# Patient Record
Sex: Female | Born: 1980
Health system: Southern US, Community
[De-identification: ages and names within clinical notes are randomized; demographics above are authoritative.]

## PROBLEM LIST (undated history)

## (undated) ENCOUNTER — Inpatient Hospital Stay (HOSPITAL_COMMUNITY): Payer: Self-pay

## (undated) DIAGNOSIS — T8859XA Other complications of anesthesia, initial encounter: Secondary | ICD-10-CM

## (undated) DIAGNOSIS — R7303 Prediabetes: Secondary | ICD-10-CM

## (undated) DIAGNOSIS — B009 Herpesviral infection, unspecified: Secondary | ICD-10-CM

## (undated) DIAGNOSIS — E559 Vitamin D deficiency, unspecified: Secondary | ICD-10-CM

## (undated) DIAGNOSIS — M1990 Unspecified osteoarthritis, unspecified site: Secondary | ICD-10-CM

## (undated) DIAGNOSIS — F329 Major depressive disorder, single episode, unspecified: Secondary | ICD-10-CM

## (undated) DIAGNOSIS — R059 Cough, unspecified: Secondary | ICD-10-CM

## (undated) DIAGNOSIS — I8390 Asymptomatic varicose veins of unspecified lower extremity: Secondary | ICD-10-CM

## (undated) DIAGNOSIS — F32A Depression, unspecified: Secondary | ICD-10-CM

## (undated) DIAGNOSIS — E739 Lactose intolerance, unspecified: Secondary | ICD-10-CM

## (undated) DIAGNOSIS — G4730 Sleep apnea, unspecified: Secondary | ICD-10-CM

## (undated) DIAGNOSIS — J45909 Unspecified asthma, uncomplicated: Secondary | ICD-10-CM

## (undated) DIAGNOSIS — E282 Polycystic ovarian syndrome: Secondary | ICD-10-CM

## (undated) DIAGNOSIS — IMO0002 Reserved for concepts with insufficient information to code with codable children: Secondary | ICD-10-CM

## (undated) DIAGNOSIS — R5383 Other fatigue: Secondary | ICD-10-CM

## (undated) DIAGNOSIS — F419 Anxiety disorder, unspecified: Secondary | ICD-10-CM

## (undated) DIAGNOSIS — D181 Lymphangioma, any site: Secondary | ICD-10-CM

## (undated) DIAGNOSIS — T4145XA Adverse effect of unspecified anesthetic, initial encounter: Secondary | ICD-10-CM

## (undated) DIAGNOSIS — K219 Gastro-esophageal reflux disease without esophagitis: Secondary | ICD-10-CM

## (undated) HISTORY — DX: Lactose intolerance, unspecified: E73.9

## (undated) HISTORY — DX: Sleep apnea, unspecified: G47.30

## (undated) HISTORY — DX: Prediabetes: R73.03

## (undated) HISTORY — DX: Cough, unspecified: R05.9

## (undated) HISTORY — DX: Major depressive disorder, single episode, unspecified: F32.9

## (undated) HISTORY — DX: Depression, unspecified: F32.A

## (undated) HISTORY — PX: OTHER SURGICAL HISTORY: SHX169

## (undated) HISTORY — DX: Other fatigue: R53.83

## (undated) HISTORY — DX: Vitamin D deficiency, unspecified: E55.9

## (undated) HISTORY — DX: Unspecified asthma, uncomplicated: J45.909

## (undated) HISTORY — DX: Lymphangioma, any site: D18.1

---
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
## 2004-07-14 DIAGNOSIS — IMO0002 Reserved for concepts with insufficient information to code with codable children: Secondary | ICD-10-CM

## 2004-07-14 DIAGNOSIS — R87619 Unspecified abnormal cytological findings in specimens from cervix uteri: Secondary | ICD-10-CM

## 2004-07-14 HISTORY — DX: Reserved for concepts with insufficient information to code with codable children: IMO0002

## 2004-07-14 HISTORY — DX: Unspecified abnormal cytological findings in specimens from cervix uteri: R87.619

## 2004-07-14 HISTORY — PX: LEEP: SHX91

## 2006-12-28 ENCOUNTER — Emergency Department (HOSPITAL_COMMUNITY): Admission: EM | Admit: 2006-12-28 | Discharge: 2006-12-28 | Payer: Self-pay | Admitting: Family Medicine

## 2007-05-04 ENCOUNTER — Ambulatory Visit (HOSPITAL_COMMUNITY): Admission: RE | Admit: 2007-05-04 | Discharge: 2007-05-04 | Payer: Self-pay | Admitting: Obstetrics and Gynecology

## 2007-07-15 HISTORY — PX: HYSTEROSCOPY: SHX211

## 2008-03-03 ENCOUNTER — Ambulatory Visit (HOSPITAL_COMMUNITY): Admission: RE | Admit: 2008-03-03 | Discharge: 2008-03-03 | Payer: Self-pay | Admitting: Obstetrics and Gynecology

## 2008-03-03 ENCOUNTER — Encounter (INDEPENDENT_AMBULATORY_CARE_PROVIDER_SITE_OTHER): Payer: Self-pay | Admitting: Obstetrics and Gynecology

## 2008-07-14 HISTORY — PX: SPHINCTEROTOMY: SHX5279

## 2010-01-30 ENCOUNTER — Inpatient Hospital Stay (HOSPITAL_COMMUNITY): Admission: AD | Admit: 2010-01-30 | Discharge: 2010-01-30 | Payer: Self-pay | Admitting: Obstetrics and Gynecology

## 2010-02-02 ENCOUNTER — Ambulatory Visit (HOSPITAL_COMMUNITY): Admission: AD | Admit: 2010-02-02 | Discharge: 2010-02-02 | Payer: Self-pay | Admitting: Obstetrics and Gynecology

## 2010-02-05 ENCOUNTER — Ambulatory Visit: Payer: Self-pay | Admitting: Obstetrics and Gynecology

## 2010-02-05 ENCOUNTER — Ambulatory Visit (HOSPITAL_COMMUNITY): Admission: RE | Admit: 2010-02-05 | Discharge: 2010-02-05 | Payer: Self-pay | Admitting: Obstetrics and Gynecology

## 2010-02-08 ENCOUNTER — Ambulatory Visit (HOSPITAL_COMMUNITY): Admission: RE | Admit: 2010-02-08 | Discharge: 2010-02-08 | Payer: Self-pay | Admitting: Obstetrics and Gynecology

## 2010-02-11 ENCOUNTER — Ambulatory Visit: Payer: Self-pay | Admitting: Nurse Practitioner

## 2010-02-11 ENCOUNTER — Ambulatory Visit (HOSPITAL_COMMUNITY): Admission: RE | Admit: 2010-02-11 | Discharge: 2010-02-11 | Payer: Self-pay | Admitting: Obstetrics and Gynecology

## 2010-02-18 ENCOUNTER — Ambulatory Visit (HOSPITAL_COMMUNITY): Admission: RE | Admit: 2010-02-18 | Discharge: 2010-02-18 | Payer: Self-pay | Admitting: Obstetrics and Gynecology

## 2010-02-25 ENCOUNTER — Ambulatory Visit: Payer: Self-pay | Admitting: Nurse Practitioner

## 2010-02-25 ENCOUNTER — Inpatient Hospital Stay (HOSPITAL_COMMUNITY): Admission: AD | Admit: 2010-02-25 | Discharge: 2010-02-25 | Payer: Self-pay | Admitting: Obstetrics and Gynecology

## 2010-03-04 ENCOUNTER — Inpatient Hospital Stay (HOSPITAL_COMMUNITY): Admission: AD | Admit: 2010-03-04 | Discharge: 2010-03-04 | Payer: Self-pay | Admitting: Obstetrics and Gynecology

## 2010-03-04 ENCOUNTER — Ambulatory Visit: Payer: Self-pay | Admitting: Nurse Practitioner

## 2010-09-27 LAB — HCG, QUANTITATIVE, PREGNANCY
hCG, Beta Chain, Quant, S: 135 m[IU]/mL — ABNORMAL HIGH (ref <5)
hCG, Beta Chain, Quant, S: 15 m[IU]/mL — ABNORMAL HIGH (ref <5)
hCG, Beta Chain, Quant, S: 2243 m[IU]/mL — ABNORMAL HIGH (ref <5)
hCG, Beta Chain, Quant, S: 455 m[IU]/mL — ABNORMAL HIGH (ref <5)

## 2010-09-28 ENCOUNTER — Emergency Department (HOSPITAL_COMMUNITY)
Admission: EM | Admit: 2010-09-28 | Discharge: 2010-09-28 | Disposition: A | Discharge status: Home / Self Care | Payer: 59 | Attending: Emergency Medicine | Admitting: Emergency Medicine

## 2010-09-28 DIAGNOSIS — M79609 Pain in unspecified limb: Secondary | ICD-10-CM | POA: Insufficient documentation

## 2010-09-28 DIAGNOSIS — L539 Erythematous condition, unspecified: Secondary | ICD-10-CM | POA: Insufficient documentation

## 2010-09-28 LAB — DIFFERENTIAL
Basophils Absolute: 0 10*3/uL (ref 0.0–0.1)
Basophils Absolute: 0 10*3/uL (ref 0.0–0.1)
Eosinophils Absolute: 0.1 10*3/uL (ref 0.0–0.7)
Eosinophils Relative: 1 % (ref 0–5)
Eosinophils Relative: 2 % (ref 0–5)
Lymphocytes Relative: 26 % (ref 12–46)
Lymphs Abs: 2.1 10*3/uL (ref 0.7–4.0)
Monocytes Absolute: 0.3 10*3/uL (ref 0.1–1.0)
Monocytes Absolute: 0.3 10*3/uL (ref 0.1–1.0)
Monocytes Relative: 3 % (ref 3–12)
Monocytes Relative: 5 % (ref 3–12)
Myelocytes: 0 %
Neutro Abs: 3.3 10*3/uL (ref 1.7–7.7)
Neutrophils Relative %: 69 % (ref 43–77)

## 2010-09-28 LAB — CBC
MCH: 30.2 pg (ref 26.0–34.0)
MCH: 31.2 pg (ref 26.0–34.0)
MCV: 92.4 fL (ref 78.0–100.0)
MCV: 93.3 fL (ref 78.0–100.0)
Platelets: 307 10*3/uL (ref 150–400)
Platelets: 313 10*3/uL (ref 150–400)
RBC: 3.81 MIL/uL — ABNORMAL LOW (ref 3.87–5.11)
RBC: 3.96 MIL/uL (ref 3.87–5.11)
WBC: 6.3 10*3/uL (ref 4.0–10.5)
WBC: 8 10*3/uL (ref 4.0–10.5)

## 2010-09-28 LAB — HCG, QUANTITATIVE, PREGNANCY
hCG, Beta Chain, Quant, S: 3422 m[IU]/mL — ABNORMAL HIGH (ref <5)
hCG, Beta Chain, Quant, S: 3506 m[IU]/mL — ABNORMAL HIGH (ref <5)
hCG, Beta Chain, Quant, S: 4339 m[IU]/mL — ABNORMAL HIGH (ref <5)

## 2010-09-28 LAB — CREATININE, SERUM
Creatinine, Ser: 0.62 mg/dL (ref 0.4–1.2)
GFR calc Af Amer: >60 mL/min (ref >60)
GFR calc non Af Amer: >60 mL/min (ref >60)
GFR calc non Af Amer: >60 mL/min (ref >60)

## 2010-09-28 LAB — ABO/RH: ABO/RH(D): A POS

## 2010-09-28 LAB — AST: AST: 12 U/L (ref 0–37)

## 2010-10-01 ENCOUNTER — Emergency Department (HOSPITAL_COMMUNITY)
Admission: EM | Admit: 2010-10-01 | Discharge: 2010-10-01 | Disposition: A | Discharge status: Home / Self Care | Payer: 59 | Attending: Emergency Medicine | Admitting: Emergency Medicine

## 2010-10-01 DIAGNOSIS — L03119 Cellulitis of unspecified part of limb: Secondary | ICD-10-CM | POA: Insufficient documentation

## 2010-10-01 DIAGNOSIS — R509 Fever, unspecified: Secondary | ICD-10-CM | POA: Insufficient documentation

## 2010-10-01 DIAGNOSIS — R21 Rash and other nonspecific skin eruption: Secondary | ICD-10-CM | POA: Insufficient documentation

## 2010-10-01 DIAGNOSIS — L02419 Cutaneous abscess of limb, unspecified: Secondary | ICD-10-CM | POA: Insufficient documentation

## 2010-10-03 LAB — WOUND CULTURE

## 2010-11-26 NOTE — Op Note (Signed)
NAMEFRANCHON, Brenda Williams              ACCOUNT NO.:  [id number]   MEDICAL RECORD NO.:  [id number]          PATIENT TYPE:  AMB   LOCATION:  SDC                           FACILITY:  [facility]   PHYSICIAN:  Miguel Aschoff, M.D.       DATE OF BIRTH:  08-28-1980   DATE OF PROCEDURE:  DATE OF DISCHARGE:                               OPERATIVE REPORT   PREOPERATIVE DIAGNOSES:  Irregular vaginal bleeding, endometrial polyp.   POSTOPERATIVE DIAGNOSES:  Irregular vaginal bleeding, endometrial polyp.   PROCEDURES:  Cervical dilatation, hysteroscopy, removal of endometrial  polyp, uterine curettage.   SURGEON:  Miguel Aschoff, MD   ANESTHESIA:  General.   COMPLICATIONS:  None.   JUSTIFICATION:  The patient is a 30 year old black female patient of Dr.  Teodora Medici who presented for evaluation of irregular vaginal bleeding.  On evaluation, the patient was noted to have a polyp in the uterus and  sonohysterogram and because of the findings, she presents now to undergo  hysteroscopy, D&C, and removal of the endometrial polyp.  The risks and  benefits of the procedure were discussed with the patient and informed  consent has been obtained.   PROCEDURE:  The patient was taken to the operating room, placed in  supine position, general anesthesia was administered without difficulty.  She was then placed in a dorsal lithotomy position, prepped and draped  in the usual sterile fashion.  Bladder was catheterized.  Examination  under anesthesia revealed normal external genitalia, normal Bartholin  and Skene's glands,  normal urethra.  The vaginal vault was without  gross lesion.  The cervix had scarring present due to a prior cervical  LEEP procedure.  The uterus was noted be anterior in normal size and  shape.  No adnexal masses were noted.  At this point, speculum was  placed in the vaginal vault.  Anterior cervical lip was grasped with a  tenaculum and then the cervix was carefully dilated using serial Pratt  dilators until #25 Pratt dilator could be passed and dilated the cervix.  This corrected the stenosis associated with prior LEEP procedure.  Once  the dilatation was complete, the diagnostic hysteroscope was advanced  through the endocervix.  No endocervical lesions were noted.  On  entering the uterine cavity, a polyp was noted to be originating from  the lower left posterior portion of the uterine cavity.  The remainder  of the uterine cavity was unremarkable.  At this point, the hysteroscope  was removed.  Polyp forceps were introduced and the polyp was removed.  The hysteroscope was then reintroduced.  A small portion of the base of  polyp remained and at this point hysteroscope was taken out and  curettage in this area was carried out without difficulty.  This tissue  was collected and sent for histologic study.  Replacement hysteroscope  at this point revealed complete removal of the polyp and no other  abnormalities being noted.  It was elected to complete the procedure.  The hysteroscope was removed.  The cervix was injected with 10 mL of 1%  Xylocaine for analgesia.  The  patient was reversed from the anesthetic  and taken to the recovery room in satisfactory condition.  The fluid  deficit on hysteroscopy was minimal.  The estimated blood loss was less  than 20 mL.   PLAN:  The patient is to be discharged home.   MEDICATIONS:  For home include;  1. Darvocet-N 100 one every 4 hours as needed for pain.  2. Doxycycline 1 twice a day times 3 days.   The patient is call for any problems such as fever, pain, or heavy  bleeding.  She is to call in 1 week for pathology report.  She will be  seen back in 4 weeks for followup examination and then return to the  care of Dr. Chevis Pretty.      Miguel Aschoff, M.D.  Electronically Signed     AR/MEDQ  D:  03/03/2008  T:  03/04/2008  Job:  [id number]   cc:   Brenda Williams, M.D.  Fax: [phone]

## 2011-03-13 ENCOUNTER — Inpatient Hospital Stay (HOSPITAL_COMMUNITY)
Admission: AD | Admit: 2011-03-13 | Discharge: 2011-03-13 | Disposition: A | Discharge status: Home / Self Care | Payer: 59 | Source: Ambulatory Visit | Attending: Obstetrics and Gynecology | Admitting: Obstetrics and Gynecology

## 2011-03-15 ENCOUNTER — Encounter (HOSPITAL_COMMUNITY): Payer: Self-pay

## 2011-03-15 ENCOUNTER — Inpatient Hospital Stay (HOSPITAL_COMMUNITY)
Admission: AD | Admit: 2011-03-15 | Discharge: 2011-03-15 | Disposition: A | Discharge status: Home / Self Care | Payer: 59 | Source: Ambulatory Visit | Attending: Obstetrics and Gynecology | Admitting: Obstetrics and Gynecology

## 2011-03-15 DIAGNOSIS — O34599 Maternal care for other abnormalities of gravid uterus, unspecified trimester: Secondary | ICD-10-CM | POA: Insufficient documentation

## 2011-03-15 DIAGNOSIS — E282 Polycystic ovarian syndrome: Secondary | ICD-10-CM | POA: Insufficient documentation

## 2011-03-15 DIAGNOSIS — O209 Hemorrhage in early pregnancy, unspecified: Secondary | ICD-10-CM

## 2011-03-15 HISTORY — DX: Reserved for concepts with insufficient information to code with codable children: IMO0002

## 2011-03-15 HISTORY — DX: Herpesviral infection, unspecified: B00.9

## 2011-03-15 HISTORY — DX: Polycystic ovarian syndrome: E28.2

## 2011-03-15 LAB — URINALYSIS, ROUTINE W REFLEX MICROSCOPIC
Glucose, UA: NEGATIVE mg/dL
Ketones, ur: NEGATIVE mg/dL
Nitrite: NEGATIVE
Specific Gravity, Urine: 1.025 (ref 1.005–1.030)
Urobilinogen, UA: 0.2 mg/dL (ref 0.0–1.0)
pH: 6 (ref 5.0–8.0)

## 2011-03-15 LAB — WET PREP, GENITAL

## 2011-03-15 NOTE — Progress Notes (Signed)
Found out pregnant 4 days positive home test and confirmed by blood test at Dr. Huel Coventry office, two days later started spotting, LMP 02/13/11, still spotting today, lower back pain, was dark brown today light pinkish spottingl

## 2011-03-15 NOTE — ED Provider Notes (Signed)
Chief Complaint:  Vaginal Bleeding and Back Pain   Brenda Williams is  30 y.o. G2P0010.  Patient's last menstrual period was 02/13/2011.Marland Kitchen  Her pregnancy status is positive.  She presents complaining of Vaginal Bleeding and Back Pain . Onset is described as ongoing and has been present for  1 days. C/O some back pain and lower abdominal "pulling, discomfort"  OB History    Grav Para Term Preterm Abortions TAB SAB Ect Mult Living   2 0 0 0 1 0 0 1 0 0        Past Medical History  Diagnosis Date  . Herpes     never had outbreak. pos per blood, doesn't know which type  . Polycystic ovarian syndrome   . Abnormal Pap smear 2006    leep    Past Surgical History  Procedure Date  . Knee surgery 1998    right knee  . Leep 2006  . Hysteroscopy 2009    uterine polyp  . Sphincterotomy 2010    No family history on file.  History  Substance Use Topics  . Smoking status: Never Smoker   . Smokeless tobacco: Not on file  . Alcohol Use: No    Allergies: No Known Allergies  Prescriptions prior to admission  Medication Sig Dispense Refill  . prenatal vitamin w/FE, FA (PRENATAL 1 + 1) 27-1 MG TABS Take 1 tablet by mouth daily.          Review of Systems - Negative except above  Physical Exam   Blood pressure 118/67, pulse 83, temperature 98.7 F (37.1 C), temperature source Oral, height 5\' 7"  (1.702 m), weight 98.793 kg (217 lb 12.8 oz), last menstrual period 02/13/2011, unknown if currently breastfeeding.  General: General appearance - alert, well appearing, and in no distress Chest - clear to auscultation, no wheezes, rales or rhonchi, symmetric air entry Heart - normal rate and regular rhythm Abdomen - soft, nontender, nondistended, no masses or organomegaly no rebound tenderness noted Focused Gynecological Exam: normal external genitalia, vulva, vagina, cervix; normal appearing discharge.  CX non friable.  Small amt dark red blood in vault.  Mildly tender across lower  abdomen  Labs: Recent Results (from the past 24 hour(s))  URINALYSIS, ROUTINE W REFLEX MICROSCOPIC   Collection Time   03/15/11  3:40 PM      Component Value Range   Color, Urine YELLOW  YELLOW    Appearance CLEAR  CLEAR    Specific Gravity, Urine 1.025  1.005 - 1.030    pH 6.0  5.0 - 8.0    Glucose, UA NEGATIVE  NEGATIVE (mg/dL)   Hgb urine dipstick NEGATIVE  NEGATIVE    Bilirubin Urine NEGATIVE  NEGATIVE    Ketones, ur NEGATIVE  NEGATIVE (mg/dL)   Protein, ur NEGATIVE  NEGATIVE (mg/dL)   Urobilinogen, UA 0.2  0.0 - 1.0 (mg/dL)   Nitrite NEGATIVE  NEGATIVE    Leukocytes, UA NEGATIVE  NEGATIVE   POCT PREGNANCY, URINE   Collection Time   03/15/11  4:18 PM      Component Value Range   Preg Test, Ur POSITIVE     Imaging Studies:  No results found.   Assessment: There is no problem list on file for this patient.   Plan: Dr. Marcelle Overlie given report on pt. Wants to be called with [facility] result  CRESENZO-DISHMAN,FRANCES

## 2011-03-15 NOTE — ED Provider Notes (Signed)
Hcg 85.  Pt states is was 22 a few days ago.  Dr. Marcelle Overlie called.  Pt to f/u Tuesday for Qhcg.  To f/u sooner here if pain increases

## 2011-03-18 LAB — GC/CHLAMYDIA PROBE AMP, GENITAL
Chlamydia, DNA Probe: NEGATIVE
GC Probe Amp, Genital: NEGATIVE

## 2011-05-02 ENCOUNTER — Inpatient Hospital Stay (HOSPITAL_COMMUNITY)
Admission: AD | Admit: 2011-05-02 | Discharge: 2011-05-02 | Disposition: A | Discharge status: Home / Self Care | Payer: BC Managed Care – PPO | Source: Ambulatory Visit | Attending: Obstetrics & Gynecology | Admitting: Obstetrics & Gynecology

## 2011-05-02 ENCOUNTER — Encounter (HOSPITAL_COMMUNITY): Payer: Self-pay

## 2011-05-02 DIAGNOSIS — O26899 Other specified pregnancy related conditions, unspecified trimester: Secondary | ICD-10-CM

## 2011-05-02 DIAGNOSIS — O99891 Other specified diseases and conditions complicating pregnancy: Secondary | ICD-10-CM | POA: Insufficient documentation

## 2011-05-02 DIAGNOSIS — M545 Low back pain, unspecified: Secondary | ICD-10-CM | POA: Insufficient documentation

## 2011-05-02 DIAGNOSIS — M549 Dorsalgia, unspecified: Secondary | ICD-10-CM

## 2011-05-02 LAB — URINALYSIS, ROUTINE W REFLEX MICROSCOPIC
Bilirubin Urine: NEGATIVE
Hgb urine dipstick: NEGATIVE
Nitrite: NEGATIVE
Protein, ur: NEGATIVE mg/dL
Specific Gravity, Urine: 1.025 (ref 1.005–1.030)
Urobilinogen, UA: 0.2 mg/dL (ref 0.0–1.0)

## 2011-05-02 LAB — WET PREP, GENITAL: Clue Cells Wet Prep HPF POC: NONE SEEN

## 2011-05-02 NOTE — ED Provider Notes (Signed)
History     CSN: [id number] Arrival date & time: 05/02/2011  8:13 AM   None     Chief Complaint  Patient presents with  . Back Pain    HPI Brenda Williams is a 30 y.o. female @ [redacted] weeks gestation who presents to MAU for vaginal discharge and low back pain that started last night. She did a lot of walking yesterday before the pain started. Early prenatal care with [facility] but no longer goes there. Had pregnancy test and Bhcg's early due to history of cervical ectopic. Had ultrasound in the office that puts due date at Nov 21, 2011. Taking Reglan for nausea.   Past Medical History  Diagnosis Date  . Herpes     never had outbreak. pos per blood, doesn't know which type  . Polycystic ovarian syndrome   . Abnormal Pap smear 2006    leep    Past Surgical History  Procedure Date  . Knee surgery 1998    right knee  . Leep 2006  . Hysteroscopy 2009    uterine polyp  . Sphincterotomy 2010    No family history on file.  History  Substance Use Topics  . Smoking status: Never Smoker   . Smokeless tobacco: Never Used  . Alcohol Use: No    OB History    Grav Para Term Preterm Abortions TAB SAB Ect Mult Living   2 0 0 0 1 0 0 1 0 0       Review of Systems  Constitutional: Positive for fatigue. Negative for fever, chills and diaphoresis.  HENT: Negative for ear pain, congestion, sore throat, facial swelling, neck pain, neck stiffness, dental problem and sinus pressure.   Eyes: Negative for photophobia, pain and discharge.  Respiratory: Negative for cough, chest tightness and wheezing.   Cardiovascular: Negative.   Gastrointestinal: Positive for nausea. Negative for vomiting, abdominal pain, diarrhea, constipation and abdominal distention.  Genitourinary: Negative for dysuria, frequency, flank pain and difficulty urinating.  Musculoskeletal: Positive for back pain. Negative for myalgias and gait problem.  Skin: Negative for color change and rash.  Neurological:  Negative for dizziness, speech difficulty, weakness, light-headedness, numbness and headaches.  Psychiatric/Behavioral: Negative for confusion and agitation.    Allergies  Review of patient's allergies indicates no known allergies.  Home Medications  No current outpatient prescriptions on file.  BP 109/68  Pulse 70  Temp(Src) 98.7 F (37.1 C) (Oral)  Resp 20  Ht 5\' 7"  (1.702 m)  Wt 225 lb (102.059 kg)  BMI 35.24 kg/m2  SpO2 98%  LMP 02/13/2011  Breastfeeding? Unknown  Physical Exam  Nursing note and vitals reviewed. Constitutional: She is oriented to person, place, and time. She appears well-developed and well-nourished.  HENT:  Head: Normocephalic and atraumatic.  Eyes: EOM are normal.  Neck: Normal range of motion. Neck supple.  Cardiovascular: Normal rate.   Pulmonary/Chest: Effort normal.  Abdominal: Soft. There is no tenderness.       Doppler FHT 150 bpm  Genitourinary:       Creamy discharge vaginal vault. Cervix closed, long, no CMT, no adnexal tenderness, uterus approximately 12 week size.   Musculoskeletal:       Pain in lumbar area with range of motion.   Neurological: She is alert and oriented to person, place, and time. She has normal strength and normal reflexes. No cranial nerve deficit or sensory deficit.       Good grips and equal bilaterally.  Skin: Skin is warm and  dry.   Blood type A positive Results for orders placed during the hospital encounter of 05/02/11 (from the past 24 hour(s))  URINALYSIS, ROUTINE W REFLEX MICROSCOPIC     Status: Normal   Collection Time   05/02/11  8:30 AM      Component Value Range   Color, Urine YELLOW  YELLOW    Appearance CLEAR  CLEAR    Specific Gravity, Urine 1.025  1.005 - 1.030    pH 7.0  5.0 - 8.0    Glucose, UA NEGATIVE  NEGATIVE (mg/dL)   Hgb urine dipstick NEGATIVE  NEGATIVE    Bilirubin Urine NEGATIVE  NEGATIVE    Ketones, ur NEGATIVE  NEGATIVE (mg/dL)   Protein, ur NEGATIVE  NEGATIVE (mg/dL)    Urobilinogen, UA 0.2  0.0 - 1.0 (mg/dL)   Nitrite NEGATIVE  NEGATIVE    Leukocytes, UA NEGATIVE  NEGATIVE   WET PREP, GENITAL     Status: Abnormal   Collection Time   05/02/11  9:54 AM      Component Value Range   Yeast, Wet Prep NONE SEEN  NONE SEEN    Trich, Wet Prep NONE SEEN  NONE SEEN    Clue Cells, Wet Prep NONE SEEN  NONE SEEN    WBC, Wet Prep HPF POC MODERATE (*) NONE SEEN    ED Course  Procedures  Informal ultrasound = active fetus with cardiac activity visualized.   Assessment:  Low back strain    Pregnancy  Plan:   Tylenol, heat, rest    Follow up with [facility] for prenatal care.    MDM          Kerrie Buffalo, NP 05/02/11 1026

## 2011-05-02 NOTE — ED Provider Notes (Signed)
Agree with above note.  LEGGETT,KELLY H. 05/02/2011 10:59 AM

## 2011-05-02 NOTE — Progress Notes (Signed)
Pt states she had a small amount of pink discharge on the tissue after urinating this am. Has a low back ache.

## 2011-05-02 NOTE — Progress Notes (Signed)
Pt states back pain began last pm, denies burning or urgency with voiding. Has had pink vaginal d/c, today was thicker and orange color. Non-odorous.

## 2011-05-03 LAB — GC/CHLAMYDIA PROBE AMP, GENITAL: Chlamydia, DNA Probe: NEGATIVE

## 2011-05-05 ENCOUNTER — Inpatient Hospital Stay (HOSPITAL_COMMUNITY)
Admission: AD | Admit: 2011-05-05 | Discharge: 2011-05-05 | Disposition: A | Discharge status: Home / Self Care | Payer: BC Managed Care – PPO | Source: Ambulatory Visit | Attending: Obstetrics & Gynecology | Admitting: Obstetrics & Gynecology

## 2011-05-05 ENCOUNTER — Encounter (HOSPITAL_COMMUNITY): Payer: Self-pay | Admitting: *Deleted

## 2011-05-05 DIAGNOSIS — O26859 Spotting complicating pregnancy, unspecified trimester: Secondary | ICD-10-CM | POA: Diagnosis present

## 2011-05-05 NOTE — Progress Notes (Signed)
Was here  Fri morning( spotting), everything was fine.  Started having bright red bleeding on Fri,  Brownish on Sat.   Passed a clot on Sat, brownish yesterday and passed another clot this morning.  quartersized.

## 2011-05-05 NOTE — ED Provider Notes (Signed)
History     Chief Complaint  Patient presents with  . Vaginal Bleeding   HPI Seen in MAU for spotting on Thursday, heavier like a period on Friday, passed half dollar sized clot on Saturday, spotting on Sunday, passed another clot this morning, spotting continues.   OB History    Grav Para Term Preterm Abortions TAB SAB Ect Mult Living   2 0 0 0 1 0 0 1 0 0       Past Medical History  Diagnosis Date  . Herpes     never had outbreak. pos per blood, doesn't know which type  . Polycystic ovarian syndrome   . Abnormal Pap smear 2006    leep    Past Surgical History  Procedure Date  . Knee surgery 1998    right knee  . Leep 2006  . Hysteroscopy 2009    uterine polyp  . Sphincterotomy 2010    No family history on file.  History  Substance Use Topics  . Smoking status: Never Smoker   . Smokeless tobacco: Never Used  . Alcohol Use: No    Allergies: No Known Allergies  Prescriptions prior to admission  Medication Sig Dispense Refill  . metoCLOPramide (REGLAN) 10 MG tablet Take 10 mg by mouth daily as needed. Nausea        . prenatal vitamin w/FE, FA (PRENATAL 1 + 1) 27-1 MG TABS Take 1 tablet by mouth daily.       . progesterone (PROMETRIUM) 100 MG capsule Take 100 mg by mouth at bedtime.          Review of Systems  Constitutional: Negative.   Respiratory: Negative.   Cardiovascular: Negative.   Gastrointestinal: Negative for nausea, vomiting, abdominal pain, diarrhea and constipation.  Genitourinary: Negative for dysuria, urgency, frequency, hematuria and flank pain.       Negative  Cramping/contractions, Positive for vaginal bleeding  Musculoskeletal: Negative.   Neurological: Negative.   Psychiatric/Behavioral: Negative.    Physical Exam   Blood pressure 114/63, pulse 89, temperature 99.2 F (37.3 C), temperature source Oral, resp. rate 20, height 5\' 7"  (1.702 m), weight 100.608 kg (221 lb 12.8 oz), last menstrual period 02/13/2011.  Physical Exam    Constitutional: She is oriented to person, place, and time. She appears well-developed and well-nourished. No distress.  Cardiovascular: Normal rate.   Respiratory: Effort normal.  GI: Soft. There is no tenderness.  Genitourinary: Vaginal discharge (brown) found.       Cervix long and closed   Musculoskeletal: Normal range of motion.  Neurological: She is alert and oriented to person, place, and time.  Skin: Skin is warm and dry.  Psychiatric: She has a normal mood and affect.    MAU Course  Procedures  Bedside u/s + FHR  Assessment and Plan  29 y.o. G2P0010 at [redacted]w[redacted]d Spotting Rev'd precautions, has first prenatal visit scheduled next week  Lawerence Dery 05/05/2011, 10:26 AM

## 2011-05-12 ENCOUNTER — Encounter: Payer: Self-pay | Admitting: Obstetrics and Gynecology

## 2011-05-12 DIAGNOSIS — O200 Threatened abortion: Secondary | ICD-10-CM | POA: Insufficient documentation

## 2011-05-12 DIAGNOSIS — E282 Polycystic ovarian syndrome: Secondary | ICD-10-CM | POA: Insufficient documentation

## 2011-05-12 DIAGNOSIS — D259 Leiomyoma of uterus, unspecified: Secondary | ICD-10-CM

## 2011-05-12 DIAGNOSIS — N889 Noninflammatory disorder of cervix uteri, unspecified: Secondary | ICD-10-CM | POA: Insufficient documentation

## 2011-05-12 DIAGNOSIS — N979 Female infertility, unspecified: Secondary | ICD-10-CM | POA: Insufficient documentation

## 2011-05-12 DIAGNOSIS — R110 Nausea: Secondary | ICD-10-CM | POA: Insufficient documentation

## 2011-05-12 DIAGNOSIS — O008 Other ectopic pregnancy without intrauterine pregnancy: Secondary | ICD-10-CM | POA: Insufficient documentation

## 2011-05-15 ENCOUNTER — Other Ambulatory Visit: Payer: Self-pay | Admitting: Obstetrics & Gynecology

## 2011-05-15 ENCOUNTER — Encounter: Payer: Self-pay | Admitting: Obstetrics & Gynecology

## 2011-05-15 ENCOUNTER — Ambulatory Visit (INDEPENDENT_AMBULATORY_CARE_PROVIDER_SITE_OTHER): Payer: BC Managed Care – PPO | Admitting: Obstetrics & Gynecology

## 2011-05-15 VITALS — BP 112/73 | Temp 97.4°F | Wt 223.6 lb

## 2011-05-15 DIAGNOSIS — D259 Leiomyoma of uterus, unspecified: Secondary | ICD-10-CM

## 2011-05-15 DIAGNOSIS — O0990 Supervision of high risk pregnancy, unspecified, unspecified trimester: Secondary | ICD-10-CM

## 2011-05-15 DIAGNOSIS — O26859 Spotting complicating pregnancy, unspecified trimester: Secondary | ICD-10-CM

## 2011-05-15 DIAGNOSIS — N979 Female infertility, unspecified: Secondary | ICD-10-CM

## 2011-05-15 DIAGNOSIS — O008 Other ectopic pregnancy without intrauterine pregnancy: Secondary | ICD-10-CM

## 2011-05-15 LAB — POCT URINALYSIS DIP (DEVICE)
Bilirubin Urine: NEGATIVE
Glucose, UA: NEGATIVE mg/dL
Ketones, ur: NEGATIVE mg/dL
Leukocytes, UA: NEGATIVE
Nitrite: NEGATIVE
pH: 7 (ref 5.0–8.0)

## 2011-05-15 NOTE — Progress Notes (Signed)
Pt states she has had tdap. Does not want flu vaccine.  Early 1 hr gtt today Blood draw due at 955 and OB Panel, HIV, Hgb Electrophoresis

## 2011-05-15 NOTE — Progress Notes (Signed)
U/S Dec. 6, 2012 at 8am scheduled.

## 2011-05-15 NOTE — Progress Notes (Signed)
Needs [place] to hear FH today.  Pt refuses genetic testing.  History of LEEP-Cx--closed, long, good tone.  RTC 3 weeks for cervix check.

## 2011-05-16 LAB — OBSTETRIC PANEL
Eosinophils Absolute: 0 10*3/uL (ref 0.0–0.7)
Hemoglobin: 11.9 g/dL — ABNORMAL LOW (ref 12.0–15.0)
Hepatitis B Surface Ag: NEGATIVE
Lymphocytes Relative: 25 % (ref 12–46)
Lymphs Abs: 1.7 10*3/uL (ref 0.7–4.0)
Monocytes Relative: 5 % (ref 3–12)
Neutro Abs: 4.9 10*3/uL (ref 1.7–7.7)
Neutrophils Relative %: 70 % (ref 43–77)
Platelets: 314 10*3/uL (ref 150–400)
RBC: 3.99 MIL/uL (ref 3.87–5.11)
Rh Type: POSITIVE
Rubella: 15.2 IU/mL — ABNORMAL HIGH
WBC: 7 10*3/uL (ref 4.0–10.5)

## 2011-05-16 LAB — GLUCOSE TOLERANCE, 1 HOUR: Glucose, 1 Hour GTT: 94 mg/dL (ref 70–140)

## 2011-05-19 LAB — HEMOGLOBINOPATHY EVALUATION
Hemoglobin Other: 0 %
Hgb A: 97.1 % (ref 96.8–97.8)
Hgb S Quant: 0 %

## 2011-05-29 ENCOUNTER — Ambulatory Visit (INDEPENDENT_AMBULATORY_CARE_PROVIDER_SITE_OTHER): Payer: BC Managed Care – PPO | Admitting: Obstetrics and Gynecology

## 2011-05-29 ENCOUNTER — Encounter: Payer: Self-pay | Admitting: Obstetrics and Gynecology

## 2011-05-29 DIAGNOSIS — O200 Threatened abortion: Secondary | ICD-10-CM

## 2011-05-29 DIAGNOSIS — O26859 Spotting complicating pregnancy, unspecified trimester: Secondary | ICD-10-CM

## 2011-05-29 DIAGNOSIS — D259 Leiomyoma of uterus, unspecified: Secondary | ICD-10-CM

## 2011-05-29 DIAGNOSIS — O0990 Supervision of high risk pregnancy, unspecified, unspecified trimester: Secondary | ICD-10-CM | POA: Insufficient documentation

## 2011-05-29 DIAGNOSIS — N889 Noninflammatory disorder of cervix uteri, unspecified: Secondary | ICD-10-CM

## 2011-05-29 LAB — POCT URINALYSIS DIP (DEVICE)
Glucose, UA: NEGATIVE mg/dL
Ketones, ur: NEGATIVE mg/dL
Leukocytes, UA: NEGATIVE
Protein, ur: NEGATIVE mg/dL
Specific Gravity, Urine: 1.025 (ref 1.005–1.030)
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
## 2011-05-29 NOTE — Progress Notes (Signed)
1. ? Borderline glu Tolerance (during w/u for PCOS) but early 1 hr glucola here 94. Rec decrease simple sugars 2. Hx LEEP, dilation of stenotic cx, cervical ectopic: [place] 06/19/11 with CL 3,. Hx genital ZOX:[id number] 3rd tri  Discussed mild H/As, nausea. Consider transfer to [facility] if CL normal.

## 2011-05-29 NOTE — Progress Notes (Signed)
Pulse 84. Clear to white vaginal discharge.

## 2011-06-08 ENCOUNTER — Encounter (HOSPITAL_COMMUNITY): Payer: Self-pay | Admitting: Obstetrics and Gynecology

## 2011-06-08 ENCOUNTER — Inpatient Hospital Stay (HOSPITAL_COMMUNITY)
Admission: AD | Admit: 2011-06-08 | Discharge: 2011-06-08 | Disposition: A | Discharge status: Home / Self Care | Payer: BC Managed Care – PPO | Source: Ambulatory Visit | Attending: Obstetrics & Gynecology | Admitting: Obstetrics & Gynecology

## 2011-06-08 DIAGNOSIS — O008 Other ectopic pregnancy without intrauterine pregnancy: Secondary | ICD-10-CM

## 2011-06-08 DIAGNOSIS — M549 Dorsalgia, unspecified: Secondary | ICD-10-CM | POA: Insufficient documentation

## 2011-06-08 DIAGNOSIS — O26899 Other specified pregnancy related conditions, unspecified trimester: Secondary | ICD-10-CM

## 2011-06-08 DIAGNOSIS — E282 Polycystic ovarian syndrome: Secondary | ICD-10-CM

## 2011-06-08 DIAGNOSIS — O99891 Other specified diseases and conditions complicating pregnancy: Secondary | ICD-10-CM | POA: Insufficient documentation

## 2011-06-08 LAB — URINALYSIS, ROUTINE W REFLEX MICROSCOPIC
Ketones, ur: NEGATIVE mg/dL
Leukocytes, UA: NEGATIVE
Nitrite: NEGATIVE
Protein, ur: NEGATIVE mg/dL
pH: 7 (ref 5.0–8.0)

## 2011-06-08 NOTE — Progress Notes (Signed)
Pt reports having lower back pain more on left side since yesterday. Difficult to bend over and move easily.

## 2011-06-08 NOTE — ED Provider Notes (Signed)
History     Chief Complaint  Patient presents with  . Back Pain   HPI Brenda Williams May 30 y.o. 16w 2d gestation.  Comes to MAU with pain in back.  No contractions.  Has not fallen.  Does not recall lifting any heavy items.  Did not take any Tylenol.   Gets care with [facility] hospital clinic.  Next appointment later this week.    OB History    Grav Para Term Preterm Abortions TAB SAB Ect Mult Living   2 0 0 0 1 0 0 1 0 0       Past Medical History  Diagnosis Date  . Herpes     never had outbreak. pos per blood, doesn't know which type  . Polycystic ovarian syndrome   . Abnormal Pap smear 2006    leep    Past Surgical History  Procedure Date  . Knee surgery 1998    right knee  . Leep 2006  . Hysteroscopy 2009    uterine polyp  . Sphincterotomy 2010  . Ivf retrival 2010, 2011    Family History  Problem Relation Age of Onset  . Diabetes Maternal Grandmother   . Heart disease Maternal Grandmother   . Hypertension Maternal Grandfather   . Cancer Maternal Grandfather     bone, colon   . Diabetes Mother   . Cancer Mother     breast cancer    History  Substance Use Topics  . Smoking status: Never Smoker   . Smokeless tobacco: Never Used  . Alcohol Use: No    Allergies: No Known Allergies  Prescriptions prior to admission  Medication Sig Dispense Refill  . calcium carbonate (TUMS - DOSED IN MG ELEMENTAL CALCIUM) 500 MG chewable tablet Chew 1 tablet by mouth daily as needed. For acid  stomach       . metoCLOPramide (REGLAN) 10 MG tablet Take 10 mg by mouth daily as needed. Nausea        . prenatal vitamin w/FE, FA (PRENATAL 1 + 1) 27-1 MG TABS Take 1 tablet by mouth daily.       . progesterone (PROMETRIUM) 100 MG capsule Take 100 mg by mouth at bedtime.         Review of Systems  HENT: Positive for neck pain.    Physical Exam   Blood pressure 106/65, pulse 85, temperature 98.5 F (36.9 C), temperature source Oral, resp. rate 18, height 5\' 7"  (1.702 m),  weight 224 lb 6.4 oz (101.787 kg), last menstrual period 02/14/2011.  Physical Exam  Nursing note and vitals reviewed. Constitutional: She is oriented to person, place, and time. She appears well-developed and well-nourished.  HENT:  Head: Normocephalic.  Eyes: EOM are normal.  Neck: Neck supple.  GI: Soft. There is no tenderness. There is no rebound and no guarding.  Genitourinary:       Speculum exam: Vagina - Small amount of creamy discharge, no odor Cervix - No contact bleeding Bimanual exam: Cervix closed and thick Uterus gravid Adnexa non tender, no masses bilaterally Chaperone present for exam.  Musculoskeletal: Normal range of motion.       Back pain in right low sacral area.  No CVA tenderness  Neurological: She is alert and oriented to person, place, and time.  Skin: Skin is warm and dry.  Psychiatric: She has a normal mood and affect.    MAU Course  Procedures Declines Tylenol here  MDM Results for orders placed during the hospital encounter of 06/08/11 (  from the past 24 hour(s))  URINALYSIS, ROUTINE W REFLEX MICROSCOPIC     Status: Normal   Collection Time   06/08/11 11:15 AM      Component Value Range   Color, Urine YELLOW  YELLOW    Appearance CLEAR  CLEAR    Specific Gravity, Urine 1.020  1.005 - 1.030    pH 7.0  5.0 - 8.0    Glucose, UA NEGATIVE  NEGATIVE (mg/dL)   Hgb urine dipstick NEGATIVE  NEGATIVE    Bilirubin Urine NEGATIVE  NEGATIVE    Ketones, ur NEGATIVE  NEGATIVE (mg/dL)   Protein, ur NEGATIVE  NEGATIVE (mg/dL)   Urobilinogen, UA 0.2  0.0 - 1.0 (mg/dL)   Nitrite NEGATIVE  NEGATIVE    Leukocytes, UA NEGATIVE  NEGATIVE      Assessment and Plan  Musculoskeletal back pain in pregnancy - 16 weeks  Plan Take Tylenol 325 mg 2 tablets by mouth every 4 hours if needed for pain. Keep your appointment on Thursday in [facility] clinic Call if your pain worsens. May use an ice pack for 10 minutes BID.  Brenda Williams 06/08/2011, 12:11 PM   Nolene Bernheim, NP 06/08/11 1316

## 2011-06-08 NOTE — Progress Notes (Signed)
Pt presents to MAU with chief complaint of lower back pain that started last night. Pain is worse on Left side. Pt is [redacted]w[redacted]d, no fever, diarrhea, burning during urination.

## 2011-06-12 ENCOUNTER — Ambulatory Visit (INDEPENDENT_AMBULATORY_CARE_PROVIDER_SITE_OTHER): Payer: BC Managed Care – PPO | Admitting: Obstetrics & Gynecology

## 2011-06-12 DIAGNOSIS — O0990 Supervision of high risk pregnancy, unspecified, unspecified trimester: Secondary | ICD-10-CM

## 2011-06-12 DIAGNOSIS — O200 Threatened abortion: Secondary | ICD-10-CM

## 2011-06-12 LAB — POCT URINALYSIS DIP (DEVICE)
Glucose, UA: NEGATIVE mg/dL
Leukocytes, UA: NEGATIVE
Protein, ur: NEGATIVE mg/dL
Urobilinogen, UA: 0.2 mg/dL (ref 0.0–1.0)

## 2011-06-12 NOTE — Progress Notes (Signed)
Declined genetic testing. Breast feeding Considering OCPs pp Anatomy US scheuled Dec 6th

## 2011-06-12 NOTE — Patient Instructions (Signed)
Breastfeeding BENEFITS OF BREASTFEEDING For the baby  The first milk (colostrum) helps the baby's digestive system function better.   There are antibodies from the mother in the milk that help the baby fight off infections.   The baby has a lower incidence of asthma, allergies, and SIDS (sudden infant death syndrome).   The nutrients in breast milk are better than formulas for the baby and helps the baby's brain grow better.   Babies who breastfeed have less gas, colic, and constipation.  For the mother  Breastfeeding helps develop a very special bond between mother and baby.   It is more convenient, always available at the correct temperature and cheaper than formula feeding.   It burns calories in the mother and helps with losing weight that was gained during pregnancy.   It makes the uterus contract back down to normal size faster and slows bleeding following delivery.   Breastfeeding mothers have a lower risk of developing breast cancer.  NURSE FREQUENTLY  A healthy, full-term baby may breastfeed as often as every hour or space his or her feedings to every 3 hours.   How often to nurse will vary from baby to baby. Watch your baby for signs of hunger, not the clock.   Nurse as often as the baby requests, or when you feel the need to reduce the fullness of your breasts.   Awaken the baby if it has been 3 to 4 hours since the last feeding.   Frequent feeding will help the mother make more milk and will prevent problems like sore nipples and engorgement of the breasts.  BABY'S POSITION AT THE BREAST  Whether lying down or sitting, be sure that the baby's tummy is facing your tummy.   Support the breast with 4 fingers underneath the breast and the thumb above. Make sure your fingers are well away from the nipple and baby's mouth.   Stroke the baby's lips and cheek closest to the breast gently with your finger or nipple.   When the baby's mouth is open wide enough, place  all of your nipple and as much of the dark area around the nipple as possible into your baby's mouth.   Pull the baby in close so the tip of the nose and the baby's cheeks touch the breast during the feeding.  FEEDINGS  The length of each feeding varies from baby to baby and from feeding to feeding.   The baby must suck about 2 to 3 minutes for your milk to get to him or her. This is called a "let down." For this reason, allow the baby to feed on each breast as long as he or she wants. Your baby will end the feeding when he or she has received the right balance of nutrients.   To break the suction, put your finger into the corner of the baby's mouth and slide it between his or her gums before removing your breast from his or her mouth. This will help prevent sore nipples.  REDUCING BREAST ENGORGEMENT  In the first week after your baby is born, you may experience signs of breast engorgement. When breasts are engorged, they feel heavy, warm, full, and may be tender to the touch. You can reduce engorgement if you:   Nurse frequently, every 2 to 3 hours. Mothers who breastfeed early and often have fewer problems with engorgement.   Place light ice packs on your breasts between feedings. This reduces swelling. Wrap the ice packs in a   lightweight towel to protect your skin.   Apply moist hot packs to your breast for 5 to 10 minutes before each feeding. This increases circulation and helps the milk flow.   Gently massage your breast before and during the feeding.   Make sure that the baby empties at least one breast at every feeding before switching sides.   Use a breast pump to empty the breasts if your baby is sleepy or not nursing well. You may also want to pump if you are returning to work or or you feel you are getting engorged.   Avoid bottle feeds, pacifiers or supplemental feedings of water or juice in place of breastfeeding.   Be sure the baby is latched on and positioned properly while  breastfeeding.   Prevent fatigue, stress, and anemia.   Wear a supportive bra, avoiding underwire styles.   Eat a balanced diet with enough fluids.  If you follow these suggestions, your engorgement should improve in 24 to 48 hours. If you are still experiencing difficulty, call your lactation consultant or caregiver. IS MY BABY GETTING ENOUGH MILK? Sometimes, mothers worry about whether their babies are getting enough milk. You can be assured that your baby is getting enough milk if:  The baby is actively sucking and you hear swallowing.   The baby nurses at least 8 to 12 times in a 24 hour time period. Nurse your baby until he or she unlatches or falls asleep at the first breast (at least 10 to 20 minutes), then offer the second side.   The baby is wetting 5 to 6 disposable diapers (6 to 8 cloth diapers) in a 24 hour period by 5 to 6 days of age.   The baby is having at least 2 to 3 stools every 24 hours for the first few months. Breast milk is all the food your baby needs. It is not necessary for your baby to have water or formula. In fact, to help your breasts make more milk, it is best not to give your baby supplemental feedings during the early weeks.   The stool should be soft and yellow.   The baby should gain 4 to 7 ounces per week after he is 4 days old.  TAKE CARE OF YOURSELF Take care of your breasts by:  Bathing or showering daily.   Avoiding the use of soaps on your nipples.   Start feedings on your left breast at one feeding and on your right breast at the next feeding.   You will notice an increase in your milk supply 2 to 5 days after delivery. You may feel some discomfort from engorgement, which makes your breasts very firm and often tender. Engorgement "peaks" out within 24 to 48 hours. In the meantime, apply warm moist towels to your breasts for 5 to 10 minutes before feeding. Gentle massage and expression of some milk before feeding will soften your breasts, making  it easier for your baby to latch on. Wear a well fitting nursing bra and air dry your nipples for 10 to 15 minutes after each feeding.   Only use cotton bra pads.   Only use pure lanolin on your nipples after nursing. You do not need to wash it off before nursing.  Take care of yourself by:   Eating well-balanced meals and nutritious snacks.   Drinking milk, fruit juice, and water to satisfy your thirst (about 8 glasses a day).   Getting plenty of rest.   Increasing calcium in   your diet (1200 mg a day).   Avoiding foods that you notice affect the baby in a bad way.  SEEK MEDICAL CARE IF:   You have any questions or difficulty with breastfeeding.   You need help.   You have a hard, red, sore area on your breast, accompanied by a fever of 100.5 F (38.1 C) or more.   Your baby is too sleepy to eat well or is having trouble sleeping.   Your baby is wetting less than 6 diapers per day, by 5 days of age.   Your baby's skin or white part of his or her eyes is more yellow than it was in the hospital.   You feel depressed.  Document Released: 06/30/2005 Document Revised: 03/12/2011 Document Reviewed: 02/12/2009 [employer] Patient Information 2012 [employer]. 

## 2011-06-19 ENCOUNTER — Other Ambulatory Visit: Payer: Self-pay | Admitting: Obstetrics & Gynecology

## 2011-06-19 ENCOUNTER — Ambulatory Visit (HOSPITAL_COMMUNITY)
Admission: RE | Admit: 2011-06-19 | Discharge: 2011-06-19 | Disposition: A | Discharge status: Home / Self Care | Payer: BC Managed Care – PPO | Source: Ambulatory Visit | Attending: Obstetrics & Gynecology | Admitting: Obstetrics & Gynecology

## 2011-06-19 DIAGNOSIS — Z1389 Encounter for screening for other disorder: Secondary | ICD-10-CM | POA: Insufficient documentation

## 2011-06-19 DIAGNOSIS — Z363 Encounter for antenatal screening for malformations: Secondary | ICD-10-CM | POA: Insufficient documentation

## 2011-06-19 DIAGNOSIS — O0990 Supervision of high risk pregnancy, unspecified, unspecified trimester: Secondary | ICD-10-CM

## 2011-06-19 DIAGNOSIS — O358XX0 Maternal care for other (suspected) fetal abnormality and damage, not applicable or unspecified: Secondary | ICD-10-CM | POA: Insufficient documentation

## 2011-06-19 DIAGNOSIS — O09299 Supervision of pregnancy with other poor reproductive or obstetric history, unspecified trimester: Secondary | ICD-10-CM | POA: Insufficient documentation

## 2011-06-25 ENCOUNTER — Telehealth: Payer: Self-pay | Admitting: *Deleted

## 2011-06-25 ENCOUNTER — Encounter: Payer: Self-pay | Admitting: Obstetrics & Gynecology

## 2011-06-25 DIAGNOSIS — O4420 Partial placenta previa NOS or without hemorrhage, unspecified trimester: Secondary | ICD-10-CM

## 2011-06-25 NOTE — Telephone Encounter (Signed)
Called pt and discussed [place] results and recommendation of vaginal rest.  Our call got cut off and I called her back 3 times with the same result that in the middle of the conversation, the call would get dropped.  I will call pt back tomorrow and confirm her understanding.

## 2011-06-25 NOTE — Telephone Encounter (Signed)
Message copied by Jill Side on Wed Jun 25, 2011  5:00 PM ------      Message from: Lesly Dukes      Created: Wed Jun 25, 2011 10:42 AM       Pt has posterior marginal previa.  Vaginal rest with f/u US at 28 weeks to assess placenta            Call pt and tell her about vag rest

## 2011-06-26 NOTE — Telephone Encounter (Signed)
Spoke w/pt regarding yesterday's discussion. She stated that she did not have any questions. She was concerned about the timing of her next appt because of her [place] the same day.  Clinic appt changed to 0930. Pt voiced understanding.

## 2011-07-02 ENCOUNTER — Ambulatory Visit: Payer: BC Managed Care – PPO

## 2011-07-02 NOTE — Progress Notes (Signed)
Addended by: Darrel Hoover on: 07/02/2011 03:15 PM   Modules accepted: Orders

## 2011-07-03 ENCOUNTER — Other Ambulatory Visit: Payer: Self-pay | Admitting: *Deleted

## 2011-07-03 ENCOUNTER — Ambulatory Visit (HOSPITAL_COMMUNITY)
Admission: RE | Admit: 2011-07-03 | Discharge: 2011-07-03 | Disposition: A | Discharge status: Home / Self Care | Payer: BC Managed Care – PPO | Source: Ambulatory Visit | Attending: Obstetrics & Gynecology | Admitting: Obstetrics & Gynecology

## 2011-07-03 ENCOUNTER — Other Ambulatory Visit: Payer: Self-pay | Admitting: Obstetrics & Gynecology

## 2011-07-03 ENCOUNTER — Encounter: Payer: Self-pay | Admitting: Family

## 2011-07-03 ENCOUNTER — Ambulatory Visit (INDEPENDENT_AMBULATORY_CARE_PROVIDER_SITE_OTHER): Payer: Medicaid Other | Admitting: Family

## 2011-07-03 VITALS — BP 107/70 | Temp 97.7°F | Wt 225.3 lb

## 2011-07-03 DIAGNOSIS — O26859 Spotting complicating pregnancy, unspecified trimester: Secondary | ICD-10-CM

## 2011-07-03 DIAGNOSIS — O09299 Supervision of pregnancy with other poor reproductive or obstetric history, unspecified trimester: Secondary | ICD-10-CM | POA: Insufficient documentation

## 2011-07-03 DIAGNOSIS — Z23 Encounter for immunization: Secondary | ICD-10-CM

## 2011-07-03 DIAGNOSIS — Z3689 Encounter for other specified antenatal screening: Secondary | ICD-10-CM | POA: Insufficient documentation

## 2011-07-03 LAB — POCT URINALYSIS DIP (DEVICE)
Bilirubin Urine: NEGATIVE
Ketones, ur: NEGATIVE mg/dL
Leukocytes, UA: NEGATIVE
Protein, ur: NEGATIVE mg/dL
Specific Gravity, Urine: <=1.005 (ref 1.005–1.030)
pH: 6 (ref 5.0–8.0)

## 2011-07-03 MED ORDER — INFLUENZA VIRUS VACC SPLIT PF IM SUSP
0.5000 mL | Freq: Once | INTRAMUSCULAR | Status: AC
  Administered 2011-07-03: 0.5 mL via INTRAMUSCULAR

## 2011-07-03 NOTE — Progress Notes (Signed)
Pt here with no reports of vaginal bleeding; no questions or concerns; reviewed ultrasound (poor visualization of heart) order rescan in two weeks;  Urine glucose 250 > CBG 81  ; flu vaccine today.

## 2011-07-03 NOTE — Progress Notes (Signed)
° ° °  U/S scheduled 07/17/11 at 945 am.

## 2011-07-03 NOTE — Progress Notes (Signed)
Pulse- 90 Pt given Tdap vaccine info.

## 2011-07-15 ENCOUNTER — Encounter: Payer: Self-pay | Admitting: Obstetrics & Gynecology

## 2011-07-15 NOTE — L&D Delivery Note (Addendum)
Delivery Note At 9:44 PM a viable and healthy female was delivered via Vaginal, Vacuum (Extractor) (Presentation: Left Occiput Anterior) after manual rotation and a single pull on the vacuum.  APGAR:9/9; 7lbs 5 ozweight .   Placenta status: Intact, Manual removal.  Cord:  3 vessels  Anesthesia: Epidural  Episiotomy: None Lacerations: 2nd degree perineal Suture Repair: 3.0 chromic Est. Blood Loss (mL): 1000 ml:  Post partum hemorrhage, treated with 800 mcg of cytotec per rectum, uterine massage and blunt curettage which revealed retained membranes, 0.2 methergine IV.  Patient developed tachycardia and hypotension.   At this time bleeding is minimal and her blood pressure is back to 109/45.  I will not take patient back to OR at this time but will observe closely.  Have type and crossed for 2 units of PRBC and will check hemoglobin.  Mom to postpartum if bleeding remains normal.  Baby to nursery-stable.  Joeziah Voit D 11/26/2011, 10:45 PM

## 2011-07-17 ENCOUNTER — Ambulatory Visit (HOSPITAL_COMMUNITY)
Admission: RE | Admit: 2011-07-17 | Discharge: 2011-07-17 | Disposition: A | Discharge status: Home / Self Care | Payer: BC Managed Care – PPO | Source: Ambulatory Visit | Attending: Family | Admitting: Family

## 2011-07-17 ENCOUNTER — Encounter: Payer: Self-pay | Admitting: Family

## 2011-07-17 DIAGNOSIS — O09299 Supervision of pregnancy with other poor reproductive or obstetric history, unspecified trimester: Secondary | ICD-10-CM | POA: Insufficient documentation

## 2011-07-17 DIAGNOSIS — Z3689 Encounter for other specified antenatal screening: Secondary | ICD-10-CM | POA: Insufficient documentation

## 2011-07-17 DIAGNOSIS — O26859 Spotting complicating pregnancy, unspecified trimester: Secondary | ICD-10-CM

## 2011-07-24 ENCOUNTER — Other Ambulatory Visit: Payer: Self-pay | Admitting: Obstetrics & Gynecology

## 2011-07-24 ENCOUNTER — Ambulatory Visit (INDEPENDENT_AMBULATORY_CARE_PROVIDER_SITE_OTHER): Payer: Medicaid Other | Admitting: Physician Assistant

## 2011-07-24 DIAGNOSIS — N889 Noninflammatory disorder of cervix uteri, unspecified: Secondary | ICD-10-CM

## 2011-07-24 LAB — POCT URINALYSIS DIP (DEVICE)
Bilirubin Urine: NEGATIVE
Ketones, ur: NEGATIVE mg/dL
Leukocytes, UA: NEGATIVE
Protein, ur: NEGATIVE mg/dL
Specific Gravity, Urine: 1.025 (ref 1.005–1.030)
pH: 7 (ref 5.0–8.0)

## 2011-07-24 NOTE — Progress Notes (Signed)
No complaints. + FM. Denies blding, LOF or abd cramping. Needs FU [place] scheduled at next visit to re-eval previa

## 2011-07-24 NOTE — Patient Instructions (Signed)
Pregnancy - Second Trimester The second trimester of pregnancy (3 to 6 months) is a period of rapid growth for you and your baby. At the end of the sixth month, your baby is about 9 inches long and weighs 1 1/2 pounds. You will begin to feel the baby move between 18 and 20 weeks of the pregnancy. This is called quickening. Weight gain is faster. A clear fluid (colostrum) may leak out of your breasts. You may feel small contractions of the womb (uterus). This is known as false labor or Braxton-Hicks contractions. This is like a practice for labor when the baby is ready to be born. Usually, the problems with morning sickness have usually passed by the end of your first trimester. Some women develop small dark blotches (called cholasma, mask of pregnancy) on their face that usually goes away after the baby is born. Exposure to the sun makes the blotches worse. Acne may also develop in some pregnant women and pregnant women who have acne, may find that it goes away. PRENATAL EXAMS  Blood work may continue to be done during prenatal exams. These tests are done to check on your health and the probable health of your baby. Blood work is used to follow your blood levels (hemoglobin). Anemia (low hemoglobin) is common during pregnancy. Iron and vitamins are given to help prevent this. You will also be checked for diabetes between 24 and 28 weeks of the pregnancy. Some of the previous blood tests may be repeated.   The size of the uterus is measured during each visit. This is to make sure that the baby is continuing to grow properly according to the dates of the pregnancy.   Your blood pressure is checked every prenatal visit. This is to make sure you are not getting toxemia.   Your urine is checked to make sure you do not have an infection, diabetes or protein in the urine.   Your weight is checked often to make sure gains are happening at the suggested rate. This is to ensure that both you and your baby are  growing normally.   Sometimes, an ultrasound is performed to confirm the proper growth and development of the baby. This is a test which bounces harmless sound waves off the baby so your caregiver can more accurately determine due dates.  Sometimes, a specialized test is done on the amniotic fluid surrounding the baby. This test is called an amniocentesis. The amniotic fluid is obtained by sticking a needle into the belly (abdomen). This is done to check the chromosomes in instances where there is a concern about possible genetic problems with the baby. It is also sometimes done near the end of pregnancy if an early delivery is required. In this case, it is done to help make sure the baby's lungs are mature enough for the baby to live outside of the womb. CHANGES OCCURING IN THE SECOND TRIMESTER OF PREGNANCY Your body goes through many changes during pregnancy. They vary from person to person. Talk to your caregiver about changes you notice that you are concerned about.  During the second trimester, you will likely have an increase in your appetite. It is normal to have cravings for certain foods. This varies from person to person and pregnancy to pregnancy.   Your lower abdomen will begin to bulge.   You may have to urinate more often because the uterus and baby are pressing on your bladder. It is also common to get more bladder infections during pregnancy (  pain with urination). You can help this by drinking lots of fluids and emptying your bladder before and after intercourse.   You may begin to get stretch marks on your hips, abdomen, and breasts. These are normal changes in the body during pregnancy. There are no exercises or medications to take that prevent this change.   You may begin to develop swollen and bulging veins (varicose veins) in your legs. Wearing support hose, elevating your feet for 15 minutes, 3 to 4 times a day and limiting salt in your diet helps lessen the problem.    Heartburn may develop as the uterus grows and pushes up against the stomach. Antacids recommended by your caregiver helps with this problem. Also, eating smaller meals 4 to 5 times a day helps.   Constipation can be treated with a stool softener or adding bulk to your diet. Drinking lots of fluids, vegetables, fruits, and whole grains are helpful.   Exercising is also helpful. If you have been very active up until your pregnancy, most of these activities can be continued during your pregnancy. If you have been less active, it is helpful to start an exercise program such as walking.   Hemorrhoids (varicose veins in the rectum) may develop at the end of the second trimester. Warm sitz baths and hemorrhoid cream recommended by your caregiver helps hemorrhoid problems.   Backaches may develop during this time of your pregnancy. Avoid heavy lifting, wear low heal shoes and practice good posture to help with backache problems.   Some pregnant women develop tingling and numbness of their hand and fingers because of swelling and tightening of ligaments in the wrist (carpel tunnel syndrome). This goes away after the baby is born.   As your breasts enlarge, you may have to get a bigger bra. Get a comfortable, cotton, support bra. Do not get a nursing bra until the last month of the pregnancy if you will be nursing the baby.   You may get a dark line from your belly button to the pubic area called the linea nigra.   You may develop rosy cheeks because of increase blood flow to the face.   You may develop spider looking lines of the face, neck, arms and chest. These go away after the baby is born.  HOME CARE INSTRUCTIONS   It is extremely important to avoid all smoking, herbs, alcohol, and unprescribed drugs during your pregnancy. These chemicals affect the formation and growth of the baby. Avoid these chemicals throughout the pregnancy to ensure the delivery of a healthy infant.   Most of your home  care instructions are the same as suggested for the first trimester of your pregnancy. Keep your caregiver's appointments. Follow your caregiver's instructions regarding medication use, exercise and diet.   During pregnancy, you are providing food for you and your baby. Continue to eat regular, well-balanced meals. Choose foods such as meat, fish, milk and other low fat dairy products, vegetables, fruits, and whole-grain breads and cereals. Your caregiver will tell you of the ideal weight gain.   A physical sexual relationship may be continued up until near the end of pregnancy if there are no other problems. Problems could include early (premature) leaking of amniotic fluid from the membranes, vaginal bleeding, abdominal pain, or other medical or pregnancy problems.   Exercise regularly if there are no restrictions. Check with your caregiver if you are unsure of the safety of some of your exercises. The greatest weight gain will occur in the   last 2 trimesters of pregnancy. Exercise will help you:   Control your weight.   Get you in shape for labor and delivery.   Lose weight after you have the baby.   Wear a good support or jogging bra for breast tenderness during pregnancy. This may help if worn during sleep. Pads or tissues may be used in the bra if you are leaking colostrum.   Do not use hot tubs, steam rooms or saunas throughout the pregnancy.   Wear your seat belt at all times when driving. This protects you and your baby if you are in an accident.   Avoid raw meat, uncooked cheese, cat litter boxes and soil used by cats. These carry germs that can cause birth defects in the baby.   The second trimester is also a good time to visit your dentist for your dental health if this has not been done yet. Getting your teeth cleaned is OK. Use a soft toothbrush. Brush gently during pregnancy.   It is easier to loose urine during pregnancy. Tightening up and strengthening the pelvic muscles will  help with this problem. Practice stopping your urination while you are going to the bathroom. These are the same muscles you need to strengthen. It is also the muscles you would use as if you were trying to stop from passing gas. You can practice tightening these muscles up 10 times a set and repeating this about 3 times per day. Once you know what muscles to tighten up, do not perform these exercises during urination. It is more likely to contribute to an infection by backing up the urine.   Ask for help if you have financial, counseling or nutritional needs during pregnancy. Your caregiver will be able to offer counseling for these needs as well as refer you for other special needs.   Your skin may become oily. If so, wash your face with mild soap, use non-greasy moisturizer and oil or cream based makeup.  MEDICATIONS AND DRUG USE IN PREGNANCY  Take prenatal vitamins as directed. The vitamin should contain 1 milligram of folic acid. Keep all vitamins out of reach of children. Only a couple vitamins or tablets containing iron may be fatal to a baby or young child when ingested.   Avoid use of all medications, including herbs, over-the-counter medications, not prescribed or suggested by your caregiver. Only take over-the-counter or prescription medicines for pain, discomfort, or fever as directed by your caregiver. Do not use aspirin.   Let your caregiver also know about herbs you may be using.   Alcohol is related to a number of birth defects. This includes fetal alcohol syndrome. All alcohol, in any form, should be avoided completely. Smoking will cause low birth rate and premature babies.   Street or illegal drugs are very harmful to the baby. They are absolutely forbidden. A baby born to an addicted mother will be addicted at birth. The baby will go through the same withdrawal an adult does.  SEEK MEDICAL CARE IF:  You have any concerns or worries during your pregnancy. It is better to call with  your questions if you feel they cannot wait, rather than worry about them. SEEK IMMEDIATE MEDICAL CARE IF:   An unexplained oral temperature above 102 F (38.9 C) develops, or as your caregiver suggests.   You have leaking of fluid from the vagina (birth canal). If leaking membranes are suspected, take your temperature and tell your caregiver of this when you call.   There   is vaginal spotting, bleeding, or passing clots. Tell your caregiver of the amount and how many pads are used. Light spotting in pregnancy is common, especially following intercourse.   You develop a bad smelling vaginal discharge with a change in the color from clear to white.   You continue to feel sick to your stomach (nauseated) and have no relief from remedies suggested. You vomit blood or coffee ground-like materials.   You lose more than 2 pounds of weight or gain more than 2 pounds of weight over 1 week, or as suggested by your caregiver.   You notice swelling of your face, hands, feet, or legs.   You get exposed to German measles and have never had them.   You are exposed to fifth disease or chickenpox.   You develop belly (abdominal) pain. Round ligament discomfort is a common non-cancerous (benign) cause of abdominal pain in pregnancy. Your caregiver still must evaluate you.   You develop a bad headache that does not go away.   You develop fever, diarrhea, pain with urination, or shortness of breath.   You develop visual problems, blurry, or double vision.   You fall or are in a car accident or any kind of trauma.   There is mental or physical violence at home.  Document Released: 06/24/2001 Document Revised: 03/12/2011 Document Reviewed: 12/27/2008 [employer] Patient Information 2012 [employer]. 

## 2011-08-14 ENCOUNTER — Inpatient Hospital Stay (HOSPITAL_COMMUNITY)
Admission: AD | Admit: 2011-08-14 | Discharge: 2011-08-14 | Disposition: A | Discharge status: Home / Self Care | Payer: Medicaid Other | Source: Ambulatory Visit | Attending: Obstetrics and Gynecology | Admitting: Obstetrics and Gynecology

## 2011-08-14 ENCOUNTER — Encounter (HOSPITAL_COMMUNITY): Payer: Self-pay

## 2011-08-14 DIAGNOSIS — E860 Dehydration: Secondary | ICD-10-CM | POA: Insufficient documentation

## 2011-08-14 DIAGNOSIS — O008 Other ectopic pregnancy without intrauterine pregnancy: Secondary | ICD-10-CM

## 2011-08-14 DIAGNOSIS — R0602 Shortness of breath: Secondary | ICD-10-CM | POA: Insufficient documentation

## 2011-08-14 DIAGNOSIS — O99891 Other specified diseases and conditions complicating pregnancy: Secondary | ICD-10-CM | POA: Insufficient documentation

## 2011-08-14 DIAGNOSIS — E282 Polycystic ovarian syndrome: Secondary | ICD-10-CM

## 2011-08-14 DIAGNOSIS — R42 Dizziness and giddiness: Secondary | ICD-10-CM | POA: Insufficient documentation

## 2011-08-14 LAB — URINE MICROSCOPIC-ADD ON

## 2011-08-14 LAB — URINALYSIS, ROUTINE W REFLEX MICROSCOPIC
Bilirubin Urine: NEGATIVE
Glucose, UA: >1000 mg/dL — AB
Ketones, ur: NEGATIVE mg/dL
Leukocytes, UA: NEGATIVE
Nitrite: NEGATIVE
Specific Gravity, Urine: >1.03 — ABNORMAL HIGH (ref 1.005–1.030)
pH: 6 (ref 5.0–8.0)

## 2011-08-14 NOTE — Progress Notes (Signed)
Patient is here with c/o feeling faint. She states that she was walking around in a store and felt very dizzy. She denies any pain, discomfort, vaginal bleeding, lof or discharge. She reports good fetal movement. She goes to the high risk clinic due to history of cervical ectopic pregnancy, cervical dysplasia and marginal placenta previa

## 2011-08-14 NOTE — Progress Notes (Signed)
Patient state she was out shopping and walking. Started having difficulty breathing and felt dizzy. Was instructed to come to [facility]. Denies bleeding, pain or leaking and reports good fetal movement.

## 2011-08-14 NOTE — ED Provider Notes (Signed)
History    G1 at 25.6 weeks presents with episode while shopping today of dizziness and SOB which resolved when resting.  She reports drinking 1-2 cups of sweet tea but little water today.  She denies LOF, vaginal bleeding, cramping/contractions, dysuria, or urinary frequency.  She reports good fetal movement.   Chief Complaint  Patient presents with  . Dizziness   HPI  OB History    Grav Para Term Preterm Abortions TAB SAB Ect Mult Living   2 0 0 0 1 0 0 1 0 0       Past Medical History  Diagnosis Date  . Herpes     never had outbreak. pos per blood, doesn't know which type  . Polycystic ovarian syndrome   . Abnormal Pap smear 2006    leep    Past Surgical History  Procedure Date  . Knee surgery 1998    right knee  . Leep 2006  . Hysteroscopy 2009    uterine polyp  . Sphincterotomy 2010  . Ivf retrival 2010, 2011    Family History  Problem Relation Age of Onset  . Diabetes Maternal Grandmother   . Heart disease Maternal Grandmother   . Hypertension Maternal Grandfather   . Cancer Maternal Grandfather     bone, colon   . Diabetes Mother   . Cancer Mother     breast cancer    History  Substance Use Topics  . Smoking status: Never Smoker   . Smokeless tobacco: Never Used  . Alcohol Use: No    Allergies: No Known Allergies  Prescriptions prior to admission  Medication Sig Dispense Refill  . prenatal vitamin w/FE, FA (PRENATAL 1 + 1) 27-1 MG TABS Take 1 tablet by mouth daily.       . progesterone (PROMETRIUM) 100 MG capsule Take 100 mg by mouth at bedtime.         Review of Systems  Constitutional: Negative.   HENT: Negative.   Eyes: Negative.   Respiratory: Positive for shortness of breath.   Cardiovascular: Negative.   Gastrointestinal: Negative.   Genitourinary: Negative.   Musculoskeletal: Negative.   Skin: Negative.   Neurological: Positive for dizziness.  Endo/Heme/Allergies: Negative.   Psychiatric/Behavioral: Negative.    Physical Exam     Blood pressure 118/73, pulse 104, temperature 98.7 F (37.1 C), temperature source Oral, resp. rate 20, height 5\' 7"  (1.702 m), weight 104.872 kg (231 lb 3.2 oz), last menstrual period 02/14/2011, SpO2 98.00%.  Physical Exam  Constitutional: She is oriented to person, place, and time. She appears well-developed and well-nourished.  Neck: Normal range of motion.  Cardiovascular: Normal rate, regular rhythm, normal heart sounds and intact distal pulses.   Respiratory: Effort normal and breath sounds normal.  GI: Soft.  Musculoskeletal: Normal range of motion.  Neurological: She is alert and oriented to person, place, and time.  Skin: Skin is warm and dry.  Psychiatric: She has a normal mood and affect. Her behavior is normal. Judgment and thought content normal.   Negative for signs of DVT   Filed Vitals:   08/14/11 1840 08/14/11 1841 08/14/11 1842 08/14/11 1843  BP: 115/69 113/70  106/60  Pulse: 96 92 104 101  Temp:      TempSrc:      Resp: 18 20  20   Height:      Weight:      SpO2: 96% 97% 93% 95%   Results for orders placed during the hospital encounter of 08/14/11 (from the past  24 hour(s))  URINALYSIS, ROUTINE W REFLEX MICROSCOPIC     Status: Abnormal   Collection Time   08/14/11  6:00 PM      Component Value Range   Color, Urine YELLOW  YELLOW    APPearance CLEAR  CLEAR    Specific Gravity, Urine >1.030 (*) 1.005 - 1.030    pH 6.0  5.0 - 8.0    Glucose, UA >1000 (*) NEGATIVE (mg/dL)   Hgb urine dipstick NEGATIVE  NEGATIVE    Bilirubin Urine NEGATIVE  NEGATIVE    Ketones, ur NEGATIVE  NEGATIVE (mg/dL)   Protein, ur NEGATIVE  NEGATIVE (mg/dL)   Urobilinogen, UA 1.0  0.0 - 1.0 (mg/dL)   Nitrite NEGATIVE  NEGATIVE    Leukocytes, UA NEGATIVE  NEGATIVE   URINE MICROSCOPIC-ADD ON     Status: Abnormal   Collection Time   08/14/11  6:00 PM      Component Value Range   Squamous Epithelial / LPF FEW (*) RARE    WBC, UA 0-2  <3 (WBC/hpf)   RBC / HPF 0-2  <3 (RBC/hpf)    Bacteria, UA FEW (*) RARE    Urine-Other MUCOUS PRESENT     MAU Course  Procedures U/A Orthostatic BPs  MDM PO fluids given in MAU  Assessment and Plan  A: Dehydration  P: D/C home Encourage PO hydration, limiting caffeine/tea F/U at [facility] visit next week Return to [facility]U with worsening symptoms  LEFTWICH-KIRBY, Jabir Dahlem 08/14/2011, 6:37 PM

## 2011-08-20 NOTE — ED Provider Notes (Signed)
Agree with above note.  Shekira Drummer 08/20/2011 8:30 AM   

## 2011-08-21 ENCOUNTER — Ambulatory Visit (INDEPENDENT_AMBULATORY_CARE_PROVIDER_SITE_OTHER): Payer: Medicaid Other | Admitting: Obstetrics & Gynecology

## 2011-08-21 VITALS — Temp 97.0°F | Wt 232.5 lb

## 2011-08-21 DIAGNOSIS — O4420 Partial placenta previa NOS or without hemorrhage, unspecified trimester: Secondary | ICD-10-CM

## 2011-08-21 DIAGNOSIS — O4410 Placenta previa with hemorrhage, unspecified trimester: Secondary | ICD-10-CM

## 2011-08-21 DIAGNOSIS — N926 Irregular menstruation, unspecified: Secondary | ICD-10-CM

## 2011-08-21 LAB — CBC
HCT: 32.3 % — ABNORMAL LOW (ref 36.0–46.0)
MCH: 29.4 pg (ref 26.0–34.0)
MCHC: 32.8 g/dL (ref 30.0–36.0)
MCV: 89.5 fL (ref 78.0–100.0)
Platelets: 247 10*3/uL (ref 150–400)
RDW: 14.2 % (ref 11.5–15.5)

## 2011-08-21 LAB — POCT URINALYSIS DIP (DEVICE)
Hgb urine dipstick: NEGATIVE
Leukocytes, UA: NEGATIVE
Nitrite: NEGATIVE
Urobilinogen, UA: 0.2 mg/dL (ref 0.0–1.0)
pH: 7 (ref 5.0–8.0)

## 2011-08-21 NOTE — Progress Notes (Signed)
Nutrition Note:  (1st consult) Pt seen today for initial Nutr consult. Dx: obesity, hx of ectopic pregnancy 2011. Pt reports good intake of 3 meals and snacks, pt is lactose intolerant and drinks Lactaid milk. Pt has a wt gain of 15# @ [redacted]w[redacted]d gestation which plots about 2-3# > expected.  Pt does plan to breastfeed and has an appt to receive [facility] services.    Follow up if referred. Cy Blamer, RD

## 2011-08-21 NOTE — Progress Notes (Signed)
Addended by: Darrel Hoover on: 08/21/2011 09:13 AM   Modules accepted: Orders

## 2011-08-21 NOTE — Progress Notes (Signed)
U/S scheduled 08/26/11 at 1045 am.

## 2011-08-21 NOTE — Patient Instructions (Signed)
Placenta Previa Placenta previa is a condition in which the placenta has grown low in the womb (uterus). This is a condition in which the organ which connects the fetus to the mother's uterus (placenta) is low in the opening in the uterus (cervix). It can partially or completely cover the cervix. The cause of this is unknown. It is more common with multiple births or twins. SYMPTOMS  The main symptom or sign of placenta previa is vaginal bleeding. The bleeding can be mild to very heavy. This condition can be very serious for the mother and baby. Often there are no symptoms with placenta previa. Sometimes if the location of the placenta is very low it will become partially detached and cause bleeding. This may be simply a marginal sinus separation of the placenta. This is a separation of the vessels from the wall of the uterus. This may cause no further problems other than mild anxiety. There is an increase risk of intrauterine growth restriction (IUGR) with placenta previa because of the abnormal placement of the placenta. DIAGNOSIS  The diagnosis is usually made by ultrasound exam of the uterus. There may be a careful vaginal exam to see the cervix. The patient will be prepared for a Cesarean section immediately if necessary. TREATMENT  Treatment for placenta previa is usually bed rest in the hospital or at home. You may be given medication to stop contractions. Contractions can increase bleeding. Your doctor may take fluid from the baby's sac (amniocentesis) to see if the baby's lungs are mature enough for a C-section. A blood transfusion may be necessary if you have a low blood count. No further treatment may be needed when placenta previa is present in small degrees. Early placenta previa may resolve on it's own. The placenta moves higher in the birth canal as pregnancy progresses. In this case the placenta no longer is an obstruction to birth. The position of the placenta may need to be reconfirmed  during pregnancy. This can be done with an ultrasound exam of the belly(abdomen). Call your caregiver immediately if blood loss is severe. Immediate fluid or blood replacement may be necessary. With complete placenta previa, the only way to safely deliver the baby is by Cesarean section. HOME CARE INSTRUCTIONS   Follow your caregiver's advice about bed rest.   Take any iron pills or other medications your doctor gives to you.   No bending or lifting.   Do not have sexual intercourse.   Do not put anything in your vagina (tampons or vaginal creams). If you are bleeding, use sanitary pads.   Keep your doctors appointments as scheduled. Not keeping the appointment could result in a chronic or permanent injury, pain, disability and injury or death to you or your unborn baby. If there is any problem keeping the appointment, you must call back to this facility for assistance.  SEEK IMMEDIATE MEDICAL CARE IF:   You have increased bleeding.   You have fainting episodes or feel lightheaded.   You develop abdominal pain.   You can no longer feel normal fetal or baby movements.   You develop uterine contractions.  Document Released: 06/30/2005 Document Revised: 03/12/2011 Document Reviewed: 02/11/2008 [employer] Patient Information 2012 [employer].   

## 2011-08-21 NOTE — Progress Notes (Signed)
Pain- sharp pains @ lower abd, "middle of stomach".  Pulse- 95

## 2011-08-21 NOTE — Progress Notes (Signed)
Marginal previa on last [place], will schedule repeat. No problems today, 1 hr GTT ordered

## 2011-08-26 ENCOUNTER — Ambulatory Visit (HOSPITAL_COMMUNITY)
Admission: RE | Admit: 2011-08-26 | Discharge: 2011-08-26 | Disposition: A | Discharge status: Home / Self Care | Payer: BC Managed Care – PPO | Source: Ambulatory Visit | Attending: Obstetrics & Gynecology | Admitting: Obstetrics & Gynecology

## 2011-08-26 ENCOUNTER — Ambulatory Visit (HOSPITAL_COMMUNITY): Payer: Medicaid Other

## 2011-08-26 DIAGNOSIS — O43899 Other placental disorders, unspecified trimester: Secondary | ICD-10-CM | POA: Insufficient documentation

## 2011-08-26 DIAGNOSIS — O4420 Partial placenta previa NOS or without hemorrhage, unspecified trimester: Secondary | ICD-10-CM

## 2011-08-26 DIAGNOSIS — O09299 Supervision of pregnancy with other poor reproductive or obstetric history, unspecified trimester: Secondary | ICD-10-CM | POA: Insufficient documentation

## 2011-09-04 ENCOUNTER — Ambulatory Visit (INDEPENDENT_AMBULATORY_CARE_PROVIDER_SITE_OTHER): Payer: Medicaid Other | Admitting: Physician Assistant

## 2011-09-04 DIAGNOSIS — O4420 Partial placenta previa NOS or without hemorrhage, unspecified trimester: Secondary | ICD-10-CM

## 2011-09-04 DIAGNOSIS — O4410 Placenta previa with hemorrhage, unspecified trimester: Secondary | ICD-10-CM

## 2011-09-04 DIAGNOSIS — O365990 Maternal care for other known or suspected poor fetal growth, unspecified trimester, not applicable or unspecified: Secondary | ICD-10-CM

## 2011-09-04 LAB — POCT URINALYSIS DIP (DEVICE)
Leukocytes, UA: NEGATIVE
Nitrite: NEGATIVE
Protein, ur: NEGATIVE mg/dL
Urobilinogen, UA: 0.2 mg/dL (ref 0.0–1.0)
pH: 7 (ref 5.0–8.0)

## 2011-09-04 NOTE — Patient Instructions (Signed)
Pregnancy - Third Trimester The third trimester of pregnancy (the last 3 months) is a period of the most rapid growth for you and your baby. The baby approaches a length of 20 inches and a weight of 6 to 10 pounds. The baby is adding on fat and getting ready for life outside your body. While inside, babies have periods of sleeping and waking, suck their thumbs, and hiccups. You can often feel small contractions of the uterus. This is false labor. It is also called Braxton-Hicks contractions. This is like a practice for labor. The usual problems in this stage of pregnancy include more difficulty breathing, swelling of the hands and feet from water retention, and having to urinate more often because of the uterus and baby pressing on your bladder.  PRENATAL EXAMS  Blood work may continue to be done during prenatal exams. These tests are done to check on your health and the probable health of your baby. Blood work is used to follow your blood levels (hemoglobin). Anemia (low hemoglobin) is common during pregnancy. Iron and vitamins are given to help prevent this. You may also continue to be checked for diabetes. Some of the past blood tests may be done again.   The size of the uterus is measured during each visit. This makes sure your baby is growing properly according to your pregnancy dates.   Your blood pressure is checked every prenatal visit. This is to make sure you are not getting toxemia.   Your urine is checked every prenatal visit for infection, diabetes and protein.   Your weight is checked at each visit. This is done to make sure gains are happening at the suggested rate and that you and your baby are growing normally.   Sometimes, an ultrasound is performed to confirm the position and the proper growth and development of the baby. This is a test done that bounces harmless sound waves off the baby so your caregiver can more accurately determine due dates.   Discuss the type of pain  medication and anesthesia you will have during your labor and delivery.   Discuss the possibility and anesthesia if a Cesarean Section might be necessary.   Inform your caregiver if there is any mental or physical violence at home.  Sometimes, a specialized non-stress test, contraction stress test and biophysical profile are done to make sure the baby is not having a problem. Checking the amniotic fluid surrounding the baby is called an amniocentesis. The amniotic fluid is removed by sticking a needle into the belly (abdomen). This is sometimes done near the end of pregnancy if an early delivery is required. In this case, it is done to help make sure the baby's lungs are mature enough for the baby to live outside of the womb. If the lungs are not mature and it is unsafe to deliver the baby, an injection of cortisone medication is given to the mother 1 to 2 days before the delivery. This helps the baby's lungs mature and makes it safer to deliver the baby. CHANGES OCCURING IN THE THIRD TRIMESTER OF PREGNANCY Your body goes through many changes during pregnancy. They vary from person to person. Talk to your caregiver about changes you notice and are concerned about.  During the last trimester, you have probably had an increase in your appetite. It is normal to have cravings for certain foods. This varies from person to person and pregnancy to pregnancy.   You may begin to get stretch marks on your hips,   abdomen, and breasts. These are normal changes in the body during pregnancy. There are no exercises or medications to take which prevent this change.   Constipation may be treated with a stool softener or adding bulk to your diet. Drinking lots of fluids, fiber in vegetables, fruits, and whole grains are helpful.   Exercising is also helpful. If you have been very active up until your pregnancy, most of these activities can be continued during your pregnancy. If you have been less active, it is helpful  to start an exercise program such as walking. Consult your caregiver before starting exercise programs.   Avoid all smoking, alcohol, un-prescribed drugs, herbs and "street drugs" during your pregnancy. These chemicals affect the formation and growth of the baby. Avoid chemicals throughout the pregnancy to ensure the delivery of a healthy infant.   Backache, varicose veins and hemorrhoids may develop or get worse.   You will tire more easily in the third trimester, which is normal.   The baby's movements may be stronger and more often.   You may become short of breath easily.   Your belly button may stick out.   A yellow discharge may leak from your breasts called colostrum.   You may have a bloody mucus discharge. This usually occurs a few days to a week before labor begins.  HOME CARE INSTRUCTIONS   Keep your caregiver's appointments. Follow your caregiver's instructions regarding medication use, exercise, and diet.   During pregnancy, you are providing food for you and your baby. Continue to eat regular, well-balanced meals. Choose foods such as meat, fish, milk and other low fat dairy products, vegetables, fruits, and whole-grain breads and cereals. Your caregiver will tell you of the ideal weight gain.   A physical sexual relationship may be continued throughout pregnancy if there are no other problems such as early (premature) leaking of amniotic fluid from the membranes, vaginal bleeding, or belly (abdominal) pain.   Exercise regularly if there are no restrictions. Check with your caregiver if you are unsure of the safety of your exercises. Greater weight gain will occur in the last 2 trimesters of pregnancy. Exercising helps:   Control your weight.   Get you in shape for labor and delivery.   You lose weight after you deliver.   Rest a lot with legs elevated, or as needed for leg cramps or low back pain.   Wear a good support or jogging bra for breast tenderness during  pregnancy. This may help if worn during sleep. Pads or tissues may be used in the bra if you are leaking colostrum.   Do not use hot tubs, steam rooms, or saunas.   Wear your seat belt when driving. This protects you and your baby if you are in an accident.   Avoid raw meat, cat litter boxes and soil used by cats. These carry germs that can cause birth defects in the baby.   It is easier to loose urine during pregnancy. Tightening up and strengthening the pelvic muscles will help with this problem. You can practice stopping your urination while you are going to the bathroom. These are the same muscles you need to strengthen. It is also the muscles you would use if you were trying to stop from passing gas. You can practice tightening these muscles up 10 times a set and repeating this about 3 times per day. Once you know what muscles to tighten up, do not perform these exercises during urination. It is more likely   to cause an infection by backing up the urine.   Ask for help if you have financial, counseling or nutritional needs during pregnancy. Your caregiver will be able to offer counseling for these needs as well as refer you for other special needs.   Make a list of emergency phone numbers and have them available.   Plan on getting help from family or friends when you go home from the hospital.   Make a trial run to the hospital.   Take prenatal classes with the father to understand, practice and ask questions about the labor and delivery.   Prepare the baby's room/nursery.   Do not travel out of the city unless it is absolutely necessary and with the advice of your caregiver.   Wear only low or no heal shoes to have better balance and prevent falling.  MEDICATIONS AND DRUG USE IN PREGNANCY  Take prenatal vitamins as directed. The vitamin should contain 1 milligram of folic acid. Keep all vitamins out of reach of children. Only a couple vitamins or tablets containing iron may be fatal  to a baby or young child when ingested.   Avoid use of all medications, including herbs, over-the-counter medications, not prescribed or suggested by your caregiver. Only take over-the-counter or prescription medicines for pain, discomfort, or fever as directed by your caregiver. Do not use aspirin, ibuprofen (Motrin, Advil, Nuprin) or naproxen (Aleve) unless OK'd by your caregiver.   Let your caregiver also know about herbs you may be using.   Alcohol is related to a number of birth defects. This includes fetal alcohol syndrome. All alcohol, in any form, should be avoided completely. Smoking will cause low birth rate and premature babies.   Street/illegal drugs are very harmful to the baby. They are absolutely forbidden. A baby born to an addicted mother will be addicted at birth. The baby will go through the same withdrawal an adult does.  SEEK MEDICAL CARE IF: You have any concerns or worries during your pregnancy. It is better to call with your questions if you feel they cannot wait, rather than worry about them. DECISIONS ABOUT CIRCUMCISION You may or may not know the sex of your baby. If you know your baby is a boy, it may be time to think about circumcision. Circumcision is the removal of the foreskin of the penis. This is the skin that covers the sensitive end of the penis. There is no proven medical need for this. Often this decision is made on what is popular at the time or based upon religious beliefs and social issues. You can discuss these issues with your caregiver or pediatrician. SEEK IMMEDIATE MEDICAL CARE IF:   An unexplained oral temperature above 102 F (38.9 C) develops, or as your caregiver suggests.   You have leaking of fluid from the vagina (birth canal). If leaking membranes are suspected, take your temperature and tell your caregiver of this when you call.   There is vaginal spotting, bleeding or passing clots. Tell your caregiver of the amount and how many pads are  used.   You develop a bad smelling vaginal discharge with a change in the color from clear to white.   You develop vomiting that lasts more than 24 hours.   You develop chills or fever.   You develop shortness of breath.   You develop burning on urination.   You loose more than 2 pounds of weight or gain more than 2 pounds of weight or as suggested by your   caregiver.   You notice sudden swelling of your face, hands, and feet or legs.   You develop belly (abdominal) pain. Round ligament discomfort is a common non-cancerous (benign) cause of abdominal pain in pregnancy. Your caregiver still must evaluate you.   You develop a severe headache that does not go away.   You develop visual problems, blurred or double vision.   If you have not felt your baby move for more than 1 hour. If you think the baby is not moving as much as usual, eat something with sugar in it and lie down on your left side for an hour. The baby should move at least 4 to 5 times per hour. Call right away if your baby moves less than that.   You fall, are in a car accident or any kind of trauma.   There is mental or physical violence at home.  Document Released: 06/24/2001 Document Revised: 03/12/2011 Document Reviewed: 12/27/2008 [employer] Patient Information 2012 [employer]. 

## 2011-09-04 NOTE — Progress Notes (Signed)
+   FM. Marginal previa resolved. TDap given at [facility] before starting clomid, will obtain records. Reassess growth in 4 weeks to r/o asymmetric IUGR

## 2011-09-04 NOTE — Progress Notes (Signed)
F/U U/S September 18, 2011 at 945 am.

## 2011-09-04 NOTE — Progress Notes (Signed)
P=103, C/o pelvic pressure and cramps x 1 week, discussed TDaP, pt.

## 2011-09-05 ENCOUNTER — Encounter (HOSPITAL_COMMUNITY): Payer: Self-pay

## 2011-09-05 ENCOUNTER — Inpatient Hospital Stay (HOSPITAL_COMMUNITY)
Admission: AD | Admit: 2011-09-05 | Discharge: 2011-09-05 | Disposition: A | Discharge status: Home / Self Care | Payer: Medicaid Other | Source: Ambulatory Visit | Attending: Obstetrics & Gynecology | Admitting: Obstetrics & Gynecology

## 2011-09-05 DIAGNOSIS — O008 Other ectopic pregnancy without intrauterine pregnancy: Secondary | ICD-10-CM

## 2011-09-05 DIAGNOSIS — O368190 Decreased fetal movements, unspecified trimester, not applicable or unspecified: Secondary | ICD-10-CM | POA: Insufficient documentation

## 2011-09-05 DIAGNOSIS — E282 Polycystic ovarian syndrome: Secondary | ICD-10-CM

## 2011-09-05 NOTE — Progress Notes (Signed)
Patient reports no fetal movement today from 10am-3pm, started moving after that. Denies contractions, vaginal bleeding or leaking of fluid.

## 2011-09-05 NOTE — Discharge Instructions (Signed)
Fetal Movement Counts °Patient Name: __________________________________________________ Patient Due Date: ____________________ °Kick counts is highly recommended in high risk pregnancies, but it is a good idea for every pregnant woman to do. Start counting fetal movements at 28 weeks of the pregnancy. Fetal movements increase after eating a full meal or eating or drinking something sweet (the blood sugar is higher). It is also important to drink plenty of fluids (well hydrated) before doing the count. Lie on your left side because it helps with the circulation or you can sit in a comfortable chair with your arms over your belly (abdomen) with no distractions around you. °DOING THE COUNT °· Try to do the count the same time of day each time you do it.  °· Mark the day and time, then see how long it takes for you to feel 10 movements (kicks, flutters, swishes, rolls). You should have at least 10 movements within 2 hours. You will most likely feel 10 movements in much less than 2 hours. If you do not, wait an hour and count again. After a couple of days you will see a pattern.  °· What you are looking for is a change in the pattern or not enough counts in 2 hours. Is it taking longer in time to reach 10 movements?  °SEEK MEDICAL CARE IF: °· You feel less than 10 counts in 2 hours. Tried twice.  °· No movement in one hour.  °· The pattern is changing or taking longer each day to reach 10 counts in 2 hours.  °· You feel the baby is not moving as it usually does.  °Date: ____________ Movements: ____________ Start time: ____________ Finish time: ____________  °Date: ____________ Movements: ____________ Start time: ____________ Finish time: ____________ °Date: ____________ Movements: ____________ Start time: ____________ Finish time: ____________ °Date: ____________ Movements: ____________ Start time: ____________ Finish time: ____________ °Date: ____________ Movements: ____________ Start time: ____________ Finish time:  ____________ °Date: ____________ Movements: ____________ Start time: ____________ Finish time: ____________ °Date: ____________ Movements: ____________ Start time: ____________ Finish time: ____________ °Date: ____________ Movements: ____________ Start time: ____________ Finish time: ____________  °Date: ____________ Movements: ____________ Start time: ____________ Finish time: ____________ °Date: ____________ Movements: ____________ Start time: ____________ Finish time: ____________ °Date: ____________ Movements: ____________ Start time: ____________ Finish time: ____________ °Date: ____________ Movements: ____________ Start time: ____________ Finish time: ____________ °Date: ____________ Movements: ____________ Start time: ____________ Finish time: ____________ °Date: ____________ Movements: ____________ Start time: ____________ Finish time: ____________ °Date: ____________ Movements: ____________ Start time: ____________ Finish time: ____________  °Date: ____________ Movements: ____________ Start time: ____________ Finish time: ____________ °Date: ____________ Movements: ____________ Start time: ____________ Finish time: ____________ °Date: ____________ Movements: ____________ Start time: ____________ Finish time: ____________ °Date: ____________ Movements: ____________ Start time: ____________ Finish time: ____________ °Date: ____________ Movements: ____________ Start time: ____________ Finish time: ____________ °Date: ____________ Movements: ____________ Start time: ____________ Finish time: ____________ °Date: ____________ Movements: ____________ Start time: ____________ Finish time: ____________  °Date: ____________ Movements: ____________ Start time: ____________ Finish time: ____________ °Date: ____________ Movements: ____________ Start time: ____________ Finish time: ____________ °Date: ____________ Movements: ____________ Start time: ____________ Finish time: ____________ °Date: ____________ Movements:  ____________ Start time: ____________ Finish time: ____________ °Date: ____________ Movements: ____________ Start time: ____________ Finish time: ____________ °Date: ____________ Movements: ____________ Start time: ____________ Finish time: ____________ °Date: ____________ Movements: ____________ Start time: ____________ Finish time: ____________  °Date: ____________ Movements: ____________ Start time: ____________ Finish time: ____________ °Date: ____________ Movements: ____________ Start time: ____________ Finish time: ____________ °Date: ____________ Movements: ____________ Start time:   ____________ Finish time: ____________ °Date: ____________ Movements: ____________ Start time: ____________ Finish time: ____________ °Date: ____________ Movements: ____________ Start time: ____________ Finish time: ____________ °Date: ____________ Movements: ____________ Start time: ____________ Finish time: ____________ °Date: ____________ Movements: ____________ Start time: ____________ Finish time: ____________  °Date: ____________ Movements: ____________ Start time: ____________ Finish time: ____________ °Date: ____________ Movements: ____________ Start time: ____________ Finish time: ____________ °Date: ____________ Movements: ____________ Start time: ____________ Finish time: ____________ °Date: ____________ Movements: ____________ Start time: ____________ Finish time: ____________ °Date: ____________ Movements: ____________ Start time: ____________ Finish time: ____________ °Date: ____________ Movements: ____________ Start time: ____________ Finish time: ____________ °Date: ____________ Movements: ____________ Start time: ____________ Finish time: ____________  °Date: ____________ Movements: ____________ Start time: ____________ Finish time: ____________ °Date: ____________ Movements: ____________ Start time: ____________ Finish time: ____________ °Date: ____________ Movements: ____________ Start time: ____________ Finish  time: ____________ °Date: ____________ Movements: ____________ Start time: ____________ Finish time: ____________ °Date: ____________ Movements: ____________ Start time: ____________ Finish time: ____________ °Date: ____________ Movements: ____________ Start time: ____________ Finish time: ____________ °Date: ____________ Movements: ____________ Start time: ____________ Finish time: ____________  °Date: ____________ Movements: ____________ Start time: ____________ Finish time: ____________ °Date: ____________ Movements: ____________ Start time: ____________ Finish time: ____________ °Date: ____________ Movements: ____________ Start time: ____________ Finish time: ____________ °Date: ____________ Movements: ____________ Start time: ____________ Finish time: ____________ °Date: ____________ Movements: ____________ Start time: ____________ Finish time: ____________ °Date: ____________ Movements: ____________ Start time: ____________ Finish time: ____________ °Document Released: 07/30/2006 Document Revised: 03/12/2011 Document Reviewed: 01/30/2009 °[employer]® Patient Information ©2012 [employer]. °

## 2011-09-05 NOTE — ED Provider Notes (Signed)
History    Decreased Fetal movment  HPI This is a 31 y.o. G2 P0010 at [redacted]w[redacted]d who presents for fetal evaluation due to feeling no fetal movement from 10am to 3pm.  The baby then started moving, but RN told her to come in anyway. No leaking, bleeding or contractions.   OB History    Grav Para Term Preterm Abortions TAB SAB Ect Mult Living   2 0 0 0 1 0 0 1 0 0       Past Medical History  Diagnosis Date  . Herpes     never had outbreak. pos per blood, doesn't know which type  . Polycystic ovarian syndrome   . Abnormal Pap smear 2006    leep    Past Surgical History  Procedure Date  . Knee surgery 1998    right knee  . Leep 2006  . Hysteroscopy 2009    uterine polyp  . Sphincterotomy 2010  . Ivf retrival 2010, 2011    Family History  Problem Relation Age of Onset  . Diabetes Maternal Grandmother   . Heart disease Maternal Grandmother   . Hypertension Maternal Grandfather   . Cancer Maternal Grandfather     bone, colon   . Diabetes Mother   . Cancer Mother     breast cancer  . Anesthesia problems Neg Hx     History  Substance Use Topics  . Smoking status: Never Smoker   . Smokeless tobacco: Never Used  . Alcohol Use: No    Allergies: No Known Allergies  Prescriptions prior to admission  Medication Sig Dispense Refill  . Prenatal Vit-Fe Fumarate-FA (PRENATAL MULTIVITAMIN) TABS Take 1 tablet by mouth daily.        ROS As above  Physical Exam   Temperature 97.6 F (36.4 C), temperature source Oral, resp. rate 18, last menstrual period 02/14/2011, unknown if currently breastfeeding.  Physical Exam  Constitutional: She is oriented to person, place, and time. She appears well-developed and well-nourished.  HENT:  Head: Normocephalic.  Cardiovascular: Normal rate.   Respiratory: Effort normal.  GI: Soft.  Genitourinary:       FHR reactive with good accelerations 15-20 beats above baseline. No contractions.  Musculoskeletal: Normal range of motion.    Neurological: She is alert and oriented to person, place, and time.  Skin: Skin is warm and dry.  Psychiatric: She has a normal mood and affect.    MAU Course  Procedures  Assessment and Plan  A:  SIUP at [redacted]w[redacted]d      Decreased fetal movement earlier today.      Reactive FHR pattern P:  D/C home.      Reviewed fetal movement patterns.      Followup in clinic as scheduled  [facility] 09/05/2011, 5:11 PM

## 2011-09-18 ENCOUNTER — Telehealth: Payer: Self-pay | Admitting: *Deleted

## 2011-09-18 ENCOUNTER — Ambulatory Visit (HOSPITAL_COMMUNITY)
Admission: RE | Admit: 2011-09-18 | Discharge: 2011-09-18 | Disposition: A | Discharge status: Home / Self Care | Payer: Medicaid Other | Source: Ambulatory Visit | Attending: Physician Assistant | Admitting: Physician Assistant

## 2011-09-18 DIAGNOSIS — O365990 Maternal care for other known or suspected poor fetal growth, unspecified trimester, not applicable or unspecified: Secondary | ICD-10-CM

## 2011-09-18 DIAGNOSIS — O09299 Supervision of pregnancy with other poor reproductive or obstetric history, unspecified trimester: Secondary | ICD-10-CM | POA: Insufficient documentation

## 2011-09-18 DIAGNOSIS — O43899 Other placental disorders, unspecified trimester: Secondary | ICD-10-CM | POA: Insufficient documentation

## 2011-09-18 NOTE — Telephone Encounter (Signed)
Pt called stating has a question about her ultra sound . There was problem with babie's stomach.

## 2011-09-18 NOTE — Telephone Encounter (Signed)
Telephoned pt at home # left message to return call. Reviewed Ultra sound and no noted abnormalities.

## 2011-09-22 NOTE — Telephone Encounter (Signed)
Called patient , reviewed chart , informed her last Ultrasound reports abdominal circumference wnl, at 9%, was 8 %. Instructed patient to discuss any concerns  with provider at her next appointment 09/24/11.  Patient voices understanding.

## 2011-09-24 ENCOUNTER — Encounter: Payer: Medicaid Other | Admitting: Family Medicine

## 2011-10-27 ENCOUNTER — Telehealth: Payer: Self-pay | Admitting: *Deleted

## 2011-10-27 NOTE — Telephone Encounter (Signed)
Returned patients call to get more information, no answer left a voice mail to call us back.

## 2011-10-27 NOTE — Telephone Encounter (Signed)
Pt called stating has transferred cared to another facility and would like to know if he needs to transfer care back to this facility.

## 2011-10-30 NOTE — Telephone Encounter (Signed)
Telephoned patient at home #. Patient has transferred care to another facility and was wanting advise about what the doctor's there had stated about her baby's stomach. Advised patient she would need to discuss that with the doctor's at that facility since she was under their care. Patient voiced understanding.

## 2011-11-24 ENCOUNTER — Encounter (HOSPITAL_COMMUNITY): Payer: Self-pay | Admitting: *Deleted

## 2011-11-24 ENCOUNTER — Telehealth (HOSPITAL_COMMUNITY): Payer: Self-pay | Admitting: *Deleted

## 2011-11-24 NOTE — Telephone Encounter (Signed)
Preadmission screen  

## 2011-11-25 ENCOUNTER — Encounter (HOSPITAL_COMMUNITY): Payer: Self-pay

## 2011-11-25 ENCOUNTER — Inpatient Hospital Stay (HOSPITAL_COMMUNITY)
Admission: RE | Admit: 2011-11-25 | Discharge: 2011-11-28 | DRG: 372 | Disposition: A | Discharge status: Home / Self Care | Payer: BC Managed Care – PPO | Source: Ambulatory Visit | Attending: Obstetrics and Gynecology | Admitting: Obstetrics and Gynecology

## 2011-11-25 VITALS — BP 105/70 | HR 79 | Temp 97.9°F | Resp 18 | Ht 67.0 in | Wt 240.0 lb

## 2011-11-25 DIAGNOSIS — O008 Other ectopic pregnancy without intrauterine pregnancy: Secondary | ICD-10-CM

## 2011-11-25 DIAGNOSIS — O99893 Other specified diseases and conditions complicating puerperium: Secondary | ICD-10-CM | POA: Diagnosis not present

## 2011-11-25 DIAGNOSIS — E282 Polycystic ovarian syndrome: Secondary | ICD-10-CM

## 2011-11-25 DIAGNOSIS — R000 Tachycardia, unspecified: Secondary | ICD-10-CM | POA: Diagnosis not present

## 2011-11-25 DIAGNOSIS — O480 Post-term pregnancy: Principal | ICD-10-CM | POA: Diagnosis present

## 2011-11-25 DIAGNOSIS — I959 Hypotension, unspecified: Secondary | ICD-10-CM | POA: Diagnosis not present

## 2011-11-25 LAB — CBC
MCH: 29.7 pg (ref 26.0–34.0)
MCHC: 32.7 g/dL (ref 30.0–36.0)
Platelets: 204 10*3/uL (ref 150–400)
RBC: 3.74 MIL/uL — ABNORMAL LOW (ref 3.87–5.11)

## 2011-11-25 MED ORDER — LACTATED RINGERS IV SOLN
500.0000 mL | INTRAVENOUS | Status: DC | PRN
  Administered 2011-11-25 – 2011-11-26 (×2): 500 mL via INTRAVENOUS

## 2011-11-25 MED ORDER — OXYTOCIN 20 UNITS IN LACTATED RINGERS INFUSION - SIMPLE
125.0000 mL/h | Freq: Once | INTRAVENOUS | Status: AC
  Administered 2011-11-26: 125 mL/h via INTRAVENOUS

## 2011-11-25 MED ORDER — OXYCODONE-ACETAMINOPHEN 5-325 MG PO TABS: 1.0000 | ORAL_TABLET | ORAL | Status: DC | PRN

## 2011-11-25 MED ORDER — OXYTOCIN BOLUS FROM INFUSION
500.0000 mL | Freq: Once | INTRAVENOUS | Status: DC
  Filled 2011-11-25: qty 500
  Filled 2011-11-25: qty 1000

## 2011-11-25 MED ORDER — LIDOCAINE HCL (PF) 1 % IJ SOLN
30.0000 mL | INTRAMUSCULAR | Status: DC | PRN
  Filled 2011-11-25: qty 30

## 2011-11-25 MED ORDER — ACETAMINOPHEN 325 MG PO TABS: 650.0000 mg | ORAL_TABLET | ORAL | Status: DC | PRN

## 2011-11-25 MED ORDER — ONDANSETRON HCL 4 MG/2ML IJ SOLN: 4.0000 mg | Freq: Four times a day (QID) | INTRAMUSCULAR | Status: DC | PRN

## 2011-11-25 MED ORDER — ZOLPIDEM TARTRATE 10 MG PO TABS
10.0000 mg | ORAL_TABLET | Freq: Every evening | ORAL | Status: DC | PRN
  Administered 2011-11-25: 10 mg via ORAL
  Filled 2011-11-25: qty 1

## 2011-11-25 MED ORDER — MISOPROSTOL 25 MCG QUARTER TABLET
25.0000 ug | ORAL_TABLET | ORAL | Status: DC | PRN
  Administered 2011-11-25 – 2011-11-26 (×2): 25 ug via VAGINAL
  Filled 2011-11-25 (×2): qty 0.25

## 2011-11-25 MED ORDER — FLEET ENEMA 7-19 GM/118ML RE ENEM: 1.0000 | ENEMA | RECTAL | Status: DC | PRN

## 2011-11-25 MED ORDER — TERBUTALINE SULFATE 1 MG/ML IJ SOLN: 0.2500 mg | Freq: Once | INTRAMUSCULAR | Status: AC | PRN

## 2011-11-25 MED ORDER — CITRIC ACID-SODIUM CITRATE 334-500 MG/5ML PO SOLN
30.0000 mL | ORAL | Status: DC | PRN
  Filled 2011-11-25: qty 15

## 2011-11-25 MED ORDER — IBUPROFEN 600 MG PO TABS
600.0000 mg | ORAL_TABLET | Freq: Four times a day (QID) | ORAL | Status: DC | PRN
  Administered 2011-11-26: 600 mg via ORAL
  Filled 2011-11-25: qty 1

## 2011-11-25 MED ORDER — LACTATED RINGERS IV SOLN
INTRAVENOUS | Status: DC
  Administered 2011-11-25: 125 mL/h via INTRAVENOUS
  Administered 2011-11-26: 17:00:00 via INTRAVENOUS
  Administered 2011-11-26 (×2): 125 mL/h via INTRAVENOUS
  Administered 2011-11-26: 09:00:00 via INTRAVENOUS

## 2011-11-25 NOTE — H&P (Signed)
31 y.o. G2P0010  Estimated Date of Delivery: 11/21/11 admitted at 40/[redacted] weeks gestation for induction. Prenatal course was complicated by concerns over persistent AC measurements <3rd%ile.  Despite this, her EFW was consistent at 25th%ile throughout the 3rd trimester.  She was followed with semiweekly NST's. Prenatal labs: Blood Type:A+.  Screening tests for HIV, Syphilis, Hepatitis B, Rubella sensitivity, fetal anomalies, gestational diabetes, and perineal group B strep colonization were negative.    Afebrile, VSS Heart and Lungs: No active disease Abdomen: soft, gravid, EFW 6 - 7 lbs. Cervical exam:  Closed, 80% effaced.  Impression: Post dates pregnancy, unexplained small AC.  Plan:  IOL

## 2011-11-26 ENCOUNTER — Encounter (HOSPITAL_COMMUNITY): Payer: Self-pay

## 2011-11-26 ENCOUNTER — Inpatient Hospital Stay (HOSPITAL_COMMUNITY): Payer: BC Managed Care – PPO | Admitting: Anesthesiology

## 2011-11-26 ENCOUNTER — Encounter (HOSPITAL_COMMUNITY): Payer: Self-pay | Admitting: Anesthesiology

## 2011-11-26 LAB — PREPARE RBC (CROSSMATCH)

## 2011-11-26 LAB — CBC
HCT: 32.5 % — ABNORMAL LOW (ref 36.0–46.0)
Hemoglobin: 10.5 g/dL — ABNORMAL LOW (ref 12.0–15.0)
MCH: 29.2 pg (ref 26.0–34.0)
MCHC: 32.3 g/dL (ref 30.0–36.0)
RDW: 14.3 % (ref 11.5–15.5)

## 2011-11-26 LAB — MRSA PCR SCREENING: MRSA by PCR: NEGATIVE

## 2011-11-26 LAB — RPR: RPR Ser Ql: NONREACTIVE

## 2011-11-26 MED ORDER — DIPHENHYDRAMINE HCL 50 MG/ML IJ SOLN: 12.5000 mg | INTRAMUSCULAR | Status: DC | PRN

## 2011-11-26 MED ORDER — BUPIVACAINE HCL (PF) 0.25 % IJ SOLN
INTRAMUSCULAR | Status: DC | PRN
  Administered 2011-11-26 (×2): 5 mL via EPIDURAL

## 2011-11-26 MED ORDER — PHENYLEPHRINE 40 MCG/ML (10ML) SYRINGE FOR IV PUSH (FOR BLOOD PRESSURE SUPPORT)
80.0000 ug | PREFILLED_SYRINGE | INTRAVENOUS | Status: DC | PRN
  Filled 2011-11-26 (×2): qty 5

## 2011-11-26 MED ORDER — EPHEDRINE 5 MG/ML INJ
10.0000 mg | INTRAVENOUS | Status: DC | PRN
  Filled 2011-11-26 (×2): qty 4

## 2011-11-26 MED ORDER — FENTANYL CITRATE 0.05 MG/ML IJ SOLN: 100.0000 ug | Freq: Once | INTRAMUSCULAR | Status: DC

## 2011-11-26 MED ORDER — IBUPROFEN 600 MG PO TABS
600.0000 mg | ORAL_TABLET | Freq: Four times a day (QID) | ORAL | Status: DC
  Administered 2011-11-27 – 2011-11-28 (×6): 600 mg via ORAL
  Filled 2011-11-26 (×7): qty 1

## 2011-11-26 MED ORDER — SODIUM BICARBONATE 8.4 % IV SOLN
INTRAVENOUS | Status: DC | PRN
  Administered 2011-11-26: 5 mL via EPIDURAL

## 2011-11-26 MED ORDER — FENTANYL 2.5 MCG/ML BUPIVACAINE 1/10 % EPIDURAL INFUSION (WH - ANES)
14.0000 mL/h | INTRAMUSCULAR | Status: DC
  Administered 2011-11-26 (×4): 14 mL/h via EPIDURAL
  Filled 2011-11-26 (×4): qty 60

## 2011-11-26 MED ORDER — MISOPROSTOL 200 MCG PO TABS
ORAL_TABLET | ORAL | Status: AC
  Administered 2011-11-26: 800 ug via RECTAL
  Filled 2011-11-26: qty 4

## 2011-11-26 MED ORDER — BUTORPHANOL TARTRATE 2 MG/ML IJ SOLN
1.0000 mg | INTRAMUSCULAR | Status: DC | PRN
  Administered 2011-11-26 (×2): 1 mg via INTRAVENOUS
  Filled 2011-11-26 (×2): qty 1

## 2011-11-26 MED ORDER — LIDOCAINE HCL (PF) 1 % IJ SOLN
INTRAMUSCULAR | Status: DC | PRN
  Administered 2011-11-26 (×3): 4 mL

## 2011-11-26 MED ORDER — METHYLERGONOVINE MALEATE 0.2 MG/ML IJ SOLN
INTRAMUSCULAR | Status: AC
  Administered 2011-11-26: 0.2 mg via INTRAMUSCULAR
  Filled 2011-11-26: qty 1

## 2011-11-26 MED ORDER — OXYCODONE-ACETAMINOPHEN 5-325 MG PO TABS
1.0000 | ORAL_TABLET | ORAL | Status: DC | PRN
  Administered 2011-11-27 – 2011-11-28 (×5): 1 via ORAL
  Filled 2011-11-26 (×5): qty 1

## 2011-11-26 MED ORDER — PHENYLEPHRINE 40 MCG/ML (10ML) SYRINGE FOR IV PUSH (FOR BLOOD PRESSURE SUPPORT): 80.0000 ug | PREFILLED_SYRINGE | INTRAVENOUS | Status: DC | PRN

## 2011-11-26 MED ORDER — EPHEDRINE 5 MG/ML INJ: 10.0000 mg | INTRAVENOUS | Status: DC | PRN

## 2011-11-26 MED ORDER — NALOXONE HCL 0.4 MG/ML IJ SOLN
INTRAMUSCULAR | Status: AC
  Filled 2011-11-26: qty 1

## 2011-11-26 MED ORDER — LACTATED RINGERS IV SOLN: 500.0000 mL | Freq: Once | INTRAVENOUS | Status: DC

## 2011-11-26 MED ORDER — OXYTOCIN 20 UNITS IN LACTATED RINGERS INFUSION - SIMPLE
1.0000 m[IU]/min | INTRAVENOUS | Status: DC
  Administered 2011-11-26: 333 m[IU]/min via INTRAVENOUS
  Administered 2011-11-26: 2 m[IU]/min via INTRAVENOUS
  Filled 2011-11-26: qty 1000

## 2011-11-26 MED ORDER — FENTANYL CITRATE 0.05 MG/ML IJ SOLN
INTRAMUSCULAR | Status: AC
  Filled 2011-11-26: qty 2

## 2011-11-26 MED ORDER — FENTANYL CITRATE 0.05 MG/ML IJ SOLN
INTRAMUSCULAR | Status: DC | PRN
  Administered 2011-11-26: 100 ug via EPIDURAL

## 2011-11-26 NOTE — Progress Notes (Signed)
Patient progressed to rim dilation.  Patient is comfortable with contractions but can not relax enough for me to fully assess whether fetus is OA or OP.  I believe she is OA.  I placed an IUPC to confirm adequate contractions and allow patient to labor down.  FHT's are cat. 1.

## 2011-11-26 NOTE — Anesthesia Procedure Notes (Signed)
Epidural Patient location during procedure: OB Start time: 11/26/2011 8:48 AM Reason for block: procedure for pain  Staffing Performed by: anesthesiologist   Preanesthetic Checklist Completed: patient identified, site marked, surgical consent, pre-op evaluation, timeout performed, IV checked, risks and benefits discussed and monitors and equipment checked  Epidural Patient position: sitting Prep: site prepped and draped and DuraPrep Patient monitoring: continuous pulse ox and blood pressure Approach: midline Injection technique: LOR air  Needle:  Needle type: Tuohy  Needle gauge: 17 G Needle length: 9 cm Needle insertion depth: 7 cm Catheter type: closed end flexible Catheter size: 19 Gauge Catheter at skin depth: 12 cm Test dose: negative  Assessment Events: blood not aspirated, injection not painful, no injection resistance, negative IV test and no paresthesia  Additional Notes Discussed risk of headache, infection, bleeding, nerve injury and failed or incomplete block.  Patient voices understanding and wishes to proceed.

## 2011-11-26 NOTE — Progress Notes (Addendum)
Dr. Henderson Cloud on the phone and notified of pt status, SVE, FHR, UC pattern/tachysystolic, and pitocin not started. Orders received to let the next doctor reevaluate plan of care and pitocin.

## 2011-11-26 NOTE — Anesthesia Preprocedure Evaluation (Signed)
Anesthesia Evaluation  Patient identified by MRN, date of birth, ID band Patient awake    Reviewed: Allergy & Precautions, H&P , NPO status , Patient's Chart, lab work & pertinent test results, reviewed documented beta blocker date and time   History of Anesthesia Complications Negative for: history of anesthetic complications  Airway Mallampati: III TM Distance: >3 FB Neck ROM: full    Dental  (+) Teeth Intact   Pulmonary neg pulmonary ROS,  breath sounds clear to auscultation        Cardiovascular negative cardio ROS  Rhythm:regular Rate:Normal     Neuro/Psych negative neurological ROS  negative psych ROS   GI/Hepatic negative GI ROS, Neg liver ROS,   Endo/Other  Morbid obesity  Renal/GU negative Renal ROS     Musculoskeletal   Abdominal   Peds  Hematology negative hematology ROS (+)   Anesthesia Other Findings   Reproductive/Obstetrics (+) Pregnancy                           Anesthesia Physical Anesthesia Plan  ASA: II  Anesthesia Plan: Epidural   Post-op Pain Management:    Induction:   Airway Management Planned:   Additional Equipment:   Intra-op Plan:   Post-operative Plan:   Informed Consent: I have reviewed the patients History and Physical, chart, labs and discussed the procedure including the risks, benefits and alternatives for the proposed anesthesia with the patient or authorized representative who has indicated his/her understanding and acceptance.     Plan Discussed with:   Anesthesia Plan Comments:         Anesthesia Quick Evaluation  

## 2011-11-26 NOTE — Progress Notes (Signed)
Dr. Henderson Cloud notified that pt was contacting too much for misoprostal placement.

## 2011-11-26 NOTE — Progress Notes (Signed)
Complete at 1900 and has been pushing for 2 hours.  Vtx +1 and feels OP.  I will ask anesthesia to redose epidural and see if patient can allow me to attempt a manual rotation.  If this is not successful, I will proceed with C/S for CPD.

## 2011-11-27 ENCOUNTER — Encounter (HOSPITAL_COMMUNITY): Payer: Self-pay

## 2011-11-27 LAB — CBC
HCT: 28.8 % — ABNORMAL LOW (ref 36.0–46.0)
Hemoglobin: 9.6 g/dL — ABNORMAL LOW (ref 12.0–15.0)
MCV: 89.2 fL (ref 78.0–100.0)
RBC: 3.23 MIL/uL — ABNORMAL LOW (ref 3.87–5.11)
WBC: 12.7 10*3/uL — ABNORMAL HIGH (ref 4.0–10.5)

## 2011-11-27 MED ORDER — DIBUCAINE 1 % RE OINT: 1.0000 "application " | TOPICAL_OINTMENT | RECTAL | Status: DC | PRN

## 2011-11-27 MED ORDER — BENZOCAINE-MENTHOL 20-0.5 % EX AERO
1.0000 "application " | INHALATION_SPRAY | CUTANEOUS | Status: DC | PRN
  Administered 2011-11-27: 1 via TOPICAL
  Filled 2011-11-27: qty 56

## 2011-11-27 MED ORDER — TETANUS-DIPHTH-ACELL PERTUSSIS 5-2.5-18.5 LF-MCG/0.5 IM SUSP: 0.5000 mL | Freq: Once | INTRAMUSCULAR | Status: DC

## 2011-11-27 MED ORDER — SENNOSIDES-DOCUSATE SODIUM 8.6-50 MG PO TABS
2.0000 | ORAL_TABLET | Freq: Every day | ORAL | Status: DC
  Administered 2011-11-27: 2 via ORAL

## 2011-11-27 MED ORDER — SIMETHICONE 80 MG PO CHEW: 80.0000 mg | CHEWABLE_TABLET | ORAL | Status: DC | PRN

## 2011-11-27 MED ORDER — ZOLPIDEM TARTRATE 5 MG PO TABS: 5.0000 mg | ORAL_TABLET | Freq: Every evening | ORAL | Status: DC | PRN

## 2011-11-27 MED ORDER — ONDANSETRON HCL 4 MG/2ML IJ SOLN: 4.0000 mg | INTRAMUSCULAR | Status: DC | PRN

## 2011-11-27 MED ORDER — WITCH HAZEL-GLYCERIN EX PADS: 1.0000 "application " | MEDICATED_PAD | CUTANEOUS | Status: DC | PRN

## 2011-11-27 MED ORDER — DIPHENHYDRAMINE HCL 25 MG PO CAPS: 25.0000 mg | ORAL_CAPSULE | Freq: Four times a day (QID) | ORAL | Status: DC | PRN

## 2011-11-27 MED ORDER — LANOLIN HYDROUS EX OINT: TOPICAL_OINTMENT | CUTANEOUS | Status: DC | PRN

## 2011-11-27 MED ORDER — PRENATAL MULTIVITAMIN CH
1.0000 | ORAL_TABLET | Freq: Every day | ORAL | Status: DC
  Administered 2011-11-27 – 2011-11-28 (×2): 1 via ORAL
  Filled 2011-11-27 (×2): qty 1

## 2011-11-27 MED ORDER — ONDANSETRON HCL 4 MG PO TABS: 4.0000 mg | ORAL_TABLET | ORAL | Status: DC | PRN

## 2011-11-27 NOTE — Progress Notes (Addendum)
Patient has foley in and NPO secondary PPH.  Pain control is good.  No c/o anemia.  Filed Vitals:   11/27/11 0043 11/27/11 0159 11/27/11 0443 11/27/11 0626  BP: 129/83 130/86  122/82  Pulse: 91 108  81  Temp: 100.2 F (37.9 C) 100 F (37.8 C) 98.4 F (36.9 C) 98.6 F (37 C)  TempSrc: Oral Oral Oral Oral  Resp: 18 18  18   Height:      Weight:      SpO2: 95% 97%  98%   Cor RRR Fundus firm Perineum without swelling or active bleeding.  Lab Results  Component Value Date   WBC 12.7* 11/27/2011   HGB 9.6* 11/27/2011   HCT 28.8* 11/27/2011   MCV 89.2 11/27/2011   PLT 179 11/27/2011    --/--/A POS (05/15 2240)/RI  A/P Post partum day 1. Ok to D/C epidural and foley.  Routine care.  Expect d/c tomorrow.    Marcellous Snarski A

## 2011-11-27 NOTE — Anesthesia Postprocedure Evaluation (Signed)
  Anesthesia Post-op Note  Patient: Brenda Williams  Procedure(s) Performed: * No procedures listed *  Patient Location: PACU and Mother/Baby  Anesthesia Type: Epidural  Level of Consciousness: awake, alert  and oriented  Airway and Oxygen Therapy: Patient Spontanous Breathing  Post-op Pain: mild  Post-op Assessment: Post-op Vital signs reviewed, Patient's Cardiovascular Status Stable, Respiratory Function Stable and Pain level controlled  Post-op Vital Signs: stable  Complications: No apparent anesthesia complications

## 2011-11-28 MED ORDER — IBUPROFEN 600 MG PO TABS: 600.0000 mg | ORAL_TABLET | Freq: Four times a day (QID) | ORAL | Status: DC | PRN

## 2011-11-28 MED ORDER — DOCUSATE SODIUM 100 MG PO CAPS: 100.0000 mg | ORAL_CAPSULE | Freq: Two times a day (BID) | ORAL | Status: AC

## 2011-11-28 MED ORDER — IBUPROFEN 600 MG PO TABS: 600.0000 mg | ORAL_TABLET | Freq: Four times a day (QID) | ORAL | Status: AC | PRN

## 2011-11-28 MED ORDER — DOCUSATE SODIUM 100 MG PO CAPS: 100.0000 mg | ORAL_CAPSULE | Freq: Two times a day (BID) | ORAL | Status: DC

## 2011-11-28 MED ORDER — HYDROCODONE-ACETAMINOPHEN 5-500 MG PO TABS: 1.0000 | ORAL_TABLET | ORAL | Status: DC | PRN

## 2011-11-28 MED ORDER — HYDROCODONE-ACETAMINOPHEN 5-500 MG PO TABS: 1.0000 | ORAL_TABLET | ORAL | Status: AC | PRN

## 2011-11-28 NOTE — Progress Notes (Signed)
Patient ID: Brenda Williams, female   DOB: 1981-01-17, 31 y.o.   MRN: [id number]  PPD#2 S/P SVD  S: Doing well. Peds looking to start phototherapy on baby. Uncertain if to start in hospital or outpt. O:  Filed Vitals:   11/27/11 1125 11/27/11 1450 11/27/11 2140 11/28/11 0530  BP: 124/71 121/72 107/70 105/70  Pulse: 104 102 86 79  Temp:  98.7 F (37.1 C) 98.3 F (36.8 C) 97.9 F (36.6 C)  TempSrc:  Oral Oral Oral  Resp:  20 18 18   Height:      Weight:      SpO2:       AOX3, NAD Abd soft NT Hemoglobin & Hematocrit     Component Value Date/Time   HGB 9.6* 11/27/2011 0205   HCT 28.8* 11/27/2011 0205   A/P 1) Routine PP care  2) Pt scheduled for d/c today.  Will D/C home pending peds release of baby

## 2011-11-28 NOTE — Discharge Summary (Signed)
Obstetric Discharge Summary Reason for Admission: onset of labor Prenatal Procedures: NST and ultrasound Intrapartum Procedures: spontaneous vaginal delivery with vacuum extractor. PP hemorrhage that resolved with misoprostal, bimanual massage, and blunt curettage Postpartum Procedures: None Complications-Operative and Postpartum: 2nd degree perineal laceration Hemoglobin  Date Value Range Status  11/27/2011 9.6* 12.0-15.0 (g/dL) Final     HCT  Date Value Range Status  11/27/2011 28.8* 36.0-46.0 (%) Final    Physical Exam:  General: alert, cooperative and appears stated age 31: appropriate Uterine Fundus: firm  Discharge Diagnoses: Term Pregnancy-delivered and Post-date pregnancy  Discharge Information: Date: 11/28/2011 Activity: pelvic rest Diet: routine Medications: Ibuprofen, Colace and Vicodin Condition: stable Instructions: refer to practice specific booklet Discharge to: home   Newborn Data: Live born female  Birth Weight: 7 lb 5.8 oz (3340 g) APGAR: 9, 9  Home with mother.  Cabe Lashley H. 11/28/2011, 10:37 AM

## 2011-11-30 LAB — TYPE AND SCREEN
Antibody Screen: NEGATIVE
Unit division: 0

## 2012-08-16 ENCOUNTER — Encounter (HOSPITAL_COMMUNITY): Payer: Self-pay | Admitting: Emergency Medicine

## 2012-08-16 ENCOUNTER — Emergency Department (HOSPITAL_COMMUNITY)
Admission: EM | Admit: 2012-08-16 | Discharge: 2012-08-16 | Disposition: A | Discharge status: Home / Self Care | Payer: Managed Care, Other (non HMO) | Attending: Emergency Medicine | Admitting: Emergency Medicine

## 2012-08-16 DIAGNOSIS — H6690 Otitis media, unspecified, unspecified ear: Secondary | ICD-10-CM

## 2012-08-16 DIAGNOSIS — Z8619 Personal history of other infectious and parasitic diseases: Secondary | ICD-10-CM | POA: Insufficient documentation

## 2012-08-16 DIAGNOSIS — Z8742 Personal history of other diseases of the female genital tract: Secondary | ICD-10-CM | POA: Insufficient documentation

## 2012-08-16 DIAGNOSIS — J3489 Other specified disorders of nose and nasal sinuses: Secondary | ICD-10-CM | POA: Insufficient documentation

## 2012-08-16 MED ORDER — AMOXICILLIN-POT CLAVULANATE 875-125 MG PO TABS: 1.0000 | ORAL_TABLET | Freq: Two times a day (BID) | ORAL | Status: DC

## 2012-08-16 NOTE — ED Notes (Signed)
Started 1 week ago with URI. Friday started with right ear pain and "feeling stopped up". Went to UC on [address] x 3. Pt is [redacted] weeks PREGNANT.

## 2012-08-16 NOTE — ED Provider Notes (Signed)
History     CSN: [id number]  Arrival date & time 08/16/12  1249   First MD Initiated Contact with Patient 08/16/12 1353      No chief complaint on file.   (Consider location/radiation/quality/duration/timing/severity/associated sxs/prior treatment) HPI Comments: Patient is a 32 year old who presents with a 3 day history of right ear pain. Symptoms started gradually and progressively worsened since the onset. The pain is aching and severe. The pain does not radiate. Patient reports having a URI prior to developing ear pain. Patient has not tried anything for symptoms. No associated symptoms.    Past Medical History  Diagnosis Date  . Herpes     never had outbreak. pos per blood, doesn't know which type  . Polycystic ovarian syndrome   . Abnormal Pap smear 2006    leep  . History of chicken pox     Past Surgical History  Procedure Date  . Knee surgery 1998    right knee  . Leep 2006  . Hysteroscopy 2009    uterine polyp  . Sphincterotomy 2010  . Ivf retrival 2010, 2011  . Uterine polyp removal     Family History  Problem Relation Age of Onset  . Diabetes Maternal Grandmother   . Heart disease Maternal Grandmother   . Hypertension Maternal Grandfather   . Cancer Maternal Grandfather     bone, colon   . Diabetes Maternal Grandfather   . Diabetes Mother   . Cancer Mother     breast cancer  . Anesthesia problems Neg Hx   . Hypertension Father     History  Substance Use Topics  . Smoking status: Never Smoker   . Smokeless tobacco: Never Used  . Alcohol Use: No    OB History    Grav Para Term Preterm Abortions TAB SAB Ect Mult Living   3 1 1  0 1 0 0 1 0 1      Review of Systems  HENT: Positive for congestion.   Eyes: Positive for pain.  All other systems reviewed and are negative.    Allergies  Review of patient's allergies indicates no known allergies.  Home Medications   Current Outpatient Rx  Name  Route  Sig  Dispense  Refill  . ACETAMINOPHEN  500 MG PO TABS   Oral   Take 1,000 mg by mouth every 6 (six) hours as needed. For pain         . PHENYLEPHRINE-APAP-GUAIFENESIN 5-325-200 MG PO TABS   Oral   Take 2 tablets by mouth daily as needed. For sinus congestion         . PRENATAL MULTIVITAMIN CH   Oral   Take 1 tablet by mouth every morning.           BP 91/60  Pulse 85  Temp 97.5 F (36.4 C) (Oral)  Resp 12  SpO2 99%  LMP 02/14/2011  Physical Exam  Nursing note and vitals reviewed. Constitutional: She appears well-developed and well-nourished. No distress.  HENT:  Head: Normocephalic and atraumatic.  Right Ear: External ear normal.  Left Ear: External ear normal.  Mouth/Throat: Oropharynx is clear and moist. No oropharyngeal exudate.       Right mastoid process tenderness to palpation. No left mastoid process tenderness to palpation. TM intact bilaterally.   Eyes: Conjunctivae normal are normal.  Neck: Normal range of motion.  Cardiovascular: Normal rate and regular rhythm.  Exam reveals no gallop and no friction rub.   No murmur heard. Pulmonary/Chest:  Effort normal and breath sounds normal. She has no wheezes. She has no rales. She exhibits no tenderness.  Abdominal: Soft. There is no tenderness.  Musculoskeletal: Normal range of motion.  Lymphadenopathy:    She has no cervical adenopathy.  Neurological: She is alert.       Speech is goal-oriented. Moves limbs without ataxia.   Skin: Skin is warm and dry.  Psychiatric: She has a normal mood and affect. Her behavior is normal.    ED Course  Procedures (including critical care time)  Labs Reviewed - No data to display No results found.   1. Otitis media       MDM  1:57 PM Patient has otitis media. I will discharge her with augmentin which is category B for pregnancy. Patient instructed to return with worsening or concerning symptoms. No further evaluation needed at this time.        Emilia Beck, [place] 08/18/12 [phone]

## 2012-08-20 NOTE — ED Provider Notes (Signed)
Medical screening examination/treatment/procedure(s) were performed by non-physician practitioner and as supervising physician I was immediately available for consultation/collaboration.   Flint Melter, MD 08/20/12 1626

## 2012-09-13 ENCOUNTER — Encounter (HOSPITAL_COMMUNITY): Payer: Self-pay | Admitting: Obstetrics and Gynecology

## 2012-09-13 NOTE — H&P (Signed)
Brenda Williams is an 32 y.o. female. Approximately 11.5 weeks by early U/S. U/S in office on 09/13/12 C/W 9 wks 2 days and no fetal heart beat.  Patient denies bleeding and crampiing.  Pertinent Gynecological History: Menses: pregnant Bleeding: N/A Contraception: none DES exposure: denies Blood transfusions: none Sexually transmitted diseases: no past history Previous GYN Procedures: none  Last mammogram: not done Date: N/A Last pap: normal Date: 2013 OB History: G3, P1   Menstrual History: Menarche age: unknown  No LMP recorded. Patient is pregnant.    Past Medical History  Diagnosis Date  . Herpes     never had outbreak. pos per blood, doesn't know which type  . Polycystic ovarian syndrome   . Abnormal Pap smear 2006    leep  . History of chicken pox   . SVD (spontaneous vaginal delivery)     x 1    Past Surgical History  Procedure Laterality Date  . Knee surgery  1998    right knee  . Leep  2006  . Hysteroscopy  2009    uterine polyp  . Sphincterotomy  2010  . Ivf retrival  2010, 2011  . Uterine polyp removal      Family History  Problem Relation Age of Onset  . Diabetes Maternal Grandmother   . Heart disease Maternal Grandmother   . Hypertension Maternal Grandfather   . Cancer Maternal Grandfather     bone, colon   . Diabetes Maternal Grandfather   . Diabetes Mother   . Cancer Mother     breast cancer  . Anesthesia problems Neg Hx   . Hypertension Father     Social History:  reports that she has never smoked. She has never used smokeless tobacco. She reports that she does not drink alcohol or use illicit drugs.  Allergies: No Known Allergies  No prescriptions prior to admission    Review of Systems  Constitutional: Negative for fever.  Gastrointestinal: Negative for abdominal pain.  Neurological: Negative for headaches.    Height 5\' 7"  (1.702 m), weight 240 lb (108.863 kg), not currently breastfeeding. Physical Exam  Constitutional: She  appears well-developed and well-nourished.  Cardiovascular: Normal rate and regular rhythm.   Respiratory: Effort normal and breath sounds normal.  GI: There is no tenderness.  Genitourinary:  Cervix Closed and Thick Uterus about 8-10 weeks size    No results found for this or any previous visit (from the past 24 hour(s)).  No results found.  Assessment/Plan: 32 yo MBF G3P1 with SAB. D/W options, patient favors D&E. Reviewed risks including infection, uterine perforation, organ damage, bleeding with transfusion-HIV/Hep, DVT/PE, pneumonia, IU synechia and secondary infertility All questions answered.  TOMBLIN II,JAMES E 09/13/2012, 4:50 PM

## 2012-09-14 ENCOUNTER — Ambulatory Visit (HOSPITAL_COMMUNITY)
Admission: RE | Admit: 2012-09-14 | Discharge: 2012-09-14 | Disposition: A | Discharge status: Home / Self Care | Payer: Managed Care, Other (non HMO) | Source: Ambulatory Visit | Attending: Obstetrics and Gynecology | Admitting: Obstetrics and Gynecology

## 2012-09-14 ENCOUNTER — Ambulatory Visit (HOSPITAL_COMMUNITY): Payer: Managed Care, Other (non HMO)

## 2012-09-14 ENCOUNTER — Encounter (HOSPITAL_COMMUNITY): Payer: Self-pay | Admitting: Anesthesiology

## 2012-09-14 ENCOUNTER — Encounter (HOSPITAL_COMMUNITY): Payer: Self-pay | Admitting: Pharmacist

## 2012-09-14 ENCOUNTER — Ambulatory Visit (HOSPITAL_COMMUNITY): Payer: Managed Care, Other (non HMO) | Admitting: Anesthesiology

## 2012-09-14 ENCOUNTER — Encounter (HOSPITAL_COMMUNITY)
Admission: RE | Disposition: A | Discharge status: Home / Self Care | Payer: Self-pay | Source: Ambulatory Visit | Attending: Obstetrics and Gynecology

## 2012-09-14 DIAGNOSIS — R911 Solitary pulmonary nodule: Secondary | ICD-10-CM | POA: Insufficient documentation

## 2012-09-14 DIAGNOSIS — I4949 Other premature depolarization: Secondary | ICD-10-CM | POA: Insufficient documentation

## 2012-09-14 DIAGNOSIS — R569 Unspecified convulsions: Secondary | ICD-10-CM | POA: Insufficient documentation

## 2012-09-14 DIAGNOSIS — O021 Missed abortion: Secondary | ICD-10-CM | POA: Insufficient documentation

## 2012-09-14 HISTORY — PX: DILATION AND EVACUATION: SHX1459

## 2012-09-14 LAB — CBC
MCH: 29.2 pg (ref 26.0–34.0)
MCV: 87.2 fL (ref 78.0–100.0)
Platelets: 302 10*3/uL (ref 150–400)
RBC: 4.31 MIL/uL (ref 3.87–5.11)

## 2012-09-14 SURGERY — DILATION AND EVACUATION, UTERUS
Anesthesia: Monitor Anesthesia Care | Site: Vagina | Wound class: Clean Contaminated

## 2012-09-14 MED ORDER — OXYCODONE-ACETAMINOPHEN 5-325 MG PO TABS: 2.0000 | ORAL_TABLET | Freq: Four times a day (QID) | ORAL | Status: DC | PRN

## 2012-09-14 MED ORDER — MIDAZOLAM HCL 2 MG/2ML IJ SOLN
INTRAMUSCULAR | Status: AC
  Filled 2012-09-14: qty 2

## 2012-09-14 MED ORDER — PROPOFOL 10 MG/ML IV EMUL
INTRAVENOUS | Status: DC | PRN
  Administered 2012-09-14: 100 ug/kg/min via INTRAVENOUS

## 2012-09-14 MED ORDER — KETOROLAC TROMETHAMINE 30 MG/ML IJ SOLN
INTRAMUSCULAR | Status: AC
  Filled 2012-09-14: qty 1

## 2012-09-14 MED ORDER — FENTANYL CITRATE 0.05 MG/ML IJ SOLN
INTRAMUSCULAR | Status: AC
  Filled 2012-09-14: qty 4

## 2012-09-14 MED ORDER — LIDOCAINE HCL (CARDIAC) 20 MG/ML IV SOLN
INTRAVENOUS | Status: AC
  Filled 2012-09-14: qty 5

## 2012-09-14 MED ORDER — LIDOCAINE HCL (CARDIAC) 20 MG/ML IV SOLN
INTRAVENOUS | Status: DC | PRN
  Administered 2012-09-14: 80 mg via INTRAVENOUS
  Administered 2012-09-14 (×2): 10 mg via INTRAVENOUS

## 2012-09-14 MED ORDER — ONDANSETRON HCL 4 MG/2ML IJ SOLN
INTRAMUSCULAR | Status: DC | PRN
  Administered 2012-09-14: 4 mg via INTRAVENOUS

## 2012-09-14 MED ORDER — OXYCODONE-ACETAMINOPHEN 5-325 MG PO TABS: 1.0000 | ORAL_TABLET | ORAL | Status: DC | PRN

## 2012-09-14 MED ORDER — FENTANYL CITRATE 0.05 MG/ML IJ SOLN
INTRAMUSCULAR | Status: AC
  Administered 2012-09-14: 50 ug via INTRAVENOUS
  Filled 2012-09-14: qty 2

## 2012-09-14 MED ORDER — MIDAZOLAM HCL 5 MG/5ML IJ SOLN
INTRAMUSCULAR | Status: DC | PRN
  Administered 2012-09-14 (×2): 2 mg via INTRAVENOUS

## 2012-09-14 MED ORDER — CEFAZOLIN SODIUM-DEXTROSE 2-3 GM-% IV SOLR
2.0000 g | INTRAVENOUS | Status: AC
  Administered 2012-09-14: 2 g via INTRAVENOUS

## 2012-09-14 MED ORDER — OXYTOCIN 10 UNIT/ML IJ SOLN
INTRAMUSCULAR | Status: AC
  Filled 2012-09-14: qty 2

## 2012-09-14 MED ORDER — PROPOFOL 10 MG/ML IV EMUL
INTRAVENOUS | Status: DC | PRN
  Administered 2012-09-14 (×2): 30 mg via INTRAVENOUS
  Administered 2012-09-14: 20 mg via INTRAVENOUS
  Administered 2012-09-14: 10 mg via INTRAVENOUS

## 2012-09-14 MED ORDER — FENTANYL CITRATE 0.05 MG/ML IJ SOLN
INTRAMUSCULAR | Status: DC | PRN
  Administered 2012-09-14 (×2): 50 ug via INTRAVENOUS
  Administered 2012-09-14: 25 ug via INTRAVENOUS

## 2012-09-14 MED ORDER — IOHEXOL 350 MG/ML SOLN
100.0000 mL | Freq: Once | INTRAVENOUS | Status: AC | PRN
  Administered 2012-09-14: 100 mL via INTRAVENOUS

## 2012-09-14 MED ORDER — LACTATED RINGERS IV SOLN
INTRAVENOUS | Status: DC
Start: 2012-09-14 — End: 2012-09-14
  Administered 2012-09-14 (×3): via INTRAVENOUS

## 2012-09-14 MED ORDER — DEXAMETHASONE SODIUM PHOSPHATE 10 MG/ML IJ SOLN
INTRAMUSCULAR | Status: AC
  Filled 2012-09-14: qty 1

## 2012-09-14 MED ORDER — OXYCODONE-ACETAMINOPHEN 5-325 MG PO TABS
ORAL_TABLET | ORAL | Status: AC
  Administered 2012-09-14: 1 via ORAL
  Filled 2012-09-14: qty 1

## 2012-09-14 MED ORDER — PROPOFOL 10 MG/ML IV EMUL
INTRAVENOUS | Status: AC
  Filled 2012-09-14: qty 40

## 2012-09-14 MED ORDER — ONDANSETRON HCL 4 MG/2ML IJ SOLN
INTRAMUSCULAR | Status: AC
  Filled 2012-09-14: qty 2

## 2012-09-14 MED ORDER — METOCLOPRAMIDE HCL 5 MG/ML IJ SOLN: 10.0000 mg | Freq: Once | INTRAMUSCULAR | Status: DC | PRN

## 2012-09-14 MED ORDER — LIDOCAINE HCL 1 % IJ SOLN
INTRAMUSCULAR | Status: DC | PRN
  Administered 2012-09-14: 20 mL

## 2012-09-14 MED ORDER — FENTANYL CITRATE 0.05 MG/ML IJ SOLN
25.0000 ug | INTRAMUSCULAR | Status: DC | PRN
  Administered 2012-09-14: 25 ug via INTRAVENOUS

## 2012-09-14 MED ORDER — MEPERIDINE HCL 25 MG/ML IJ SOLN: 6.2500 mg | INTRAMUSCULAR | Status: DC | PRN

## 2012-09-14 SURGICAL SUPPLY — 19 items
CATH ROBINSON RED A/P 16FR (CATHETERS) ×2 IMPLANT
CLOTH BEACON ORANGE TIMEOUT ST (SAFETY) ×2 IMPLANT
DECANTER SPIKE VIAL GLASS SM (MISCELLANEOUS) ×2 IMPLANT
GLOVE BIO SURGEON STRL SZ8 (GLOVE) ×4 IMPLANT
GOWN STRL REIN XL XLG (GOWN DISPOSABLE) ×4 IMPLANT
KIT BERKELEY 1ST TRIMESTER 3/8 (MISCELLANEOUS) ×2 IMPLANT
NDL SPNL 22GX3.5 QUINCKE BK (NEEDLE) ×1 IMPLANT
NEEDLE SPNL 22GX3.5 QUINCKE BK (NEEDLE) ×2 IMPLANT
NS IRRIG 1000ML POUR BTL (IV SOLUTION) ×2 IMPLANT
PACK VAGINAL MINOR WOMEN LF (CUSTOM PROCEDURE TRAY) ×2 IMPLANT
PAD OB MATERNITY 4.3X12.25 (PERSONAL CARE ITEMS) ×2 IMPLANT
PAD PREP 24X48 CUFFED NSTRL (MISCELLANEOUS) ×2 IMPLANT
SET BERKELEY SUCTION TUBING (SUCTIONS) ×2 IMPLANT
SYR CONTROL 10ML LL (SYRINGE) ×2 IMPLANT
TOWEL OR 17X24 6PK STRL BLUE (TOWEL DISPOSABLE) ×4 IMPLANT
VACURETTE 10 RIGID CVD (CANNULA) IMPLANT
VACURETTE 7MM CVD STRL WRAP (CANNULA) IMPLANT
VACURETTE 8 RIGID CVD (CANNULA) IMPLANT
VACURETTE 9 RIGID CVD (CANNULA) IMPLANT

## 2012-09-14 NOTE — Progress Notes (Signed)
Appreciate neuro consult Patient now feeling better.   Blood pressure 111/66, pulse 64, temperature 98.4 F (36.9 C), temperature source Oral, resp. rate 14, height 5\' 7"  (1.702 m), weight 240 lb (108.863 kg), SpO2 96.00%, not currently breastfeeding. Oriented x 3 NSR with PVCs  CT of head and chest noted  A: Seizure like activity emerging from anesthesia     PVCs     .5 cm nodule right lung-D/W patient  P: D/C home     Instructions given to patient-no operation of automobile      Will schedule neuro and cardio FU     Chest CT 6 months     FU office 1 week

## 2012-09-14 NOTE — OR Nursing (Signed)
Dr. Arby Barrette states at 1423 to hold head CT scan.  Radiology notified of CT order to be held.  Dr. Henderson Cloud aware of pt changes.

## 2012-09-14 NOTE — Transfer of Care (Signed)
Immediate Anesthesia Transfer of Care Note  Patient: Brenda Williams  Procedure(s) Performed: Procedure(s): DILATATION AND EVACUATION (N/A)  Patient Location: PACU  Anesthesia Type:MAC  Level of Consciousness: awake, alert , oriented and patient cooperative  Airway & Oxygen Therapy: Patient Spontanous Breathing and Patient connected to face mask oxygen  Post-op Assessment: Report given to PACU RN, Post -op Vital signs reviewed and stable and Patient moving all extremities X 4  Post vital signs: Reviewed and stable  Complications: No apparent anesthesia complications

## 2012-09-14 NOTE — Progress Notes (Signed)
Called back to OR  When I left OR, patient was stable. She had PVC with NSR on EKG and plan was outpatient FU. I was told that upon emerging from MAC, patient jumped up to head of table.  Dr Arby Barrette was called. He stated at one point she had disconjugate eye movements and did not respond to stimuli. I was in my office building when I was called. I returned immediately. When I arrived back into OR 7, the patient was on a transport gurney. She had NSR with occassional PVC, good oxygen saturation, DTR in LE 2+ and equal, face symmetric. When asked "where are you?" patient responded "hospital". Now in PACU. VSS afeb. Patient is lethargic but recognizes me and responded appropriately. D/W Dr Binnie Kand, neuro.  Will get head CT. Patient not yet awaked enough for chest CT PE protocol. I D/W husband above.  He asked me to keep him informed on telephone.

## 2012-09-14 NOTE — Brief Op Note (Signed)
09/14/2012  1:51 PM  PATIENT:  Brenda Williams  32 y.o. female  PRE-OPERATIVE DIAGNOSIS:  missed ab cpt 59820  POST-OPERATIVE DIAGNOSIS:  missed abortion  PROCEDURE:  Procedure(s): DILATATION AND EVACUATION (N/A)  SURGEON:  Surgeon(s) and Role:    * Leslie Andrea, MD - Primary  PHYSICIAN ASSISTANT:   ASSISTANTS: none   ANESTHESIA:   MAC  EBL:     BLOOD ADMINISTERED:none  DRAINS: none   LOCAL MEDICATIONS USED:  LIDOCAINE   SPECIMEN:  Source of Specimen:  products of conception  DISPOSITION OF SPECIMEN:  PATHOLOGY  COUNTS:  YES  TOURNIQUET:  * No tourniquets in log *  DICTATION: .Other Dictation: Dictation Number [id number]  PLAN OF CARE: Discharge to home after PACU  PATIENT DISPOSITION:  PACU - hemodynamically stable.   Delay start of Pharmacological VTE agent (>24hrs) due to surgical blood loss or risk of bleeding: not applicable

## 2012-09-14 NOTE — Anesthesia Preprocedure Evaluation (Signed)
Anesthesia Evaluation  Patient identified by MRN, date of birth, ID band Patient awake    Reviewed: Allergy & Precautions, H&P , NPO status , Patient's Chart, lab work & pertinent test results  Airway Mallampati: II TM Distance: >3 FB Neck ROM: Full    Dental no notable dental hx. (+) Teeth Intact   Pulmonary neg pulmonary ROS,  breath sounds clear to auscultation  Pulmonary exam normal       Cardiovascular negative cardio ROS  Rhythm:Regular Rate:Normal     Neuro/Psych negative neurological ROS  negative psych ROS   GI/Hepatic negative GI ROS, Neg liver ROS,   Endo/Other  PCOS  Renal/GU negative Renal ROS  negative genitourinary   Musculoskeletal negative musculoskeletal ROS (+)   Abdominal (+) + obese,   Peds  Hematology negative hematology ROS (+)   Anesthesia Other Findings   Reproductive/Obstetrics (+) Pregnancy Missed Abortion                           Anesthesia Physical Anesthesia Plan  ASA: II  Anesthesia Plan: General and MAC   Post-op Pain Management:    Induction: Intravenous  Airway Management Planned: Natural Airway  Additional Equipment:   Intra-op Plan:   Post-operative Plan:   Informed Consent: I have reviewed the patients History and Physical, chart, labs and discussed the procedure including the risks, benefits and alternatives for the proposed anesthesia with the patient or authorized representative who has indicated his/her understanding and acceptance.   Dental advisory given  Plan Discussed with: CRNA, Anesthesiologist and Surgeon  Anesthesia Plan Comments:         Anesthesia Quick Evaluation

## 2012-09-14 NOTE — Progress Notes (Signed)
No changes to H&P per patient history. Reviewed with patient procedure. 

## 2012-09-14 NOTE — Consult Note (Signed)
NEURO HOSPITALIST CONSULT NOTE    Reason for Consult: Seizure like activity after surgery  HPI:                                                                                                                                          Brenda Williams is an 32 y.o. female who was brought to the OR at [facility] hospital after no fetal heart rate was noted on [place]. Patient underwent a D&E. Patient was noted to have a seizure like event post operatively. Neurology was called to evaluate patient.  Patient has no history of seizure, febrile seizure and no family history of seizure. From both nurse anesthesia and MD it was describes as a fulll body (truncal greater than appendicular) jerking, no bowel or bladder incontinence, no tongue biting, no eye deviation(eyes were disconjugate). Patient is now drowsy but has received versed. She is now able to follow all commands, answer all questions.    Of note: she recently has had loss in husbands family side and now loss of her child.    Past Medical History  Diagnosis Date  . Herpes     never had outbreak. pos per blood, doesn't know which type  . Polycystic ovarian syndrome   . Abnormal Pap smear 2006    leep  . History of chicken pox   . SVD (spontaneous vaginal delivery)     x 1    Past Surgical History  Procedure Laterality Date  . Knee surgery  1998    right knee  . Leep  2006  . Hysteroscopy  2009    uterine polyp  . Sphincterotomy  2010  . Ivf retrival  2010, 2011  . Uterine polyp removal      Family History  Problem Relation Age of Onset  . Diabetes Maternal Grandmother   . Heart disease Maternal Grandmother   . Hypertension Maternal Grandfather   . Cancer Maternal Grandfather     bone, colon   . Diabetes Maternal Grandfather   . Diabetes Mother   . Cancer Mother     breast cancer  . Anesthesia problems Neg Hx   . Hypertension Father      Social History:  reports that she has never smoked. She has never  used smokeless tobacco. She reports that she does not drink alcohol or use illicit drugs.  No Known Allergies  MEDICATIONS:  Prior to Admission:  Prescriptions prior to admission  Medication Sig Dispense Refill  . acetaminophen (TYLENOL) 500 MG tablet Take 1,000 mg by mouth every 6 (six) hours as needed. For pain      . Phenylephrine-APAP-Guaifenesin (MUCINEX SINUS-MAX CONGESTION) 5-325-200 MG TABS Take 2 tablets by mouth daily as needed. For sinus congestion      . [DISCONTINUED] Prenatal Vit-Fe Fumarate-FA (PRENATAL MULTIVITAMIN) TABS Take 1 tablet by mouth every morning.       Scheduled:    ROS:                                                                                                                                       History obtained from the patient  General ROS: negative for - chills, fatigue, fever, night sweats, weight gain or weight loss Psychological ROS: negative for - behavioral disorder, hallucinations, memory difficulties, mood swings or suicidal ideation Ophthalmic ROS: negative for - blurry vision, double vision, eye pain or loss of vision ENT ROS: negative for - epistaxis, nasal discharge, oral lesions, sore throat, tinnitus or vertigo Allergy and Immunology ROS: negative for - hives or itchy/watery eyes Hematological and Lymphatic ROS: negative for - bleeding problems, bruising or swollen lymph nodes Endocrine ROS: negative for - galactorrhea, hair pattern changes, polydipsia/polyuria or temperature intolerance Respiratory ROS: negative for - cough, hemoptysis, shortness of breath or wheezing Cardiovascular ROS: negative for - chest pain, dyspnea on exertion, edema or irregular heartbeat Gastrointestinal ROS: negative for - abdominal pain, diarrhea, hematemesis, nausea/vomiting or stool incontinence Genito-Urinary ROS: negative for - dysuria,  hematuria, incontinence or urinary frequency/urgency Musculoskeletal ROS: negative for - joint swelling or muscular weakness Neurological ROS: as noted in HPI Dermatological ROS: negative for rash and skin lesion changes   Blood pressure 127/82, pulse 60, temperature 97.2 F (36.2 C), temperature source Oral, resp. rate 15, height 5\' 7"  (1.702 m), weight 108.863 kg (240 lb), SpO2 100.00%, not currently breastfeeding.   Neurologic Examination:                                                                                                      Mental Status: Alert, oriented, thought content appropriate.  Speech fluent without evidence of aphasia.  Able to follow 3 step commands without difficulty. Cranial Nerves: II: Discs flat bilaterally; Visual fields grossly normal, pupils equal, round, reactive to light and accommodation III,IV, VI: ptosis not present, extra-ocular motions intact bilaterally V,VII: smile symmetric, facial light touch sensation normal bilaterally VIII:  hearing normal bilaterally IX,X: gag reflex present XI: bilateral shoulder shrug XII: midline tongue extension Motor: Right : Upper extremity   5/5    Left:     Upper extremity   5/5  Lower extremity   5/5     Lower extremity   5/5 Tone and bulk:normal tone throughout; no atrophy noted Sensory: Pinprick and light touch intact throughout, bilaterally Deep Tendon Reflexes: 2+ and symmetric throughout Plantars: Right: downgoing   Left: downgoing Cerebellar: normal finger-to-nose,  normal heel-to-shin test CV: pulses palpable throughout    No components found with this basename: cbc,  bmp,  coags,  chol,  tri,  ldl,  hga1c    Results for orders placed during the hospital encounter of 09/14/12 (from the past 48 hour(s))  CBC     Status: None   Collection Time    09/14/12 12:29 PM      Result Value Range   WBC 5.6  4.0 - 10.5 K/uL   RBC 4.31  3.87 - 5.11 MIL/uL   Hemoglobin 12.6  12.0 - 15.0 g/dL   HCT 16.1   09.6 - 04.5 %   MCV 87.2  78.0 - 100.0 fL   MCH 29.2  26.0 - 34.0 pg   MCHC 33.5  30.0 - 36.0 g/dL   RDW 40.9  81.1 - 91.4 %   Platelets 302  150 - 400 K/uL    Ct Head Wo Contrast  09/14/2012  *RADIOLOGY REPORT*  Clinical Data: 31 year old female with possible seizure activity.  CT HEAD WITHOUT CONTRAST  Technique:  Contiguous axial images were obtained from the base of the skull through the vertex without contrast.  Comparison: None.  Findings: Visualized paranasal sinuses and mastoids are clear.  No acute osseous abnormality identified.  Visualized orbits and scalp soft tissues are within normal limits.  Intermittent dural calcifications.  Normal cerebral volume.  No ventriculomegaly. No midline shift, mass effect, or evidence of mass lesion.  Gray-white matter differentiation is within normal limits throughout the brain.  No evidence of cortically based acute infarction identified.  No acute intracranial hemorrhage identified.  No suspicious intracranial vascular hyperdensity.  IMPRESSION: Negative noncontrast CT appearance of the brain.   Original Report Authenticated By: Erskine Speed, M.D.      Assessment/Plan: 32 YO female with seizure like activity post D&E.  CT head negative. On exam patient is drowsy but able to follow all commands, answer all questions and showing no focal neurological findings. Patient has no history of seizure or febrile seizure as a child (that she knows of).  Etiology of seizure like activity unclear.  Unless patient has further episodes would not initiate AED at this time. If patient returns to baseline would have EEG done as out patient which may be followed by out patient neurology.  If patient is going to be admitted overnight would consider EEG obtained as in patient and may be followed by out patient neurology.   No driving, operating heavy machinery, perform activities at heights, swimming or participation in water activities until release by outpatient physician.      Discussed with Dr. Henderson Cloud  Assessment and plan discussed with with attending physician and they are in agreement.    Felicie Morn PA-C [facility] [phone]  09/14/2012, 3:28 PM  Patient seen and examined together with physician assistant and I concur with the assessment and plan.  Wyatt Portela, MD

## 2012-09-14 NOTE — Anesthesia Postprocedure Evaluation (Signed)
Anesthesia Post Note  Patient: Brenda Williams  Procedure(s) Performed: Procedure(s) (LRB): DILATATION AND EVACUATION (N/A)  Anesthesia type: MAC  Patient location: PACU  Post pain: Pain level controlled  Post assessment: Post-op Vital signs reviewed  Last Vitals:  Filed Vitals:   09/14/12 1815  BP: 95/49  Pulse: 71  Temp: 36.8 C  Resp: 20    Post vital signs: Reviewed  Level of consciousness: sedated  Complications: No apparent anesthesia complications

## 2012-09-15 ENCOUNTER — Encounter (HOSPITAL_COMMUNITY): Payer: Self-pay | Admitting: Obstetrics and Gynecology

## 2012-09-15 NOTE — Op Note (Signed)
Brenda Williams, Brenda Williams              ACCOUNT NO.:  [id number]  MEDICAL RECORD NO.:  [id number]  LOCATION:  [facility]                          FACILITY:  [facility]  PHYSICIAN:  Guy Sandifer. Henderson Cloud, M.D. DATE OF BIRTH:  [birth year]  DATE OF PROCEDURE:  09/14/2012 DATE OF DISCHARGE:  09/14/2012                              OPERATIVE REPORT   PREOPERATIVE DIAGNOSIS:  Missed abortion.  POSTOPERATIVE DIAGNOSIS:  Missed abortion.  PROCEDURE:  Dilatation and evacuation.  SURGEON:  Guy Sandifer. Henderson Cloud, M.D.  ANESTHESIA:  MAC.  ESTIMATED BLOOD LOSS:  Less than 100 mL.  SPECIMENS:  Products of conception to pathology.  INDICATIONS AND CONSENT:  This patient is a 32 year old married black female, G3, P1, who has date set by her early ultrasound.  Yesterday, in the office, ultrasound was consistent with a crown-rump length of approximately 9 weeks and 2 days and no fetal heartbeat on prolonged examination.  Options were reviewed and the patient elects dilatation and evacuation.  Potential risks and complications were reviewed preoperatively including but not limited to, infection, uterine perforation, organ damage, bleeding requiring transfusion of blood products with HIV and hepatitis acquisition, DVT, PE, pneumonia, laparotomy, intrauterine synechiae, and secondary infertility.  Consent is signed on the chart.  DESCRIPTION OF PROCEDURE:  The patient was taken to operating room, where she was identified and placed in dorsal supine position, and anesthesia was induced intravenously.  She is placed in dorsal lithotomy position.  Time-out undertaken.  She was prepped, bladder straight catheterized, and draped in sterile fashion.  Bivalve speculum was placed in the vagina.  The anterior cervical lip was injected with 1% Xylocaine plain.  It was then grasped with a single-tooth tenaculum. Paracervical block was placed at the 2, 4, 5, 7, 8, and 10 o'clock positions with approximately 20 mL of the same  solution.  Cervix was gently progressively dilated.  Then, using a #9 curved suction curette, it was placed in the endocervical canal and gently advanced.  Suction curettage was carried out for products of conception.  20 units of Pitocin per liter of IV fluids were then started.  Alternating sharp and suction curettage was carried out. The cavity was clean and there was good hemostasis.  Instruments were removed.  All counts correct.  The patient was awakened and taken to recovery room in stable condition.     Guy Sandifer Henderson Cloud, M.D.     JET/MEDQ  D:  09/14/2012  T:  09/15/2012  Job:  [id number]

## 2012-10-05 ENCOUNTER — Encounter: Payer: Self-pay | Admitting: Cardiovascular Disease

## 2012-10-05 ENCOUNTER — Ambulatory Visit (INDEPENDENT_AMBULATORY_CARE_PROVIDER_SITE_OTHER): Payer: Managed Care, Other (non HMO) | Admitting: Cardiovascular Disease

## 2012-10-05 VITALS — BP 108/86 | HR 73 | Ht 67.0 in | Wt 226.0 lb

## 2012-10-05 DIAGNOSIS — I491 Atrial premature depolarization: Secondary | ICD-10-CM

## 2012-10-05 DIAGNOSIS — I493 Ventricular premature depolarization: Secondary | ICD-10-CM

## 2012-10-05 DIAGNOSIS — I4949 Other premature depolarization: Secondary | ICD-10-CM

## 2012-10-05 LAB — BASIC METABOLIC PANEL
BUN: 10 mg/dL (ref 6–23)
Chloride: 107 mEq/L (ref 96–112)
Potassium: 4 mEq/L (ref 3.5–5.1)

## 2012-10-05 LAB — MAGNESIUM: Magnesium: 2.1 mg/dL (ref 1.5–2.5)

## 2012-10-05 NOTE — Patient Instructions (Addendum)
LABS TODAY:  BMET, MAGNESIUM, CALCIUM  Your physician has requested that you have an echocardiogram. Echocardiography is a painless test that uses sound waves to create images of your heart. It provides your doctor with information about the size and shape of your heart and how well your heart's chambers and valves are working. This procedure takes approximately one hour. There are no restrictions for this procedure.  Your physician recommends that you schedule a follow-up appointment as needed with Dr. Excell Seltzer.

## 2012-10-06 ENCOUNTER — Ambulatory Visit (HOSPITAL_COMMUNITY): Payer: Managed Care, Other (non HMO)

## 2012-10-06 DIAGNOSIS — I4949 Other premature depolarization: Secondary | ICD-10-CM | POA: Insufficient documentation

## 2012-10-06 NOTE — Progress Notes (Signed)
Echocardiogram performed.  

## 2012-10-07 ENCOUNTER — Encounter: Payer: Self-pay | Admitting: Cardiovascular Disease

## 2012-10-07 NOTE — Progress Notes (Signed)
HPI:  32 year old woman presenting for evaluation of PVCs. She has no history of cardiac disease. She underwent dilatation and evacuation March 4. Postoperatively she was noted to have seizure-like activity. She underwent neurologic evaluation that was unrevealing. She was noted to have PVCs on the monitor, but there is no record of sustained arrhythmia. Outpatient cardiology evaluation was recommended. A CT angiogram of the chest was done and this showed no pulmonary embolus. Cardiac size was noted to be normal. There is a 0.5 cm right middle lobe pulmonary nodule and followup CT was recommended that a 6-12 month interval.  The patient complains of shortness of breath with activity during her pregnancy. She denies chest pain or pressure. She denies palpitations, lightheadedness, or syncope. She's had no leg edema. She denies PND, but has had orthopnea.  She has no family history of myocardial infarction or sudden cardiac death.  Outpatient Encounter Prescriptions as of 10/05/2012  Medication Sig Dispense Refill  . acetaminophen (TYLENOL) 500 MG tablet Take 1,000 mg by mouth every 6 (six) hours as needed. For pain      . [DISCONTINUED] oxyCODONE-acetaminophen (PERCOCET/ROXICET) 5-325 MG per tablet Take 2 tablets by mouth every 6 (six) hours as needed for pain.  6 tablet  0  . [DISCONTINUED] Phenylephrine-APAP-Guaifenesin (MUCINEX SINUS-MAX CONGESTION) 5-325-200 MG TABS Take 2 tablets by mouth daily as needed. For sinus congestion       No facility-administered encounter medications on file as of 10/05/2012.    Review of patient's allergies indicates no known allergies.  Past Medical History  Diagnosis Date  . Herpes     never had outbreak. pos per blood, doesn't know which type  . Polycystic ovarian syndrome   . Abnormal Pap smear 2006    leep  . History of chicken pox   . SVD (spontaneous vaginal delivery)     x 1    Past Surgical History  Procedure Laterality Date  . Knee surgery   1998    right knee  . Leep  2006  . Hysteroscopy  2009    uterine polyp  . Sphincterotomy  2010  . Ivf retrival  2010, 2011  . Uterine polyp removal    . Dilation and evacuation N/A 09/14/2012    Procedure: DILATATION AND EVACUATION;  Surgeon: Leslie Andrea, MD;  Location: [facility];  Service: Gynecology;  Laterality: N/A;    History   Social History  . Marital Status: Married    Spouse Name: N/A    Number of Children: N/A  . Years of Education: N/A   Occupational History  . Not on file.   Social History Main Topics  . Smoking status: Never Smoker   . Smokeless tobacco: Never Used  . Alcohol Use: No  . Drug Use: No  . Sexually Active: Yes    Birth Control/ Protection: None     Comment: approx [redacted] wks gestation per patient   Other Topics Concern  . Not on file   Social History Narrative  . No narrative on file    Family History  Problem Relation Age of Onset  . Diabetes Maternal Grandmother   . Heart disease Maternal Grandmother   . Hypertension Maternal Grandfather   . Cancer Maternal Grandfather     bone, colon   . Diabetes Maternal Grandfather   . Diabetes Mother   . Cancer Mother     breast cancer  . Anesthesia problems Neg Hx   . Hypertension Father    ROS:  General: no fevers/chills/night sweats Eyes: no blurry vision, diplopia, or amaurosis ENT: no sore throat or hearing loss Resp: no cough, wheezing, or hemoptysis CV: no edema or palpitations GI: no abdominal pain, nausea, vomiting, diarrhea, or constipation GU: no dysuria, frequency, or hematuria Skin: no rash Neuro: no headache, numbness, tingling, or weakness of extremities Musculoskeletal: no joint pain or swelling Heme: no bleeding, DVT, or easy bruising Endo: no polydipsia or polyuria  BP 108/86  Pulse 73  Ht 5\' 7"  (1.702 m)  Wt 102.513 kg (226 lb)  BMI 35.39 kg/m2  SpO2 99%  Breastfeeding? Unknown  PHYSICAL EXAM: Pt is alert and oriented, WD, WN, in no distress. HEENT:  normal Neck: JVP normal. Carotid upstrokes normal without bruits. No thyromegaly. Lungs: equal expansion, clear bilaterally CV: Apex is discrete and nondisplaced, RRR without murmur or gallop Abd: soft, NT, +BS, no bruit, no hepatosplenomegaly Back: no CVA tenderness Ext: no C/C/E        Femoral pulses 2+= without bruits        DP/PT pulses intact and = Skin: warm and dry without rash Neuro: CNII-XII intact             Strength intact = bilaterally  EKG:  Normal sinus rhythm, PVCs, mild QT prolongation, nonspecific T wave abnormality.  ASSESSMENT AND PLAN: 32 year old woman with no cardiac history presenting for evaluation of premature ventricular contractions. Her cardiac exam is benign. Her EKG is abnormal with an anterolateral T wave abnormality, mildly prolonged QT, and PVCs. There are no other associated "red flags" for prolonged QT syndrome, specifically there is no family history of sudden cardiac death and the patient has no personal history of syncope. We'll check a 2-D echocardiogram to evaluate for structural heart disease. I will review her EKG with one of my electrophysiology colleagues. Will also check a metabolic panel to include magnesium and calcium levels today. Follow-up pending echo result and review with EP physician.  Tonny Bollman 10/07/2012 5:51 AM

## 2012-12-10 ENCOUNTER — Ambulatory Visit (INDEPENDENT_AMBULATORY_CARE_PROVIDER_SITE_OTHER): Payer: Managed Care, Other (non HMO) | Admitting: Internal Medicine

## 2012-12-10 ENCOUNTER — Encounter: Payer: Self-pay | Admitting: Internal Medicine

## 2012-12-10 VITALS — BP 130/80 | HR 76 | Temp 98.2°F | Resp 20 | Ht 66.5 in | Wt 230.0 lb

## 2012-12-10 DIAGNOSIS — E785 Hyperlipidemia, unspecified: Secondary | ICD-10-CM

## 2012-12-10 DIAGNOSIS — E010 Iodine-deficiency related diffuse (endemic) goiter: Secondary | ICD-10-CM

## 2012-12-10 DIAGNOSIS — E049 Nontoxic goiter, unspecified: Secondary | ICD-10-CM

## 2012-12-10 LAB — LIPID PANEL
Cholesterol: 220 mg/dL — ABNORMAL HIGH (ref 0–200)
Total CHOL/HDL Ratio: 4
Triglycerides: 183 mg/dL — ABNORMAL HIGH (ref 0.0–149.0)

## 2012-12-10 NOTE — Progress Notes (Signed)
Subjective:    Patient ID: Brenda Williams, female    DOB: [birth year], 32 y.o.   MRN: [id number]  HPI  32 year old patient who is seen today to establish with our primary care practice. She has a fairly complicated gynecologic history but in general does quite well. She required a D&E in March of this year. Presently takes no chronic medications. In general she has done quite well only complaint today is some mild right knee pain. This doesn't seem to be limiting her activities. It sounds like she had arthroscopic surgery in 1998. She complains of weight gain. She has recently joined a health club  Past Medical History  Diagnosis Date  . Herpes     never had outbreak. pos per blood, doesn't know which type  . Polycystic ovarian syndrome   . Abnormal Pap smear 2006    leep  . History of chicken pox   . SVD (spontaneous vaginal delivery)     x 1    History   Social History  . Marital Status: Married    Spouse Name: N/A    Number of Children: N/A  . Years of Education: N/A   Occupational History  . Not on file.   Social History Main Topics  . Smoking status: Never Smoker   . Smokeless tobacco: Never Used  . Alcohol Use: No  . Drug Use: No  . Sexually Active: Yes    Birth Control/ Protection: None     Comment: approx [redacted] wks gestation per patient   Other Topics Concern  . Not on file   Social History Narrative  . No narrative on file    Past Surgical History  Procedure Laterality Date  . Knee surgery  1998    right knee  . Leep  2006  . Hysteroscopy  2009    uterine polyp  . Sphincterotomy  2010  . Ivf retrival  2010, 2011  . Uterine polyp removal    . Dilation and evacuation N/A 09/14/2012    Procedure: DILATATION AND EVACUATION;  Surgeon: Leslie Andrea, MD;  Location: [facility];  Service: Gynecology;  Laterality: N/A;    Family History  Problem Relation Age of Onset  . Diabetes Maternal Grandmother   . Heart disease Maternal Grandmother   . Hypertension  Maternal Grandfather   . Cancer Maternal Grandfather     bone, colon   . Diabetes Maternal Grandfather   . Diabetes Mother   . Cancer Mother     breast cancer  . Anesthesia problems Neg Hx   . Hypertension Father     No Known Allergies  No current outpatient prescriptions on file prior to visit.   No current facility-administered medications on file prior to visit.    BP 130/80  Pulse 76  Temp(Src) 98.2 F (36.8 C) (Oral)  Resp 20  Ht 5' 6.5" (1.689 m)  Wt 230 lb (104.327 kg)  BMI 36.57 kg/m2  SpO2 98%  LMP 12/07/2012  Breastfeeding? No       Review of Systems  Constitutional: Negative.   HENT: Negative for hearing loss, congestion, sore throat, rhinorrhea, dental problem, sinus pressure and tinnitus.   Eyes: Negative for pain, discharge and visual disturbance.  Respiratory: Negative for cough and shortness of breath.   Cardiovascular: Negative for chest pain, palpitations and leg swelling.  Gastrointestinal: Negative for nausea, vomiting, abdominal pain, diarrhea, constipation, blood in stool and abdominal distention.  Genitourinary: Negative for dysuria, urgency, frequency, hematuria, flank pain, vaginal  bleeding, vaginal discharge, difficulty urinating, vaginal pain and pelvic pain.  Musculoskeletal: Negative for joint swelling, arthralgias and gait problem.       Mild right knee pain  Skin: Negative for rash.  Neurological: Negative for dizziness, syncope, speech difficulty, weakness, numbness and headaches.  Hematological: Negative for adenopathy.  Psychiatric/Behavioral: Negative for behavioral problems, dysphoric mood and agitation. The patient is not nervous/anxious.        Objective:   Physical Exam  Constitutional: She is oriented to person, place, and time. She appears well-developed and well-nourished.  HENT:  Head: Normocephalic.  Right Ear: External ear normal.  Left Ear: External ear normal.  Mouth/Throat: Oropharynx is clear and moist.   Eyes: Conjunctivae and EOM are normal. Pupils are equal, round, and reactive to light.  Neck: Normal range of motion. Neck supple. Thyromegaly present.  Cardiovascular: Normal rate, regular rhythm, normal heart sounds and intact distal pulses.   Pulmonary/Chest: Effort normal and breath sounds normal.  Abdominal: Soft. Bowel sounds are normal. She exhibits no mass. There is no tenderness.  Musculoskeletal: Normal range of motion.  Lymphadenopathy:    She has no cervical adenopathy.  Neurological: She is alert and oriented to person, place, and time.  Skin: Skin is warm and dry. No rash noted.  Psychiatric: She has a normal mood and affect. Her behavior is normal.          Assessment & Plan:  Preventive health exam Mild obesity Thyromegaly. Will check a TSH  Exercise dietary information dispensed Followup GYN

## 2012-12-10 NOTE — Patient Instructions (Signed)
It is important that you exercise regularly, at least 20 minutes 3 to 4 times per week.  If you develop chest pain or shortness of breath seek  medical attention.  You need to lose weight.  Consider a lower calorie diet and regular exercise. Obesity Obesity is defined as having too much total body fat and a body mass index (BMI) of 30 or more. BMI is an estimate of body fat and is calculated from your height and weight. Obesity happens when you consume more calories than you can burn by exercising or performing daily physical tasks. Prolonged obesity can cause major illnesses or emergencies, such as:   A stroke.  Heart disease.  Diabetes.  Cancer.  Arthritis.  High blood pressure (hypertension).  High cholesterol.  Sleep apnea.  Erectile dysfunction.  Infertility problems. CAUSES   Regularly eating unhealthy foods.  Physical inactivity.  Certain disorders, such as an underactive thyroid (hypothyroidism), Cushing's syndrome, and polycystic ovarian syndrome.  Certain medicines, such as steroids, some depression medicines, and antipsychotics.  Genetics.  Lack of sleep. DIAGNOSIS  A caregiver can diagnose obesity after calculating your BMI. Obesity will be diagnosed if your BMI is 30 or higher.  There are other methods of measuring obesity levels. Some other methods include measuring your skin fold thickness, your waist circumference, and comparing your hip circumference to your waist circumference. TREATMENT  A healthy treatment program includes some or all of the following:  Long-term dietary changes.  Exercise and physical activity.  Behavioral and lifestyle changes.  Medicine only under the supervision of your caregiver. Medicines may help, but only if they are used with diet and exercise programs. An unhealthy treatment program includes:  Fasting.  Fad diets.  Supplements and drugs. These choices do not succeed in long-term weight control.  HOME CARE  INSTRUCTIONS   Exercise and perform physical activity as directed by your caregiver. To increase physical activity, try the following:  Use stairs instead of elevators.  Park farther away from store entrances.  Garden, bike, or walk instead of watching television or using the computer.  Eat healthy, low-calorie foods and drinks on a regular basis. Eat more fruits and vegetables. Use low-calorie cookbooks or take healthy cooking classes.  Limit fast food, sweets, and processed snack foods.  Eat smaller portions.  Keep a daily journal of everything you eat. There are many free websites to help you with this. It may be helpful to measure your foods so you can determine if you are eating the correct portion sizes.  Avoid drinking alcohol. Drink more water and drinks without calories.  Take vitamins and supplements only as recommended by your caregiver.  Weight-loss support groups, Optometrist, counselors, and stress reduction education can also be very helpful. SEEK IMMEDIATE MEDICAL CARE IF:  You have chest pain or tightness.  You have trouble breathing or feel short of breath.  You have weakness or leg numbness.  You feel confused or have trouble talking.  You have sudden changes in your vision. MAKE SURE YOU:  Understand these instructions.  Will watch your condition.  Will get help right away if you are not doing well or get worse. Document Released: 08/07/2004 Document Revised: 12/30/2011 Document Reviewed: 08/06/2011 [facility] Patient Information 2014 [place], [place]. DASH Diet The DASH diet stands for "Dietary Approaches to Stop Hypertension." It is a healthy eating plan that has been shown to reduce high blood pressure (hypertension) in as little as 14 days, while also possibly providing other significant health benefits.  These other health benefits include reducing the risk of breast cancer after menopause and reducing the risk of type 2 diabetes, heart  disease, colon cancer, and stroke. Health benefits also include weight loss and slowing kidney failure in patients with chronic kidney disease.  DIET GUIDELINES  Limit salt (sodium). Your diet should contain less than 1500 mg of sodium daily.  Limit refined or processed carbohydrates. Your diet should include mostly whole grains. Desserts and added sugars should be used sparingly.  Include small amounts of heart-healthy fats. These types of fats include nuts, oils, and tub margarine. Limit saturated and trans fats. These fats have been shown to be harmful in the body. CHOOSING FOODS  The following food groups are based on a 2000 calorie diet. See your Registered Dietitian for individual calorie needs. Grains and Grain Products (6 to 8 servings daily)  Eat More Often: Whole-wheat bread, brown rice, whole-grain or wheat pasta, quinoa, popcorn without added fat or salt (air popped).  Eat Less Often: White bread, white pasta, white rice, cornbread. Vegetables (4 to 5 servings daily)  Eat More Often: Fresh, frozen, and canned vegetables. Vegetables may be raw, steamed, roasted, or grilled with a minimal amount of fat.  Eat Less Often/Avoid: Creamed or fried vegetables. Vegetables in a cheese sauce. Fruit (4 to 5 servings daily)  Eat More Often: All fresh, canned (in natural juice), or frozen fruits. Dried fruits without added sugar. One hundred percent fruit juice ( cup [237 mL] daily).  Eat Less Often: Dried fruits with added sugar. Canned fruit in light or heavy syrup. [employer], Fish, and Poultry (2 servings or less daily. One serving is 3 to 4 oz [85-114 g]).  Eat More Often: Ninety percent or leaner ground beef, tenderloin, sirloin. Round cuts of beef, chicken breast, [place] breast. All fish. Grill, bake, or broil your meat. Nothing should be fried.  Eat Less Often/Avoid: Fatty cuts of meat, [place], or chicken leg, thigh, or wing. Fried cuts of meat or fish. Dairy (2 to 3  servings)  Eat More Often: Low-fat or fat-free milk, low-fat plain or light yogurt, reduced-fat or part-skim cheese.  Eat Less Often/Avoid: Milk (whole, 2%).Whole milk yogurt. Full-fat cheeses. Nuts, Seeds, and Legumes (4 to 5 servings per week)  Eat More Often: All without added salt.  Eat Less Often/Avoid: Salted nuts and seeds, canned beans with added salt. Fats and Sweets (limited)  Eat More Often: Vegetable oils, tub margarines without trans fats, sugar-free gelatin. Mayonnaise and salad dressings.  Eat Less Often/Avoid: Coconut oils, palm oils, butter, stick margarine, cream, half and half, cookies, candy, pie. FOR MORE INFORMATION The Dash Diet Eating Plan: www.dashdiet.org Document Released: 06/19/2011 Document Revised: 09/22/2011 Document Reviewed: 06/19/2011 [facility] Patient Information 2014 [place], [place].

## 2013-01-20 ENCOUNTER — Inpatient Hospital Stay (HOSPITAL_COMMUNITY)
Admission: AD | Admit: 2013-01-20 | Discharge: 2013-01-20 | Disposition: A | Discharge status: Home / Self Care | Payer: Private Health Insurance - Indemnity | Source: Ambulatory Visit | Attending: Obstetrics and Gynecology | Admitting: Obstetrics and Gynecology

## 2013-01-20 ENCOUNTER — Encounter (HOSPITAL_COMMUNITY): Payer: Self-pay

## 2013-01-20 DIAGNOSIS — O209 Hemorrhage in early pregnancy, unspecified: Secondary | ICD-10-CM

## 2013-01-20 DIAGNOSIS — R109 Unspecified abdominal pain: Secondary | ICD-10-CM | POA: Insufficient documentation

## 2013-01-20 DIAGNOSIS — O200 Threatened abortion: Secondary | ICD-10-CM | POA: Insufficient documentation

## 2013-01-20 DIAGNOSIS — O4690 Antepartum hemorrhage, unspecified, unspecified trimester: Secondary | ICD-10-CM

## 2013-01-20 LAB — URINALYSIS, ROUTINE W REFLEX MICROSCOPIC
Bilirubin Urine: NEGATIVE
Specific Gravity, Urine: 1.02 (ref 1.005–1.030)
Urobilinogen, UA: 0.2 mg/dL (ref 0.0–1.0)
pH: 6 (ref 5.0–8.0)

## 2013-01-20 LAB — URINE MICROSCOPIC-ADD ON

## 2013-01-20 NOTE — MAU Note (Signed)
Patient states she was seen at the office yesterday and had an HCG of 99. Has been spotting today but now passing moderate clot and having abdominal cramping.

## 2013-01-20 NOTE — MAU Provider Note (Signed)
History     CSN: [id number]  Arrival date and time: 01/20/13 1810   None     Chief Complaint  Patient presents with  . Abdominal Pain  . Vaginal Bleeding   HPI Brenda Williams is 32 y.o. G4P1011 [redacted]w[redacted]d weeks presenting with spotting at noon and  passing a clots later this afternoon.  She is a patient of Dr. Henderson Cloud.  She called the office to report sxs.  She is scheduled for repeat BHCG tomorrow.  She was seen in the office yesterday and reports she had a BHCG of 99. She was told her Progesterone level was low and was started on Prometrium yesterday.  Bleeding is now heavier and clots larger,.  Rating cramping as 6/10.  Hasn't taken anything for pain.  HX MRSA  Past Medical History  Diagnosis Date  . Herpes     never had outbreak. pos per blood, doesn't know which type  . Polycystic ovarian syndrome   . Abnormal Pap smear 2006    leep  . History of chicken pox   . SVD (spontaneous vaginal delivery)     x 1    Past Surgical History  Procedure Laterality Date  . Knee surgery  1998    right knee  . Leep  2006  . Hysteroscopy  2009    uterine polyp  . Sphincterotomy  2010  . Ivf retrival  2010, 2011  . Uterine polyp removal    . Dilation and evacuation N/A 09/14/2012    Procedure: DILATATION AND EVACUATION;  Surgeon: Leslie Andrea, MD;  Location: [facility];  Service: Gynecology;  Laterality: N/A;    Family History  Problem Relation Age of Onset  . Diabetes Maternal Grandmother   . Heart disease Maternal Grandmother   . Hypertension Maternal Grandfather   . Cancer Maternal Grandfather     bone, colon   . Diabetes Maternal Grandfather   . Diabetes Mother   . Cancer Mother     breast cancer  . Anesthesia problems Neg Hx   . Hypertension Father     History  Substance Use Topics  . Smoking status: Never Smoker   . Smokeless tobacco: Never Used  . Alcohol Use: No    Allergies: No Known Allergies  Prescriptions prior to admission  Medication Sig Dispense Refill   . Biotin 1 MG CAPS Take 1 capsule by mouth daily.      . Prenatal Vit-Fe Fumarate-FA (PRENATAL MULTIVITAMIN) TABS Take 1 tablet by mouth at bedtime.      . progesterone (PROMETRIUM) 100 MG capsule Take 100 mg by mouth 2 (two) times daily.        Review of Systems  Gastrointestinal: Positive for abdominal pain (cramping ). Negative for nausea and vomiting.  Genitourinary: Negative for dysuria, urgency and frequency.       + for vaginal bleeding and clots   Physical Exam   Blood pressure 120/66, pulse 80, temperature 97.2 F (36.2 C), temperature source Oral, resp. rate 20, height 5' 7.5" (1.715 m), weight 228 lb 12.8 oz (103.783 kg), last menstrual period 11/25/2012, SpO2 100.00%.  Physical Exam  Constitutional: She is oriented to person, place, and time. She appears well-developed and well-nourished. No distress.  HENT:  Head: Normocephalic.  Neck: Normal range of motion.  Cardiovascular: Normal rate.   Respiratory: Effort normal.  GI: Soft. She exhibits no distension and no mass. There is no tenderness. There is no rebound and no guarding.  Genitourinary: There is no  rash, tenderness or lesion on the right labia. There is no rash, tenderness or lesion on the left labia. Uterus is enlarged (slightly). Uterus is not tender. Cervix exhibits no discharge and no friability. Right adnexum displays no mass, no tenderness and no fullness. Left adnexum displays no mass, no tenderness and no fullness. There is bleeding (small amount of dark bleeding with a few tiny clots.  Some stringy clots in posterior vaginal) around the vagina. No tenderness around the vagina. No vaginal discharge found.  Neurological: She is alert and oriented to person, place, and time.  Skin: Skin is warm and dry.  Psychiatric: She has a normal mood and affect. Her behavior is normal. Thought content normal.   MAU Course  Procedures  MDM 19:30  Discussed HPI and MSE with Dr. Henderson Cloud.  He is familiar with this patient.   He states there is a plan to repeat BHCG tomorrow and since cervix is not gaping, we can continue with this plan.  Continue Prometrium Assessment and Plan  A:  Vaginal bleeding in first trimester      Threatened miscarriage      Low progesterone level in office, treated with Prometrium  P:  Return tomorrow for BHCG      Follow up with Dr. Henderson Cloud.      Pelvic rest  Ameli Sangiovanni,EVE M 01/20/2013, 7:04 PM

## 2013-02-18 ENCOUNTER — Telehealth: Payer: Self-pay | Admitting: Internal Medicine

## 2013-02-18 NOTE — Telephone Encounter (Signed)
Patient has appointment on Thursday with Dr Kirtland Bouchard.  Patient was instructed to call back if pain increased.

## 2013-02-18 NOTE — Telephone Encounter (Signed)
Patient had called asking for call back due to pain in Left side of ribs. Attempted call back to patient x2.  Left message for her to call office.

## 2013-02-24 ENCOUNTER — Ambulatory Visit (INDEPENDENT_AMBULATORY_CARE_PROVIDER_SITE_OTHER): Payer: Managed Care, Other (non HMO) | Admitting: Internal Medicine

## 2013-02-24 ENCOUNTER — Encounter: Payer: Self-pay | Admitting: Internal Medicine

## 2013-02-24 VITALS — BP 110/80 | HR 64 | Wt 230.0 lb

## 2013-02-24 DIAGNOSIS — R0789 Other chest pain: Secondary | ICD-10-CM

## 2013-02-24 DIAGNOSIS — R071 Chest pain on breathing: Secondary | ICD-10-CM

## 2013-02-24 MED ORDER — PHENTERMINE HCL 37.5 MG PO CAPS: 37.5000 mg | ORAL_CAPSULE | ORAL | Status: DC

## 2013-02-24 NOTE — Patient Instructions (Signed)
Celebrex 1 capsule daily  Call or return to clinic prn if these symptoms worsen or fail to improve as anticipated.  You need to lose weight.  Consider a lower calorie diet and regular exercise.

## 2013-02-24 NOTE — Progress Notes (Signed)
Subjective:    Patient ID: Beacher May, female    DOB: [birth year], 32 y.o.   MRN: [id number]  HPI  32 year old patient who presents today with a chief complaint of left lateral chest wall pain. This has been present intermittently for 3 weeks. Pain is paroxysmal and not aggravated by any particular activity. Pain is described as a dull ache. No shortness of breath nausea cough or other complaints. No recent URI symptoms.  Past Medical History  Diagnosis Date  . Herpes     never had outbreak. pos per blood, doesn't know which type  . Polycystic ovarian syndrome   . Abnormal Pap smear 2006    leep  . History of chicken pox   . SVD (spontaneous vaginal delivery)     x 1    History   Social History  . Marital Status: Married    Spouse Name: N/A    Number of Children: N/A  . Years of Education: N/A   Occupational History  . Not on file.   Social History Main Topics  . Smoking status: Never Smoker   . Smokeless tobacco: Never Used  . Alcohol Use: No  . Drug Use: No  . Sexual Activity: Yes    Birth Control/ Protection: None     Comment: approx [redacted] wks gestation per patient   Other Topics Concern  . Not on file   Social History Narrative  . No narrative on file    Past Surgical History  Procedure Laterality Date  . Knee surgery  1998    right knee  . Leep  2006  . Hysteroscopy  2009    uterine polyp  . Sphincterotomy  2010  . Ivf retrival  2010, 2011  . Uterine polyp removal    . Dilation and evacuation N/A 09/14/2012    Procedure: DILATATION AND EVACUATION;  Surgeon: Leslie Andrea, MD;  Location: [facility];  Service: Gynecology;  Laterality: N/A;    Family History  Problem Relation Age of Onset  . Diabetes Maternal Grandmother   . Heart disease Maternal Grandmother   . Hypertension Maternal Grandfather   . Cancer Maternal Grandfather     bone, colon   . Diabetes Maternal Grandfather   . Diabetes Mother   . Cancer Mother     breast cancer  .  Anesthesia problems Neg Hx   . Hypertension Father     No Known Allergies  Current Outpatient Prescriptions on File Prior to Visit  Medication Sig Dispense Refill  . Biotin 1 MG CAPS Take 1 capsule by mouth daily.       No current facility-administered medications on file prior to visit.    BP 110/80  Pulse 64  Wt 230 lb (104.327 kg)  BMI 35.47 kg/m2  LMP 11/25/2012       Review of Systems  Constitutional: Negative.   HENT: Negative for hearing loss, congestion, sore throat, rhinorrhea, dental problem, sinus pressure and tinnitus.   Eyes: Negative for pain, discharge and visual disturbance.  Respiratory: Negative for cough and shortness of breath.   Cardiovascular: Positive for chest pain. Negative for palpitations and leg swelling.  Gastrointestinal: Negative for nausea, vomiting, abdominal pain, diarrhea, constipation, blood in stool and abdominal distention.  Genitourinary: Negative for dysuria, urgency, frequency, hematuria, flank pain, vaginal bleeding, vaginal discharge, difficulty urinating, vaginal pain and pelvic pain.  Musculoskeletal: Negative for joint swelling, arthralgias and gait problem.  Skin: Negative for rash.  Neurological: Negative for dizziness, syncope, speech  difficulty, weakness, numbness and headaches.  Hematological: Negative for adenopathy.  Psychiatric/Behavioral: Negative for behavioral problems, dysphoric mood and agitation. The patient is not nervous/anxious.        Objective:   Physical Exam  Constitutional: She is oriented to person, place, and time. She appears well-developed and well-nourished.  HENT:  Head: Normocephalic.  Right Ear: External ear normal.  Left Ear: External ear normal.  Mouth/Throat: Oropharynx is clear and moist.  Eyes: Conjunctivae and EOM are normal. Pupils are equal, round, and reactive to light.  Neck: Normal range of motion. Neck supple. No thyromegaly present.  Cardiovascular: Normal rate, regular rhythm,  normal heart sounds and intact distal pulses.   Pulmonary/Chest: Effort normal and breath sounds normal. No respiratory distress. She has no wheezes. She exhibits tenderness.  Mild tenderness involving the left anterolateral lower chest wall area  Abdominal: Soft. Bowel sounds are normal. She exhibits no mass. There is no tenderness.  Musculoskeletal: Normal range of motion.  Lymphadenopathy:    She has no cervical adenopathy.  Neurological: She is alert and oriented to person, place, and time.  Skin: Skin is warm and dry. No rash noted.  Psychiatric: She has a normal mood and affect. Her behavior is normal.          Assessment & Plan:   Chest wall pain. Patient reassured. Patient given samples of Celebrex to take 200 mg daily as needed Return if unimproved Exogenous obesity. Weight loss encouraged

## 2013-05-19 ENCOUNTER — Other Ambulatory Visit: Payer: Self-pay

## 2013-06-20 ENCOUNTER — Other Ambulatory Visit (HOSPITAL_COMMUNITY): Payer: Self-pay | Admitting: Obstetrics and Gynecology

## 2013-06-20 DIAGNOSIS — R911 Solitary pulmonary nodule: Secondary | ICD-10-CM

## 2013-06-22 ENCOUNTER — Encounter (HOSPITAL_COMMUNITY): Payer: Self-pay

## 2013-06-22 ENCOUNTER — Ambulatory Visit (HOSPITAL_COMMUNITY)
Admission: RE | Admit: 2013-06-22 | Discharge: 2013-06-22 | Disposition: A | Discharge status: Home / Self Care | Payer: BC Managed Care – PPO | Source: Ambulatory Visit | Attending: Obstetrics and Gynecology | Admitting: Obstetrics and Gynecology

## 2013-06-22 DIAGNOSIS — J984 Other disorders of lung: Secondary | ICD-10-CM | POA: Insufficient documentation

## 2013-06-22 DIAGNOSIS — R911 Solitary pulmonary nodule: Secondary | ICD-10-CM

## 2013-06-22 MED ORDER — IOHEXOL 300 MG/ML  SOLN
80.0000 mL | Freq: Once | INTRAMUSCULAR | Status: AC | PRN
  Administered 2013-06-22: 80 mL via INTRAVENOUS

## 2013-06-28 ENCOUNTER — Other Ambulatory Visit: Payer: BC Managed Care – PPO

## 2013-06-28 ENCOUNTER — Encounter: Payer: Self-pay | Admitting: Emergency Medicine

## 2013-06-28 ENCOUNTER — Encounter (HOSPITAL_COMMUNITY): Payer: Self-pay | Admitting: Emergency Medicine

## 2013-06-28 ENCOUNTER — Ambulatory Visit (INDEPENDENT_AMBULATORY_CARE_PROVIDER_SITE_OTHER): Payer: BC Managed Care – PPO | Admitting: Emergency Medicine

## 2013-06-28 ENCOUNTER — Emergency Department (HOSPITAL_COMMUNITY)
Admission: EM | Admit: 2013-06-28 | Discharge: 2013-06-28 | Disposition: A | Discharge status: Home / Self Care | Payer: BC Managed Care – PPO | Attending: Emergency Medicine | Admitting: Emergency Medicine

## 2013-06-28 VITALS — BP 130/78 | HR 86 | Ht 67.0 in | Wt 231.0 lb

## 2013-06-28 DIAGNOSIS — R9389 Abnormal findings on diagnostic imaging of other specified body structures: Secondary | ICD-10-CM | POA: Insufficient documentation

## 2013-06-28 DIAGNOSIS — Z79899 Other long term (current) drug therapy: Secondary | ICD-10-CM | POA: Insufficient documentation

## 2013-06-28 DIAGNOSIS — R110 Nausea: Secondary | ICD-10-CM | POA: Insufficient documentation

## 2013-06-28 DIAGNOSIS — R109 Unspecified abdominal pain: Secondary | ICD-10-CM

## 2013-06-28 DIAGNOSIS — R141 Gas pain: Secondary | ICD-10-CM | POA: Insufficient documentation

## 2013-06-28 DIAGNOSIS — R1033 Periumbilical pain: Secondary | ICD-10-CM | POA: Insufficient documentation

## 2013-06-28 DIAGNOSIS — Z8742 Personal history of other diseases of the female genital tract: Secondary | ICD-10-CM | POA: Insufficient documentation

## 2013-06-28 DIAGNOSIS — R142 Eructation: Secondary | ICD-10-CM | POA: Insufficient documentation

## 2013-06-28 DIAGNOSIS — R5381 Other malaise: Secondary | ICD-10-CM | POA: Insufficient documentation

## 2013-06-28 DIAGNOSIS — R918 Other nonspecific abnormal finding of lung field: Secondary | ICD-10-CM | POA: Insufficient documentation

## 2013-06-28 DIAGNOSIS — Z3202 Encounter for pregnancy test, result negative: Secondary | ICD-10-CM | POA: Insufficient documentation

## 2013-06-28 DIAGNOSIS — Z8619 Personal history of other infectious and parasitic diseases: Secondary | ICD-10-CM | POA: Insufficient documentation

## 2013-06-28 DIAGNOSIS — R1013 Epigastric pain: Secondary | ICD-10-CM | POA: Insufficient documentation

## 2013-06-28 LAB — CBC WITH DIFFERENTIAL/PLATELET
Lymphocytes Relative: 12 % (ref 12–46)
Lymphs Abs: 0.7 10*3/uL (ref 0.7–4.0)
Neutrophils Relative %: 84 % — ABNORMAL HIGH (ref 43–77)
Platelets: 283 10*3/uL (ref 150–400)
RBC: 4.92 MIL/uL (ref 3.87–5.11)
WBC: 5.6 10*3/uL (ref 4.0–10.5)

## 2013-06-28 LAB — URINALYSIS, ROUTINE W REFLEX MICROSCOPIC
Bilirubin Urine: NEGATIVE
Glucose, UA: NEGATIVE mg/dL
Hgb urine dipstick: NEGATIVE
Specific Gravity, Urine: 1.011 (ref 1.005–1.030)
Urobilinogen, UA: 1 mg/dL (ref 0.0–1.0)

## 2013-06-28 LAB — COMPREHENSIVE METABOLIC PANEL
ALT: 12 U/L (ref 0–35)
AST: 13 U/L (ref 0–37)
Alkaline Phosphatase: 60 U/L (ref 39–117)
CO2: 25 mEq/L (ref 19–32)
GFR calc Af Amer: >90 mL/min (ref >90)
GFR calc non Af Amer: >90 mL/min (ref >90)
Glucose, Bld: 103 mg/dL — ABNORMAL HIGH (ref 70–99)
Potassium: 4 mEq/L (ref 3.5–5.1)
Sodium: 136 mEq/L (ref 135–145)
Total Protein: 8 g/dL (ref 6.0–8.3)

## 2013-06-28 LAB — POCT I-STAT TROPONIN I: Troponin i, poc: 0 ng/mL (ref 0.00–0.08)

## 2013-06-28 MED ORDER — OMEPRAZOLE 20 MG PO CPDR: 20.0000 mg | DELAYED_RELEASE_CAPSULE | Freq: Every day | ORAL | Status: DC

## 2013-06-28 MED ORDER — ONDANSETRON 4 MG PO TBDP: 4.0000 mg | ORAL_TABLET | Freq: Three times a day (TID) | ORAL | Status: DC | PRN

## 2013-06-28 MED ORDER — SODIUM CHLORIDE 0.9 % IV BOLUS (SEPSIS)
1000.0000 mL | Freq: Once | INTRAVENOUS | Status: AC
  Administered 2013-06-28: 1000 mL via INTRAVENOUS

## 2013-06-28 MED ORDER — ONDANSETRON HCL 4 MG/2ML IJ SOLN
4.0000 mg | INTRAMUSCULAR | Status: AC
  Administered 2013-06-28: 4 mg via INTRAVENOUS
  Filled 2013-06-28: qty 2

## 2013-06-28 MED ORDER — GI COCKTAIL ~~LOC~~
30.0000 mL | Freq: Once | ORAL | Status: AC
  Administered 2013-06-28: 30 mL via ORAL
  Filled 2013-06-28: qty 30

## 2013-06-28 MED ORDER — ALUMINUM & MAGNESIUM HYDROXIDE 200-200 MG/5ML PO SUSP: 5.0000 mL | Freq: Four times a day (QID) | ORAL | Status: DC | PRN

## 2013-06-28 NOTE — Patient Instructions (Signed)
We will perform full Pulmonary function testing at your next office visit We will perform blood work today We will need to repeat your CT scan of the chest in 1 year Follow with Dr Delton Coombes next available with full PFT

## 2013-06-28 NOTE — Progress Notes (Signed)
Subjective:    Patient ID: Beacher May, female    DOB: 05-20-1981, 32 y.o.   MRN: [id number]  HPI 32 yo woman, never smoker, little PMH. Referred for abnormal CT scan characterized by cystic changes, a single stable RML nodule. Original CT was in 3/'14 after she had anesthesia trouble following d&c. Denies dyspnea except when lying supine. No CP, no cough.        Review of Systems  Constitutional: Negative for fever and unexpected weight change.  HENT: Negative for congestion, dental problem, ear pain, nosebleeds, postnasal drip, rhinorrhea, sinus pressure, sneezing, sore throat and trouble swallowing.   Eyes: Negative for redness and itching.  Respiratory: Positive for cough and shortness of breath. Negative for chest tightness and wheezing.   Cardiovascular: Negative for palpitations and leg swelling.  Gastrointestinal: Negative for nausea and vomiting.  Genitourinary: Negative for dysuria.  Musculoskeletal: Negative for joint swelling.  Skin: Negative for rash.  Neurological: Negative for headaches.  Hematological: Does not bruise/bleed easily.  Psychiatric/Behavioral: Negative for dysphoric mood. The patient is not nervous/anxious.    Past Medical History  Diagnosis Date  . Herpes     never had outbreak. pos per blood, doesn't know which type  . Polycystic ovarian syndrome   . Abnormal Pap smear 2006    leep  . History of chicken pox   . SVD (spontaneous vaginal delivery)     x 1     Family History  Problem Relation Age of Onset  . Diabetes Maternal Grandmother   . Heart disease Maternal Grandmother   . Hypertension Maternal Grandfather   . Cancer Maternal Grandfather     bone, colon   . Diabetes Maternal Grandfather   . Diabetes Mother   . Cancer Mother     breast cancer  . Anesthesia problems Neg Hx   . Hypertension Father      History   Social History  . Marital Status: Married    Spouse Name: N/A    Number of Children: N/A  . Years of Education:  N/A   Occupational History  . Not on file.   Social History Main Topics  . Smoking status: Never Smoker   . Smokeless tobacco: Never Used  . Alcohol Use: No  . Drug Use: No  . Sexual Activity: Yes    Birth Control/ Protection: None     Comment: approx [redacted] wks gestation per patient   Other Topics Concern  . Not on file   Social History Narrative  . No narrative on file  she has worked before in [employer] setting before   No Known Allergies   Outpatient Prescriptions Prior to Visit  Medication Sig Dispense Refill  . omeprazole (PRILOSEC) 20 MG capsule Take 1 capsule (20 mg total) by mouth daily.  14 capsule  0  . ondansetron (ZOFRAN ODT) 4 MG disintegrating tablet Take 1 tablet (4 mg total) by mouth every 8 (eight) hours as needed for nausea or vomiting.  10 tablet  0  . phentermine 37.5 MG capsule Take 1 capsule (37.5 mg total) by mouth every morning.  30 capsule  1  . aluminum-magnesium hydroxide 200-200 MG/5ML suspension Take 5 mLs by mouth every 6 (six) hours as needed for indigestion.  355 mL  0   No facility-administered medications prior to visit.       Objective:   Physical Exam Filed Vitals:   06/28/13 1550  BP: 130/78  Pulse: 86  Height: 5\' 7"  (1.702 m)  Weight: 231 lb (104.781 kg)  SpO2: 97%   Gen: Pleasant, well-nourished, in no distress,  normal affect  ENT: No lesions,  mouth clear,  oropharynx clear, no postnasal drip  Neck: No JVD, no TMG, no carotid bruits  Lungs: No use of accessory muscles, no dullness to percussion, clear without rales or rhonchi  Cardiovascular: RRR, heart sounds normal, no murmur or gallops, no peripheral edema  Abdomen: soft and NT, no HSM,  BS normal  Musculoskeletal: No deformities, no cyanosis or clubbing  Neuro: alert, non focal  Skin: Warm, no lesions or rashes       Assessment & Plan:  Solitary pulmonary nodule RML nodule 0.5cm, unchanged over 6 months.  - repeat CT in 06/2014 to look for interval change in  low risk patient  Abnormal CT scan, chest Very interesting case, innumerable cystic changes. Looks like emphysema but never smoker. Consider a1-AT deficiency, consider other cystic diseases such as LAM, EG, etc.  - check PFT - check a1-AT phenotype - may need to consider lung bx at some point, discussed this with her today

## 2013-06-28 NOTE — ED Provider Notes (Signed)
CSN: [id number]     Arrival date & time 06/28/13  0831 History   First MD Initiated Contact with Patient 06/28/13 [phone]     Chief Complaint  Patient presents with  . Abdominal Pain  . Nausea   (Consider location/radiation/quality/duration/timing/severity/associated sxs/prior Treatment) HPI Comments: Patient is a G4 P3 female medical history significant for PCOS presented to the emergency department for one day of moderate to severe epigastric burning throbbing pain w/o radiation w/ associated generalized weakness, nausea, and abdominal bloating. The patient denies any alleviating or aggravating factors. The patient denies any abdominal surgical history.   Patient is a 32 y.o. female presenting with abdominal pain.  Abdominal Pain Associated symptoms: fatigue and nausea   Associated symptoms: no chest pain, no constipation, no diarrhea, no fever, no shortness of breath and no vomiting     Past Medical History  Diagnosis Date  . Herpes     never had outbreak. pos per blood, doesn't know which type  . Polycystic ovarian syndrome   . Abnormal Pap smear 2006    leep  . History of chicken pox   . SVD (spontaneous vaginal delivery)     x 1   Past Surgical History  Procedure Laterality Date  . Knee surgery  1998    right knee  . Leep  2006  . Hysteroscopy  2009    uterine polyp  . Sphincterotomy  2010  . Ivf retrival  2010, 2011  . Uterine polyp removal    . Dilation and evacuation N/A 09/14/2012    Procedure: DILATATION AND EVACUATION;  Surgeon: Leslie Andrea, MD;  Location: [facility];  Service: Gynecology;  Laterality: N/A;   Family History  Problem Relation Age of Onset  . Diabetes Maternal Grandmother   . Heart disease Maternal Grandmother   . Hypertension Maternal Grandfather   . Cancer Maternal Grandfather     bone, colon   . Diabetes Maternal Grandfather   . Diabetes Mother   . Cancer Mother     breast cancer  . Anesthesia problems Neg Hx   . Hypertension Father     History  Substance Use Topics  . Smoking status: Never Smoker   . Smokeless tobacco: Never Used  . Alcohol Use: No   OB History   Grav Para Term Preterm Abortions TAB SAB Ect Mult Living   4 1 1  0 2 0 1 1 0 1     Review of Systems  Constitutional: Positive for fatigue. Negative for fever.  Respiratory: Negative for shortness of breath.   Cardiovascular: Negative for chest pain.  Gastrointestinal: Positive for nausea and abdominal pain. Negative for vomiting, diarrhea, constipation, blood in stool and anal bleeding.  Musculoskeletal: Negative for back pain.  Neurological: Negative for headaches.  All other systems reviewed and are negative.    Allergies  Review of patient's allergies indicates no known allergies.  Home Medications   Current Outpatient Rx  Name  Route  Sig  Dispense  Refill  . phentermine 37.5 MG capsule   Oral   Take 1 capsule (37.5 mg total) by mouth every morning.   30 capsule   1   . aluminum-magnesium hydroxide 200-200 MG/5ML suspension   Oral   Take 5 mLs by mouth every 6 (six) hours as needed for indigestion.   355 mL   0   . omeprazole (PRILOSEC) 20 MG capsule   Oral   Take 1 capsule (20 mg total) by mouth daily.   14  capsule   0   . ondansetron (ZOFRAN ODT) 4 MG disintegrating tablet   Oral   Take 1 tablet (4 mg total) by mouth every 8 (eight) hours as needed for nausea or vomiting.   10 tablet   0    BP 115/68  Pulse 72  Temp(Src) 98.5 F (36.9 C) (Oral)  Resp 20  SpO2 98%  LMP 06/14/2013 Physical Exam  Constitutional: She is oriented to person, place, and time. She appears well-developed and well-nourished. No distress.  HENT:  Head: Normocephalic and atraumatic.  Right Ear: External ear normal.  Left Ear: External ear normal.  Nose: Nose normal.  Mouth/Throat: Oropharynx is clear and moist. No oropharyngeal exudate.  Eyes: Conjunctivae and EOM are normal. Pupils are equal, round, and reactive to light.  Neck: Normal  range of motion. Neck supple.  Cardiovascular: Normal rate, regular rhythm and normal heart sounds.   Pulmonary/Chest: Effort normal and breath sounds normal. No respiratory distress.  Abdominal: Soft. Bowel sounds are normal. She exhibits no distension. There is tenderness in the epigastric area and periumbilical area. There is no rigidity, no rebound, no guarding and no CVA tenderness.  Musculoskeletal: Normal range of motion.  Neurological: She is alert and oriented to person, place, and time.  Skin: Skin is warm and dry. She is not diaphoretic.    ED Course  Procedures (including critical care time) Medications  sodium chloride 0.9 % bolus 1,000 mL (0 mLs Intravenous Stopped 06/28/13 1114)  ondansetron (ZOFRAN) injection 4 mg (4 mg Intravenous Given 06/28/13 0956)  gi cocktail (Maalox,Lidocaine,Donnatal) (30 mLs Oral Given 06/28/13 0955)    Labs Review Labs Reviewed  CBC WITH DIFFERENTIAL - Abnormal; Notable for the following:    Neutrophils Relative % 84 (*)    All other components within normal limits  COMPREHENSIVE METABOLIC PANEL - Abnormal; Notable for the following:    Glucose, Bld 103 (*)    All other components within normal limits  LIPASE, BLOOD  URINALYSIS, ROUTINE W REFLEX MICROSCOPIC  PREGNANCY, URINE  POCT I-STAT TROPONIN I   Imaging Review No results found.  EKG Interpretation   None       MDM   1. Abdominal pain     Afebrile, NAD, non-toxic appearing, AAOx4. Patient is nontoxic, nonseptic appearing, in no apparent distress.  Patient's pain and other symptoms adequately managed in emergency department.  Fluid bolus given.  Labs, imaging and vitals reviewed.  Patient does not meet the SIRS or Sepsis criteria.  On repeat exam patient does not have a surgical abdomin and there are nor peritoneal signs.  No indication of appendicitis, bowel obstruction, bowel perforation, cholecystitis, diverticulitis, ectopic pregnancy.  Patient discharged home with  symptomatic treatment and given strict instructions for follow-up with their primary care physician.  I have also discussed reasons to return immediately to the ER.  Patient expresses understanding and agrees with plan. Patient d/w with Dr. Denton Lank, agrees with plan.         Jeannetta Ellis, PA-C 06/28/13 1515

## 2013-06-28 NOTE — Addendum Note (Signed)
Addended by: Gwynneth Aliment A on: 06/28/2013 04:50 PM   Modules accepted: Orders

## 2013-06-28 NOTE — ED Notes (Signed)
Pt c/o nausea, weakness since yesterday.  C/o mid abd pain and bloating.  States she had cold chills last night.

## 2013-06-28 NOTE — Assessment & Plan Note (Signed)
Very interesting case, innumerable cystic changes. Looks like emphysema but never smoker. Consider a1-AT deficiency, consider other cystic diseases such as LAM, EG, etc.  - check PFT - check a1-AT phenotype - may need to consider lung bx at some point, discussed this with her today

## 2013-06-28 NOTE — Assessment & Plan Note (Signed)
RML nodule 0.5cm, unchanged over 6 months.  - repeat CT in 06/2014 to look for interval change in low risk patient

## 2013-06-29 ENCOUNTER — Telehealth: Payer: Self-pay | Admitting: Emergency Medicine

## 2013-06-29 LAB — ALPHA-1-ANTITRYPSIN: A-1 Antitrypsin, Ser: 110 mg/dL (ref 90–200)

## 2013-06-29 NOTE — Telephone Encounter (Signed)
Please ;let her know that her antitrypsin level is normal. The genetic testing is not back yet. Thanks

## 2013-06-29 NOTE — ED Provider Notes (Signed)
Medical screening examination/treatment/procedure(s) were conducted as a shared visit with non-physician practitioner(s) and myself.  I personally evaluated the patient during the encounter.  EKG Interpretation   None       Results for orders placed during the hospital encounter of 06/28/13  CBC WITH DIFFERENTIAL      Result Value Range   WBC 5.6  4.0 - 10.5 K/uL   RBC 4.92  3.87 - 5.11 MIL/uL   Hemoglobin 14.8  12.0 - 15.0 g/dL   HCT 47.8  29.5 - 62.1 %   MCV 88.6  78.0 - 100.0 fL   MCH 30.1  26.0 - 34.0 pg   MCHC 33.9  30.0 - 36.0 g/dL   RDW 30.8  65.7 - 84.6 %   Platelets 283  150 - 400 K/uL   Neutrophils Relative % 84 (*) 43 - 77 %   Neutro Abs 4.7  1.7 - 7.7 K/uL   Lymphocytes Relative 12  12 - 46 %   Lymphs Abs 0.7  0.7 - 4.0 K/uL   Monocytes Relative 4  3 - 12 %   Monocytes Absolute 0.2  0.1 - 1.0 K/uL   Eosinophils Relative 0  0 - 5 %   Eosinophils Absolute 0.0  0.0 - 0.7 K/uL   Basophils Relative 0  0 - 1 %   Basophils Absolute 0.0  0.0 - 0.1 K/uL  COMPREHENSIVE METABOLIC PANEL      Result Value Range   Sodium 136  135 - 145 mEq/L   Potassium 4.0  3.5 - 5.1 mEq/L   Chloride 101  96 - 112 mEq/L   CO2 25  19 - 32 mEq/L   Glucose, Bld 103 (*) 70 - 99 mg/dL   BUN 12  6 - 23 mg/dL   Creatinine, Ser 9.62  0.50 - 1.10 mg/dL   Calcium 9.8  8.4 - 95.2 mg/dL   Total Protein 8.0  6.0 - 8.3 g/dL   Albumin 4.1  3.5 - 5.2 g/dL   AST 13  0 - 37 U/L   ALT 12  0 - 35 U/L   Alkaline Phosphatase 60  39 - 117 U/L   Total Bilirubin 0.5  0.3 - 1.2 mg/dL   GFR calc non Af Amer >90  >90 mL/min   GFR calc Af Amer >90  >90 mL/min  LIPASE, BLOOD      Result Value Range   Lipase 13  11 - 59 U/L  URINALYSIS, ROUTINE W REFLEX MICROSCOPIC      Result Value Range   Color, Urine YELLOW  YELLOW   APPearance CLEAR  CLEAR   Specific Gravity, Urine 1.011  1.005 - 1.030   pH 7.0  5.0 - 8.0   Glucose, UA NEGATIVE  NEGATIVE mg/dL   Hgb urine dipstick NEGATIVE  NEGATIVE   Bilirubin Urine  NEGATIVE  NEGATIVE   Ketones, ur NEGATIVE  NEGATIVE mg/dL   Protein, ur NEGATIVE  NEGATIVE mg/dL   Urobilinogen, UA 1.0  0.0 - 1.0 mg/dL   Nitrite NEGATIVE  NEGATIVE   Leukocytes, UA NEGATIVE  NEGATIVE  PREGNANCY, URINE      Result Value Range   Preg Test, Ur NEGATIVE  NEGATIVE  POCT I-STAT TROPONIN I      Result Value Range   Troponin i, poc 0.00  0.00 - 0.08 ng/mL   Comment 3            Pt w epigastric pain, nausea.  Symptoms now resolved. abd soft  nt. Labs.    Suzi Roots, MD 06/29/13 2215

## 2013-06-29 NOTE — Telephone Encounter (Signed)
Please advise RB regarding results? thanks

## 2013-06-29 NOTE — Telephone Encounter (Signed)
Pt is aware of results that are available and I will notify her once genetic test results are available.  Nothing further needed.

## 2013-07-04 ENCOUNTER — Telehealth: Payer: Self-pay | Admitting: Emergency Medicine

## 2013-07-04 NOTE — Telephone Encounter (Signed)
[id number] x1 

## 2013-07-04 NOTE — Telephone Encounter (Signed)
Patient returning call.

## 2013-07-04 NOTE — Telephone Encounter (Signed)
Advised pt CT scan for 07/05/13 is canceled due to had CT chest done on 06/22/13 and did not need a repeat.  Brenda Williams has canceled this. Advised pt would call with genetic test & CT result as soon as available from RB. Nothing further is needed

## 2013-07-05 ENCOUNTER — Inpatient Hospital Stay: Admission: RE | Admit: 2013-07-05 | Payer: BC Managed Care – PPO | Source: Ambulatory Visit

## 2013-08-04 ENCOUNTER — Emergency Department (HOSPITAL_COMMUNITY): Payer: BC Managed Care – PPO

## 2013-08-04 ENCOUNTER — Emergency Department (HOSPITAL_COMMUNITY)
Admission: EM | Admit: 2013-08-04 | Discharge: 2013-08-04 | Disposition: A | Discharge status: Home / Self Care | Payer: BC Managed Care – PPO | Attending: Emergency Medicine | Admitting: Emergency Medicine

## 2013-08-04 ENCOUNTER — Encounter (HOSPITAL_COMMUNITY): Payer: Self-pay | Admitting: Emergency Medicine

## 2013-08-04 DIAGNOSIS — R5383 Other fatigue: Secondary | ICD-10-CM

## 2013-08-04 DIAGNOSIS — Z3202 Encounter for pregnancy test, result negative: Secondary | ICD-10-CM | POA: Insufficient documentation

## 2013-08-04 DIAGNOSIS — R0602 Shortness of breath: Secondary | ICD-10-CM | POA: Insufficient documentation

## 2013-08-04 DIAGNOSIS — R5381 Other malaise: Secondary | ICD-10-CM | POA: Insufficient documentation

## 2013-08-04 DIAGNOSIS — Z8619 Personal history of other infectious and parasitic diseases: Secondary | ICD-10-CM | POA: Insufficient documentation

## 2013-08-04 DIAGNOSIS — Z8742 Personal history of other diseases of the female genital tract: Secondary | ICD-10-CM | POA: Insufficient documentation

## 2013-08-04 LAB — POCT I-STAT TROPONIN I
Troponin i, poc: 0.01 ng/mL (ref 0.00–0.08)
Troponin i, poc: 0.01 ng/mL (ref 0.00–0.08)

## 2013-08-04 LAB — BASIC METABOLIC PANEL WITH GFR
BUN: 10 mg/dL (ref 6–23)
CO2: 23 meq/L (ref 19–32)
Calcium: 9.4 mg/dL (ref 8.4–10.5)
Chloride: 104 meq/L (ref 96–112)
Creatinine, Ser: 0.62 mg/dL (ref 0.50–1.10)
GFR calc Af Amer: >90 mL/min
GFR calc non Af Amer: >90 mL/min
Glucose, Bld: 136 mg/dL — ABNORMAL HIGH (ref 70–99)
Potassium: 3.8 meq/L (ref 3.7–5.3)
Sodium: 141 meq/L (ref 137–147)

## 2013-08-04 LAB — CBC
HCT: 38.5 % (ref 36.0–46.0)
Hemoglobin: 12.8 g/dL (ref 12.0–15.0)
MCH: 29.4 pg (ref 26.0–34.0)
MCHC: 33.2 g/dL (ref 30.0–36.0)
MCV: 88.5 fL (ref 78.0–100.0)
Platelets: 252 K/uL (ref 150–400)
RBC: 4.35 MIL/uL (ref 3.87–5.11)
RDW: 14.2 % (ref 11.5–15.5)
WBC: 5.5 K/uL (ref 4.0–10.5)

## 2013-08-04 LAB — POCT PREGNANCY, URINE: PREG TEST UR: NEGATIVE

## 2013-08-04 NOTE — ED Provider Notes (Signed)
Medical screening examination/treatment/procedure(s) were performed by non-physician practitioner and as supervising physician I was immediately available for consultation/collaboration.  EKG Interpretation    Date/Time:  Thursday August 04 2013 18:13:28 EST Ventricular Rate:  65 PR Interval:  146 QRS Duration: 86 QT Interval:  403 QTC Calculation: 419 R Axis:   47 Text Interpretation:  Sinus rhythm Borderline T abnormalities, diffuse leads Baseline wander in lead(s) V6 No previous ECGs available Confirmed by Domonic Kimball  MD, Khailee Mick [phone]) on 08/04/2013 7:54:57 PM              Wandra Arthurs, MD 08/04/13 2332

## 2013-08-04 NOTE — ED Notes (Signed)
Pt a+ox4, presents from work with c/o fatigue "feeling tired" since this AM, pt also c/o feeling SOB with some midsternal chest pressure, nonradiating, not reproducible.  Pt denies cough, chill, fevers, dizziness, weakness.  Speaking full/clear sentences, appears winded, lsctab however diminished throughout.  Pt reports hx "lung nodules".  Skin pwd.  Denies diaphoresis, n/v/d.

## 2013-08-04 NOTE — ED Provider Notes (Signed)
CSN: [id number]     Arrival date & time 08/04/13  1658 History   First MD Initiated Contact with Patient 08/04/13 1858     Chief Complaint  Patient presents with  . Fatigue  . Shortness of Breath   (Consider location/radiation/quality/duration/timing/severity/associated sxs/prior Treatment) HPI Comments: Patient presents to the emergency department with chief complaints of fatigue. She states that the feeling began this morning. She states that it progressed throughout the day, and that she also reports associated shortness of breath, as well as some substernal chest pressure. She states that this pressure does not radiate, and it is not reproducible. She denies any cough, fever, chills, nausea, vomiting, diarrhea, constipation. Has not taken anything to alleviate her symptoms. She states that she has a history of lung nodules, and has a followup appointment tomorrow for lung function tests. He states that she stays tired because of her child. She states that her symptoms have basically resolved. She denies any pain now.  The history is provided by the patient. No language interpreter was used.    Past Medical History  Diagnosis Date  . Herpes     never had outbreak. pos per blood, doesn't know which type  . Polycystic ovarian syndrome   . Abnormal Pap smear 2006    leep  . History of chicken pox   . SVD (spontaneous vaginal delivery)     x 1   Past Surgical History  Procedure Laterality Date  . Knee surgery  1998    right knee  . Leep  2006  . Hysteroscopy  2009    uterine polyp  . Sphincterotomy  2010  . Ivf retrival  2010, 2011  . Uterine polyp removal    . Dilation and evacuation N/A 09/14/2012    Procedure: DILATATION AND EVACUATION;  Surgeon: Leslie Andrea, MD;  Location: [facility];  Service: Gynecology;  Laterality: N/A;   Family History  Problem Relation Age of Onset  . Diabetes Maternal Grandmother   . Heart disease Maternal Grandmother   . Hypertension Maternal  Grandfather   . Cancer Maternal Grandfather     bone, colon   . Diabetes Maternal Grandfather   . Diabetes Mother   . Cancer Mother     breast cancer  . Anesthesia problems Neg Hx   . Hypertension Father    History  Substance Use Topics  . Smoking status: Never Smoker   . Smokeless tobacco: Never Used  . Alcohol Use: No   OB History   Grav Para Term Preterm Abortions TAB SAB Ect Mult Living   4 1 1  0 2 0 1 1 0 1     Review of Systems  All other systems reviewed and are negative.    Allergies  Review of patient's allergies indicates no known allergies.  Home Medications  No current outpatient prescriptions on file. BP 127/88  Pulse 84  Temp(Src) 98.2 F (36.8 C) (Oral)  Resp 20  SpO2 98%  LMP 07/18/2013  Breastfeeding? No Physical Exam  Nursing note and vitals reviewed. Constitutional: She is oriented to person, place, and time. She appears well-developed and well-nourished.  HENT:  Head: Normocephalic and atraumatic.  Eyes: Conjunctivae and EOM are normal. Pupils are equal, round, and reactive to light.  Neck: Normal range of motion. Neck supple.  Cardiovascular: Normal rate and regular rhythm.  Exam reveals no gallop and no friction rub.   No murmur heard. Pulmonary/Chest: Effort normal and breath sounds normal. No respiratory distress. She  has no wheezes. She has no rales. She exhibits no tenderness.  Clear to auscultation bilaterally  Abdominal: Soft. She exhibits no distension and no mass. There is no tenderness. There is no rebound and no guarding.  Musculoskeletal: Normal range of motion. She exhibits no edema and no tenderness.  Neurological: She is alert and oriented to person, place, and time.  Skin: Skin is warm and dry.  Psychiatric: She has a normal mood and affect. Her behavior is normal. Judgment and thought content normal.    ED Course  Procedures (including critical care time) Results for orders placed during the hospital encounter of  08/04/13  CBC      Result Value Range   WBC 5.5  4.0 - 10.5 K/uL   RBC 4.35  3.87 - 5.11 MIL/uL   Hemoglobin 12.8  12.0 - 15.0 g/dL   HCT 38.5  36.0 - 46.0 %   MCV 88.5  78.0 - 100.0 fL   MCH 29.4  26.0 - 34.0 pg   MCHC 33.2  30.0 - 36.0 g/dL   RDW 14.2  11.5 - 15.5 %   Platelets 252  150 - 400 K/uL  BASIC METABOLIC PANEL      Result Value Range   Sodium 141  137 - 147 mEq/L   Potassium 3.8  3.7 - 5.3 mEq/L   Chloride 104  96 - 112 mEq/L   CO2 23  19 - 32 mEq/L   Glucose, Bld 136 (*) 70 - 99 mg/dL   BUN 10  6 - 23 mg/dL   Creatinine, Ser 0.62  0.50 - 1.10 mg/dL   Calcium 9.4  8.4 - 10.5 mg/dL   GFR calc non Af Amer >90  >90 mL/min   GFR calc Af Amer >90  >90 mL/min  POCT I-STAT TROPONIN I      Result Value Range   Troponin i, poc 0.01  0.00 - 0.08 ng/mL   Comment 3           POCT PREGNANCY, URINE      Result Value Range   Preg Test, Ur NEGATIVE  NEGATIVE  POCT I-STAT TROPONIN I      Result Value Range   Troponin i, poc 0.01  0.00 - 0.08 ng/mL   Comment 3            Dg Chest 2 View  08/04/2013   CLINICAL DATA:  Fatigue.  Shortness of breath.  EXAM: CHEST  2 VIEW  COMPARISON:  CT CHEST W/CM dated 06/22/2013  FINDINGS: Midline trachea.  Normal heart size and mediastinal contours.  Sharp costophrenic angles.  No pneumothorax.  Clear lungs.  IMPRESSION: Normal chest.   Electronically Signed   By: Abigail Miyamoto M.D.   On: 08/04/2013 18:12     Dg Chest 2 View  08/04/2013   CLINICAL DATA:  Fatigue.  Shortness of breath.  EXAM: CHEST  2 VIEW  COMPARISON:  CT CHEST W/CM dated 06/22/2013  FINDINGS: Midline trachea.  Normal heart size and mediastinal contours.  Sharp costophrenic angles.  No pneumothorax.  Clear lungs.  IMPRESSION: Normal chest.   Electronically Signed   By: Abigail Miyamoto M.D.   On: 08/04/2013 18:12    EKG Interpretation   None       MDM   1. SOB (shortness of breath)     Patient with fatigue that started this morning. Patient is well-appearing. His labs are  normal. Patient is not in any apparent distress.  Patient discussed  with Dr. Darl Householder, who recommends repeat troponin.  HEART score is 1 at the most.  Low risk for discharge to home.  PERC negative.  2nd troponin is negative.  Patient is pain free.  Discharge to home.    Montine Circle, PA-C 08/04/13 2147

## 2013-08-04 NOTE — Discharge Instructions (Signed)
Shortness of Breath  Shortness of breath means you have trouble breathing. Shortness of breath needs medical care right away.  HOME CARE   · Do not smoke.  · Avoid being around chemicals or things (paint fumes, dust) that may bother your breathing.  · Rest as needed. Slowly begin your normal activities.  · Only take medicines as told by your doctor.  · Keep all doctor visits as told.  GET HELP RIGHT AWAY IF:   · Your shortness of breath gets worse.  · You feel lightheaded, pass out (faint), or have a cough that is not helped by medicine.  · You cough up blood.  · You have pain with breathing.  · You have pain in your chest, arms, shoulders, or belly (abdomen).  · You have a fever.  · You cannot walk up stairs or exercise the way you normally do.  · You do not get better in the time expected.  · You have a hard time doing normal activities even with rest.  · You have problems with your medicines.  · You have any new symptoms.  MAKE SURE YOU:  · Understand these instructions.  · Will watch your condition.  · Will get help right away if you are not doing well or get worse.  Document Released: 12/17/2007 Document Revised: 12/30/2011 Document Reviewed: 09/15/2011  [employer]® Patient Information ©2014 [employer].

## 2013-08-05 ENCOUNTER — Encounter: Payer: Self-pay | Admitting: Emergency Medicine

## 2013-08-05 ENCOUNTER — Ambulatory Visit (INDEPENDENT_AMBULATORY_CARE_PROVIDER_SITE_OTHER): Payer: BC Managed Care – PPO | Admitting: Emergency Medicine

## 2013-08-05 VITALS — BP 100/70 | HR 84 | Ht 67.0 in | Wt 231.0 lb

## 2013-08-05 DIAGNOSIS — R9389 Abnormal findings on diagnostic imaging of other specified body structures: Secondary | ICD-10-CM

## 2013-08-05 DIAGNOSIS — R911 Solitary pulmonary nodule: Secondary | ICD-10-CM

## 2013-08-05 LAB — PULMONARY FUNCTION TEST
DL/VA % pred: 113 %
DL/VA: 5.84 ml/min/mmHg/L
DLCO unc % pred: 74 %
DLCO unc: 21.09 ml/min/mmHg
FEF 25-75 PRE: 3.2 L/s
FEF 25-75 Post: 1.7 L/sec
FEF2575-%CHANGE-POST: -46 %
FEF2575-%PRED-POST: 51 %
FEF2575-%Pred-Pre: 97 %
FEV1-%CHANGE-POST: -13 %
FEV1-%PRED-POST: 74 %
FEV1-%PRED-PRE: 86 %
FEV1-PRE: 2.53 L
FEV1-Post: 2.18 L
FEV1FVC-%Change-Post: -11 %
FEV1FVC-%PRED-PRE: 101 %
FEV6-%Change-Post: -5 %
FEV6-%Pred-Post: 78 %
FEV6-%Pred-Pre: 83 %
FEV6-POST: 2.72 L
FEV6-Pre: 2.89 L
FEV6FVC-%PRED-PRE: 101 %
FEV6FVC-%Pred-Post: 101 %
FVC-%CHANGE-POST: -2 %
FVC-%Pred-Post: 81 %
FVC-%Pred-Pre: 83 %
FVC-Post: 2.85 L
FVC-Pre: 2.93 L
POST FEV1/FVC RATIO: 76 %
PRE FEV1/FVC RATIO: 86 %
Post FEV6/FVC ratio: 100 %
Pre FEV6/FVC Ratio: 100 %
RV % pred: 66 %
RV: 1.07 L
TLC % PRED: 70 %
TLC: 3.87 L

## 2013-08-05 NOTE — Patient Instructions (Signed)
Your pulmonary function testing shows normal airflow We will repeat your CT scan of the chest in December 2015. You will be called to arrange this.  Please call our office if you develop any chest discomfort or new breathing symptoms.  Otherwise follow with Dr Lamonte Sakai in December after the scan is done to review the results

## 2013-08-05 NOTE — Assessment & Plan Note (Signed)
Repeat CT chest in Dec. 2015

## 2013-08-05 NOTE — Progress Notes (Signed)
pft done. Dae Antonucci, CMA  

## 2013-08-05 NOTE — Progress Notes (Signed)
   Subjective:    Patient ID: Brenda Williams, female    DOB: Jul 30, 1980, 33 y.o.   MRN: [id number]  HPI 33 yo woman, never smoker, little PMH. Referred for abnormal CT scan characterized by cystic changes, a single stable RML nodule. Original CT was in 3/'14 after she had anesthesia trouble following d&c. Denies dyspnea except when lying supine. No CP, no cough.   ROV 08/05/13 -- pt follows up for cystic changes and a RML nodule on CT scan chest. She had PFT today that show no AFL but some fatigability, decreased lung volumes and low DLCO that corrects for Va.  Her a1-AT is 110 (low normal) but genotype not reported. She was in the ED yesterday 1/22 for extreme fatigue, some mild SOB, no constitutional sx. She has otherwise been well. No significant cough, sometimes at night.        Review of Systems  Constitutional: Negative for fever and unexpected weight change.  HENT: Negative for congestion, dental problem, ear pain, nosebleeds, postnasal drip, rhinorrhea, sinus pressure, sneezing, sore throat and trouble swallowing.   Eyes: Negative for redness and itching.  Respiratory: Positive for cough and shortness of breath. Negative for chest tightness and wheezing.   Cardiovascular: Negative for palpitations and leg swelling.  Gastrointestinal: Negative for nausea and vomiting.  Genitourinary: Negative for dysuria.  Musculoskeletal: Negative for joint swelling.  Skin: Negative for rash.  Neurological: Negative for headaches.  Hematological: Does not bruise/bleed easily.  Psychiatric/Behavioral: Negative for dysphoric mood. The patient is not nervous/anxious.        Objective:   Physical Exam Filed Vitals:   08/05/13 1556  BP: 100/70  Pulse: 84  Height: 5\' 7"  (1.702 m)  Weight: 231 lb (104.781 kg)  SpO2: 98%   Gen: Pleasant, obese, in no distress,  normal affect  ENT: No lesions,  mouth clear,  oropharynx clear, no postnasal drip  Neck: No JVD, no TMG, no carotid bruits  Lungs:  No use of accessory muscles, clear without rales or rhonchi  Cardiovascular: RRR, heart sounds normal, no murmur or gallops, no peripheral edema  Abdomen: soft and NT, no HSM,  BS normal  Musculoskeletal: No deformities, no cyanosis or clubbing  Neuro: alert, non focal  Skin: Warm, no lesions or rashes       Assessment & Plan:  Solitary pulmonary nodule Repeat CT chest in Dec. 2015  Abnormal CT scan, chest Spirometry reassuring. Her post-bd trials showed worsening, possible AFL but more likely effort/fatigue. Etiology of cysts remains unclear. She has no sx associated with her menstrual cycle to support LAM. At this point I believe we can follow her, wait for the repeat Ct scan in December. She will call me sooner if her breathing changes.

## 2013-08-05 NOTE — Assessment & Plan Note (Signed)
Spirometry reassuring. Her post-bd trials showed worsening, possible AFL but more likely effort/fatigue. Etiology of cysts remains unclear. She has no sx associated with her menstrual cycle to support LAM. At this point I believe we can follow her, wait for the repeat Ct scan in December. She will call me sooner if her breathing changes.

## 2013-12-22 ENCOUNTER — Emergency Department (HOSPITAL_COMMUNITY): Payer: BC Managed Care – PPO

## 2013-12-22 ENCOUNTER — Encounter (HOSPITAL_COMMUNITY): Payer: Self-pay | Admitting: Emergency Medicine

## 2013-12-22 ENCOUNTER — Emergency Department (HOSPITAL_COMMUNITY)
Admission: EM | Admit: 2013-12-22 | Discharge: 2013-12-22 | Disposition: A | Discharge status: Home / Self Care | Payer: BC Managed Care – PPO | Attending: Emergency Medicine | Admitting: Emergency Medicine

## 2013-12-22 DIAGNOSIS — B9789 Other viral agents as the cause of diseases classified elsewhere: Secondary | ICD-10-CM | POA: Insufficient documentation

## 2013-12-22 DIAGNOSIS — B349 Viral infection, unspecified: Secondary | ICD-10-CM

## 2013-12-22 LAB — URINE MICROSCOPIC-ADD ON

## 2013-12-22 LAB — URINALYSIS, ROUTINE W REFLEX MICROSCOPIC
Bilirubin Urine: NEGATIVE
Glucose, UA: NEGATIVE mg/dL
Hgb urine dipstick: NEGATIVE
Ketones, ur: NEGATIVE mg/dL
NITRITE: NEGATIVE
PH: 6 (ref 5.0–8.0)
Protein, ur: NEGATIVE mg/dL
SPECIFIC GRAVITY, URINE: 1.024 (ref 1.005–1.030)
UROBILINOGEN UA: 1 mg/dL (ref 0.0–1.0)

## 2013-12-22 LAB — RAPID STREP SCREEN (MED CTR MEBANE ONLY): STREPTOCOCCUS, GROUP A SCREEN (DIRECT): NEGATIVE

## 2013-12-22 NOTE — ED Notes (Signed)
Pt states that she has been having fever x 2 days w/ body aches.  Denies any other sx.  Went to urgent care yesterday.  States that she was told it was a virus "but they didn't do anything".  Was prescribed prednisone but did not get it filled.  States she took her temperature this morning.  It was 100.8.  Normal temp now.

## 2013-12-22 NOTE — ED Provider Notes (Signed)
CSN: [id number]     Arrival date & time 12/22/13  9476 History   First MD Initiated Contact with Patient 12/22/13 [phone]     Chief Complaint  Patient presents with  . Fever  . Generalized Body Aches     (Consider location/radiation/quality/duration/timing/severity/associated sxs/prior Treatment) HPI Comments: Patient presents with 2 days of fever up to 101 as well as "bones aching". She denies any other symptoms. She denies any cough, runny nose, sore throat, abdominal pain, chest pain or shortness of breath. She denies any sick contacts. She was seen at urgent care yesterday and given prednisone she says for her chronic knee pain. She did not fill it. She denies any rash, chest pain, shortness of breath, pain or swelling in her joints. She recently traveled to [facility] She's been taking Tylenol Motrin as needed for fever with partial relief.  The history is provided by the patient.    Past Medical History  Diagnosis Date  . Herpes     never had outbreak. pos per blood, doesn't know which type  . Polycystic ovarian syndrome   . Abnormal Pap smear 2006    leep  . History of chicken pox   . SVD (spontaneous vaginal delivery)     x 1   Past Surgical History  Procedure Laterality Date  . Knee surgery  1998    right knee  . Leep  2006  . Hysteroscopy  2009    uterine polyp  . Sphincterotomy  2010  . Ivf retrival  2010, 2011  . Uterine polyp removal    . Dilation and evacuation N/A 09/14/2012    Procedure: DILATATION AND EVACUATION;  Surgeon: Allena Katz, MD;  Location: [facility];  Service: Gynecology;  Laterality: N/A;   Family History  Problem Relation Age of Onset  . Diabetes Maternal Grandmother   . Heart disease Maternal Grandmother   . Hypertension Maternal Grandfather   . Cancer Maternal Grandfather     bone, colon   . Diabetes Maternal Grandfather   . Diabetes Mother   . Cancer Mother     breast cancer  . Anesthesia problems Neg Hx   . Hypertension Father     History  Substance Use Topics  . Smoking status: Never Smoker   . Smokeless tobacco: Never Used  . Alcohol Use: No   OB History   Grav Para Term Preterm Abortions TAB SAB Ect Mult Living   4 1 1  0 2 0 1 1 0 1     Review of Systems  Constitutional: Positive for fever, activity change and appetite change. Negative for fatigue.  HENT: Negative for congestion and rhinorrhea.   Respiratory: Negative for cough, chest tightness and shortness of breath.   Cardiovascular: Negative for chest pain.  Gastrointestinal: Negative for nausea, vomiting and abdominal pain.  Genitourinary: Negative for dysuria and hematuria.  Musculoskeletal: Positive for arthralgias and myalgias. Negative for back pain.  Skin: Negative for rash.  Neurological: Negative for dizziness, weakness and headaches.  A complete 10 system review of systems was obtained and all systems are negative except as noted in the HPI and PMH.      Allergies  Review of patient's allergies indicates no known allergies.  Home Medications   Prior to Admission medications   Medication Sig Start Date End Date Taking? Authorizing Provider  acetaminophen (TYLENOL) 500 MG tablet Take 1,000 mg by mouth every 6 (six) hours as needed for mild pain.   Yes Historical Provider, MD  BP 113/68  Pulse 85  Temp(Src) 98.9 F (37.2 C) (Oral)  Resp 18  SpO2 100%  LMP 11/21/2013 Physical Exam  Nursing note and vitals reviewed. Constitutional: She is oriented to person, place, and time. She appears well-developed and well-nourished. No distress.  HENT:  Head: Normocephalic and atraumatic.  Right Ear: External ear normal.  Left Ear: External ear normal.  Mouth/Throat: Oropharynx is clear and moist. No oropharyngeal exudate.  Eyes: Conjunctivae and EOM are normal. Pupils are equal, round, and reactive to light.  Neck: Normal range of motion. Neck supple.   No meningismus  Cardiovascular: Normal rate, regular rhythm and normal heart  sounds.   No murmur heard. Pulmonary/Chest: Effort normal and breath sounds normal. No respiratory distress.  Abdominal: Soft. There is no tenderness. There is no rebound and no guarding.  Musculoskeletal: Normal range of motion. She exhibits no edema and no tenderness.  Full range of motion of all major joints. No erythema or edema.  Neurological: She is alert and oriented to person, place, and time. No cranial nerve deficit. She exhibits normal muscle tone. Coordination normal.  Skin: Skin is warm. No rash noted.    ED Course  Procedures (including critical care time) Labs Review Labs Reviewed  URINALYSIS, ROUTINE W REFLEX MICROSCOPIC - Abnormal; Notable for the following:    APPearance CLOUDY (*)    Leukocytes, UA SMALL (*)    All other components within normal limits  URINE MICROSCOPIC-ADD ON - Abnormal; Notable for the following:    Bacteria, UA FEW (*)    All other components within normal limits  RAPID STREP SCREEN  CULTURE, GROUP A STREP    Imaging Review Dg Chest 2 View  12/22/2013   CLINICAL DATA:  Fever  EXAM: CHEST  2 VIEW  COMPARISON:  08/04/2013  FINDINGS: The heart size and mediastinal contours are within normal limits. Both lungs are clear. The visualized skeletal structures are unremarkable.  IMPRESSION: Normal chest radiographs   Electronically Signed   By: Lajean Manes M.D.   On: 12/22/2013 09:05     EKG Interpretation None      MDM   Final diagnoses:  Viral syndrome   Bodyaches and fever for the past 3 days. No distress. Nontoxic appearance. Denies any other associated symptoms  Chest x-ray is negative. Rapid strep is negative. Urinalysis showed no infection.  Patient appears well nontoxic. Supportive care discussed for likely viral syndrome. She is encouraged to rest at home, by mouth hydration, Tylenol Motrin as needed for fever.  Ezequiel Essex, MD 12/22/13 1029

## 2013-12-22 NOTE — Discharge Instructions (Signed)

## 2013-12-22 NOTE — ED Notes (Signed)
Patient reports she has had some lower back pain.  Denies pain with urination.

## 2013-12-24 LAB — CULTURE, GROUP A STREP

## 2014-01-20 ENCOUNTER — Ambulatory Visit: Payer: BC Managed Care – PPO | Admitting: Emergency Medicine

## 2014-02-23 ENCOUNTER — Encounter: Payer: Self-pay | Admitting: Adult Health

## 2014-02-23 ENCOUNTER — Ambulatory Visit (INDEPENDENT_AMBULATORY_CARE_PROVIDER_SITE_OTHER): Payer: BC Managed Care – PPO | Admitting: Adult Health

## 2014-02-23 VITALS — BP 112/64 | HR 63 | Temp 98.0°F | Ht 66.5 in | Wt 229.6 lb

## 2014-02-23 DIAGNOSIS — J209 Acute bronchitis, unspecified: Secondary | ICD-10-CM | POA: Insufficient documentation

## 2014-02-23 DIAGNOSIS — R911 Solitary pulmonary nodule: Secondary | ICD-10-CM

## 2014-02-23 NOTE — Patient Instructions (Signed)
Mucinex DM Twice daily  As needed  Cough/congestion  Follow up Dr. Lamonte Sakai  In December after CT chest

## 2014-02-23 NOTE — Assessment & Plan Note (Signed)
Plan for repeat CT chest 06/2014 w/ follow up ov with Dr. Lamonte Sakai

## 2014-02-23 NOTE — Assessment & Plan Note (Signed)
Acute Bronchitis -appears to be resolving.  Initial chest xray clear.  Advised may use Mucinex DM Twice daily  As needed  Cough/congestion.

## 2014-02-23 NOTE — Progress Notes (Signed)
Subjective:    Patient ID: Brenda Williams, female    DOB: Oct 13, 1980, 33 y.o.   MRN: [id number]  HPI 33 yo woman, never smoker, little PMH. Referred for abnormal CT scan characterized by cystic changes, a single stable RML nodule. Original CT was in 3/'14 after she had anesthesia trouble following d&c. Denies dyspnea except when lying supine. No CP, no cough.   ROV 08/05/13 -- pt follows up for cystic changes and a RML nodule on CT scan chest. She had PFT today that show no AFL but some fatigability, decreased lung volumes and low DLCO that corrects for Va.  Her a1-AT is 110 (low normal) but genotype not reported. She was in the ED yesterday 1/22 for extreme fatigue, some mild SOB, no constitutional sx. She has otherwise been well. No significant cough, sometimes at night.        02/23/2014 Acute OV  Complains of prod cough with yellow/clear mucus, head congestion w/ yellow mucus, PND, increased SOB, wheezing, fever up to 102 x 6 weeks.  Fevers have resolved. Cough is resolving. Still has some lingering clear mucus.  Reports symptoms are now improved x1-2weeks.   Cough was worse at night, keeping her up at night. That has improved.  Denies any hemoptysis, tightness, leg swelling, chest pain, n/v/d. No abx use.  Went to ER initially on 12/22/13 dx w/ viral syndrome. CXR neg.  Has known pulmonary nodule with planned CT chest 06/2014.      Review of Systems Constitutional:   No  weight loss, night sweats,  Fevers, chills, fatigue, or  lassitude.  HEENT:   No headaches,  Difficulty swallowing,  Tooth/dental problems, or  Sore throat,                No sneezing, itching, ear ache, nasal congestion, post nasal drip,   CV:  No chest pain,  Orthopnea, PND, swelling in lower extremities, anasarca, dizziness, palpitations, syncope.   GI  No heartburn, indigestion, abdominal pain, nausea, vomiting, diarrhea, change in bowel habits, loss of appetite, bloody stools.   Resp: No shortness of  breath with exertion or at rest.  No excess mucus,    No coughing up of blood.  No change in color of mucus.  No wheezing.  No chest wall deformity  Skin: no rash or lesions.  GU: no dysuria, change in color of urine, no urgency or frequency.  No flank pain, no hematuria   MS:  No joint pain or swelling.  No decreased range of motion.  No back pain.  Psych:  No change in mood or affect. No depression or anxiety.  No memory loss.         Objective:   Physical Exam GEN: A/Ox3; pleasant , NAD  HEENT:  [place]/AT,  EACs-clear, TMs-wnl, NOSE-clear, THROAT-clear, no lesions, no postnasal drip or exudate noted.   NECK:  Supple w/ fair ROM; no JVD; normal carotid impulses w/o bruits; no thyromegaly or nodules palpated; no lymphadenopathy.  RESP  Clear  P & A; w/o, wheezes/ rales/ or rhonchi.no accessory muscle use, no dullness to percussion  CARD:  RRR, no m/r/g  , no peripheral edema, pulses intact, no cyanosis or clubbing.  GI:   Soft & nt; nml bowel sounds; no organomegaly or masses detected.  Musco: Warm bil, no deformities or joint swelling noted.   Neuro: alert, no focal deficits noted.    Skin: Warm, no lesions or rashes         Assessment &  Plan:

## 2014-05-15 ENCOUNTER — Encounter: Payer: Self-pay | Admitting: Adult Health

## 2014-06-30 ENCOUNTER — Telehealth: Payer: Self-pay | Admitting: Emergency Medicine

## 2014-06-30 ENCOUNTER — Inpatient Hospital Stay: Admission: RE | Admit: 2014-06-30 | Payer: BC Managed Care – PPO | Source: Ambulatory Visit

## 2014-06-30 ENCOUNTER — Ambulatory Visit (HOSPITAL_BASED_OUTPATIENT_CLINIC_OR_DEPARTMENT_OTHER)
Admission: RE | Admit: 2014-06-30 | Discharge: 2014-06-30 | Disposition: A | Discharge status: Home / Self Care | Payer: 59 | Source: Ambulatory Visit | Attending: Emergency Medicine | Admitting: Emergency Medicine

## 2014-06-30 DIAGNOSIS — R911 Solitary pulmonary nodule: Secondary | ICD-10-CM | POA: Diagnosis present

## 2014-06-30 NOTE — Telephone Encounter (Signed)
Called spoke with pt. She had CT scan done today 06/30/14. She is requesting results. Aware RB off this afternoon. Please advise thanks

## 2014-07-04 NOTE — Telephone Encounter (Signed)
lmomtcb for pt 

## 2014-07-04 NOTE — Telephone Encounter (Signed)
RB please advise of CT results.   thanks

## 2014-07-04 NOTE — Telephone Encounter (Signed)
Please let her know that her nodule is stable. No new findings. We will repeat her film one more time. If stable then she will not need any more.

## 2014-07-05 NOTE — Telephone Encounter (Signed)
Spoke with pt.  Discussed below per RB with her.  She verbalized understanding.  Dr. Lamonte Sakai, pt reports results from scan done in Dec 2014 showed "cysts all over both lungs."  States this was not seen until contrast was injected.  She thought f/u was regarding this; not the individual nodule.  She is requesting further clarification.  Please advise.  Thank you.

## 2014-07-10 NOTE — Telephone Encounter (Signed)
Pt calling back and is wanting to know more a/bout the cyst found,please advise

## 2014-07-10 NOTE — Telephone Encounter (Signed)
Spoke with the pt and notified that RB has not had a chance to respond to her msg yet  We will call her once he responds  Pt verbalized understanding  Please advise thanks

## 2014-07-11 NOTE — Telephone Encounter (Signed)
I went back and reviewed all of the CT scans - the pulmonary nodule is definitely stable. The cysts in question were mentioned on a prior report, but the scan looks to me to be more consistent with scattered areas of emphysema. I can see why they may have been called cysts on an older scan, but this actually is more consistent with either COPD or asthma. We will need to review further when we follow up in office.

## 2014-07-11 NOTE — Telephone Encounter (Signed)
Called and spoke to pt. Informed pt of the results per RB. Pt verbalized understanding and denied any further questions or concerns at this time. Pt's appt was suppose to be in 06/2014 to review CT. Pt is booked for first available appt with RB on 08/22/14.    OV with TP in 02/2014:  Patient Instructions     Mucinex DM Twice daily As needed Cough/congestion  Follow up Dr. Lamonte Sakai In December after CT chest       RB is this appt (08/22/14) too late?

## 2014-07-12 NOTE — Telephone Encounter (Signed)
February appointment is OK

## 2014-07-12 NOTE — Telephone Encounter (Signed)
Spoke with the pt and notified of recs per RB  She verbalized understanding  Nothing further needed 

## 2014-07-24 ENCOUNTER — Encounter (HOSPITAL_COMMUNITY): Payer: Self-pay | Admitting: *Deleted

## 2014-07-24 ENCOUNTER — Inpatient Hospital Stay (HOSPITAL_COMMUNITY): Payer: 59

## 2014-07-24 ENCOUNTER — Inpatient Hospital Stay (HOSPITAL_COMMUNITY)
Admission: AD | Admit: 2014-07-24 | Discharge: 2014-07-25 | Disposition: A | Discharge status: Home / Self Care | Payer: 59 | Source: Ambulatory Visit | Attending: Obstetrics and Gynecology | Admitting: Obstetrics and Gynecology

## 2014-07-24 DIAGNOSIS — O09521 Supervision of elderly multigravida, first trimester: Secondary | ICD-10-CM | POA: Insufficient documentation

## 2014-07-24 DIAGNOSIS — Z3A01 Less than 8 weeks gestation of pregnancy: Secondary | ICD-10-CM | POA: Diagnosis not present

## 2014-07-24 DIAGNOSIS — R109 Unspecified abdominal pain: Secondary | ICD-10-CM | POA: Diagnosis present

## 2014-07-24 DIAGNOSIS — O209 Hemorrhage in early pregnancy, unspecified: Secondary | ICD-10-CM | POA: Diagnosis not present

## 2014-07-24 LAB — CBC
HCT: 37.5 % (ref 36.0–46.0)
HEMOGLOBIN: 12.6 g/dL (ref 12.0–15.0)
MCH: 29.9 pg (ref 26.0–34.0)
MCHC: 33.6 g/dL (ref 30.0–36.0)
MCV: 88.9 fL (ref 78.0–100.0)
Platelets: 300 10*3/uL (ref 150–400)
RBC: 4.22 MIL/uL (ref 3.87–5.11)
RDW: 14.5 % (ref 11.5–15.5)
WBC: 7.8 10*3/uL (ref 4.0–10.5)

## 2014-07-24 LAB — WET PREP, GENITAL
TRICH WET PREP: NONE SEEN
Yeast Wet Prep HPF POC: NONE SEEN

## 2014-07-24 LAB — HCG, QUANTITATIVE, PREGNANCY: HCG, BETA CHAIN, QUANT, S: 10 m[IU]/mL — AB (ref <5)

## 2014-07-24 LAB — POCT PREGNANCY, URINE: Preg Test, Ur: NEGATIVE

## 2014-07-24 NOTE — MAU Provider Note (Signed)
Chief Complaint: No chief complaint on file.      SUBJECTIVE HPI: Brenda Williams is a 34 y.o. [id number] at [redacted]w[redacted]d who presents with onset today of lower abdominal pain more severe than menstrual cramping and bright red bleeding starting a few hours ago. Did not pass clots or tissue. Has used 2 pads. She been followed in the office for low back pain in early pregnancy and last week had 2 quantitative beta-hCG tests. She states the first was under 100 and the 2d F/U quant was higher but did not double. Menses are irregular about every 2 months due to her PCOS.  Pregnancy course: AMA.; hx ectopic  Past Medical History  Diagnosis Date  . Herpes     never had outbreak. pos per blood, doesn't know which type  . Polycystic ovarian syndrome   . Abnormal Pap smear 2006    leep  . History of chicken pox   . SVD (spontaneous vaginal delivery)     x 1   OB History  Gravida Para Term Preterm AB SAB TAB Ectopic Multiple Living  5 1 1  0 2 1 0 1 0 1    # Outcome Date GA Lbr Len/2nd Weight Sex Delivery Anes PTL Lv  5 Current           4 Term 11/26/11 [redacted]w[redacted]d 11:47 / 03:00 3.34 kg (7 lb 5.8 oz) F Vag-Vacuum EPI  Y     Comments: No anomalies noted, caput noted  3 Ectopic 02/11/10             Comments: methotrexate  2 Gravida              Comments: System Generated. Please review and update pregnancy details.  1 SAB              Comments: System Generated. Please review and update pregnancy details.     Past Surgical History  Procedure Laterality Date  . Knee surgery  1998    right knee  . Leep  2006  . Hysteroscopy  2009    uterine polyp  . Sphincterotomy  2010  . Ivf retrival  2010, 2011  . Uterine polyp removal    . Dilation and evacuation N/A 09/14/2012    Procedure: DILATATION AND EVACUATION;  Surgeon: Allena Katz, MD;  Location: [facility];  Service: Gynecology;  Laterality: N/A;   History   Social History  . Marital Status: Married    Spouse Name: N/A    Number of Children: N/A   . Years of Education: N/A   Occupational History  . Not on file.   Social History Main Topics  . Smoking status: Never Smoker   . Smokeless tobacco: Never Used  . Alcohol Use: No  . Drug Use: No  . Sexual Activity: Yes    Birth Control/ Protection: None     Comment: approx [redacted] wks gestation per patient   Other Topics Concern  . Not on file   Social History Narrative   No current facility-administered medications on file prior to encounter.   Current Outpatient Prescriptions on File Prior to Encounter  Medication Sig Dispense Refill  . acetaminophen (TYLENOL) 500 MG tablet Take 1,000 mg by mouth every 6 (six) hours as needed for mild pain.     No Known Allergies  ROS: Pertinent items in HPI  OBJECTIVE Blood pressure 124/80, pulse 85, temperature 98.8 F (37.1 C), temperature source Oral, resp. rate 18, height 5\' 7"  (1.702 m),  weight 106.595 kg (235 lb), last menstrual period 06/09/2014, SpO2 97 %. GENERAL: Obese female in no acute distress.  HEENT: Normocephalic HEART: normal rate RESP: normal effort ABDOMEN: Soft, non-tender EXTREMITIES: Nontender, no edema NEURO: Alert and oriented SPECULUM EXAM: NEFG, physiologic discharge, moderate red blood in vault, little active bleeding, cervix clean BIMANUAL: cervix closed, indurated; uterus NT, no adnexal tenderness or masses  LAB RESULTS Results for orders placed or performed during the hospital encounter of 07/24/14 (from the past 24 hour(s))  Urinalysis, Routine w reflex microscopic     Status: Abnormal   Collection Time: 07/24/14 11:00 PM  Result Value Ref Range   Color, Urine YELLOW YELLOW   APPearance CLEAR CLEAR   Specific Gravity, Urine >1.030 (H) 1.005 - 1.030   pH 6.5 5.0 - 8.0   Glucose, UA NEGATIVE NEGATIVE mg/dL   Hgb urine dipstick LARGE (A) NEGATIVE   Bilirubin Urine NEGATIVE NEGATIVE   Ketones, ur 15 (A) NEGATIVE mg/dL   Protein, ur 100 (A) NEGATIVE mg/dL   Urobilinogen, UA 1.0 0.0 - 1.0 mg/dL    Nitrite NEGATIVE NEGATIVE   Leukocytes, UA NEGATIVE NEGATIVE  Urine microscopic-add on     Status: Abnormal   Collection Time: 07/24/14 11:00 PM  Result Value Ref Range   Squamous Epithelial / LPF FEW (A) RARE   WBC, UA 3-6 <3 WBC/hpf   RBC / HPF 21-50 <3 RBC/hpf   Bacteria, UA FEW (A) RARE   Urine-Other MUCOUS PRESENT   CBC     Status: None   Collection Time: 07/24/14 11:09 PM  Result Value Ref Range   WBC 7.8 4.0 - 10.5 K/uL   RBC 4.22 3.87 - 5.11 MIL/uL   Hemoglobin 12.6 12.0 - 15.0 g/dL   HCT 37.5 36.0 - 46.0 %   MCV 88.9 78.0 - 100.0 fL   MCH 29.9 26.0 - 34.0 pg   MCHC 33.6 30.0 - 36.0 g/dL   RDW 14.5 11.5 - 15.5 %   Platelets 300 150 - 400 K/uL  hCG, quantitative, pregnancy     Status: Abnormal   Collection Time: 07/24/14 11:09 PM  Result Value Ref Range   hCG, Beta Chain, Quant, S 10 (H) <5 mIU/mL  Wet prep, genital     Status: Abnormal   Collection Time: 07/24/14 11:15 PM  Result Value Ref Range   Yeast Wet Prep HPF POC NONE SEEN NONE SEEN   Trich, Wet Prep NONE SEEN NONE SEEN   Clue Cells Wet Prep HPF POC FEW (A) NONE SEEN   WBC, Wet Prep HPF POC FEW (A) NONE SEEN  Pregnancy, urine POC     Status: None   Collection Time: 07/24/14 11:56 PM  Result Value Ref Range   Preg Test, Ur NEGATIVE NEGATIVE    IMAGING  US Ob Comp Less 14 Wks  07/25/2014   CLINICAL DATA:  Bleeding in early pregnancy. Pregnant patient with abdominal pain. Beta HCG of 10.  EXAM: OBSTETRIC <14 WK [place] AND TRANSVAGINAL OB US  TECHNIQUE: Both transabdominal and transvaginal ultrasound examinations were performed for complete evaluation of the gestation as well as the maternal uterus, adnexal regions, and pelvic cul-de-sac. Transvaginal technique was performed to assess early pregnancy.  COMPARISON:  None this pregnancy.  FINDINGS: Intrauterine gestational sac: Not visualized.  Yolk sac:  Not visualized.  Embryo:  Not visualized.  Maternal uterus/adnexae: The endometrium measures 8 mm. Small cyst in the  cervix. Both ovaries are seen transabdominally and transvaginally. The right ovary measures 3.5 x  2.7 x 3.2 cm and appears normal with normal blood flow. The left ovary measures 3.3 x 2.1 x 4.0 cm and appears normal with normal blood flow. There is no adnexal mass. Trace free fluid in the cul-de-sac.  IMPRESSION: 1. No intrauterine gestation. No findings to suggest ectopic pregnancy. This is most consistent with pregnancy of the known location. This may reflect very early intrauterine pregnancy too early to be detected sonographically given a beta HCG of 10. Recommend follow-up quantitative B-HCG levels and follow-up US in 10-14 days. 2. Normal appearance of the ovaries. No findings to suggest ectopic pregnancy at this time.   Electronically Signed   By: Jeb Levering M.D.   On: 07/25/2014 00:41   US Ob Transvaginal  07/25/2014   CLINICAL DATA:  Bleeding in early pregnancy. Pregnant patient with abdominal pain. Beta HCG of 10.  EXAM: OBSTETRIC <14 WK [place] AND TRANSVAGINAL OB US  TECHNIQUE: Both transabdominal and transvaginal ultrasound examinations were performed for complete evaluation of the gestation as well as the maternal uterus, adnexal regions, and pelvic cul-de-sac. Transvaginal technique was performed to assess early pregnancy.  COMPARISON:  None this pregnancy.  FINDINGS: Intrauterine gestational sac: Not visualized.  Yolk sac:  Not visualized.  Embryo:  Not visualized.  Maternal uterus/adnexae: The endometrium measures 8 mm. Small cyst in the cervix. Both ovaries are seen transabdominally and transvaginally. The right ovary measures 3.5 x 2.7 x 3.2 cm and appears normal with normal blood flow. The left ovary measures 3.3 x 2.1 x 4.0 cm and appears normal with normal blood flow. There is no adnexal mass. Trace free fluid in the cul-de-sac.  IMPRESSION: 1. No intrauterine gestation. No findings to suggest ectopic pregnancy. This is most consistent with pregnancy of the known location. This may reflect  very early intrauterine pregnancy too early to be detected sonographically given a beta HCG of 10. Recommend follow-up quantitative B-HCG levels and follow-up US in 10-14 days. 2. Normal appearance of the ovaries. No findings to suggest ectopic pregnancy at this time.   Electronically Signed   By: Jeb Levering M.D.   On: 07/25/2014 00:41    MAU COURSE Consulted with Dr. Gaetano Net re: [place] assumed by Marcille Buffy, CNM   Brenda Williams, CNM 07/24/2014  11:04 PM  ASSESSMENT 1. Bleeding in early pregnancy     PLAN  FU with the office as planned for Thursday Bleeding precautions/SAB precautions reviewed  Return to MAU as needed   Follow-up Information    Follow up with [facility] Marjean Donna, MD.   Specialty:  Obstetrics and Gynecology   Why:  As scheduled for Thursday    Contact information:   [place] [address] [phone]

## 2014-07-24 NOTE — MAU Note (Signed)
Pt states that pain comes in waves and is all lower abdominal into the back. Pt started bleeding today and is now having to wear a pad.

## 2014-07-24 NOTE — MAU Note (Signed)
Pt reports she started having really bad lower back and lower abd pain today. States she has had back pain since she had a positive preg test but not this bad. Vaginal bleeding that started today also.

## 2014-07-25 DIAGNOSIS — O209 Hemorrhage in early pregnancy, unspecified: Secondary | ICD-10-CM

## 2014-07-25 LAB — URINALYSIS, ROUTINE W REFLEX MICROSCOPIC
Bilirubin Urine: NEGATIVE
GLUCOSE, UA: NEGATIVE mg/dL
Ketones, ur: 15 mg/dL — AB
LEUKOCYTES UA: NEGATIVE
Nitrite: NEGATIVE
PROTEIN: 100 mg/dL — AB
Urobilinogen, UA: 1 mg/dL (ref 0.0–1.0)
pH: 6.5 (ref 5.0–8.0)

## 2014-07-25 LAB — URINE MICROSCOPIC-ADD ON

## 2014-07-25 LAB — GC/CHLAMYDIA PROBE AMP
CT PROBE, AMP APTIMA: NEGATIVE
GC PROBE AMP APTIMA: NEGATIVE

## 2014-07-25 MED ORDER — IBUPROFEN 800 MG PO TABS
800.0000 mg | ORAL_TABLET | Freq: Once | ORAL | Status: AC
  Administered 2014-07-25: 800 mg via ORAL
  Filled 2014-07-25: qty 1

## 2014-07-25 NOTE — MAU Note (Signed)
Pt wanting to leave after Ibuprofen po taken. Pt did not stay 30 minutes following administration.

## 2014-07-25 NOTE — Discharge Instructions (Signed)
Miscarriage A miscarriage is the sudden loss of an unborn baby (fetus) before the 20th week of pregnancy. Most miscarriages happen in the first 3 months of pregnancy. Sometimes, it happens before a woman even knows she is pregnant. A miscarriage is also called a "spontaneous miscarriage" or "early pregnancy loss." Having a miscarriage can be an emotional experience. Talk with your caregiver about any questions you may have about miscarrying, the grieving process, and your future pregnancy plans. CAUSES   Problems with the fetal chromosomes that make it impossible for the baby to develop normally. Problems with the baby's genes or chromosomes are most often the result of errors that occur, by chance, as the embryo divides and grows. The problems are not inherited from the parents.  Infection of the cervix or uterus.   Hormone problems.   Problems with the cervix, such as having an incompetent cervix. This is when the tissue in the cervix is not strong enough to hold the pregnancy.   Problems with the uterus, such as an abnormally shaped uterus, uterine fibroids, or congenital abnormalities.   Certain medical conditions.   Smoking, drinking alcohol, or taking illegal drugs.   Trauma.  Often, the cause of a miscarriage is unknown.  SYMPTOMS   Vaginal bleeding or spotting, with or without cramps or pain.  Pain or cramping in the abdomen or lower back.  Passing fluid, tissue, or blood clots from the vagina. DIAGNOSIS  Your caregiver will perform a physical exam. You may also have an ultrasound to confirm the miscarriage. Blood or urine tests may also be ordered. TREATMENT   Sometimes, treatment is not necessary if you naturally pass all the fetal tissue that was in the uterus. If some of the fetus or placenta remains in the body (incomplete miscarriage), tissue left behind may become infected and must be removed. Usually, a dilation and curettage (D and C) procedure is performed.  During a D and C procedure, the cervix is widened (dilated) and any remaining fetal or placental tissue is gently removed from the uterus.  Antibiotic medicines are prescribed if there is an infection. Other medicines may be given to reduce the size of the uterus (contract) if there is a lot of bleeding.  If you have Rh negative blood and your baby was Rh positive, you will need a Rh immunoglobulin shot. This shot will protect any future baby from having Rh blood problems in future pregnancies. HOME CARE INSTRUCTIONS   Your caregiver may order bed rest or may allow you to continue light activity. Resume activity as directed by your caregiver.  Have someone help with home and family responsibilities during this time.   Keep track of the number of sanitary pads you use each day and how soaked (saturated) they are. Write down this information.   Do not use tampons. Do not douche or have sexual intercourse until approved by your caregiver.   Only take over-the-counter or prescription medicines for pain or discomfort as directed by your caregiver.   Do not take aspirin. Aspirin can cause bleeding.   Keep all follow-up appointments with your caregiver.   If you or your partner have problems with grieving, talk to your caregiver or seek counseling to help cope with the pregnancy loss. Allow enough time to grieve before trying to get pregnant again.  SEEK IMMEDIATE MEDICAL CARE IF:   You have severe cramps or pain in your back or abdomen.  You have a fever.  You pass large blood clots (walnut-sized   or larger) ortissue from your vagina. Save any tissue for your caregiver to inspect.   Your bleeding increases.   You have a thick, bad-smelling vaginal discharge.  You become lightheaded, weak, or you faint.   You have chills.  MAKE SURE YOU:  Understand these instructions.  Will watch your condition.  Will get help right away if you are not doing well or get  worse. Document Released: 12/24/2000 Document Revised: 10/25/2012 Document Reviewed: 08/19/2011 [employer] Patient Information 2015 [employer]. This information is not intended to replace advice given to you by your health care provider. Make sure you discuss any questions you have with your health care provider.  

## 2014-07-26 LAB — HIV ANTIBODY (ROUTINE TESTING W REFLEX): HIV 1/HIV 2 AB: NONREACTIVE

## 2014-07-31 ENCOUNTER — Telehealth: Payer: Self-pay | Admitting: Emergency Medicine

## 2014-07-31 NOTE — Telephone Encounter (Signed)
[email or web address] x1 Don;t see where anyone tried calling. WCB

## 2014-08-02 NOTE — Telephone Encounter (Signed)
[email or web address] x2 

## 2014-08-22 ENCOUNTER — Encounter: Payer: Self-pay | Admitting: Emergency Medicine

## 2014-08-22 ENCOUNTER — Ambulatory Visit (INDEPENDENT_AMBULATORY_CARE_PROVIDER_SITE_OTHER): Payer: 59 | Admitting: Emergency Medicine

## 2014-08-22 VITALS — BP 110/74 | HR 60 | Ht 67.0 in | Wt 231.0 lb

## 2014-08-22 DIAGNOSIS — R9389 Abnormal findings on diagnostic imaging of other specified body structures: Secondary | ICD-10-CM

## 2014-08-22 DIAGNOSIS — R911 Solitary pulmonary nodule: Secondary | ICD-10-CM

## 2014-08-22 DIAGNOSIS — R938 Abnormal findings on diagnostic imaging of other specified body structures: Secondary | ICD-10-CM

## 2014-08-22 NOTE — Progress Notes (Signed)
Subjective:    Patient ID: Brenda Williams, female    DOB: March 18, 1981, 34 y.o.   MRN: [id number]  HPI 34 yo woman, never smoker, little PMH. Referred for abnormal CT scan characterized by cystic changes, a single stable RML nodule. Original CT was in 3/'14 after she had anesthesia trouble following d&c. Denies dyspnea except when lying supine. No CP, no cough.   ROV 08/05/13 -- pt follows up for cystic changes and a RML nodule on CT scan chest. She had PFT today that show no AFL but some fatigability, decreased lung volumes and low DLCO that corrects for Va.  Her a1-AT is 110 (low normal) but genotype not reported. She was in the ED yesterday 1/22 for extreme fatigue, some mild SOB, no constitutional sx. She has otherwise been well. No significant cough, sometimes at night.   Acute OV 02/13/14 --  Complains of prod cough with yellow/clear mucus, head congestion w/ yellow mucus, PND, increased SOB, wheezing, fever up to 102 x 6 weeks.  Fevers have resolved. Cough is resolving. Still has some lingering clear mucus.  Reports symptoms are now improved x1-2weeks.   Cough was worse at night, keeping her up at night. That has improved.  Denies any hemoptysis, tightness, leg swelling, chest pain, n/v/d. No abx use.  Went to ER initially on 12/22/13 dx w/ viral syndrome. CXR neg.  Has known pulmonary nodule with planned CT chest 06/2014.   ROV 08/22/14 -- follow-up visit for a right middle lobe pulmonary nodule on CT scan of the chest as well as some mild evidence for emphysematous change versus cystic change on imaging. Spirometry has not confirmed any obstructive disease but she has had short windedness. A repeat CT scan on 06/30/14 showed that her 7 mm right middle lobe nodule is stable in size. She has been exercising more, has been working out more, some ups and downs    Review of Systems As per HPI     Objective:   Physical Exam  Filed Vitals:   08/22/14 1208  BP: 110/74  Pulse: 60  Height:  5\' 7"  (1.702 m)  Weight: 231 lb (104.781 kg)  SpO2: 98%    GEN: A/Ox3; pleasant , NAD  HEENT:  OP clear, no lesions  NECK:  No bruit, no stridor, no lymphadenopathy  RESP  Clear bilaterally with no wheezes, crackles  CARD:  RRR, no m/r/g  , no peripheral edema, pulses intact, no cyanosis or clubbing.  Neuro: alert, no focal deficits noted.      06/30/14 --  COMPARISON: Multiple exams, including 09/14/2012  FINDINGS: Prevascular lymph node short axis diameter 0.9 cm on image 11 of series 2, within normal size limits. No pathologic thoracic adenopathy observed.  Centrilobular emphysema is unusually advanced for age.  The right middle lobe pulmonary nodule measures 0.8 by 0.6 cm by 0.6 by my measurements, and on 09/14/2013 measured 0.8 by 0.6 by 0.5 cm. This difference is minimal and likely ascribed global to miniscule scan differences; I do not appreciate that this lesion has significantly enlarged.  3 mm calcified granuloma, left lower lobe.  IMPRESSION: 1. Twenty-one month of stability of the 7 mm right middle lobe pulmonary nodule. I recommend one final followup chest CT in 6 months time in order to ensure continued stability       Assessment & Plan:  Solitary pulmonary nodule Right middle lobe nodule is unchanged on CT scan. She needs one more CT scan in June 2016 to ensure 2  years of stability.    Abnormal CT scan, chest She also has areas that could be consistent with microcystic disease versus early emphysematous change versus air trapping. Her pulmonary function testing does not support obstructive disease. Her alpha-1 antitrypsin level is low normal. I believe she needs to have serial pulmonary function testing to ensure that she is not developing obstructive disease. We will also follow her alpha-1 antitrypsin level. We'll plan to repeat her PFT in a year

## 2014-08-22 NOTE — Patient Instructions (Signed)
Will arrange for a repeat CT scan of your chest in June 2016 We will arrange for pulmonary function testing in January 2017 Please call our office if your breathing changes in any way Follow with Dr. Lamonte Sakai in June 2016 to review your CT scan results or sooner if you have any problems

## 2014-08-22 NOTE — Assessment & Plan Note (Signed)
She also has areas that could be consistent with microcystic disease versus early emphysematous change versus air trapping. Her pulmonary function testing does not support obstructive disease. Her alpha-1 antitrypsin level is low normal. I believe she needs to have serial pulmonary function testing to ensure that she is not developing obstructive disease. We will also follow her alpha-1 antitrypsin level. We'll plan to repeat her PFT in a year

## 2014-08-22 NOTE — Assessment & Plan Note (Signed)
Right middle lobe nodule is unchanged on CT scan. She needs one more CT scan in June 2016 to ensure 2 years of stability.

## 2014-09-18 ENCOUNTER — Other Ambulatory Visit: Payer: Self-pay | Admitting: Internal Medicine

## 2014-11-30 ENCOUNTER — Telehealth: Payer: Self-pay | Admitting: *Deleted

## 2014-11-30 ENCOUNTER — Encounter: Payer: Self-pay | Admitting: *Deleted

## 2014-11-30 ENCOUNTER — Telehealth: Payer: Self-pay | Admitting: Emergency Medicine

## 2014-11-30 NOTE — Telephone Encounter (Signed)
Unable to reach patient at time of Pre-Visit Call.  Left message for patient to return call when available.    

## 2014-11-30 NOTE — Telephone Encounter (Signed)
Pre-Visit Call completed with patient and chart updated.   Pre-Visit Info documented in Specialty Comments under [facility].    

## 2014-11-30 NOTE — Telephone Encounter (Signed)
Spoke with the pt  She states returning a call to someone but unsure who called  PCC's did you call her? Please advise thanks

## 2014-12-01 ENCOUNTER — Encounter: Payer: Self-pay | Admitting: Physician Assistant

## 2014-12-01 ENCOUNTER — Ambulatory Visit (INDEPENDENT_AMBULATORY_CARE_PROVIDER_SITE_OTHER): Payer: 59 | Admitting: Physician Assistant

## 2014-12-01 VITALS — BP 124/84 | HR 78 | Temp 98.0°F | Ht 67.5 in | Wt 231.6 lb

## 2014-12-01 DIAGNOSIS — R5382 Chronic fatigue, unspecified: Secondary | ICD-10-CM | POA: Diagnosis not present

## 2014-12-01 LAB — COMPREHENSIVE METABOLIC PANEL
ALK PHOS: 52 U/L (ref 39–117)
ALT: 11 U/L (ref 0–35)
AST: 10 U/L (ref 0–37)
Albumin: 4.2 g/dL (ref 3.5–5.2)
BUN: 12 mg/dL (ref 6–23)
CHLORIDE: 105 meq/L (ref 96–112)
CO2: 25 mEq/L (ref 19–32)
Calcium: 9.8 mg/dL (ref 8.4–10.5)
Creatinine, Ser: 0.71 mg/dL (ref 0.40–1.20)
GFR: 120.14 mL/min (ref >60.00)
GLUCOSE: 105 mg/dL — AB (ref 70–99)
Potassium: 4.5 mEq/L (ref 3.5–5.1)
SODIUM: 137 meq/L (ref 135–145)
Total Bilirubin: 0.4 mg/dL (ref 0.2–1.2)
Total Protein: 7.5 g/dL (ref 6.0–8.3)

## 2014-12-01 LAB — CBC WITH DIFFERENTIAL/PLATELET
BASOS ABS: 0 10*3/uL (ref 0.0–0.1)
BASOS PCT: 0.5 % (ref 0.0–3.0)
Eosinophils Absolute: 0 10*3/uL (ref 0.0–0.7)
Eosinophils Relative: 0.6 % (ref 0.0–5.0)
HEMATOCRIT: 41.8 % (ref 36.0–46.0)
Hemoglobin: 14.1 g/dL (ref 12.0–15.0)
LYMPHS PCT: 36.8 % (ref 12.0–46.0)
Lymphs Abs: 2 10*3/uL (ref 0.7–4.0)
MCHC: 33.7 g/dL (ref 30.0–36.0)
MCV: 87.9 fl (ref 78.0–100.0)
Monocytes Absolute: 0.3 10*3/uL (ref 0.1–1.0)
Monocytes Relative: 4.7 % (ref 3.0–12.0)
Neutro Abs: 3.1 10*3/uL (ref 1.4–7.7)
Neutrophils Relative %: 57.4 % (ref 43.0–77.0)
PLATELETS: 303 10*3/uL (ref 150.0–400.0)
RBC: 4.76 Mil/uL (ref 3.87–5.11)
RDW: 14.9 % (ref 11.5–15.5)
WBC: 5.5 10*3/uL (ref 4.0–10.5)

## 2014-12-01 LAB — T4, FREE: Free T4: 0.72 ng/dL (ref 0.60–1.60)

## 2014-12-01 LAB — TSH: TSH: 0.84 u[IU]/mL (ref 0.35–4.50)

## 2014-12-01 NOTE — Progress Notes (Signed)
   Patient presents to clinic today to transfer care from our [place] office.    Patient has noticed significant drop in energy over the past month.  Endorses some mild SOB with exertion.  Denies chest pain, palpitations, lightheadedness or dizziness.  Denies history of anemia. Endorses family history of thyroid disorders.   Endorses constipation.  Denies depressed mood or anhedonia.  Denies anxiety. Endorses well-balanced diet but stays well hydrated.     Past Medical History  Diagnosis Date  . Herpes     never had outbreak. pos per blood, doesn't know which type  . Polycystic ovarian syndrome   . Abnormal Pap smear 2006    leep  . History of chicken pox   . SVD (spontaneous vaginal delivery)     x 1    No current outpatient prescriptions on file prior to visit.   No current facility-administered medications on file prior to visit.    No Known Allergies  Family History  Problem Relation Age of Onset  . Diabetes Maternal Grandmother   . Heart disease Maternal Grandmother   . Hypertension Maternal Grandfather   . Cancer Maternal Grandfather 70    bone, colon   . Diabetes Maternal Grandfather   . Diabetes Mother   . Cancer Mother 57    breast cancer  . Anesthesia problems Neg Hx   . Hypertension Father     History   Social History  . Marital Status: Married    Spouse Name: N/A  . Number of Children: N/A  . Years of Education: N/A   Social History Main Topics  . Smoking status: Never Smoker   . Smokeless tobacco: Never Used  . Alcohol Use: No  . Drug Use: No  . Sexual Activity: Yes    Birth Control/ Protection: None   Other Topics Concern  . None   Social History Narrative   Review of Systems - See HPI.  All other ROS are negative.  BP 124/84 mmHg  Pulse 78  Temp(Src) 98 F (36.7 C) (Oral)  Ht 5' 7.5" (1.715 m)  Wt 231 lb 9.6 oz (105.053 kg)  BMI 35.72 kg/m2  SpO2 97%  LMP 10/19/2014  Breastfeeding? Unknown  Physical Exam  Constitutional: She  is oriented to person, place, and time and well-developed, well-nourished, and in no distress.  HENT:  Head: Normocephalic and atraumatic.  Right Ear: External ear normal.  Left Ear: External ear normal.  Nose: Nose normal.  Mouth/Throat: Oropharynx is clear and moist. No oropharyngeal exudate.  TM within normal limits bilaterally.  Eyes: Conjunctivae are normal. Pupils are equal, round, and reactive to light.  Neck: Neck supple. No thyromegaly present.  Cardiovascular: Normal rate, regular rhythm, normal heart sounds and intact distal pulses.   Pulmonary/Chest: Effort normal and breath sounds normal. No respiratory distress. She has no wheezes. She has no rales. She exhibits no tenderness.  Lymphadenopathy:    She has no cervical adenopathy.  Neurological: She is alert and oriented to person, place, and time.  Skin: Skin is warm and dry. No rash noted.  Psychiatric: Affect normal.  Vitals reviewed.  Assessment/Plan: Chronic fatigue Will check lab panel to include CBC, CMP, Vitamin D, TSH and T4 to further assess.  Supportive measures reviewed.

## 2014-12-01 NOTE — Assessment & Plan Note (Signed)
Will check lab panel to include CBC, CMP, Vitamin D, TSH and T4 to further assess.  Supportive measures reviewed.

## 2014-12-01 NOTE — Patient Instructions (Signed)
Please go to the lab for blood work. I will call you with your results. Please stay well hydrated and keep active as exercise does help with energy levels.   Follow-up will be based on lab results.  Fatigue Fatigue is a feeling of tiredness, lack of energy, lack of motivation, or feeling tired all the time. Having enough rest, good nutrition, and reducing stress will normally reduce fatigue. Consult your caregiver if it persists. The nature of your fatigue will help your caregiver to find out its cause. The treatment is based on the cause.  CAUSES  There are many causes for fatigue. Most of the time, fatigue can be traced to one or more of your habits or routines. Most causes fit into one or more of three general areas. They are: Lifestyle problems  Sleep disturbances.  Overwork.  Physical exertion.  Unhealthy habits.  Poor eating habits or eating disorders.  Alcohol and/or drug use .  Lack of proper nutrition (malnutrition). Psychological problems  Stress and/or anxiety problems.  Depression.  Grief.  Boredom. Medical Problems or Conditions  Anemia.  Pregnancy.  Thyroid gland problems.  Recovery from major surgery.  Continuous pain.  Emphysema or asthma that is not well controlled  Allergic conditions.  Diabetes.  Infections (such as mononucleosis).  Obesity.  Sleep disorders, such as sleep apnea.  Heart failure or other heart-related problems.  Cancer.  Kidney disease.  Liver disease.  Effects of certain medicines such as antihistamines, cough and cold remedies, prescription pain medicines, heart and blood pressure medicines, drugs used for treatment of cancer, and some antidepressants. SYMPTOMS  The symptoms of fatigue include:   Lack of energy.  Lack of drive (motivation).  Drowsiness.  Feeling of indifference to the surroundings. DIAGNOSIS  The details of how you feel help guide your caregiver in finding out what is causing the  fatigue. You will be asked about your present and past health condition. It is important to review all medicines that you take, including prescription and non-prescription items. A thorough exam will be done. You will be questioned about your feelings, habits, and normal lifestyle. Your caregiver may suggest blood tests, urine tests, or other tests to look for common medical causes of fatigue.  TREATMENT  Fatigue is treated by correcting the underlying cause. For example, if you have continuous pain or depression, treating these causes will improve how you feel. Similarly, adjusting the dose of certain medicines will help in reducing fatigue.  HOME CARE INSTRUCTIONS   Try to get the required amount of good sleep every night.  Eat a healthy and nutritious diet, and drink enough water throughout the day.  Practice ways of relaxing (including yoga or meditation).  Exercise regularly.  Make plans to change situations that cause stress. Act on those plans so that stresses decrease over time. Keep your work and personal routine reasonable.  Avoid street drugs and minimize use of alcohol.  Start taking a daily multivitamin after consulting your caregiver. SEEK MEDICAL CARE IF:   You have persistent tiredness, which cannot be accounted for.  You have fever.  You have unintentional weight loss.  You have headaches.  You have disturbed sleep throughout the night.  You are feeling sad.  You have constipation.  You have dry skin.  You have gained weight.  You are taking any new or different medicines that you suspect are causing fatigue.  You are unable to sleep at night.  You develop any unusual swelling of your legs or other  parts of your body. SEEK IMMEDIATE MEDICAL CARE IF:   You are feeling confused.  Your vision is blurred.  You feel faint or pass out.  You develop severe headache.  You develop severe abdominal, pelvic, or back pain.  You develop chest pain, shortness  of breath, or an irregular or fast heartbeat.  You are unable to pass a normal amount of urine.  You develop abnormal bleeding such as bleeding from the rectum or you vomit blood.  You have thoughts about harming yourself or committing suicide.  You are worried that you might harm someone else. MAKE SURE YOU:   Understand these instructions.  Will watch your condition.  Will get help right away if you are not doing well or get worse. Document Released: 04/27/2007 Document Revised: 09/22/2011 Document Reviewed: 11/01/2013 [facility] Patient Information 2015 [place], [place]. This information is not intended to replace advice given to you by your health care provider. Make sure you discuss any questions you have with your health care provider.

## 2014-12-04 ENCOUNTER — Telehealth: Payer: Self-pay | Admitting: Internal Medicine

## 2014-12-04 NOTE — Telephone Encounter (Signed)
Relation to pt: self  Call back number: [phone]   Reason for call:  Pt inquiring about lab results, viewed on my chart would like to know the next step and discuss medication.

## 2014-12-05 ENCOUNTER — Encounter: Payer: Self-pay | Admitting: Physician Assistant

## 2014-12-05 ENCOUNTER — Other Ambulatory Visit (INDEPENDENT_AMBULATORY_CARE_PROVIDER_SITE_OTHER): Payer: 59

## 2014-12-05 DIAGNOSIS — R5382 Chronic fatigue, unspecified: Secondary | ICD-10-CM

## 2014-12-05 DIAGNOSIS — G9332 Myalgic encephalomyelitis/chronic fatigue syndrome: Secondary | ICD-10-CM

## 2014-12-05 LAB — VITAMIN D 25 HYDROXY (VIT D DEFICIENCY, FRACTURES): VITD: 21.31 ng/mL — ABNORMAL LOW (ref 30.00–100.00)

## 2014-12-05 NOTE — Telephone Encounter (Signed)
Spoke with pt and advised that we had checked with PCC's and we have no record of calling her.  She states that it was actually another office that has called her and nothing further is needed.

## 2014-12-06 NOTE — Telephone Encounter (Signed)
Spoke with the pt and informed her that her Vit. D results was sent to her via [place].  Informed her that she may response to the results by MyChart.  Pt verbalized understanding and agreed.//AB/CMA

## 2014-12-07 MED ORDER — CHOLECALCIFEROL 50 MCG (2000 UT) PO TABS: 1.0000 | ORAL_TABLET | Freq: Every day | ORAL | Status: DC

## 2014-12-08 ENCOUNTER — Other Ambulatory Visit: Payer: Self-pay | Admitting: Physician Assistant

## 2014-12-08 MED ORDER — PHENTERMINE HCL 30 MG PO CAPS: 30.0000 mg | ORAL_CAPSULE | ORAL | Status: DC

## 2014-12-26 ENCOUNTER — Other Ambulatory Visit: Payer: 59

## 2014-12-26 ENCOUNTER — Ambulatory Visit (HOSPITAL_BASED_OUTPATIENT_CLINIC_OR_DEPARTMENT_OTHER)
Admission: RE | Admit: 2014-12-26 | Discharge: 2014-12-26 | Disposition: A | Discharge status: Home / Self Care | Payer: 59 | Source: Ambulatory Visit | Attending: Emergency Medicine | Admitting: Emergency Medicine

## 2014-12-26 DIAGNOSIS — R911 Solitary pulmonary nodule: Secondary | ICD-10-CM | POA: Insufficient documentation

## 2014-12-27 ENCOUNTER — Telehealth: Payer: Self-pay | Admitting: Emergency Medicine

## 2014-12-27 NOTE — Telephone Encounter (Signed)
Pt is requesting results of CT from 12/26/14. Please advise RB. Thanks.

## 2014-12-28 NOTE — Telephone Encounter (Signed)
[phone], pt called to check on status of results

## 2014-12-28 NOTE — Telephone Encounter (Signed)
discussed results w pt. The nodule slightly increased in size compared with 2014. It looks about the same to me as in December 2015.  Given its size I don't think PET scan will be very revealing. I would like to repeat her scan in 6 months to look for interval change. If our suspicion increases then we will discuss possible biopsy. The patient understood the results and our plans

## 2015-01-10 ENCOUNTER — Encounter: Payer: Self-pay | Admitting: Physician Assistant

## 2015-01-10 ENCOUNTER — Ambulatory Visit (INDEPENDENT_AMBULATORY_CARE_PROVIDER_SITE_OTHER): Payer: 59 | Admitting: Physician Assistant

## 2015-01-10 VITALS — BP 118/82 | HR 74 | Temp 98.3°F | Resp 12 | Ht 67.5 in | Wt 227.0 lb

## 2015-01-10 DIAGNOSIS — E669 Obesity, unspecified: Secondary | ICD-10-CM | POA: Diagnosis not present

## 2015-01-10 MED ORDER — PHENTERMINE HCL 30 MG PO CAPS: 30.0000 mg | ORAL_CAPSULE | ORAL | Status: DC

## 2015-01-10 NOTE — Patient Instructions (Signed)
Please continue medications as directed. We will continue the Phentermine for 2 more months. Continue your diet and exercise regimen.

## 2015-01-12 DIAGNOSIS — E669 Obesity, unspecified: Secondary | ICD-10-CM | POA: Insufficient documentation

## 2015-01-12 NOTE — Assessment & Plan Note (Signed)
Wt Readings from Last 3 Encounters:  01/10/15 227 lb (102.967 kg)  12/01/14 231 lb 9.6 oz (105.053 kg)  08/22/14 231 lb (104.781 kg)   Weight loss noted without side effects. Vitals Stable. Will continue phentermine for 2 more months. Continue diet and exercise regimen. Follow-up 2 months.

## 2015-01-12 NOTE — Progress Notes (Signed)
Patient presents to clinic today for follow-up for obesity after starting phentermine to supplement diet and exercise regimen.  Patient endorses taking as directed. Denies insomnia, jitteriness or racing heart with medication. Notes 5 pound weight loss since starting medication.  Is working on eating smaller portions more frequently for metabolism.  Is continuing to exercise several times throughout week.  Past Medical History  Diagnosis Date  . Herpes     never had outbreak. pos per blood, doesn't know which type  . Polycystic ovarian syndrome   . Abnormal Pap smear 2006    leep  . History of chicken pox   . SVD (spontaneous vaginal delivery)     x 1    Current Outpatient Prescriptions on File Prior to Visit  Medication Sig Dispense Refill  . Cholecalciferol 2000 UNITS TABS Take 1 tablet (2,000 Units total) by mouth daily. 30 each 3   No current facility-administered medications on file prior to visit.    No Known Allergies  Family History  Problem Relation Age of Onset  . Diabetes Maternal Grandmother   . Heart disease Maternal Grandmother   . Hypertension Maternal Grandfather   . Cancer Maternal Grandfather 70    bone, colon   . Diabetes Maternal Grandfather   . Diabetes Mother   . Cancer Mother 85    breast cancer  . Anesthesia problems Neg Hx   . Hypertension Father     History   Social History  . Marital Status: Married    Spouse Name: N/A  . Number of Children: N/A  . Years of Education: N/A   Social History Main Topics  . Smoking status: Never Smoker   . Smokeless tobacco: Never Used  . Alcohol Use: No  . Drug Use: No  . Sexual Activity: Yes    Birth Control/ Protection: None   Other Topics Concern  . None   Social History Narrative    Review of Systems - See HPI.  All other ROS are negative.  BP 118/82 mmHg  Pulse 74  Temp(Src) 98.3 F (36.8 C) (Oral)  Resp 12  Ht 5' 7.5" (1.715 m)  Wt 227 lb (102.967 kg)  BMI 35.01 kg/m2  SpO2 98%   LMP 06/09/2014  Physical Exam  Constitutional: She is oriented to person, place, and time and well-developed, well-nourished, and in no distress.  HENT:  Head: Normocephalic and atraumatic.  Eyes: Conjunctivae are normal.  Cardiovascular: Normal rate, regular rhythm, normal heart sounds and intact distal pulses.   Pulmonary/Chest: Effort normal and breath sounds normal. No respiratory distress. She has no wheezes. She has no rales. She exhibits no tenderness.  Neurological: She is alert and oriented to person, place, and time.  Skin: Skin is warm and dry. No rash noted.  Psychiatric: Affect normal.  Vitals reviewed.   Recent Results (from the past 2160 hour(s))  CBC w/Diff     Status: None   Collection Time: 12/01/14  8:07 AM  Result Value Ref Range   WBC 5.5 4.0 - 10.5 K/uL   RBC 4.76 3.87 - 5.11 Mil/uL   Hemoglobin 14.1 12.0 - 15.0 g/dL   HCT 41.8 36.0 - 46.0 %   MCV 87.9 78.0 - 100.0 fl   MCHC 33.7 30.0 - 36.0 g/dL   RDW 14.9 11.5 - 15.5 %   Platelets 303.0 150.0 - 400.0 K/uL   Neutrophils Relative % 57.4 43.0 - 77.0 %   Lymphocytes Relative 36.8 12.0 - 46.0 %   Monocytes  Relative 4.7 3.0 - 12.0 %   Eosinophils Relative 0.6 0.0 - 5.0 %   Basophils Relative 0.5 0.0 - 3.0 %   Neutro Abs 3.1 1.4 - 7.7 K/uL   Lymphs Abs 2.0 0.7 - 4.0 K/uL   Monocytes Absolute 0.3 0.1 - 1.0 K/uL   Eosinophils Absolute 0.0 0.0 - 0.7 K/uL   Basophils Absolute 0.0 0.0 - 0.1 K/uL  Comp Met (CMET)     Status: Abnormal   Collection Time: 12/01/14  8:07 AM  Result Value Ref Range   Sodium 137 135 - 145 mEq/L   Potassium 4.5 3.5 - 5.1 mEq/L   Chloride 105 96 - 112 mEq/L   CO2 25 19 - 32 mEq/L   Glucose, Bld 105 (H) 70 - 99 mg/dL   BUN 12 6 - 23 mg/dL   Creatinine, Ser 0.71 0.40 - 1.20 mg/dL   Total Bilirubin 0.4 0.2 - 1.2 mg/dL   Alkaline Phosphatase 52 39 - 117 U/L   AST 10 0 - 37 U/L   ALT 11 0 - 35 U/L   Total Protein 7.5 6.0 - 8.3 g/dL   Albumin 4.2 3.5 - 5.2 g/dL   Calcium 9.8 8.4 -  10.5 mg/dL   GFR 120.14 >60.00 mL/min  TSH     Status: None   Collection Time: 12/01/14  8:07 AM  Result Value Ref Range   TSH 0.84 0.35 - 4.50 uIU/mL  T4, free     Status: None   Collection Time: 12/01/14  8:07 AM  Result Value Ref Range   Free T4 0.72 0.60 - 1.60 ng/dL  Vitamin D 25 hydroxy     Status: Abnormal   Collection Time: 12/05/14  3:26 PM  Result Value Ref Range   VITD 21.31 (L) 30.00 - 100.00 ng/mL    Assessment/Plan: Obesity Wt Readings from Last 3 Encounters:  01/10/15 227 lb (102.967 kg)  12/01/14 231 lb 9.6 oz (105.053 kg)  08/22/14 231 lb (104.781 kg)   Weight loss noted without side effects. Vitals Stable. Will continue phentermine for 2 more months. Continue diet and exercise regimen. Follow-up 2 months.

## 2015-01-12 NOTE — Progress Notes (Signed)
Pre visit review using our clinic review tool, if applicable. No additional management support is needed unless otherwise documented below in the visit note. 

## 2015-02-26 ENCOUNTER — Encounter: Payer: Self-pay | Admitting: Emergency Medicine

## 2015-02-26 ENCOUNTER — Other Ambulatory Visit: Payer: 59

## 2015-02-26 ENCOUNTER — Ambulatory Visit (INDEPENDENT_AMBULATORY_CARE_PROVIDER_SITE_OTHER): Payer: 59 | Admitting: Emergency Medicine

## 2015-02-26 VITALS — BP 104/66 | HR 80 | Ht 67.5 in | Wt 223.0 lb

## 2015-02-26 DIAGNOSIS — R9389 Abnormal findings on diagnostic imaging of other specified body structures: Secondary | ICD-10-CM

## 2015-02-26 DIAGNOSIS — J439 Emphysema, unspecified: Secondary | ICD-10-CM

## 2015-02-26 DIAGNOSIS — R911 Solitary pulmonary nodule: Secondary | ICD-10-CM

## 2015-02-26 DIAGNOSIS — R938 Abnormal findings on diagnostic imaging of other specified body structures: Secondary | ICD-10-CM | POA: Diagnosis not present

## 2015-02-26 NOTE — Patient Instructions (Signed)
We will perform blood work (alpha-1 antitrypsin testing) today We will schedule repeat pulmonary function testing to compare with prior Your CT scan of the chest shows no change in your right middle lobe pulmonary nodule. This is good news. Follow with Dr Lamonte Sakai in 6 months or sooner if you have any problems

## 2015-02-26 NOTE — Assessment & Plan Note (Signed)
7 mm right middle lobe nodule has been stable on CT scan since 09/2012. I do not believe she needs any further serial scans to follow this lesion.

## 2015-02-26 NOTE — Progress Notes (Signed)
Subjective:    Patient ID: Brenda Williams, female    DOB: May 16, 1981, 34 y.o.   MRN: [id number]  HPI 34 yo woman, never smoker, little PMH. Referred for abnormal CT scan characterized by cystic changes, a single stable RML nodule. Original CT was in 3/'14 after she had anesthesia trouble following d&c. Denies dyspnea except when lying supine. No CP, no cough.   ROV 08/05/13 -- pt follows up for cystic changes and a RML nodule on CT scan chest. She had PFT today that show no AFL but some fatigability, decreased lung volumes and low DLCO that corrects for Va.  Her a1-AT is 110 (low normal) but genotype not reported. She was in the ED yesterday 1/22 for extreme fatigue, some mild SOB, no constitutional sx. She has otherwise been well. No significant cough, sometimes at night.   Acute OV 02/13/14 --  Complains of prod cough with yellow/clear mucus, head congestion w/ yellow mucus, PND, increased SOB, wheezing, fever up to 102 x 6 weeks.  Fevers have resolved. Cough is resolving. Still has some lingering clear mucus.  Reports symptoms are now improved x1-2weeks.   Cough was worse at night, keeping her up at night. That has improved.  Denies any hemoptysis, tightness, leg swelling, chest pain, n/v/d. No abx use.  Went to ER initially on 12/22/13 dx w/ viral syndrome. CXR neg.  Has known pulmonary nodule with planned CT chest 06/2014.   ROV 08/22/14 -- follow-up visit for a right middle lobe pulmonary nodule on CT scan of the chest as well as some mild evidence for emphysematous change versus cystic change on imaging. Spirometry has not confirmed any obstructive disease but she has had short windedness. A repeat CT scan on 06/30/14 showed that her 7 mm right middle lobe nodule is stable in size. She has been exercising more, has been working out more, some ups and downs   ROV 02/26/15 -- follow-up visit for lobe pulmonary nodule and some microcystic disease versus early emphysematous change on her CT scan  of the chest. Pulmonary function testing does not show overt obstruction. She reports that she has been doing fairly well, had a URI a couple weeks ago. She has some nocturnal cough, especially when she sleeps on her R side. She denies any GERD or PND. She had repeat CT scan of the chest performed on  12/26/14 that I personally reviewed. This showed that her right middle lobe pulmonary nodule is stable in size. It has now been unchanged for 2 years and 3 months. She should not need any repeat scans to follow this. She does still have innumerable small cystic lesions that are suggestive of early emphysematous change. She denies any significant dyspnea   Review of Systems As per HPI     Objective:   Physical Exam  Filed Vitals:   02/26/15 1640  BP: 104/66  Pulse: 80  Height: 5' 7.5" (1.715 m)  Weight: 223 lb (101.152 kg)  SpO2: 98%    GEN: A/Ox3; pleasant , NAD  HEENT:  OP clear, no lesions  NECK:  No bruit, no stridor, no lymphadenopathy  RESP  Clear bilaterally with no wheezes, crackles  CARD:  RRR, no m/r/g  , no peripheral edema, pulses intact, no cyanosis or clubbing.  Neuro: alert, no focal deficits noted.      12/26/14 COMPARISON: 06/30/2014; 09/14/2012  FINDINGS: Mediastinum/Nodes: Unremarkable  Lungs/Pleura: Prominent centrilobular emphysema, unusually advanced for age.  Calcified granuloma in the left lower lobe. Right middle  lobe nodule measures 1.0 by 0.6 cm, and approximately 7.5 mm vertically. This represents a mild increased from 09/14/2012.  Upper abdomen: Unremarkable  Musculoskeletal: Unremarkable  IMPRESSION: 1. The right middle lobe nodule has mildly increased in size since 09/14/2012. However, this increase in size is thought to be real. The possibility of a low-grade malignancy is difficult to exclude. The nodule it is at the borderline size for sensitive PET-CT assessment, and may be slightly too small to readily percutaneously biopsy.  Follow up might include PET-CT, short term interval CT observation, or presumptive wedge resection. 2. The [facility] [facility] record indicates that the patient has never smoked. If that is truly the case then the underlying emphysema could be caused by alpha 1 antitrypsin deficiency.       Assessment & Plan:  Solitary pulmonary nodule 7 mm right middle lobe nodule has been stable on CT scan since 09/2012. I do not believe she needs any further serial scans to follow this lesion.   Abnormal CT scan, chest Her CT scan also shows innumerable small sub-centimeter cystic lesions that are thin-walled and looked most consistent with early emphysematous change. I believe she needs repeat pulmonary function testing and a repeat alpha-1 antitrypsin level. It's not clear to me that her alpha-1 antitrypsin genotype has been done and we will perform this as well.

## 2015-02-26 NOTE — Assessment & Plan Note (Signed)
Her CT scan also shows innumerable small sub-centimeter cystic lesions that are thin-walled and looked most consistent with early emphysematous change. I believe she needs repeat pulmonary function testing and a repeat alpha-1 antitrypsin level. It's not clear to me that her alpha-1 antitrypsin genotype has been done and we will perform this as well.

## 2015-03-01 LAB — ALPHA-1 ANTITRYPSIN PHENOTYPE: A1 ANTITRYPSIN: 124 mg/dL (ref 83–199)

## 2015-03-13 ENCOUNTER — Other Ambulatory Visit: Payer: Self-pay | Admitting: Emergency Medicine

## 2015-03-13 DIAGNOSIS — R911 Solitary pulmonary nodule: Secondary | ICD-10-CM

## 2015-04-13 ENCOUNTER — Encounter: Payer: Self-pay | Admitting: Internal Medicine

## 2015-04-13 ENCOUNTER — Ambulatory Visit (INDEPENDENT_AMBULATORY_CARE_PROVIDER_SITE_OTHER): Payer: 59 | Admitting: Internal Medicine

## 2015-04-13 ENCOUNTER — Ambulatory Visit (HOSPITAL_BASED_OUTPATIENT_CLINIC_OR_DEPARTMENT_OTHER)
Admission: RE | Admit: 2015-04-13 | Discharge: 2015-04-13 | Disposition: A | Discharge status: Home / Self Care | Payer: 59 | Source: Ambulatory Visit | Attending: Internal Medicine | Admitting: Internal Medicine

## 2015-04-13 VITALS — BP 122/64 | HR 65 | Temp 97.9°F | Ht 67.5 in | Wt 221.0 lb

## 2015-04-13 DIAGNOSIS — M25461 Effusion, right knee: Secondary | ICD-10-CM | POA: Diagnosis not present

## 2015-04-13 DIAGNOSIS — M25561 Pain in right knee: Secondary | ICD-10-CM | POA: Diagnosis present

## 2015-04-13 DIAGNOSIS — M179 Osteoarthritis of knee, unspecified: Secondary | ICD-10-CM | POA: Insufficient documentation

## 2015-04-13 DIAGNOSIS — S8991XA Unspecified injury of right lower leg, initial encounter: Secondary | ICD-10-CM | POA: Diagnosis not present

## 2015-04-13 NOTE — Progress Notes (Signed)
Pre visit review using our clinic review tool, if applicable. No additional management support is needed unless otherwise documented below in the visit note. 

## 2015-04-13 NOTE — Progress Notes (Signed)
   Subjective:    Patient ID: Brenda Williams, female    DOB: Aug 21, 1980, 34 y.o.   MRN: [id number]  DOS:  04/13/2015 Type of visit - description : Acute Interval history: Earlier today, she was at work, she stood up from a chair and immediately felt a crackling sound from the R knee  follow-up but swelling and pain.  She put ice and took ibuprofen, swelling and pain decreased, still have some difficulty walking. History of right knee arthroscopy years ago for apparently a meniscal issue  Review of Systems   Past Medical History  Diagnosis Date  . Herpes     never had outbreak. pos per blood, doesn't know which type  . Polycystic ovarian syndrome   . Abnormal Pap smear 2006    leep  . History of chicken pox   . SVD (spontaneous vaginal delivery)     x 1    Past Surgical History  Procedure Laterality Date  . Knee surgery  1998    right knee  . Leep  2006  . Hysteroscopy  2009    uterine polyp  . Sphincterotomy  2010  . Ivf retrival  2010, 2011  . Uterine polyp removal    . Dilation and evacuation N/A 09/14/2012    Procedure: DILATATION AND EVACUATION;  Surgeon: Allena Katz, MD;  Location: [facility];  Service: Gynecology;  Laterality: N/A;    Social History   Social History  . Marital Status: Married    Spouse Name: N/A  . Number of Children: N/A  . Years of Education: N/A   Occupational History  . Not on file.   Social History Main Topics  . Smoking status: Never Smoker   . Smokeless tobacco: Never Used  . Alcohol Use: No  . Drug Use: No  . Sexual Activity: Yes    Birth Control/ Protection: None   Other Topics Concern  . Not on file   Social History Narrative        Medication List       This list is accurate as of: 04/13/15  6:05 PM.  Always use your most recent med list.               Cholecalciferol 2000 UNITS Tabs  Take 1 tablet (2,000 Units total) by mouth daily.           Objective:   Physical Exam BP 122/64 mmHg  Pulse 65   Temp(Src) 97.9 F (36.6 C) (Oral)  Ht 5' 7.5" (1.715 m)  Wt 221 lb (100.245 kg)  BMI 34.08 kg/m2  SpO2 97%  LMP 05/19/2014  Breastfeeding? No General:   Well developed, well nourished . NAD.  HEENT:  Normocephalic . Face symmetric, atraumatic MSK: Left knee normal Right knee: No deformities, minimal effusion if any, no red, no warm. Full extension normal, limited flexion. Joint is stable. Moderate pain with patellar manipulation. Skin: Not pale. Not jaundice Neurologic:  alert & oriented X3.  Speech normal, gait appropriate for age and unassisted Psych--  Cognition and judgment appear intact.  Cooperative with normal attention span and concentration.  Behavior appropriate. No anxious or depressed appearing.      Assessment & Plan:   Knee injury: Suspect internal derangement. Recommend ice, ibuprofen, x-ray. Recommend to see orthopedic surgery, she can be seen at the urgent care [facility] today. If not will call for a referral

## 2015-04-13 NOTE — Patient Instructions (Addendum)
  Stop by the first floor and get the XR   Take  IBUPROFEN (Advil or Motrin) 200 mg 2 tablets every 6 hours as needed for pain.  Always take it with food because may cause gastritis and ulcers.  If you notice nausea, stomach pain, change in the color of stools --->  Stop the medicine and let us know  Continue ICING  the knee     If unable to go to the urgent care tonight, please call for a referral for next week  [facility] is open from 5:30 pm to 9:00 pm [address] [place], [place] [address] Their number is [phone].

## 2015-04-16 ENCOUNTER — Other Ambulatory Visit (HOSPITAL_BASED_OUTPATIENT_CLINIC_OR_DEPARTMENT_OTHER): Payer: Self-pay | Admitting: Sports Medicine

## 2015-04-16 DIAGNOSIS — M25561 Pain in right knee: Secondary | ICD-10-CM

## 2015-04-21 ENCOUNTER — Ambulatory Visit (HOSPITAL_BASED_OUTPATIENT_CLINIC_OR_DEPARTMENT_OTHER)
Admission: RE | Admit: 2015-04-21 | Discharge: 2015-04-21 | Disposition: A | Discharge status: Home / Self Care | Payer: 59 | Source: Ambulatory Visit | Attending: Sports Medicine | Admitting: Sports Medicine

## 2015-04-21 DIAGNOSIS — M7989 Other specified soft tissue disorders: Secondary | ICD-10-CM | POA: Diagnosis not present

## 2015-04-21 DIAGNOSIS — M25561 Pain in right knee: Secondary | ICD-10-CM | POA: Diagnosis present

## 2015-04-21 DIAGNOSIS — M1990 Unspecified osteoarthritis, unspecified site: Secondary | ICD-10-CM | POA: Insufficient documentation

## 2015-06-28 ENCOUNTER — Ambulatory Visit (HOSPITAL_BASED_OUTPATIENT_CLINIC_OR_DEPARTMENT_OTHER)
Admission: RE | Admit: 2015-06-28 | Discharge: 2015-06-28 | Disposition: A | Discharge status: Home / Self Care | Payer: 59 | Source: Ambulatory Visit | Attending: Emergency Medicine | Admitting: Emergency Medicine

## 2015-06-28 DIAGNOSIS — R918 Other nonspecific abnormal finding of lung field: Secondary | ICD-10-CM | POA: Diagnosis not present

## 2015-06-28 DIAGNOSIS — R911 Solitary pulmonary nodule: Secondary | ICD-10-CM | POA: Insufficient documentation

## 2015-06-29 ENCOUNTER — Telehealth: Payer: Self-pay | Admitting: Emergency Medicine

## 2015-06-29 DIAGNOSIS — R9389 Abnormal findings on diagnostic imaging of other specified body structures: Secondary | ICD-10-CM

## 2015-06-29 NOTE — Telephone Encounter (Signed)
[id number] x1 

## 2015-07-02 NOTE — Telephone Encounter (Signed)
Called and spoke with pt Informed her of CT results and rec of RB Pt voiced understanding of rec and appt scheduled for 09/01/14 per RB request  Nothing further is needed

## 2015-07-02 NOTE — Telephone Encounter (Signed)
Please let her know that the nodule seen on her chest CT scan is stable / unchanged. Also let her know that she still has small cysts in her lungs that I would like to evaluate further.  I ordered a VEGF-d level to be drawn at the [facility] lab. Have her see me in the office next available and we can discuss any further labs or testing to do.

## 2015-07-02 NOTE — Telephone Encounter (Signed)
Pt calling for CT results. Please advise Dr Lamonte Sakai. Thanks.

## 2015-07-04 ENCOUNTER — Other Ambulatory Visit: Payer: 59

## 2015-07-04 DIAGNOSIS — R911 Solitary pulmonary nodule: Secondary | ICD-10-CM

## 2015-07-10 ENCOUNTER — Telehealth: Payer: Self-pay | Admitting: Emergency Medicine

## 2015-07-10 NOTE — Telephone Encounter (Signed)
lmtcb x1 for pt. 

## 2015-07-11 LAB — VEGF, SERUM: VEGF, Serum: 499 pg/mL (ref 62–707)

## 2015-07-11 NOTE — Telephone Encounter (Signed)
Pt calling for Lab results - aware that this was sent off to [place] and we do not have the final results yet. Aware that we will call her as soon as we receive these.  Will send to Dr Lamonte Sakai as Juluis Rainier that pt is calling and we are still waiting on results.   Ria Comment, have you seen in faxed results? Thanks.

## 2015-07-11 NOTE — Telephone Encounter (Signed)
No I have not, sorry.

## 2015-07-11 NOTE — Telephone Encounter (Signed)
Called [facility], states that VEGF lab is still pending, and should be completed by tomorrow as long as there are no problems or repeats needed.  Will hold for results.

## 2015-07-12 NOTE — Telephone Encounter (Signed)
Called [facility], lab has been resulted and results are being faxed to our office.  Will await fax.

## 2015-07-13 NOTE — Telephone Encounter (Signed)
Formed received and placed in RB's look at.  Will send message to [place] for follow up

## 2015-07-19 NOTE — Telephone Encounter (Signed)
Waiting for RB's return to address.

## 2015-07-23 NOTE — Telephone Encounter (Signed)
Per RB >> her VEGF level is normal. We still need to have an OV to meet and discuss her CT chest results and any other procedures/tests she might need.  lmtcb x1 for pt. Pt has pending OV on 09/07/15.

## 2015-07-23 NOTE — Telephone Encounter (Signed)
Spoke with pt, aware of results/recs.  Nothing further needed.  

## 2015-07-23 NOTE — Telephone Encounter (Signed)
Pt calling back for results.Brenda Williams

## 2015-08-15 ENCOUNTER — Emergency Department (HOSPITAL_BASED_OUTPATIENT_CLINIC_OR_DEPARTMENT_OTHER)
Admission: EM | Admit: 2015-08-15 | Discharge: 2015-08-15 | Disposition: A | Discharge status: Home / Self Care | Payer: 59 | Attending: Emergency Medicine | Admitting: Emergency Medicine

## 2015-08-15 ENCOUNTER — Encounter (HOSPITAL_BASED_OUTPATIENT_CLINIC_OR_DEPARTMENT_OTHER): Payer: Self-pay | Admitting: *Deleted

## 2015-08-15 DIAGNOSIS — Z8619 Personal history of other infectious and parasitic diseases: Secondary | ICD-10-CM | POA: Insufficient documentation

## 2015-08-15 DIAGNOSIS — M25562 Pain in left knee: Secondary | ICD-10-CM | POA: Insufficient documentation

## 2015-08-15 DIAGNOSIS — Z8639 Personal history of other endocrine, nutritional and metabolic disease: Secondary | ICD-10-CM | POA: Insufficient documentation

## 2015-08-15 DIAGNOSIS — Z79899 Other long term (current) drug therapy: Secondary | ICD-10-CM | POA: Insufficient documentation

## 2015-08-15 DIAGNOSIS — M545 Low back pain, unspecified: Secondary | ICD-10-CM

## 2015-08-15 HISTORY — DX: Unspecified osteoarthritis, unspecified site: M19.90

## 2015-08-15 NOTE — Discharge Instructions (Signed)

## 2015-08-15 NOTE — ED Provider Notes (Signed)
CSN: [id number]     Arrival date & time 08/15/15  [id number] History   First MD Initiated Contact with Patient 08/15/15 [phone]     Chief Complaint  Patient presents with  . Back Pain  . Knee Pain   HPI   35 year old female presents today with numerous complaints. Patient reports a chronic history of osteoarthritis. Patient reports that she has not followed with anybody for her left knee pain, or back pain. She reports a long-standing history of knee pain, worse with ambulation, upon further questioning reports that she has indeed seen orthopedic specialist for this, who instructed her to take Tylenol. Patient notes that normally her right knee hurts now her left knee hurts. She denies any swelling, warmth to touch, trauma. Patient with full active range of motion.  Patient also reports lower back pain, reports is a deep ache, worse with standing, not worse with palpation. No trauma, no loss of distal sensation strength or motor function.  Past Medical History  Diagnosis Date  . Herpes     never had outbreak. pos per blood, doesn't know which type  . Polycystic ovarian syndrome   . Abnormal Pap smear 2006    leep  . History of chicken pox   . SVD (spontaneous vaginal delivery)     x 1  . Arthritis     osteoarthritis   Past Surgical History  Procedure Laterality Date  . Knee surgery  1998    right knee  . Leep  2006  . Hysteroscopy  2009    uterine polyp  . Sphincterotomy  2010  . Ivf retrival  2010, 2011  . Uterine polyp removal    . Dilation and evacuation N/A 09/14/2012    Procedure: DILATATION AND EVACUATION;  Surgeon: Allena Katz, MD;  Location: [facility];  Service: Gynecology;  Laterality: N/A;   Family History  Problem Relation Age of Onset  . Diabetes Maternal Grandmother   . Heart disease Maternal Grandmother   . Hypertension Maternal Grandfather   . Cancer Maternal Grandfather 70    bone, colon   . Diabetes Maternal Grandfather   . Diabetes Mother   . Cancer Mother 71     breast cancer  . Anesthesia problems Neg Hx   . Hypertension Father    Social History  Substance Use Topics  . Smoking status: Never Smoker   . Smokeless tobacco: Never Used  . Alcohol Use: No   OB History    Gravida Para Term Preterm AB TAB SAB Ectopic Multiple Living   5 1 1  0 2 0 1 1 0 1     Review of Systems  All other systems reviewed and are negative.   Allergies  Review of patient's allergies indicates no known allergies.  Home Medications   Prior to Admission medications   Medication Sig Start Date End Date Taking? Authorizing Provider  Cholecalciferol 2000 UNITS TABS Take 1 tablet (2,000 Units total) by mouth daily. 12/07/14   Brunetta Jeans, PA-C   BP 128/87 mmHg  Pulse 77  Temp(Src) 98.3 F (36.8 C) (Oral)  Resp 16  Ht 5\' 7"  (1.702 m)  Wt 104.327 kg  BMI 36.01 kg/m2  SpO2 98%   Physical Exam  Constitutional: She is oriented to person, place, and time. She appears well-developed and well-nourished. No distress.  HENT:  Head: Normocephalic and atraumatic.  Eyes: Conjunctivae are normal. Pupils are equal, round, and reactive to light. Right eye exhibits no discharge. Left eye exhibits  no discharge. No scleral icterus.  Neck: Normal range of motion. Neck supple. No JVD present. No tracheal deviation present.  Pulmonary/Chest: Effort normal. No stridor.  Musculoskeletal: Normal range of motion. She exhibits tenderness. She exhibits no edema.  No C, T, or L spine tenderness to palpation. No obvious signs of trauma, deformity, infection, step-offs. Lung expansion normal. No scoliosis or kyphosis. Bilateral lower extremity strength 5 out of 5, sensation grossly intact,  Straight leg negative Ambulates without difficulty  Left knee atraumatic, knees equal bilateral, full active range of motion, very minor tenderness to palpation of the lateral aspect. No laxity noted with valgus varus slowed, anterior posterior tubal translation normal, negative patellar  grind   Neurological: She is alert and oriented to person, place, and time. Coordination normal.  Skin: Skin is warm and dry. She is not diaphoretic.  Psychiatric: She has a normal mood and affect. Her behavior is normal. Judgment and thought content normal.  Nursing note and vitals reviewed.   ED Course  Procedures (including critical care time) Labs Review Labs Reviewed - No data to display  Imaging Review No results found. I have personally reviewed and evaluated these images and lab results as part of my medical decision-making.   EKG Interpretation None      MDM   Final diagnoses:  Left knee pain  Bilateral low back pain without sciatica    Labs:  Imaging:  Consults:  Therapeutics:  Discharge Meds:   Assessment/Plan: Patient's presentation is most consistent with osteoarthritis. Patient has a history of chronic pain, she has no acute findings on exam that would necessitate further evaluation or management here in the ED setting. She has no red flags, I havepatient for septic or gouty arthritis of the knee. Patient ambulatory without difficulty. She was instructed follow-up with her orthopedist for reevaluation and further management. She is instructed to use Tylenol as needed for discomfort. No further questions or concerns at the time of discharge.         Okey Regal, PA-C 08/15/15 [id number]  Julianne Rice, MD 08/15/15 [phone]

## 2015-08-15 NOTE — ED Notes (Signed)
Patient states she has a three day history of left knee pain and mid lower back pain, which is causing her problems with walking and standing.  No known injury.  History of osteoarthritis in her knees and is followed by an orthopedic md.

## 2015-08-16 ENCOUNTER — Ambulatory Visit (INDEPENDENT_AMBULATORY_CARE_PROVIDER_SITE_OTHER): Payer: 59 | Admitting: Family Medicine

## 2015-08-16 ENCOUNTER — Encounter: Payer: Self-pay | Admitting: Family Medicine

## 2015-08-16 VITALS — BP 113/77 | HR 80 | Ht 68.0 in | Wt 226.0 lb

## 2015-08-16 DIAGNOSIS — M25562 Pain in left knee: Secondary | ICD-10-CM | POA: Diagnosis not present

## 2015-08-16 DIAGNOSIS — M25561 Pain in right knee: Secondary | ICD-10-CM

## 2015-08-16 MED ORDER — METHYLPREDNISOLONE ACETATE 40 MG/ML IJ SUSP
40.0000 mg | Freq: Once | INTRAMUSCULAR | Status: AC
  Administered 2015-08-16: 40 mg via INTRA_ARTICULAR

## 2015-08-16 NOTE — Patient Instructions (Signed)
Your knee pain (and likely your back at least in part) is due to arthritis. These are the 4 different classes of medicine you can use for this: Tylenol 500mg  1-2 tabs three times a day for pain. Aleve 1-2 tabs twice a day with food Glucosamine sulfate 750mg  twice a day is a supplement that may help. Capsaicin, aspercreme, or biofreeze topically up to four times a day may also help with pain. Cortisone injections are an option - you were given this today in your left knee. If cortisone injections do not help, there are different types of shots that may help but they take longer to take effect. It's important that you continue to stay active. Straight leg raises, knee extensions 3 sets of 10 once a day (add ankle weight if these become too easy). Consider physical therapy to strengthen muscles around the joint that hurts to take pressure off of the joint itself. Shoe inserts with good arch support may be helpful. Walker or cane if needed. Heat or ice 15 minutes at a time 3-4 times a day as needed to help with pain. Water aerobics and cycling with low resistance are the best two types of exercise for arthritis. Follow up with me in 6 weeks or as needed.

## 2015-08-20 DIAGNOSIS — M25561 Pain in right knee: Secondary | ICD-10-CM | POA: Insufficient documentation

## 2015-08-20 NOTE — Progress Notes (Signed)
PCP: Leeanne Rio, PA-C  Subjective:   HPI: Patient is a 35 y.o. female here for bilateral knee pain.  Patient reports having over 1 week of bilateral knee pain, left worse than right. Pain is 0/10 at rest, up to 7/10 with a lot of walking, weight bearing. Pain anterior, dull. Using heat.  Swelling last night. Pain does move around though. Had an MRI of right knee 4 months ago - showed only DJD, chondromalacia. No skin changes, fever, other complaints.  Past Medical History  Diagnosis Date  . Herpes     never had outbreak. pos per blood, doesn't know which type  . Polycystic ovarian syndrome   . Abnormal Pap smear 2006    leep  . History of chicken pox   . SVD (spontaneous vaginal delivery)     x 1  . Arthritis     osteoarthritis    Current Outpatient Prescriptions on File Prior to Visit  Medication Sig Dispense Refill  . Cholecalciferol 2000 UNITS TABS Take 1 tablet (2,000 Units total) by mouth daily. 30 each 3   No current facility-administered medications on file prior to visit.    Past Surgical History  Procedure Laterality Date  . Knee surgery  1998    right knee  . Leep  2006  . Hysteroscopy  2009    uterine polyp  . Sphincterotomy  2010  . Ivf retrival  2010, 2011  . Uterine polyp removal    . Dilation and evacuation N/A 09/14/2012    Procedure: DILATATION AND EVACUATION;  Surgeon: Allena Katz, MD;  Location: [facility];  Service: Gynecology;  Laterality: N/A;    No Known Allergies  Social History   Social History  . Marital Status: Married    Spouse Name: N/A  . Number of Children: N/A  . Years of Education: N/A   Occupational History  . Not on file.   Social History Main Topics  . Smoking status: Never Smoker   . Smokeless tobacco: Never Used  . Alcohol Use: No  . Drug Use: No  . Sexual Activity: Yes    Birth Control/ Protection: None   Other Topics Concern  . Not on file   Social History Narrative    Family History   Problem Relation Age of Onset  . Diabetes Maternal Grandmother   . Heart disease Maternal Grandmother   . Hypertension Maternal Grandfather   . Cancer Maternal Grandfather 70    bone, colon   . Diabetes Maternal Grandfather   . Diabetes Mother   . Cancer Mother 69    breast cancer  . Anesthesia problems Neg Hx   . Hypertension Father     BP 113/77 mmHg  Pulse 80  Ht 5\' 8"  (1.727 m)  Wt 226 lb (102.513 kg)  BMI 34.37 kg/m2  Review of Systems: See HPI above.    Objective:  Physical Exam:  Gen: NAD  Bilateral knees: No gross deformity, ecchymoses, swelling. TTP post patellar facets mildly L > R. FROM. Negative ant/post drawers. Negative valgus/varus testing. Negative lachmanns. Negative mcmurrays, apleys, patellar apprehension. NV intact distally.    Assessment & Plan:  1. Bilateral knee pain - primarily of left knee.  MRI of right knee 4 months ago showed only DJD and chondromalacia - exam and history consistent with this as cause of pain (also reports some low back pain likely due to DJD).  Discussed tylenol, nsaids, glucosamine, topical medications.  Injection given today.  Shown home exercises  to do daily.  Heat/ice.  F/u in 6 weeks or as needed.  After informed written consent, patient was seated on exam table. Left knee was prepped with alcohol swab and utilizing anteromedial approach, patient's left knee was injected intraarticularly with 3:1 marcaine: depomedrol. Patient tolerated the procedure well without immediate complications.

## 2015-08-20 NOTE — Assessment & Plan Note (Signed)
primarily of left knee.  MRI of right knee 4 months ago showed only DJD and chondromalacia - exam and history consistent with this as cause of pain (also reports some low back pain likely due to DJD).  Discussed tylenol, nsaids, glucosamine, topical medications.  Injection given today.  Shown home exercises to do daily.  Heat/ice.  F/u in 6 weeks or as needed.  After informed written consent, patient was seated on exam table. Left knee was prepped with alcohol swab and utilizing anteromedial approach, patient's left knee was injected intraarticularly with 3:1 marcaine: depomedrol. Patient tolerated the procedure well without immediate complications.

## 2015-09-06 ENCOUNTER — Telehealth: Payer: Self-pay | Admitting: Behavioral Health

## 2015-09-06 NOTE — Telephone Encounter (Signed)
Attempted to reach patient for Pre-Visit Call. Unable to leave a message at this time; per recording, the voice mailbox is full.

## 2015-09-07 ENCOUNTER — Ambulatory Visit (INDEPENDENT_AMBULATORY_CARE_PROVIDER_SITE_OTHER): Payer: 59 | Admitting: Adult Health

## 2015-09-07 ENCOUNTER — Encounter: Payer: 59 | Admitting: Physician Assistant

## 2015-09-07 ENCOUNTER — Ambulatory Visit (INDEPENDENT_AMBULATORY_CARE_PROVIDER_SITE_OTHER): Payer: 59 | Admitting: Family Medicine

## 2015-09-07 ENCOUNTER — Encounter: Payer: Self-pay | Admitting: Adult Health

## 2015-09-07 ENCOUNTER — Ambulatory Visit: Payer: 59 | Admitting: Emergency Medicine

## 2015-09-07 ENCOUNTER — Encounter: Payer: Self-pay | Admitting: Family Medicine

## 2015-09-07 VITALS — BP 131/93 | HR 75 | Ht 67.0 in | Wt 225.0 lb

## 2015-09-07 VITALS — BP 138/90 | HR 70 | Temp 98.3°F | Ht 67.0 in | Wt 230.0 lb

## 2015-09-07 DIAGNOSIS — M25561 Pain in right knee: Secondary | ICD-10-CM | POA: Diagnosis not present

## 2015-09-07 DIAGNOSIS — M25562 Pain in left knee: Secondary | ICD-10-CM

## 2015-09-07 DIAGNOSIS — R0602 Shortness of breath: Secondary | ICD-10-CM

## 2015-09-07 DIAGNOSIS — R938 Abnormal findings on diagnostic imaging of other specified body structures: Secondary | ICD-10-CM | POA: Diagnosis not present

## 2015-09-07 DIAGNOSIS — R9389 Abnormal findings on diagnostic imaging of other specified body structures: Secondary | ICD-10-CM

## 2015-09-07 MED ORDER — METHYLPREDNISOLONE ACETATE 40 MG/ML IJ SUSP
40.0000 mg | Freq: Once | INTRAMUSCULAR | Status: AC
  Administered 2015-09-07: 40 mg via INTRA_ARTICULAR

## 2015-09-07 NOTE — Patient Instructions (Signed)
Follow up with Dr. Lamonte Sakai  In 6 weeks with PFT and As needed

## 2015-09-07 NOTE — Progress Notes (Signed)
Subjective:    Patient ID: Brenda Williams, female    DOB: 1981-05-15, 35 y.o.   MRN: [id number]  HPI 35 y.o. woman, never smoker, little PMH. Referred for abnormal CT scan characterized by cystic changes, a single stable RML nodule. Original CT was in 3/'14 after she had anesthesia trouble following d&c. Denies dyspnea except when lying supine. No CP, no cough.   ROV 08/05/13 -- pt follows up for cystic changes and a RML nodule on CT scan chest. She had PFT today that show no AFL but some fatigability, decreased lung volumes and low DLCO that corrects for Va.  Her a1-AT is 110 (low normal) but genotype not reported. She was in the ED yesterday 1/22 for extreme fatigue, some mild SOB, no constitutional sx. She has otherwise been well. No significant cough, sometimes at night.   Acute OV 02/13/14 --  Complains of prod cough with yellow/clear mucus, head congestion w/ yellow mucus, PND, increased SOB, wheezing, fever up to 102 x 6 weeks.  Fevers have resolved. Cough is resolving. Still has some lingering clear mucus.  Reports symptoms are now improved x1-2weeks.   Cough was worse at night, keeping her up at night. That has improved.  Denies any hemoptysis, tightness, leg swelling, chest pain, n/v/d. No abx use.  Went to ER initially on 12/22/13 dx w/ viral syndrome. CXR neg.  Has known pulmonary nodule with planned CT chest 06/2014.   ROV 08/22/14 -- follow-up visit for a right middle lobe pulmonary nodule on CT scan of the chest as well as some mild evidence for emphysematous change versus cystic change on imaging. Spirometry has not confirmed any obstructive disease but she has had short windedness. A repeat CT scan on 06/30/14 showed that her 7 mm right middle lobe nodule is stable in size. She has been exercising more, has been working out more, some ups and downs   ROV 02/26/15 -- follow-up visit for lobe pulmonary nodule and some microcystic disease versus early emphysematous change on her CT scan  of the chest. Pulmonary function testing does not show overt obstruction. She reports that she has been doing fairly well, had a URI a couple weeks ago. She has some nocturnal cough, especially when she sleeps on her R side. She denies any GERD or PND. She had repeat CT scan of the chest performed on  12/26/14 that I personally reviewed. This showed that her right middle lobe pulmonary nodule is stable in size. It has now been unchanged for 2 years and 3 months. She should not need any repeat scans to follow this. She does still have innumerable small cystic lesions that are suggestive of early emphysematous change. She denies any significant dyspnea   09/07/2015 Follow up : Microcystic disease and RUL nodule.  Pt returns for 5 month follow up . Pt had a follow up CT chest in Dec 2016 with stable 37mm RUL .  Showed numerous progressive thin walled cystic structures. ? Lymphangioleiomyomatosis .  Alpha 1 was normal with MM  Last PFT was in 2015 was nml .  Previous echo in 2014 was nml .  She gets sob with activity .some dry cough  Denies chest pain, orthopnea, edema or fever.   Review of Systems As per HPI     Objective:   Physical Exam Filed Vitals:   09/07/15 1606  BP: 138/90  Pulse: 70  Temp: 98.3 F (36.8 C)  TempSrc: Oral  Height: 5\' 7"  (1.702 m)  Weight: 230  lb (104.327 kg)  SpO2: 96%   Body mass index is 36.01 kg/(m^2).    GEN: A/Ox3; pleasant , NAD, obese  HEENT:  OP clear, no lesions  NECK:  No bruit, no stridor, no lymphadenopathy  RESP  Clear bilaterally with no wheezes, crackles  CARD:  RRR, no m/r/g  , no peripheral edema, pulses intact, no cyanosis or clubbing.  Neuro: alert, no focal deficits noted.     CT chest 06/28/15  : No pleural fluid. Numerous, small thin walled cystic structures are identified throughout both lungs and appear progressive when compared with 06/22/2013. The right middle lobe pulmonary nodule measures 8 mm and is unchanged from  previous exam, image 24 of series 3. No additional pulmonary nodules or masses Identified.  Tiyana Galla NP-C  [facility]  09/07/2015     Assessment & Plan:

## 2015-09-07 NOTE — Patient Instructions (Signed)
Call me in just over a week to let me know how you're doing. If you're not improving I'd recommend going ahead with x-rays and an MRI of this knee. Icing, ibuprofen, tylenol as you have been. Straight leg raises, knee extensions 3 sets of 10 once a day. If you're doing well follow up with me in 5-6 weeks.

## 2015-09-10 ENCOUNTER — Encounter: Payer: Self-pay | Admitting: Physician Assistant

## 2015-09-10 ENCOUNTER — Telehealth: Payer: Self-pay | Admitting: Behavioral Health

## 2015-09-10 ENCOUNTER — Telehealth: Payer: Self-pay | Admitting: Physician Assistant

## 2015-09-10 NOTE — Telephone Encounter (Signed)
Attempted to reach patient for Pre-Visit Call. Unable to leave a message at this time; voice mailbox is full.

## 2015-09-10 NOTE — Telephone Encounter (Signed)
Marked to charge and mailing no show letter °

## 2015-09-10 NOTE — Telephone Encounter (Signed)
Charge. 

## 2015-09-10 NOTE — Telephone Encounter (Signed)
Pt was no show 09/07/15 7:30am, may have come in late but no notes, pt has rescheduled for 09/12/15, charge or no charge?

## 2015-09-11 ENCOUNTER — Telehealth: Payer: Self-pay | Admitting: Emergency Medicine

## 2015-09-11 NOTE — Telephone Encounter (Signed)
Pt c/o worsening sob since this morning-breathing is very labored.  Also notes runny nose, intermittent dizziness.  Denies cough, mucus production, fever, chills.  Pt compared this feeling to an anxiety attack she had a few years ago, but more persistent.  Pt has not taken anything to help with this.  Pt uses [facility] on [address]    RB please advise on recs.  Thanks.

## 2015-09-11 NOTE — Progress Notes (Signed)
PCP: Leeanne Rio, PA-C  Subjective:   HPI: Patient is a 35 y.o. female here for bilateral knee pain.  2/2: Patient reports having over 1 week of bilateral knee pain, left worse than right. Pain is 0/10 at rest, up to 7/10 with a lot of walking, weight bearing. Pain anterior, dull. Using heat.  Swelling last night. Pain does move around though. Had an MRI of right knee 4 months ago - showed only DJD, chondromalacia. No skin changes, fever, other complaints.  2/24: Patient reports she still has up to 5/10 pain, dull anteriorly. Not noticed much difference with injection. Tried ibuprofen, tylenol. Worse with ambulation. No skin changes, fever.  Past Medical History  Diagnosis Date  . Herpes     never had outbreak. pos per blood, doesn't know which type  . Polycystic ovarian syndrome   . Abnormal Pap smear 2006    leep  . History of chicken pox   . SVD (spontaneous vaginal delivery)     x 1  . Arthritis     osteoarthritis    Current Outpatient Prescriptions on File Prior to Visit  Medication Sig Dispense Refill  . Cholecalciferol 2000 UNITS TABS Take 1 tablet (2,000 Units total) by mouth daily. 30 each 3   No current facility-administered medications on file prior to visit.    Past Surgical History  Procedure Laterality Date  . Knee surgery  1998    right knee  . Leep  2006  . Hysteroscopy  2009    uterine polyp  . Sphincterotomy  2010  . Ivf retrival  2010, 2011  . Uterine polyp removal    . Dilation and evacuation N/A 09/14/2012    Procedure: DILATATION AND EVACUATION;  Surgeon: Allena Katz, MD;  Location: [facility];  Service: Gynecology;  Laterality: N/A;    No Known Allergies  Social History   Social History  . Marital Status: Married    Spouse Name: N/A  . Number of Children: N/A  . Years of Education: N/A   Occupational History  . Not on file.   Social History Main Topics  . Smoking status: Never Smoker   . Smokeless tobacco:  Never Used  . Alcohol Use: No  . Drug Use: No  . Sexual Activity: Yes    Birth Control/ Protection: None   Other Topics Concern  . Not on file   Social History Narrative    Family History  Problem Relation Age of Onset  . Diabetes Maternal Grandmother   . Heart disease Maternal Grandmother   . Hypertension Maternal Grandfather   . Cancer Maternal Grandfather 70    bone, colon   . Diabetes Maternal Grandfather   . Diabetes Mother   . Cancer Mother 52    breast cancer  . Anesthesia problems Neg Hx   . Hypertension Father     BP 131/93 mmHg  Pulse 75  Ht 5\' 7"  (1.702 m)  Wt 225 lb (102.059 kg)  BMI 35.23 kg/m2  Review of Systems: See HPI above.    Objective:  Physical Exam:  Gen: NAD  Left knee: Mild effusion . No other deformity, ecchymoses. TTP post patellar facets mildly L > R. FROM. Negative ant/post drawers. Negative valgus/varus testing. Negative lachmanns. Negative mcmurrays, apleys, patellar apprehension. NV intact distally.    Right knee: No gross deformity, ecchymoses, swelling. TTP post patellar facets mildly L > R. FROM. Negative ant/post drawers. Negative valgus/varus testing. Negative lachmanns. Negative mcmurrays, apleys, patellar apprehension. NV  intact distally.  Assessment & Plan:  1. Bilateral knee pain - primarily of left knee.  MRI of right knee 4 months ago showed only DJD and chondromalacia - exam and history consistent with this as cause of pain though not noticed much improvement with injection last visit.  Marland Kitchen  Discussed tylenol, nsaids, glucosamine, topical medications.  We discussed options and went ahead with different approach to injection with ultrasound guidance in case anterior injection did not reach knee joint.  F/u in 5-6 weeks.  After informed written consent, patient was lying supine on exam table. Left knee was prepped with alcohol swab and utilizing superolateral approach with ultrasound guidance, patient's left knee was  injected intraarticularly with 3:1 marcaine: depomedrol. Patient tolerated the procedure well without immediate complications.

## 2015-09-11 NOTE — Assessment & Plan Note (Signed)
primarily of left knee.  MRI of right knee 4 months ago showed only DJD and chondromalacia - exam and history consistent with this as cause of pain though not noticed much improvement with injection last visit.  Marland Kitchen  Discussed tylenol, nsaids, glucosamine, topical medications.  We discussed options and went ahead with different approach to injection with ultrasound guidance in case anterior injection did not reach knee joint.  F/u in 5-6 weeks.  After informed written consent, patient was lying supine on exam table. Left knee was prepped with alcohol swab and utilizing superolateral approach with ultrasound guidance, patient's left knee was injected intraarticularly with 3:1 marcaine: depomedrol. Patient tolerated the procedure well without immediate complications.

## 2015-09-11 NOTE — Telephone Encounter (Signed)
Brenda Williams needs to be seen by me. Work her in so I can discuss her chronic sx and her Ct scan chest.  With regard to acute dyspnea, dizziness > if these continue she needs to seek urgent care, go to the ED

## 2015-09-11 NOTE — Telephone Encounter (Signed)
Pt is aware of RB's response. She has upcoming appointment with RB, she will keep this. Nothing further was needed.

## 2015-09-12 ENCOUNTER — Encounter: Payer: 59 | Admitting: Physician Assistant

## 2015-09-12 NOTE — Assessment & Plan Note (Signed)
  CT chest 06/28/15  : No pleural fluid. Numerous, small thin walled cystic structures are identified throughout both lungs and appear progressive when compared with 06/22/2013. The right middle lobe pulmonary nodule measures 8 mm and is unchanged from previous exam, image 24 of series 3. No additional pulmonary nodules or masses Identified.  >? Lymphangioleiomyomatosis .  Alpha 1 was normal with MM  Needs PFT and decide if further testing is indicated ? bx .  Plan  follow up in 6 weeks with PFT.

## 2015-09-14 ENCOUNTER — Telehealth: Payer: Self-pay

## 2015-09-14 NOTE — Telephone Encounter (Signed)
Called patient for Pre-visit information. Unable to leave message VM full.

## 2015-09-17 ENCOUNTER — Ambulatory Visit (INDEPENDENT_AMBULATORY_CARE_PROVIDER_SITE_OTHER): Payer: 59 | Admitting: Physician Assistant

## 2015-09-17 ENCOUNTER — Encounter: Payer: Self-pay | Admitting: Physician Assistant

## 2015-09-17 VITALS — BP 125/71 | HR 71 | Temp 97.9°F | Ht 67.0 in | Wt 234.6 lb

## 2015-09-17 DIAGNOSIS — Z1239 Encounter for other screening for malignant neoplasm of breast: Secondary | ICD-10-CM

## 2015-09-17 DIAGNOSIS — Z0000 Encounter for general adult medical examination without abnormal findings: Secondary | ICD-10-CM | POA: Diagnosis not present

## 2015-09-17 NOTE — Assessment & Plan Note (Signed)
Breast exam performed. Two 2-3 mm superficial cysts noted of areola of left breast without sign of infection. No palpable breast masses on exam. + family history of breast cancer at younger age. Will obtain mammogram for screening.

## 2015-09-17 NOTE — Patient Instructions (Signed)
Please schedule a lab appointment for fasting labs. I will call you with your results. If your blood work is normal we will follow-up yearly for physicals.   Please consider scheduling an appointment with Dr. Ihor Dow across the hall. Regardless, please schedule with a GYN.  You will be contacted for screening mammogram.  Preventive Care for Adults, Female A healthy lifestyle and preventive care can promote health and wellness. Preventive health guidelines for women include the following key practices.  A routine yearly physical is a good way to check with your health care provider about your health and preventive screening. It is a chance to share any concerns and updates on your health and to receive a thorough exam.  Visit your dentist for a routine exam and preventive care every 6 months. Brush your teeth twice a day and floss once a day. Good oral hygiene prevents tooth decay and gum disease.  The frequency of eye exams is based on your age, health, family medical history, use of contact lenses, and other factors. Follow your health care provider's recommendations for frequency of eye exams.  Eat a healthy diet. Foods like vegetables, fruits, whole grains, low-fat dairy products, and lean protein foods contain the nutrients you need without too many calories. Decrease your intake of foods high in solid fats, added sugars, and salt. Eat the right amount of calories for you.Get information about a proper diet from your health care provider, if necessary.  Regular physical exercise is one of the most important things you can do for your health. Most adults should get at least 150 minutes of moderate-intensity exercise (any activity that increases your heart rate and causes you to sweat) each week. In addition, most adults need muscle-strengthening exercises on 2 or more days a week.  Maintain a healthy weight. The body mass index (BMI) is a screening tool to identify possible weight  problems. It provides an estimate of body fat based on height and weight. Your health care provider can find your BMI and can help you achieve or maintain a healthy weight.For adults 20 years and older:  A BMI below 18.5 is considered underweight.  A BMI of 18.5 to 24.9 is normal.  A BMI of 25 to 29.9 is considered overweight.  A BMI of 30 and above is considered obese.  Maintain normal blood lipids and cholesterol levels by exercising and minimizing your intake of saturated fat. Eat a balanced diet with plenty of fruit and vegetables. Blood tests for lipids and cholesterol should begin at age 61 and be repeated every 5 years. If your lipid or cholesterol levels are high, you are over 50, or you are at high risk for heart disease, you may need your cholesterol levels checked more frequently.Ongoing high lipid and cholesterol levels should be treated with medicines if diet and exercise are not working.  If you smoke, find out from your health care provider how to quit. If you do not use tobacco, do not start.  Lung cancer screening is recommended for adults aged 38-80 years who are at high risk for developing lung cancer because of a history of smoking. A yearly low-dose CT scan of the lungs is recommended for people who have at least a 30-pack-year history of smoking and are a current smoker or have quit within the past 15 years. A pack year of smoking is smoking an average of 1 pack of cigarettes a day for 1 year (for example: 1 pack a day for 30 years or  2 packs a day for 15 years). Yearly screening should continue until the smoker has stopped smoking for at least 15 years. Yearly screening should be stopped for people who develop a health problem that would prevent them from having lung cancer treatment.  If you are pregnant, do not drink alcohol. If you are breastfeeding, be very cautious about drinking alcohol. If you are not pregnant and choose to drink alcohol, do not have more than 1 drink  per day. One drink is considered to be 12 ounces (355 mL) of beer, 5 ounces (148 mL) of wine, or 1.5 ounces (44 mL) of liquor.  Avoid use of street drugs. Do not share needles with anyone. Ask for help if you need support or instructions about stopping the use of drugs.  High blood pressure causes heart disease and increases the risk of stroke. Your blood pressure should be checked at least every 1 to 2 years. Ongoing high blood pressure should be treated with medicines if weight loss and exercise do not work.  If you are 7-4 years old, ask your health care provider if you should take aspirin to prevent strokes.  Diabetes screening is done by taking a blood sample to check your blood glucose level after you have not eaten for a certain period of time (fasting). If you are not overweight and you do not have risk factors for diabetes, you should be screened once every 3 years starting at age 21. If you are overweight or obese and you are 39-33 years of age, you should be screened for diabetes every year as part of your cardiovascular risk assessment.  Breast cancer screening is essential preventive care for women. You should practice "breast self-awareness." This means understanding the normal appearance and feel of your breasts and may include breast self-examination. Any changes detected, no matter how small, should be reported to a health care provider. Women in their 44s and 30s should have a clinical breast exam (CBE) by a health care provider as part of a regular health exam every 1 to 3 years. After age 82, women should have a CBE every year. Starting at age 61, women should consider having a mammogram (breast X-ray test) every year. Women who have a family history of breast cancer should talk to their health care provider about genetic screening. Women at a high risk of breast cancer should talk to their health care providers about having an MRI and a mammogram every year.  Breast cancer gene  (BRCA)-related cancer risk assessment is recommended for women who have family members with BRCA-related cancers. BRCA-related cancers include breast, ovarian, tubal, and peritoneal cancers. Having family members with these cancers may be associated with an increased risk for harmful changes (mutations) in the breast cancer genes BRCA1 and BRCA2. Results of the assessment will determine the need for genetic counseling and BRCA1 and BRCA2 testing.  Your health care provider may recommend that you be screened regularly for cancer of the pelvic organs (ovaries, uterus, and vagina). This screening involves a pelvic examination, including checking for microscopic changes to the surface of your cervix (Pap test). You may be encouraged to have this screening done every 3 years, beginning at age 63.  For women ages 47-65, health care providers may recommend pelvic exams and Pap testing every 3 years, or they may recommend the Pap and pelvic exam, combined with testing for human papilloma virus (HPV), every 5 years. Some types of HPV increase your risk of cervical cancer. Testing for  HPV may also be done on women of any age with unclear Pap test results.  Other health care providers may not recommend any screening for nonpregnant women who are considered low risk for pelvic cancer and who do not have symptoms. Ask your health care provider if a screening pelvic exam is right for you.  If you have had past treatment for cervical cancer or a condition that could lead to cancer, you need Pap tests and screening for cancer for at least 20 years after your treatment. If Pap tests have been discontinued, your risk factors (such as having a new sexual partner) need to be reassessed to determine if screening should resume. Some women have medical problems that increase the chance of getting cervical cancer. In these cases, your health care provider may recommend more frequent screening and Pap tests.  Colorectal cancer can  be detected and often prevented. Most routine colorectal cancer screening begins at the age of 29 years and continues through age 83 years. However, your health care provider may recommend screening at an earlier age if you have risk factors for colon cancer. On a yearly basis, your health care provider may provide home test kits to check for hidden blood in the stool. Use of a small camera at the end of a tube, to directly examine the colon (sigmoidoscopy or colonoscopy), can detect the earliest forms of colorectal cancer. Talk to your health care provider about this at age 39, when routine screening begins. Direct exam of the colon should be repeated every 5-10 years through age 32 years, unless early forms of precancerous polyps or small growths are found.  People who are at an increased risk for hepatitis B should be screened for this virus. You are considered at high risk for hepatitis B if:  You were born in a country where hepatitis B occurs often. Talk with your health care provider about which countries are considered high risk.  Your parents were born in a high-risk country and you have not received a shot to protect against hepatitis B (hepatitis B vaccine).  You have HIV or AIDS.  You use needles to inject street drugs.  You live with, or have sex with, someone who has hepatitis B.  You get hemodialysis treatment.  You take certain medicines for conditions like cancer, organ transplantation, and autoimmune conditions.  Hepatitis C blood testing is recommended for all people born from [birth year] through 1965 and any individual with known risks for hepatitis C.  Practice safe sex. Use condoms and avoid high-risk sexual practices to reduce the spread of sexually transmitted infections (STIs). STIs include gonorrhea, chlamydia, syphilis, trichomonas, herpes, HPV, and human immunodeficiency virus (HIV). Herpes, HIV, and HPV are viral illnesses that have no cure. They can result in disability,  cancer, and death.  You should be screened for sexually transmitted illnesses (STIs) including gonorrhea and chlamydia if:  You are sexually active and are younger than 24 years.  You are older than 24 years and your health care provider tells you that you are at risk for this type of infection.  Your sexual activity has changed since you were last screened and you are at an increased risk for chlamydia or gonorrhea. Ask your health care provider if you are at risk.  If you are at risk of being infected with HIV, it is recommended that you take a prescription medicine daily to prevent HIV infection. This is called preexposure prophylaxis (PrEP). You are considered at risk if:  You are sexually active and do not regularly use condoms or know the HIV status of your partner(s).  You take drugs by injection.  You are sexually active with a partner who has HIV.  Talk with your health care provider about whether you are at high risk of being infected with HIV. If you choose to begin PrEP, you should first be tested for HIV. You should then be tested every 3 months for as long as you are taking PrEP.  Osteoporosis is a disease in which the bones lose minerals and strength with aging. This can result in serious bone fractures or breaks. The risk of osteoporosis can be identified using a bone density scan. Women ages 5 years and over and women at risk for fractures or osteoporosis should discuss screening with their health care providers. Ask your health care provider whether you should take a calcium supplement or vitamin D to reduce the rate of osteoporosis.  Menopause can be associated with physical symptoms and risks. Hormone replacement therapy is available to decrease symptoms and risks. You should talk to your health care provider about whether hormone replacement therapy is right for you.  Use sunscreen. Apply sunscreen liberally and repeatedly throughout the day. You should seek shade when  your shadow is shorter than you. Protect yourself by wearing long sleeves, pants, a wide-brimmed hat, and sunglasses year round, whenever you are outdoors.  Once a month, do a whole body skin exam, using a mirror to look at the skin on your back. Tell your health care provider of new moles, moles that have irregular borders, moles that are larger than a pencil eraser, or moles that have changed in shape or color.  Stay current with required vaccines (immunizations).  Influenza vaccine. All adults should be immunized every year.  Tetanus, diphtheria, and acellular pertussis (Td, Tdap) vaccine. Pregnant women should receive 1 dose of Tdap vaccine during each pregnancy. The dose should be obtained regardless of the length of time since the last dose. Immunization is preferred during the 27th-36th week of gestation. An adult who has not previously received Tdap or who does not know her vaccine status should receive 1 dose of Tdap. This initial dose should be followed by tetanus and diphtheria toxoids (Td) booster doses every 10 years. Adults with an unknown or incomplete history of completing a 3-dose immunization series with Td-containing vaccines should begin or complete a primary immunization series including a Tdap dose. Adults should receive a Td booster every 10 years.  Varicella vaccine. An adult without evidence of immunity to varicella should receive 2 doses or a second dose if she has previously received 1 dose. Pregnant females who do not have evidence of immunity should receive the first dose after pregnancy. This first dose should be obtained before leaving the health care facility. The second dose should be obtained 4-8 weeks after the first dose.  Human papillomavirus (HPV) vaccine. Females aged 13-26 years who have not received the vaccine previously should obtain the 3-dose series. The vaccine is not recommended for use in pregnant females. However, pregnancy testing is not needed before  receiving a dose. If a female is found to be pregnant after receiving a dose, no treatment is needed. In that case, the remaining doses should be delayed until after the pregnancy. Immunization is recommended for any person with an immunocompromised condition through the age of 20 years if she did not get any or all doses earlier. During the 3-dose series, the second dose should  be obtained 4-8 weeks after the first dose. The third dose should be obtained 24 weeks after the first dose and 16 weeks after the second dose.  Zoster vaccine. One dose is recommended for adults aged 61 years or older unless certain conditions are present.  Measles, mumps, and rubella (MMR) vaccine. Adults born before [birth year] generally are considered immune to measles and mumps. Adults born in [birth year] or later should have 1 or more doses of MMR vaccine unless there is a contraindication to the vaccine or there is laboratory evidence of immunity to each of the three diseases. A routine second dose of MMR vaccine should be obtained at least 28 days after the first dose for students attending postsecondary schools, health care workers, or international travelers. People who received inactivated measles vaccine or an unknown type of measles vaccine during 1963-1967 should receive 2 doses of MMR vaccine. People who received inactivated mumps vaccine or an unknown type of mumps vaccine before 1979 and are at high risk for mumps infection should consider immunization with 2 doses of MMR vaccine. For females of childbearing age, rubella immunity should be determined. If there is no evidence of immunity, females who are not pregnant should be vaccinated. If there is no evidence of immunity, females who are pregnant should delay immunization until after pregnancy. Unvaccinated health care workers born before [birth year] who lack laboratory evidence of measles, mumps, or rubella immunity or laboratory confirmation of disease should consider measles and mumps  immunization with 2 doses of MMR vaccine or rubella immunization with 1 dose of MMR vaccine.  Pneumococcal 13-valent conjugate (PCV13) vaccine. When indicated, a person who is uncertain of his immunization history and has no record of immunization should receive the PCV13 vaccine. All adults 29 years of age and older should receive this vaccine. An adult aged 43 years or older who has certain medical conditions and has not been previously immunized should receive 1 dose of PCV13 vaccine. This PCV13 should be followed with a dose of pneumococcal polysaccharide (PPSV23) vaccine. Adults who are at high risk for pneumococcal disease should obtain the PPSV23 vaccine at least 8 weeks after the dose of PCV13 vaccine. Adults older than 37 years of age who have normal immune system function should obtain the PPSV23 vaccine dose at least 1 year after the dose of PCV13 vaccine.  Pneumococcal polysaccharide (PPSV23) vaccine. When PCV13 is also indicated, PCV13 should be obtained first. All adults aged 46 years and older should be immunized. An adult younger than age 33 years who has certain medical conditions should be immunized. Any person who resides in a nursing home or long-term care facility should be immunized. An adult smoker should be immunized. People with an immunocompromised condition and certain other conditions should receive both PCV13 and PPSV23 vaccines. People with human immunodeficiency virus (HIV) infection should be immunized as soon as possible after diagnosis. Immunization during chemotherapy or radiation therapy should be avoided. Routine use of PPSV23 vaccine is not recommended for American Indians, [place] Natives, or people younger than 65 years unless there are medical conditions that require PPSV23 vaccine. When indicated, people who have unknown immunization and have no record of immunization should receive PPSV23 vaccine. One-time revaccination 5 years after the first dose of PPSV23 is  recommended for people aged 19-64 years who have chronic kidney failure, nephrotic syndrome, asplenia, or immunocompromised conditions. People who received 1-2 doses of PPSV23 before age 40 years should receive another dose of PPSV23 vaccine at age 14 years or  later if at least 5 years have passed since the previous dose. Doses of PPSV23 are not needed for people immunized with PPSV23 at or after age 62 years.  Meningococcal vaccine. Adults with asplenia or persistent complement component deficiencies should receive 2 doses of quadrivalent meningococcal conjugate (MenACWY-D) vaccine. The doses should be obtained at least 2 months apart. Microbiologists working with certain meningococcal bacteria, [place] recruits, people at risk during an outbreak, and people who travel to or live in countries with a high rate of meningitis should be immunized. A first-year college student up through age 34 years who is living in a residence hall should receive a dose if she did not receive a dose on or after her 16th birthday. Adults who have certain high-risk conditions should receive one or more doses of vaccine.  Hepatitis A vaccine. Adults who wish to be protected from this disease, have certain high-risk conditions, work with hepatitis A-infected animals, work in hepatitis A research labs, or travel to or work in countries with a high rate of hepatitis A should be immunized. Adults who were previously unvaccinated and who anticipate close contact with an international adoptee during the first 60 days after arrival in the [place] from a country with a high rate of hepatitis A should be immunized.  Hepatitis B vaccine. Adults who wish to be protected from this disease, have certain high-risk conditions, may be exposed to blood or other infectious body fluids, are household contacts or sex partners of hepatitis B positive people, are clients or workers in certain care facilities, or travel to or work in countries  with a high rate of hepatitis B should be immunized.  Haemophilus influenzae type b (Hib) vaccine. A previously unvaccinated person with asplenia or sickle cell disease or having a scheduled splenectomy should receive 1 dose of Hib vaccine. Regardless of previous immunization, a recipient of a hematopoietic stem cell transplant should receive a 3-dose series 6-12 months after her successful transplant. Hib vaccine is not recommended for adults with HIV infection. Preventive Services / Frequency Ages 62 to 48 years  Blood pressure check.** / Every 3-5 years.  Lipid and cholesterol check.** / Every 5 years beginning at age 44.  Clinical breast exam.** / Every 3 years for women in their 29s and 15s.  BRCA-related cancer risk assessment.** / For women who have family members with a BRCA-related cancer (breast, ovarian, tubal, or peritoneal cancers).  Pap test.** / Every 2 years from ages 19 through 32. Every 3 years starting at age 26 through age 8 or 51 with a history of 3 consecutive normal Pap tests.  HPV screening.** / Every 3 years from ages 40 through ages 37 to 39 with a history of 3 consecutive normal Pap tests.  Hepatitis C blood test.** / For any individual with known risks for hepatitis C.  Skin self-exam. / Monthly.  Influenza vaccine. / Every year.  Tetanus, diphtheria, and acellular pertussis (Tdap, Td) vaccine.** / Consult your health care provider. Pregnant women should receive 1 dose of Tdap vaccine during each pregnancy. 1 dose of Td every 10 years.  Varicella vaccine.** / Consult your health care provider. Pregnant females who do not have evidence of immunity should receive the first dose after pregnancy.  HPV vaccine. / 3 doses over 6 months, if 32 and younger. The vaccine is not recommended for use in pregnant females. However, pregnancy testing is not needed before receiving a dose.  Measles, mumps, rubella (MMR) vaccine.** / You need at  least 1 dose of MMR if you  were born in 1957 or later. You may also need a 2nd dose. For females of childbearing age, rubella immunity should be determined. If there is no evidence of immunity, females who are not pregnant should be vaccinated. If there is no evidence of immunity, females who are pregnant should delay immunization until after pregnancy.  Pneumococcal 13-valent conjugate (PCV13) vaccine.** / Consult your health care provider.  Pneumococcal polysaccharide (PPSV23) vaccine.** / 1 to 2 doses if you smoke cigarettes or if you have certain conditions.  Meningococcal vaccine.** / 1 dose if you are age 86 to 17 years and a Market researcher living in a residence hall, or have one of several medical conditions, you need to get vaccinated against meningococcal disease. You may also need additional booster doses.  Hepatitis A vaccine.** / Consult your health care provider.  Hepatitis B vaccine.** / Consult your health care provider.  Haemophilus influenzae type b (Hib) vaccine.** / Consult your health care provider. Ages 57 to 43 years  Blood pressure check.** / Every year.  Lipid and cholesterol check.** / Every 5 years beginning at age 22 years.  Lung cancer screening. / Every year if you are aged 76-80 years and have a 30-pack-year history of smoking and currently smoke or have quit within the past 15 years. Yearly screening is stopped once you have quit smoking for at least 15 years or develop a health problem that would prevent you from having lung cancer treatment.  Clinical breast exam.** / Every year after age 55 years.  BRCA-related cancer risk assessment.** / For women who have family members with a BRCA-related cancer (breast, ovarian, tubal, or peritoneal cancers).  Mammogram.** / Every year beginning at age 71 years and continuing for as long as you are in good health. Consult with your health care provider.  Pap test.** / Every 3 years starting at age 10 years through age 25 or 89 years  with a history of 3 consecutive normal Pap tests.  HPV screening.** / Every 3 years from ages 82 years through ages 70 to 10 years with a history of 3 consecutive normal Pap tests.  Fecal occult blood test (FOBT) of stool. / Every year beginning at age 63 years and continuing until age 54 years. You may not need to do this test if you get a colonoscopy every 10 years.  Flexible sigmoidoscopy or colonoscopy.** / Every 5 years for a flexible sigmoidoscopy or every 10 years for a colonoscopy beginning at age 46 years and continuing until age 66 years.  Hepatitis C blood test.** / For all people born from [birth year] through 1965 and any individual with known risks for hepatitis C.  Skin self-exam. / Monthly.  Influenza vaccine. / Every year.  Tetanus, diphtheria, and acellular pertussis (Tdap/Td) vaccine.** / Consult your health care provider. Pregnant women should receive 1 dose of Tdap vaccine during each pregnancy. 1 dose of Td every 10 years.  Varicella vaccine.** / Consult your health care provider. Pregnant females who do not have evidence of immunity should receive the first dose after pregnancy.  Zoster vaccine.** / 1 dose for adults aged 80 years or older.  Measles, mumps, rubella (MMR) vaccine.** / You need at least 1 dose of MMR if you were born in 1957 or later. You may also need a second dose. For females of childbearing age, rubella immunity should be determined. If there is no evidence of immunity, females who are not pregnant should  be vaccinated. If there is no evidence of immunity, females who are pregnant should delay immunization until after pregnancy.  Pneumococcal 13-valent conjugate (PCV13) vaccine.** / Consult your health care provider.  Pneumococcal polysaccharide (PPSV23) vaccine.** / 1 to 2 doses if you smoke cigarettes or if you have certain conditions.  Meningococcal vaccine.** / Consult your health care provider.  Hepatitis A vaccine.** / Consult your health care  provider.  Hepatitis B vaccine.** / Consult your health care provider.  Haemophilus influenzae type b (Hib) vaccine.** / Consult your health care provider. Ages 80 years and over  Blood pressure check.** / Every year.  Lipid and cholesterol check.** / Every 5 years beginning at age 91 years.  Lung cancer screening. / Every year if you are aged 110-80 years and have a 30-pack-year history of smoking and currently smoke or have quit within the past 15 years. Yearly screening is stopped once you have quit smoking for at least 15 years or develop a health problem that would prevent you from having lung cancer treatment.  Clinical breast exam.** / Every year after age 80 years.  BRCA-related cancer risk assessment.** / For women who have family members with a BRCA-related cancer (breast, ovarian, tubal, or peritoneal cancers).  Mammogram.** / Every year beginning at age 61 years and continuing for as long as you are in good health. Consult with your health care provider.  Pap test.** / Every 3 years starting at age 36 years through age 31 or 19 years with 3 consecutive normal Pap tests. Testing can be stopped between 65 and 70 years with 3 consecutive normal Pap tests and no abnormal Pap or HPV tests in the past 10 years.  HPV screening.** / Every 3 years from ages 110 years through ages 34 or 53 years with a history of 3 consecutive normal Pap tests. Testing can be stopped between 65 and 70 years with 3 consecutive normal Pap tests and no abnormal Pap or HPV tests in the past 10 years.  Fecal occult blood test (FOBT) of stool. / Every year beginning at age 13 years and continuing until age 80 years. You may not need to do this test if you get a colonoscopy every 10 years.  Flexible sigmoidoscopy or colonoscopy.** / Every 5 years for a flexible sigmoidoscopy or every 10 years for a colonoscopy beginning at age 73 years and continuing until age 48 years.  Hepatitis C blood test.** / For all  people born from [birth year] through 1967 and any individual with known risks for hepatitis C.  Osteoporosis screening.** / A one-time screening for women ages 2 years and over and women at risk for fractures or osteoporosis.  Skin self-exam. / Monthly.  Influenza vaccine. / Every year.  Tetanus, diphtheria, and acellular pertussis (Tdap/Td) vaccine.** / 1 dose of Td every 10 years.  Varicella vaccine.** / Consult your health care provider.  Zoster vaccine.** / 1 dose for adults aged 19 years or older.  Pneumococcal 13-valent conjugate (PCV13) vaccine.** / Consult your health care provider.  Pneumococcal polysaccharide (PPSV23) vaccine.** / 1 dose for all adults aged 45 years and older.  Meningococcal vaccine.** / Consult your health care provider.  Hepatitis A vaccine.** / Consult your health care provider.  Hepatitis B vaccine.** / Consult your health care provider.  Haemophilus influenzae type b (Hib) vaccine.** / Consult your health care provider. ** Family history and personal history of risk and conditions may change your health care provider's recommendations.   This information is not  intended to replace advice given to you by your health care provider. Make sure you discuss any questions you have with your health care provider.   Document Released: 08/26/2001 Document Revised: 07/21/2014 Document Reviewed: 11/25/2010 Elsevier Interactive Patient Education [employer].

## 2015-09-17 NOTE — Progress Notes (Signed)
Patient presents to clinic today for annual exam.  Patient is fasting for labs.  Acute Concerns: Denies acute concerns today.  Health Maintenance: Immunizations -- Declines flu shot. Tetanus up-to-date.  Mammogram -- Mother diagnosed at age 37. Denies other family history of breast cancer. Would like mammogram today. Endorses two bumps on the skin noted 1 year ago. Denies breast mass.  PAP -- Overdue. Denies history of abnormal PAP smear. Is looking for new GYN.  Past Medical History  Diagnosis Date  . Herpes     never had outbreak. pos per blood, doesn't know which type  . Polycystic ovarian syndrome   . Abnormal Pap smear 2006    leep  . History of chicken pox   . SVD (spontaneous vaginal delivery)     x 1  . Arthritis     osteoarthritis    Past Surgical History  Procedure Laterality Date  . Knee surgery  1998    right knee  . Leep  2006  . Hysteroscopy  2009    uterine polyp  . Sphincterotomy  2010  . Ivf retrival  2010, 2011  . Uterine polyp removal    . Dilation and evacuation N/A 09/14/2012    Procedure: DILATATION AND EVACUATION;  Surgeon: Allena Katz, MD;  Location: [facility];  Service: Gynecology;  Laterality: N/A;    Current Outpatient Prescriptions on File Prior to Visit  Medication Sig Dispense Refill  . ibuprofen (ADVIL,MOTRIN) 100 MG tablet Take 100 mg by mouth every 6 (six) hours as needed for fever.     No current facility-administered medications on file prior to visit.    No Known Allergies  Family History  Problem Relation Age of Onset  . Diabetes Maternal Grandmother   . Heart disease Maternal Grandmother   . Hypertension Maternal Grandfather   . Cancer Maternal Grandfather 70    bone, colon   . Diabetes Maternal Grandfather   . Diabetes Mother   . Cancer Mother 12    breast cancer  . Anesthesia problems Neg Hx   . Hypertension Father     Social History   Social History  . Marital Status: Married    Spouse Name: N/A  .  Number of Children: N/A  . Years of Education: N/A   Occupational History  . Not on file.   Social History Main Topics  . Smoking status: Never Smoker   . Smokeless tobacco: Never Used  . Alcohol Use: No  . Drug Use: No  . Sexual Activity: Yes    Birth Control/ Protection: None   Other Topics Concern  . Not on file   Social History Narrative   Review of Systems  Constitutional: Negative for fever and weight loss.  HENT: Negative for ear discharge, ear pain, hearing loss and tinnitus.   Eyes: Negative for blurred vision, double vision, photophobia and pain.  Respiratory: Negative for cough and shortness of breath.   Cardiovascular: Negative for chest pain and palpitations.  Gastrointestinal: Negative for heartburn, nausea, vomiting, abdominal pain, diarrhea, constipation, blood in stool and melena.  Genitourinary: Negative for dysuria, urgency, frequency, hematuria and flank pain.  Musculoskeletal: Negative for falls.  Neurological: Negative for dizziness, loss of consciousness and headaches.  Endo/Heme/Allergies: Negative for environmental allergies.  Psychiatric/Behavioral: Negative for depression, suicidal ideas, hallucinations and substance abuse. The patient is not nervous/anxious and does not have insomnia.    BP 125/71 mmHg  Pulse 71  Temp(Src) 97.9 F (36.6 C) (Oral)  Ht 5\' 7"  (1.702 m)  Wt 234 lb 9.6 oz (106.414 kg)  BMI 36.73 kg/m2  SpO2 98%  LMP 08/15/2015  Physical Exam  Constitutional: She is oriented to person, place, and time and well-developed, well-nourished, and in no distress.  HENT:  Head: Normocephalic and atraumatic.  Right Ear: Tympanic membrane, external ear and ear canal normal.  Left Ear: Tympanic membrane, external ear and ear canal normal.  Nose: Nose normal. No mucosal edema.  Mouth/Throat: Uvula is midline, oropharynx is clear and moist and mucous membranes are normal. No oropharyngeal exudate or posterior oropharyngeal erythema.  Eyes:  Conjunctivae are normal. Pupils are equal, round, and reactive to light.  Neck: Neck supple. No thyromegaly present.  Cardiovascular: Normal rate, regular rhythm, normal heart sounds and intact distal pulses.   Pulmonary/Chest: Effort normal and breath sounds normal. No respiratory distress. She has no wheezes. She has no rales.  Abdominal: Soft. Bowel sounds are normal. She exhibits no distension and no mass. There is no tenderness. There is no rebound and no guarding.  Lymphadenopathy:    She has no cervical adenopathy.  Neurological: She is alert and oriented to person, place, and time. No cranial nerve deficit.  Skin: Skin is warm and dry. No rash noted.  Psychiatric: Affect normal.  Vitals reviewed.  Recent Results (from the past 2160 hour(s))  VEGF, Serum     Status: None   Collection Time: 07/04/15  1:40 PM  Result Value Ref Range   VEGF, Serum 499 62 - 707 pg/mL    Comment: Results for this test are for research purposes only by the assay's manufacturer.  The performance characteristics of this product have not been established.  Results should not be used as a diagnostic procedure without confirmation of the diagnosis by another medically established diagnostic product or procedure.     Assessment/Plan: Visit for preventive health examination Depression screen negative. Health Maintenance reviewed -- Declines flu shot. Tetanus up-to-date. Will order screening mammogram due to family history. Preventive schedule discussed and handout given in AVS. Will obtain fasting labs today.   Breast cancer screening  Breast exam performed. Two 2-3 mm superficial cysts noted of areola of left breast without sign of infection. No palpable breast masses on exam. + family history of breast cancer at younger age. Will obtain mammogram for screening.

## 2015-09-17 NOTE — Assessment & Plan Note (Signed)
Depression screen negative. Health Maintenance reviewed -- Declines flu shot. Tetanus up-to-date. Will order screening mammogram due to family history. Preventive schedule discussed and handout given in AVS. Will obtain fasting labs today.

## 2015-09-17 NOTE — Progress Notes (Signed)
Pre visit review using our clinic review tool, if applicable. No additional management support is needed unless otherwise documented below in the visit note. 

## 2015-09-18 ENCOUNTER — Other Ambulatory Visit: Payer: 59

## 2015-09-18 LAB — COMPREHENSIVE METABOLIC PANEL
ALT: 13 U/L (ref 0–35)
AST: 9 U/L (ref 0–37)
Albumin: 4.1 g/dL (ref 3.5–5.2)
Alkaline Phosphatase: 49 U/L (ref 39–117)
BUN: 10 mg/dL (ref 6–23)
CHLORIDE: 106 meq/L (ref 96–112)
CO2: 26 meq/L (ref 19–32)
CREATININE: 0.66 mg/dL (ref 0.40–1.20)
Calcium: 9.5 mg/dL (ref 8.4–10.5)
GFR: 130.11 mL/min (ref >60.00)
Glucose, Bld: 114 mg/dL — ABNORMAL HIGH (ref 70–99)
POTASSIUM: 3.8 meq/L (ref 3.5–5.1)
SODIUM: 141 meq/L (ref 135–145)
Total Bilirubin: 0.4 mg/dL (ref 0.2–1.2)
Total Protein: 6.9 g/dL (ref 6.0–8.3)

## 2015-09-18 LAB — LIPID PANEL
CHOLESTEROL: 223 mg/dL — AB (ref 0–200)
HDL: 57 mg/dL (ref >39.00)
LDL CALC: 154 mg/dL — AB (ref 0–99)
NONHDL: 166.37
Total CHOL/HDL Ratio: 4
Triglycerides: 63 mg/dL (ref 0.0–149.0)
VLDL: 12.6 mg/dL (ref 0.0–40.0)

## 2015-09-18 LAB — URINALYSIS, ROUTINE W REFLEX MICROSCOPIC
Bilirubin Urine: NEGATIVE
Hgb urine dipstick: NEGATIVE
KETONES UR: NEGATIVE
LEUKOCYTES UA: NEGATIVE
NITRITE: NEGATIVE
SPECIFIC GRAVITY, URINE: 1.02 (ref 1.000–1.030)
TOTAL PROTEIN, URINE-UPE24: NEGATIVE
URINE GLUCOSE: NEGATIVE
UROBILINOGEN UA: 0.2 (ref 0.0–1.0)
pH: 6 (ref 5.0–8.0)

## 2015-09-18 LAB — CBC
HEMATOCRIT: 40.2 % (ref 36.0–46.0)
HEMOGLOBIN: 13.2 g/dL (ref 12.0–15.0)
MCHC: 32.9 g/dL (ref 30.0–36.0)
MCV: 88.9 fl (ref 78.0–100.0)
Platelets: 292 10*3/uL (ref 150.0–400.0)
RBC: 4.52 Mil/uL (ref 3.87–5.11)
RDW: 14.6 % (ref 11.5–15.5)
WBC: 4.7 10*3/uL (ref 4.0–10.5)

## 2015-09-18 LAB — HEMOGLOBIN A1C: Hgb A1c MFr Bld: 6 % (ref 4.6–6.5)

## 2015-09-18 LAB — VITAMIN D 25 HYDROXY (VIT D DEFICIENCY, FRACTURES): VITD: 15.44 ng/mL — AB (ref 30.00–100.00)

## 2015-09-18 LAB — TSH: TSH: 0.92 u[IU]/mL (ref 0.35–4.50)

## 2015-09-21 ENCOUNTER — Telehealth: Payer: Self-pay | Admitting: Physician Assistant

## 2015-09-21 MED ORDER — PHENTERMINE HCL 30 MG PO CAPS: 30.0000 mg | ORAL_CAPSULE | ORAL | Status: DC

## 2015-09-21 MED ORDER — VITAMIN D (ERGOCALCIFEROL) 1.25 MG (50000 UNIT) PO CAPS: 50000.0000 [IU] | ORAL_CAPSULE | ORAL | Status: DC

## 2015-09-21 NOTE — Telephone Encounter (Signed)
Vitamin D has been sent.  Will agree to phentermine short-term. I will fax over a prescription -- before I do, does she have any concerns of being pregnant? Cannot be on this medication if you are pregnant.

## 2015-09-21 NOTE — Telephone Encounter (Signed)
Called and spoke with the pt and informed her of the note below.  Pt verbalized understanding.  Pt stated that no she is not pregnant.  Rx printed and faxed to the pharmacy.  Confirmation received.//AB/CMA

## 2015-09-21 NOTE — Telephone Encounter (Signed)
-----   Message from Dorrene German, RN sent at 09/20/2015  8:43 AM EST ----- Pt notified of results and verbalized understanding. Ok to send in rx for Vitamin D to [facility] on [address]. Pt states she had stopped exercising d/t arthritis and swelling/pain in her knees. She is trying to get back on track. She would like a 1 month rx of Phentermine to help her get back on track w/ weight loss if appropriate...states she has taken this in the past.

## 2015-10-01 ENCOUNTER — Encounter: Payer: Self-pay | Admitting: Obstetrics & Gynecology

## 2015-10-01 ENCOUNTER — Ambulatory Visit (HOSPITAL_BASED_OUTPATIENT_CLINIC_OR_DEPARTMENT_OTHER)
Admission: RE | Admit: 2015-10-01 | Discharge: 2015-10-01 | Disposition: A | Discharge status: Home / Self Care | Payer: 59 | Source: Ambulatory Visit | Attending: Physician Assistant | Admitting: Physician Assistant

## 2015-10-01 ENCOUNTER — Ambulatory Visit (INDEPENDENT_AMBULATORY_CARE_PROVIDER_SITE_OTHER): Payer: 59 | Admitting: Obstetrics & Gynecology

## 2015-10-01 VITALS — BP 121/84 | HR 75 | Ht 67.0 in | Wt 230.0 lb

## 2015-10-01 DIAGNOSIS — Z1231 Encounter for screening mammogram for malignant neoplasm of breast: Secondary | ICD-10-CM | POA: Insufficient documentation

## 2015-10-01 DIAGNOSIS — Z01419 Encounter for gynecological examination (general) (routine) without abnormal findings: Secondary | ICD-10-CM | POA: Diagnosis not present

## 2015-10-01 DIAGNOSIS — B354 Tinea corporis: Secondary | ICD-10-CM

## 2015-10-01 DIAGNOSIS — Z1239 Encounter for other screening for malignant neoplasm of breast: Secondary | ICD-10-CM | POA: Diagnosis not present

## 2015-10-01 DIAGNOSIS — Z803 Family history of malignant neoplasm of breast: Secondary | ICD-10-CM | POA: Diagnosis not present

## 2015-10-01 DIAGNOSIS — Z1151 Encounter for screening for human papillomavirus (HPV): Secondary | ICD-10-CM | POA: Diagnosis not present

## 2015-10-01 DIAGNOSIS — Z124 Encounter for screening for malignant neoplasm of cervix: Secondary | ICD-10-CM | POA: Diagnosis not present

## 2015-10-01 MED ORDER — PRENATAL VITAMINS PLUS 27-1 MG PO TABS: 1.0000 | ORAL_TABLET | Freq: Every morning | ORAL | Status: DC

## 2015-10-01 MED ORDER — CLOTRIMAZOLE 1 % EX SOLN: 1.0000 "application " | Freq: Two times a day (BID) | CUTANEOUS | Status: DC

## 2015-10-01 NOTE — Addendum Note (Signed)
Addended by: Phill Myron on: 10/01/2015 09:25 AM   Modules accepted: Orders

## 2015-10-01 NOTE — Patient Instructions (Signed)
Mammogram A mammogram is an X-ray of the breasts that is done to check for abnormal changes. This procedure can screen for and detect any changes that may suggest breast cancer. A mammogram can also identify other changes and variations in the breast, such as:  Inflammation of the breast tissue (mastitis).  An infected area that contains a collection of pus (abscess).  A fluid-filled sac (cyst).  Fibrocystic changes. This is when breast tissue becomes denser, which can make the tissue feel rope-like or uneven under the skin.  Tumors that are not cancerous (benign). LET YOUR HEALTH CARE PROVIDER KNOW ABOUT:  Any allergies you have.  If you have breast implants.  If you have had previous breast disease, biopsy, or surgery.  If you are breastfeeding.  Any possibility that you could be pregnant, if this applies.  If you are younger than age 25.  If you have a family history of breast cancer. RISKS AND COMPLICATIONS Generally, this is a safe procedure. However, problems may occur, including:  Exposure to radiation. Radiation levels are very low with this test.  The results being misinterpreted.  The need for further tests.  The inability of the mammogram to detect certain cancers. BEFORE THE PROCEDURE  Schedule your test about 1-2 weeks after your menstrual period. This is usually when your breasts are the least tender.  If you have had a mammogram done at a different facility in the past, get the mammogram X-rays or have them sent to your current exam facility in order to compare them.  Wash your breasts and under your arms the day of the test.  Do not wear deodorants, perfumes, lotions, or powders anywhere on your body on the day of the test.  Remove any jewelry from your neck.  Wear clothes that you can change into and out of easily. PROCEDURE  You will undress from the waist up and put on a gown.  You will stand in front of the X-ray machine.  Each breast will  be placed between two plastic or glass plates. The plates will compress your breast for a few seconds. Try to stay as relaxed as possible during the procedure. This does not cause any harm to your breasts and any discomfort you feel will be very brief.  X-rays will be taken from different angles of each breast. The procedure may vary among health care providers and hospitals. AFTER THE PROCEDURE  The mammogram will be examined by a specialist (radiologist).  You may need to repeat certain parts of the test, depending on the quality of the images. This is commonly done if the radiologist needs a better view of the breast tissue.  Ask when your test results will be ready. Make sure you get your test results.  You may resume your normal activities.   This information is not intended to replace advice given to you by your health care provider. Make sure you discuss any questions you have with your health care provider.   Document Released: 06/27/2000 Document Revised: 03/21/2015 Document Reviewed: 09/08/2014 Elsevier Interactive Patient Education 2016 Elsevier Inc.  

## 2015-10-01 NOTE — Progress Notes (Signed)
Patient ID: Brenda Williams, female   DOB: Nov 11, 1980, 35 y.o.   MRN: [id number] Subjective:     Brenda Williams is a 35 y.o. female here for a routine exam.  Current complaints: irreg cycles.  Pt with PCOS. She reports cycles q 2 months. Cycles last 5-7 days. This is normal for pt. Mother breast cancer in her late 60's.  Pt interested in possibly getting pregnant later this year. On no contraception currently. Pt with no HSV outbreak ever.   Had a 'blood test' that came out positive in 2008 with an STI panel.  NO active GYN complaints.     Gynecologic History Patient's last menstrual period was 08/15/2015. Contraception: none Last Pap: 02/25/2011.had another PAP 2014 at [facility].  Normal Results were: normal Last mammogram: n/a.   Past Medical History  Diagnosis Date  . Herpes     never had outbreak. pos per blood, doesn't know which type  . Polycystic ovarian syndrome   . Abnormal Pap smear 2006    leep  . History of chicken pox   . SVD (spontaneous vaginal delivery)     x 1  . Arthritis     osteoarthritis   Past Surgical History  Procedure Laterality Date  . Knee surgery  1998    right knee  . Leep  2006  . Hysteroscopy  2009    uterine polyp  . Sphincterotomy  2010  . Ivf retrival  2010, 2011  . Uterine polyp removal    . Dilation and evacuation N/A 09/14/2012    Procedure: DILATATION AND EVACUATION;  Surgeon: Allena Katz, MD;  Location: [facility];  Service: Gynecology;  Laterality: N/A;     Obstetric History OB History  Gravida Para Term Preterm AB SAB TAB Ectopic Multiple Living  5 1 1  0 2 1 0 1 0 1    # Outcome Date GA Lbr Len/2nd Weight Sex Delivery Anes PTL Lv  5 Term 11/26/11 [redacted]w[redacted]d 11:47 / 03:00 7 lb 5.8 oz (3.34 kg) F Vag-Vacuum EPI  Y     Comments: No anomalies noted, caput noted  4 Ectopic 02/11/10             Comments: methotrexate  3 Gravida           2 Gravida              Comments: System Generated. Please review and update pregnancy  details.  1 SAB              Comments: System Generated. Please review and update pregnancy details.     Family History  Problem Relation Age of Onset  . Diabetes Maternal Grandmother   . Heart disease Maternal Grandmother   . Hypertension Maternal Grandfather   . Cancer Maternal Grandfather 70    bone, colon   . Diabetes Maternal Grandfather   . Diabetes Mother   . Cancer Mother 30    breast cancer  . Anesthesia problems Neg Hx   . Hypertension Father     The following portions of the patient's history were reviewed and updated as appropriate: allergies, current medications, past family history, past medical history, past social history, past surgical history and problem list.  Review of Systems Pertinent items are noted in HPI.    Objective:  BP 121/84 mmHg  Pulse 75  Ht 5\' 7"  (1.702 m)  Wt 230 lb (104.327 kg)  BMI 36.01 kg/m2  LMP 08/15/2015 General Appearance:  Alert, cooperative, no distress, appears stated age  Head:    Normocephalic, without obvious abnormality, atraumatic  Eyes:    conjunctiva/corneas clear, EOM's intact, both eyes  Ears:    Normal external ear canals, both ears  Nose:   Nares normal, septum midline, mucosa normal, no drainage    or sinus tenderness  Throat:   Lips, mucosa, and tongue normal; teeth and gums normal  Neck:   Supple, symmetrical, trachea midline, no adenopathy;    thyroid:  no enlargement/tenderness/nodules  Back:     Symmetric, no curvature, ROM normal, no CVA tenderness  Lungs:     Clear to auscultation bilaterally, respirations unlabored  Chest Wall:    No tenderness or deformity   Heart:    Regular rate and rhythm, S1 and S2 normal, no murmur, rub   or gallop  Breast Exam:    No tenderness, masses, or nipple abnormality; red rash with satellite lesions c/w tinea at bra lines around breasts bilaterally.  Abdomen:     Soft, non-tender, bowel sounds active all four quadrants,    no masses, no organomegaly  Genitalia:    Normal  female without lesion, discharge or tenderness; uterus- small, mobile, no adnexal masses noted     Extremities:   Extremities normal, atraumatic, no cyanosis or edema  Pulses:   2+ and symmetric all extremities  Skin:   Skin color, texture, turgor normal, no rashes or lesions (see exception above)      Assessment:    Healthy female exam.    Plan:   Brenda Williams was seen today for gynecologic exam.  Diagnoses and all orders for this visit:  Visit for routine gyn exam -     Prenatal Vit-Fe Fumarate-FA (PRENATAL VITAMINS PLUS) 27-1 MG TABS; Take 1 tablet by mouth every morning.  Tinea corporis -     clotrimazole (LOTRIMIN) 1 % external solution; Apply 1 application topically 2 (two) times daily.  Family history of breast cancer in female  mammogram today PAP with hrHPV obtained today   F/i in 1 year or sooner prn  Brenda Williams, M.D., Cherlynn June

## 2015-10-02 LAB — CYTOLOGY - PAP

## 2015-10-16 ENCOUNTER — Ambulatory Visit (HOSPITAL_COMMUNITY)
Admission: RE | Admit: 2015-10-16 | Discharge: 2015-10-16 | Disposition: A | Discharge status: Home / Self Care | Payer: 59 | Source: Ambulatory Visit | Attending: Emergency Medicine | Admitting: Emergency Medicine

## 2015-10-16 ENCOUNTER — Encounter: Payer: Self-pay | Admitting: Emergency Medicine

## 2015-10-16 ENCOUNTER — Ambulatory Visit (INDEPENDENT_AMBULATORY_CARE_PROVIDER_SITE_OTHER): Payer: 59 | Admitting: Emergency Medicine

## 2015-10-16 VITALS — BP 110/70 | HR 71 | Ht 67.0 in | Wt 231.0 lb

## 2015-10-16 DIAGNOSIS — R9389 Abnormal findings on diagnostic imaging of other specified body structures: Secondary | ICD-10-CM

## 2015-10-16 DIAGNOSIS — R938 Abnormal findings on diagnostic imaging of other specified body structures: Secondary | ICD-10-CM

## 2015-10-16 DIAGNOSIS — R0602 Shortness of breath: Secondary | ICD-10-CM | POA: Diagnosis not present

## 2015-10-16 LAB — PULMONARY FUNCTION TEST
DL/VA % PRED: 88 %
DL/VA: 4.58 ml/min/mmHg/L
DLCO unc % pred: 57 %
DLCO unc: 16.37 ml/min/mmHg
FEF 25-75 POST: 3.05 L/s
FEF 25-75 Pre: 2.87 L/sec
FEF2575-%CHANGE-POST: 6 %
FEF2575-%PRED-POST: 95 %
FEF2575-%PRED-PRE: 90 %
FEV1-%Change-Post: 0 %
FEV1-%Pred-Post: 85 %
FEV1-%Pred-Pre: 84 %
FEV1-PRE: 2.45 L
FEV1-Post: 2.46 L
FEV1FVC-%CHANGE-POST: 1 %
FEV1FVC-%Pred-Pre: 102 %
FEV6-%CHANGE-POST: 0 %
FEV6-%Pred-Post: 82 %
FEV6-%Pred-Pre: 83 %
FEV6-PRE: 2.86 L
FEV6-Post: 2.83 L
FEV6FVC-%Pred-Post: 102 %
FEV6FVC-%Pred-Pre: 102 %
FVC-%CHANGE-POST: 0 %
FVC-%Pred-Post: 81 %
FVC-%Pred-Pre: 82 %
FVC-Post: 2.83 L
FVC-Pre: 2.86 L
POST FEV1/FVC RATIO: 87 %
PRE FEV1/FVC RATIO: 86 %
Post FEV6/FVC ratio: 100 %
Pre FEV6/FVC Ratio: 100 %
RV % pred: 87 %
RV: 1.46 L
TLC % pred: 75 %
TLC: 4.13 L

## 2015-10-16 MED ORDER — ALBUTEROL SULFATE (2.5 MG/3ML) 0.083% IN NEBU
2.5000 mg | INHALATION_SOLUTION | Freq: Once | RESPIRATORY_TRACT | Status: AC
  Administered 2015-10-16: 2.5 mg via RESPIRATORY_TRACT

## 2015-10-16 NOTE — Patient Instructions (Addendum)
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  In order to prove this you would likely need a biopsy of your lung. The potential benefits of this would be that there are specific medications that can target LAM if it's present You should know that this disease or any cystic lung disease can increase her risk for a pneumothorax or a collapsed lung Follow with Dr Lamonte Sakai in 3 months or sooner if you have any problems.

## 2015-10-16 NOTE — Assessment & Plan Note (Signed)
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 We discussed the pros and cons of surgical biopsy today. Pulmonary function testing is grossly normal with only a slight decrease in her total lung capacity so there is nothing pushing her to have a biopsy at this time. All the same or maybe benefit to initiating therapy as soon as possible to prevent decline in lung function. She will consider the options. We will need to at least follow her with serial imaging and pulmonary function testing. Discussed with her the potential symptoms that she may experience if the process does become clinically significant. I also discussed with her the known increased risk for spontaneous pneumothorax.

## 2015-10-16 NOTE — Progress Notes (Signed)
Subjective:    Patient ID: Brenda Williams, female    DOB: [birth year], 35 y.o.   MRN: [id number]  HPI 35 yo woman, never smoker, little PMH. Referred for abnormal CT scan characterized by cystic changes, a single stable RML nodule. Original CT was in 3/'14 after she had anesthesia trouble following d&c. Denies dyspnea except when lying supine. No CP, no cough.   ROV 08/05/13 -- pt follows up for cystic changes and a RML nodule on CT scan chest. She had PFT today that show no AFL but some fatigability, decreased lung volumes and low DLCO that corrects for Va.  Her a1-AT is 110 (low normal) but genotype not reported. She was in the ED yesterday 1/22 for extreme fatigue, some mild SOB, no constitutional sx. She has otherwise been well. No significant cough, sometimes at night.   Acute OV 02/13/14 --  Complains of prod cough with yellow/clear mucus, head congestion w/ yellow mucus, PND, increased SOB, wheezing, fever up to 102 x 6 weeks.  Fevers have resolved. Cough is resolving. Still has some lingering clear mucus.  Reports symptoms are now improved x1-2weeks.   Cough was worse at night, keeping her up at night. That has improved.  Denies any hemoptysis, tightness, leg swelling, chest pain, n/v/d. No abx use.  Went to ER initially on 12/22/13 dx w/ viral syndrome. CXR neg.  Has known pulmonary nodule with planned CT chest 06/2014.   ROV 08/22/14 -- follow-up visit for a right middle lobe pulmonary nodule on CT scan of the chest as well as some mild evidence for emphysematous change versus cystic change on imaging. Spirometry has not confirmed any obstructive disease but she has had short windedness. A repeat CT scan on 06/30/14 showed that her 7 mm right middle lobe nodule is stable in size. She has been exercising more, has been working out more, some ups and downs   ROV 02/26/15 -- follow-up visit for lobe pulmonary nodule and some microcystic disease versus early emphysematous change on her CT scan  of the chest. Pulmonary function testing does not show overt obstruction. She reports that she has been doing fairly well, had a URI a couple weeks ago. She has some nocturnal cough, especially when she sleeps on her R side. She denies any GERD or PND. She had repeat CT scan of the chest performed on  12/26/14 that I personally reviewed. This showed that her right middle lobe pulmonary nodule is stable in size. It has now been unchanged for 2 years and 3 months. She should not need any repeat scans to follow this. She does still have innumerable small cystic lesions that are suggestive of early emphysematous change. She denies any significant dyspnea   Follow up : Microcystic disease and RUL nodule.  Pt returns for 5 month follow up . Pt had a follow up CT chest in Dec 2016 with stable 32mm RUL .  Showed numerous progressive thin walled cystic structures. ? Lymphangioleiomyomatosis .  Alpha 1 was normal with MM  Last PFT was in 2015 was nml .  Previous echo in 2014 was nml .  She gets sob with activity .some dry cough  Denies chest pain, orthopnea, edema or fever.    ROV 10/16/15 -- patient with a history of diffuse multilobar microcystic disease on CT scan of the chest. As part of her evaluation would perform pulmonary function testing today that I have personally reviewed. This showed normal spirometry, slightly decreased total lung capacity consistent with  mild restriction and a decreased diffusion capacity that corrects to the normal range when adjusted for alveolar volume. She has some episodes occasionally when she "breathes hard". Denies any chest pain. She has not had cough, no hemoptysis. She has gained some weight since last visit, has been less active. She is working on getting back in shape. Her VEGF level was normal 06/2015.   Review of Systems As per HPI     Objective:   Physical Exam Filed Vitals:   10/16/15 1538  BP: 110/70  Pulse: 71  Height: 5\' 7"  (1.702 m)  Weight: 231 lb  (104.781 kg)  SpO2: 99%   Body mass index is 36.17 kg/(m^2).    GEN: A/Ox3; pleasant , NAD, obese  HEENT:  OP clear, no lesions  NECK:  No bruit, no stridor, no lymphadenopathy  RESP  Clear bilaterally with no wheezes, crackles  CARD:  RRR, no m/r/g  , no peripheral edema, pulses intact, no cyanosis or clubbing.  Neuro: alert, no focal deficits noted.     CT chest 06/28/15  : No pleural fluid. Numerous, small thin walled cystic structures are identified throughout both lungs and appear progressive when compared with 06/22/2013. The right middle lobe pulmonary nodule measures 8 mm and is unchanged from previous exam, image 24 of series 3. No additional pulmonary nodules or masses Identified.   Assessment & Plan:    Abnormal CT scan, chest Innumerable thin-walled cavitary lesions most suggestive of Lymphangioleiomyomatosis (LAM). We discussed the pros and cons of surgical biopsy today. Pulmonary function testing is grossly normal with only a slight decrease in her total lung capacity so there is nothing pushing her to have a biopsy at this time. All the same or maybe benefit to initiating therapy as soon as possible to prevent decline in lung function. She will consider the options. We will need to at least follow her with serial imaging and pulmonary function testing. Discussed with her the potential symptoms that she may experience if the process does become clinically significant. I also discussed with her the known increased risk for spontaneous pneumothorax.

## 2015-10-17 ENCOUNTER — Telehealth: Payer: Self-pay | Admitting: Emergency Medicine

## 2015-10-17 DIAGNOSIS — J8481 Lymphangioleiomyomatosis: Secondary | ICD-10-CM

## 2015-10-17 NOTE — Telephone Encounter (Signed)
Spoke with patient, pt has several questions regarding her possible lung disease and having another baby. (lymphangioleiomyomatosis (LAM)) Pt would like know if it is safe to have another baby or if they need to wait and see what the biopsy results say. Pt states that her goal is to have the lung biopsy done by July 2017 - they have decided to move forward and have this done. Please advise Dr Lamonte Sakai. Thanks.

## 2015-10-22 NOTE — Telephone Encounter (Signed)
Referral has been made. Pt is aware. Nothing further was needed.

## 2015-10-22 NOTE — Telephone Encounter (Signed)
Pt has called back about this message. RB please advise. Thanks.

## 2015-10-22 NOTE — Telephone Encounter (Signed)
Please let her know that I am still looking into her question. So far I do not have any info to suggest that LAM is dangerous in pregnancy, but I am searching the literature. I will help her get a referral to discuss bx with [facility] after we review the literature together.

## 2015-10-22 NOTE — Telephone Encounter (Signed)
Spoke with pt. She would like to know if we can go ahead and just do the referral to [facility]?

## 2015-10-22 NOTE — Telephone Encounter (Signed)
Yes - send her to discuss VATS biopsy for microcystic disease, possible LAM

## 2015-10-30 ENCOUNTER — Telehealth: Payer: Self-pay | Admitting: Emergency Medicine

## 2015-10-30 NOTE — Telephone Encounter (Signed)
Spoke with teh patient. Reviewed the potential risks with LAM in pregnancy including PTX, chylothorax, premature birth. She voiced understanding. Will factor this info as she goes forward and decides whether to have more children.

## 2015-10-30 NOTE — Telephone Encounter (Signed)
Called on 4/17 and 4/18, LMOM Will try her again

## 2015-10-31 ENCOUNTER — Other Ambulatory Visit: Payer: Self-pay | Admitting: *Deleted

## 2015-10-31 ENCOUNTER — Encounter: Payer: Self-pay | Admitting: Cardiothoracic Surgery

## 2015-10-31 ENCOUNTER — Institutional Professional Consult (permissible substitution) (INDEPENDENT_AMBULATORY_CARE_PROVIDER_SITE_OTHER): Payer: 59 | Admitting: Cardiothoracic Surgery

## 2015-10-31 VITALS — BP 140/90 | HR 56 | Resp 20 | Ht 67.0 in | Wt 230.0 lb

## 2015-10-31 DIAGNOSIS — R911 Solitary pulmonary nodule: Secondary | ICD-10-CM

## 2015-10-31 DIAGNOSIS — J8481 Lymphangioleiomyomatosis: Secondary | ICD-10-CM

## 2015-10-31 NOTE — Progress Notes (Signed)
PCP is Leeanne Rio, PA-C Referring Provider is Collene Gobble, MD  Chief Complaint  Patient presents with  . Lung Lesion    Surgical eval, for possible VATS biopsy for microcystic disease, Chest CT 06/28/15  patient examined, serial chest CT scans and PFTs personally reviewed and counseled with patient  HPI: Very nice 35 year old obese AA female with clinical diagnosis of lymphangiomyomatosis having increased pulmonary symptoms and followed carefully by Dr. Lamonte Sakai.recent PFTs indicating FEV1 80% of predicted, DLCO 57% of predicted. The DLCO has significantly decreased since previous measurement 2015 [74% ]..patient has had no spontaneous pneumothorax. Last CT scan performed December 2016 shows progression of disease since previous CT scan the previous year Because of the patient's symptoms of dyspnea with exertion cough, a lung biopsy is been requested by her pulmonologist to help direct therapy.   The patient also has a7 mm right middle lobe nodule which has been present without change since the original CT scan in 2014. There is no significant mediastinal adenopathy. Last echocardiogram 2014 was normal. Renal function is normal. Past Medical History  Diagnosis Date  . Herpes     never had outbreak. pos per blood, doesn't know which type  . Polycystic ovarian syndrome   . Abnormal Pap smear 2006    leep  . History of chicken pox   . SVD (spontaneous vaginal delivery)     x 1  . Arthritis     osteoarthritis    Past Surgical History  Procedure Laterality Date  . Knee surgery  1998    right knee  . Leep  2006  . Hysteroscopy  2009    uterine polyp  . Sphincterotomy  2010  . Ivf retrival  2010, 2011  . Uterine polyp removal    . Dilation and evacuation N/A 09/14/2012    Procedure: DILATATION AND EVACUATION;  Surgeon: Allena Katz, MD;  Location: [facility];  Service: Gynecology;  Laterality: N/A;    Family History  Problem Relation Age of Onset  . Diabetes Maternal  Grandmother   . Heart disease Maternal Grandmother   . Hypertension Maternal Grandfather   . Cancer Maternal Grandfather 70    bone, colon   . Diabetes Maternal Grandfather   . Diabetes Mother   . Cancer Mother 34    breast cancer  . Anesthesia problems Neg Hx   . Hypertension Father     Social History Social History  Substance Use Topics  . Smoking status: Never Smoker   . Smokeless tobacco: Never Used  . Alcohol Use: No    Current Outpatient Prescriptions  Medication Sig Dispense Refill  . clotrimazole (LOTRIMIN) 1 % external solution Apply 1 application topically 2 (two) times daily. 30 mL 1  . ibuprofen (ADVIL,MOTRIN) 100 MG tablet Take 100 mg by mouth every 6 (six) hours as needed for fever. Reported on 10/01/2015    . phentermine 30 MG capsule Take 1 capsule (30 mg total) by mouth every morning. 30 capsule 1  . Prenatal Vit-Fe Fumarate-FA (PRENATAL VITAMINS PLUS) 27-1 MG TABS Take 1 tablet by mouth every morning. 30 tablet 12  . Vitamin D, Ergocalciferol, (DRISDOL) 50000 units CAPS capsule Take 1 capsule (50,000 Units total) by mouth every 7 (seven) days. 12 capsule 0   No current facility-administered medications for this visit.    No Known Allergies  Review of Systems the patient has tolerated general anesthesia previously without problem       Review of Systems :  [  y ] = yes, [  ] = no        General :  Weight gain [  yes ]    Weight loss  [   ]  Fatigue [ yes ]  Fever [  ]  Chills  [  ]                                Weakness  [  ]           HEENT    Headache [  ]  Dizziness [  ]  Blurred vision [  ] Glaucoma  [  ]                          Nosebleeds [  ] Painful or loose teeth [  ]        Cardiac :  Chest pain/ pressure [  ]  Resting SOB [  ] exertional SOB [place]  ]                        Orthopnea [  ]  Pedal edema  [  ]  Palpitations [  ] Syncope/presyncope [ ]                         Paroxysmal nocturnal dyspnea [  ]nonproductive cough         Pulmonary :  cough [ yes ]  wheezing [  ]  Hemoptysis [  ] Sputum [  ] Snoring [  ]                              Pneumoyesthorax [  ]  Sleep apnea [  ]        GI : Vomiting [  ]  Dysphagia [  ]  Melena  [  ]  Abdominal pain [  ] BRBPR [  ]              Heart burn [  ]  Constipation [  ] Diarrhea  [  ] Colonoscopy [   ]        GU : Hematuria [  ]  Dysuria [  ]  Nocturia [  ] UTI's [  ]        Vascular : Claudication [  ]  Rest pain [  ]  DVT [  ] Vein stripping [  ] leg ulcers [  ]                          TIA [  ] Stroke [  ]  Varicose veins [  ]        NEURO :  Headaches  [  ] Seizures [  ] Vision changes [  ] Paresthesias [  ]                                       Seizures [  ]        Musculoskeletal :  Arthritis [  ] Gout  [  ]  Back pain [  ]  Joint pain [  ]  Skin :  Rash [  ]  Melanoma [  ] Sores [  ]        Heme : Bleeding problems [  ]Clotting Disorders [  ] Anemia [  ]Blood Transfusion [ ]         Endocrine : Diabetes [  ] Heat or Cold intolerance [  ] Polyuria [  ]excessive thirst [ ]         Psych : Depression [  ]  Anxiety [  ]  Psych hospitalizations [  ] Memory change [  ]      Right-hand dominant                                        BP 140/90 mmHg  Pulse 56  Resp 20  Ht 5\' 7"  (1.702 m)  Wt 230 lb (104.327 kg)  BMI 36.01 kg/m2  SpO2 97%  LMP 09/14/2015 Physical Exam     Physical Exam  General: obese young AA female no acute distress HEENT: Normocephalic pupils equal , dentition adequate Neck: Supple without JVD, adenopathy, or bruit Chest: Clear to auscultation, symmetrical breath sounds, no rhonchi, no tenderness             or deformity Cardiovascular: Regular rate and rhythm, no murmur, no gallop, peripheral pulses             palpable in all extremities Abdomen:  Soft, nontender, no palpable mass or organomegaly Extremities: Warm, well-perfused, no clubbing cyanosis edema or tenderness,              no venous stasis changes of the legs Rectal/GU:  Deferred Neuro: Grossly non--focal and symmetrical throughout Skin: Clean and dry without rash or ulceration   Diagnostic Tests: CT scan December 2016 percent reviewed PFTs performed earlier this month personally reviewed showing decline in function since 2015  Impression: Clinical diagnosis of L.AM Tissue diagnosis is requested in order to help direct therapy by her pulmonologist I feel this is appropriate and I discussed the procedure of VATS for right lung biopsy with the patient and her husband including the details of surgery the indications alternatives and the risks. They understand and agree to proceed.  Plan:patient wishes to schedule surgery in June after her family returns from vacation. Right VATS for lung biopsy scheduled for June 16   Len Childs, MD [facility] [phone]

## 2015-12-26 ENCOUNTER — Ambulatory Visit (HOSPITAL_COMMUNITY)
Admission: RE | Admit: 2015-12-26 | Discharge: 2015-12-26 | Disposition: A | Discharge status: Home / Self Care | Payer: 59 | Source: Ambulatory Visit | Attending: Cardiothoracic Surgery | Admitting: Cardiothoracic Surgery

## 2015-12-26 ENCOUNTER — Other Ambulatory Visit: Payer: Self-pay

## 2015-12-26 ENCOUNTER — Encounter (HOSPITAL_COMMUNITY): Payer: Self-pay

## 2015-12-26 ENCOUNTER — Encounter (HOSPITAL_COMMUNITY)
Admission: RE | Admit: 2015-12-26 | Discharge: 2015-12-26 | Disposition: A | Discharge status: Home / Self Care | Payer: 59 | Source: Ambulatory Visit | Attending: Cardiothoracic Surgery | Admitting: Cardiothoracic Surgery

## 2015-12-26 VITALS — BP 118/77 | HR 77 | Temp 97.3°F | Resp 20 | Ht 67.5 in | Wt 233.9 lb

## 2015-12-26 DIAGNOSIS — Z01818 Encounter for other preprocedural examination: Secondary | ICD-10-CM | POA: Insufficient documentation

## 2015-12-26 DIAGNOSIS — J8481 Lymphangioleiomyomatosis: Secondary | ICD-10-CM | POA: Insufficient documentation

## 2015-12-26 HISTORY — DX: Asymptomatic varicose veins of unspecified lower extremity: I83.90

## 2015-12-26 HISTORY — DX: Adverse effect of unspecified anesthetic, initial encounter: T41.45XA

## 2015-12-26 HISTORY — DX: Gastro-esophageal reflux disease without esophagitis: K21.9

## 2015-12-26 HISTORY — DX: Anxiety disorder, unspecified: F41.9

## 2015-12-26 HISTORY — DX: Other complications of anesthesia, initial encounter: T88.59XA

## 2015-12-26 LAB — COMPREHENSIVE METABOLIC PANEL
ALT: 18 U/L (ref 14–54)
AST: 18 U/L (ref 15–41)
Albumin: 3.6 g/dL (ref 3.5–5.0)
Alkaline Phosphatase: 48 U/L (ref 38–126)
Anion gap: 6 (ref 5–15)
BUN: 10 mg/dL (ref 6–20)
CO2: 22 mmol/L (ref 22–32)
Calcium: 9.4 mg/dL (ref 8.9–10.3)
Chloride: 110 mmol/L (ref 101–111)
Creatinine, Ser: 0.54 mg/dL (ref 0.44–1.00)
GFR calc Af Amer: >60 mL/min (ref >60)
GFR calc non Af Amer: >60 mL/min (ref >60)
Glucose, Bld: 95 mg/dL (ref 65–99)
Potassium: 4 mmol/L (ref 3.5–5.1)
Sodium: 138 mmol/L (ref 135–145)
Total Bilirubin: 0.5 mg/dL (ref 0.3–1.2)
Total Protein: 6.9 g/dL (ref 6.5–8.1)

## 2015-12-26 LAB — CBC
HCT: 40.2 % (ref 36.0–46.0)
Hemoglobin: 12.9 g/dL (ref 12.0–15.0)
MCH: 28.1 pg (ref 26.0–34.0)
MCHC: 32.1 g/dL (ref 30.0–36.0)
MCV: 87.6 fL (ref 78.0–100.0)
Platelets: 270 10*3/uL (ref 150–400)
RBC: 4.59 MIL/uL (ref 3.87–5.11)
RDW: 14.4 % (ref 11.5–15.5)
WBC: 4.8 10*3/uL (ref 4.0–10.5)

## 2015-12-26 LAB — BLOOD GAS, ARTERIAL
Acid-Base Excess: 0.6 mmol/L (ref 0.0–2.0)
Bicarbonate: 24.5 mEq/L — ABNORMAL HIGH (ref 20.0–24.0)
FIO2: 0.21
O2 Saturation: 97 %
Patient temperature: 98.6
TCO2: 25.7 mmol/L (ref 0–100)
pCO2 arterial: 38 mmHg (ref 35.0–45.0)
pH, Arterial: 7.426 (ref 7.350–7.450)
pO2, Arterial: 91.7 mmHg (ref 80.0–100.0)

## 2015-12-26 LAB — URINALYSIS, ROUTINE W REFLEX MICROSCOPIC
Bilirubin Urine: NEGATIVE
Glucose, UA: NEGATIVE mg/dL
Hgb urine dipstick: NEGATIVE
Ketones, ur: NEGATIVE mg/dL
Leukocytes, UA: NEGATIVE
Nitrite: NEGATIVE
Protein, ur: NEGATIVE mg/dL
Specific Gravity, Urine: 1.015 (ref 1.005–1.030)
pH: 6 (ref 5.0–8.0)

## 2015-12-26 LAB — TYPE AND SCREEN
ABO/RH(D): A POS
Antibody Screen: NEGATIVE

## 2015-12-26 LAB — ABO/RH: ABO/RH(D): A POS

## 2015-12-26 LAB — HCG, SERUM, QUALITATIVE: Preg, Serum: NEGATIVE

## 2015-12-26 LAB — PROTIME-INR
INR: 1.01 (ref 0.00–1.49)
Prothrombin Time: 13.5 seconds (ref 11.6–15.2)

## 2015-12-26 LAB — SURGICAL PCR SCREEN
MRSA, PCR: NEGATIVE
Staphylococcus aureus: NEGATIVE

## 2015-12-26 LAB — APTT: aPTT: 25 seconds (ref 24–37)

## 2015-12-26 NOTE — Pre-Procedure Instructions (Signed)
Brenda Williams  12/26/2015      [facility] - [place], [place] - [address] [place] [address] Phone: [phone] Fax: [place] [address], [place] [address] Phone: [phone] Fax: [phone]    Your procedure is scheduled on   Friday  12/28/15  Report to [facility] Admitting at 1000 A.M.  Call this number if you have problems the morning of surgery:  [phone]   Remember:  Do not eat food or drink liquids after midnight.  Take these medicines the morning of surgery with A SIP OF WATER     NONE    (STOP IBUPROFEN/ ADVIL/ MOTRIN, ASPIRIN, GOODY POWDERS/ BC'S , HERBAL MEDICINES , VITAMINS ,PHENTERMINE)   Do not wear jewelry, make-up or nail polish.  Do not wear lotions, powders, or perfumes.  You may wear deodorant.  Do not shave 48 hours prior to surgery.  Men may shave face and neck.  Do not bring valuables to the hospital.  [facility] is not responsible for any belongings or valuables.  Contacts, dentures or bridgework may not be worn into surgery.  Leave your suitcase in the car.  After surgery it may be brought to your room.  For patients admitted to the hospital, discharge time will be determined by your treatment team.  Patients discharged the day of surgery will not be allowed to drive home.   Name and phone number of your driver:    Special instructions:  [facility] - Preparing for Surgery  Before surgery, you can play an important role.  Because skin is not sterile, your skin needs to be as free of germs as possible.  You can reduce the number of germs on you skin by washing with CHG (chlorahexidine gluconate) soap before surgery.  CHG is an antiseptic cleaner which kills germs and bonds with the skin to continue killing germs even after washing.  Please DO NOT use if you have an allergy to CHG or antibacterial  soaps.  If your skin becomes reddened/irritated stop using the CHG and inform your nurse when you arrive at Short Stay.  Do not shave (including legs and underarms) for at least 48 hours prior to the first CHG shower.  You may shave your face.  Please follow these instructions carefully:   1.  Shower with CHG Soap the night before surgery and the                                morning of Surgery.  2.  If you choose to wash your hair, wash your hair first as usual with your       normal shampoo.  3.  After you shampoo, rinse your hair and body thoroughly to remove the                      Shampoo.  4.  Use CHG as you would any other liquid soap.  You can apply chg directly       to the skin and wash gently with scrungie or a clean washcloth.  5.  Apply the CHG Soap to your body ONLY FROM THE NECK DOWN.        Do not use on open wounds or open sores.  Avoid contact with your eyes,  ears, mouth and genitals (private parts).  Wash genitals (private parts)       with your normal soap.  6.  Wash thoroughly, paying special attention to the area where your surgery        will be performed.  7.  Thoroughly rinse your body with warm water from the neck down.  8.  DO NOT shower/wash with your normal soap after using and rinsing off       the CHG Soap.  9.  Pat yourself dry with a clean towel.            10.  Wear clean pajamas.            11.  Place clean sheets on your bed the night of your first shower and do not        sleep with pets.  Day of Surgery  Do not apply any lotions/deoderants the morning of surgery.  Please wear clean clothes to the hospital/surgery center.    Please read over the following fact sheets that you were given. Pain Booklet, Coughing and Deep Breathing, Blood Transfusion Information, MRSA Information and Surgical Site Infection Prevention

## 2015-12-27 MED ORDER — DEXTROSE 5 % IV SOLN
1.5000 g | INTRAVENOUS | Status: AC
  Administered 2015-12-28: 1.5 g via INTRAVENOUS
  Filled 2015-12-27: qty 1.5

## 2015-12-27 NOTE — Progress Notes (Signed)
Anesthesia Chart Review: Patient is a 35 year old female scheduled for right VATS, lung biopsy on 12/28/15 by Dr. Prescott Gum. DX: Lymphangioleiomyomatosis.  Other history includes non-smoker, PCOS, varicose veins, exertional dyspnea, anxiety, GERD, headaches. PCP is Leeanne Rio, PA-C with [facility].  For anesthesia complications, she had witnessed "seizure" activity (full body jerking with disconjugate gaze, no bowel or bladder incontinence, no tongue biting) under anesthesia for dilatation and evacuation for missed abortion on 09/14/12. Procedure was done under MAC anesthesia (received midazolam, fentanyl, lidocaine, propofol, Zofran, cefazolin). She was also received Pitocin intraoperatively per surgeon. Head CT was negative. Chest CT was negative for PE (but with incidental finding of RML lung nodule, emphysema, and fatty liver) Neurology (Dr. Dorian Pod) was consulted. Etiology unclear. Out-patient neurology follow-up with EEG recommended. (I called patient this morning, and she does not think that she ever saw neurology after her hospitalization. She denied any recurrent "seizure" like activity.) Cardiology follow-up also recommended for PVCs, and pulmonology follow-up for lung nodule. She was discharged home that same day.   She was seen by cardiologist Dr. Sherren Mocha on 10/07/12 for evaluation of PVC's that were noted during EEG, so referral was recommended. There were no sustained arrhythmias noted. Her EKG was abnormal with anterolateral T wave abnormality, mildly prolonged QT, and PVCs. There were no associated "red flags" for prolonged QT syndrome. He was going to discuss with EP and order an echo. Echo was normal. There is no documentation in [facility] that I see indicating if EP had any additional recommendations, but patient said she was told to follow-up as needed if she developed any cardiac symptoms. Today she denied chest pain, palpitations, syncope.   Pulmonologist is Dr.  Baltazar Apo.   Meds include phentermine (denied use in at least a month), prenatal vitamin, vitamin D.  12/26/15 EKG (as interpreted by cardiologist Dr. Johnsie Cancel): SB at 59 bpm with sinus arrhythmia, non-specific T wave abnormality. No significant change when compared to 08/04/13 tracing. She has had inferolateral T wave inversion on prior EKGs dating back to 2014/2015.  10/06/12 Echo: Study Conclusions - Left ventricle: The cavity size was normal. Wall thickness was normal. The estimated ejection fraction was 60%. Wall motion was normal; there were no regional wall motion abnormalities. Left ventricular diastolic function parameters were normal for the patient's age. - Right ventricle: The cavity size was normal. Systolic function was normal.  12/26/15 CXR: IMPRESSION: No acute cardiopulmonary disease.  10/16/15 PFTs: FVC 2.86 (82%), FEV1 2.45 (84%), DLCOunc 16.37 (57%).  Preoperative labs noted. CBC, CMET, PT/PTT WNL. Serum pregnancy test was negative. A1c on 09/18/15 was 6.0.   I reviewed above history with anesthesiologist Dr. Linna Caprice including possible seizure under MAC anesthesia. Patient said she was not seen by neurology post-hospitalization, so it does not appear that an EEG was ever done. She has not had any known neurologic events since. Also no cardiac symptoms. Further evaluation on the of surgery, but it is anticipated that she can proceed as planned.  [facility] Short Stay Center/Anesthesiology Phone [phone] 12/27/2015 12:06 PM

## 2015-12-28 ENCOUNTER — Encounter (HOSPITAL_COMMUNITY)
Admission: RE | Disposition: A | Discharge status: Home / Self Care | Payer: Self-pay | Source: Ambulatory Visit | Attending: Cardiothoracic Surgery

## 2015-12-28 ENCOUNTER — Encounter (HOSPITAL_COMMUNITY): Payer: Self-pay | Admitting: Surgery

## 2015-12-28 ENCOUNTER — Inpatient Hospital Stay (HOSPITAL_COMMUNITY)
Admission: RE | Admit: 2015-12-28 | Discharge: 2015-12-31 | DRG: 167 | Disposition: A | Discharge status: Home / Self Care | Payer: 59 | Source: Ambulatory Visit | Attending: Cardiothoracic Surgery | Admitting: Cardiothoracic Surgery

## 2015-12-28 ENCOUNTER — Inpatient Hospital Stay (HOSPITAL_COMMUNITY): Payer: 59 | Admitting: Certified Registered Nurse Anesthetist

## 2015-12-28 ENCOUNTER — Inpatient Hospital Stay (HOSPITAL_COMMUNITY): Payer: 59

## 2015-12-28 ENCOUNTER — Inpatient Hospital Stay (HOSPITAL_COMMUNITY): Payer: 59 | Admitting: Vascular Surgery

## 2015-12-28 DIAGNOSIS — K219 Gastro-esophageal reflux disease without esophagitis: Secondary | ICD-10-CM | POA: Diagnosis present

## 2015-12-28 DIAGNOSIS — Z6836 Body mass index (BMI) 36.0-36.9, adult: Secondary | ICD-10-CM

## 2015-12-28 DIAGNOSIS — F419 Anxiety disorder, unspecified: Secondary | ICD-10-CM | POA: Diagnosis present

## 2015-12-28 DIAGNOSIS — E669 Obesity, unspecified: Secondary | ICD-10-CM | POA: Diagnosis present

## 2015-12-28 DIAGNOSIS — Z8249 Family history of ischemic heart disease and other diseases of the circulatory system: Secondary | ICD-10-CM

## 2015-12-28 DIAGNOSIS — I472 Ventricular tachycardia: Secondary | ICD-10-CM | POA: Diagnosis not present

## 2015-12-28 DIAGNOSIS — J939 Pneumothorax, unspecified: Secondary | ICD-10-CM

## 2015-12-28 DIAGNOSIS — B009 Herpesviral infection, unspecified: Secondary | ICD-10-CM | POA: Diagnosis present

## 2015-12-28 DIAGNOSIS — Z79899 Other long term (current) drug therapy: Secondary | ICD-10-CM

## 2015-12-28 DIAGNOSIS — Z833 Family history of diabetes mellitus: Secondary | ICD-10-CM

## 2015-12-28 DIAGNOSIS — R911 Solitary pulmonary nodule: Secondary | ICD-10-CM | POA: Diagnosis present

## 2015-12-28 DIAGNOSIS — E282 Polycystic ovarian syndrome: Secondary | ICD-10-CM | POA: Diagnosis present

## 2015-12-28 DIAGNOSIS — I739 Peripheral vascular disease, unspecified: Secondary | ICD-10-CM | POA: Diagnosis present

## 2015-12-28 DIAGNOSIS — Z9889 Other specified postprocedural states: Secondary | ICD-10-CM

## 2015-12-28 DIAGNOSIS — J8481 Lymphangioleiomyomatosis: Principal | ICD-10-CM

## 2015-12-28 HISTORY — PX: LUNG BIOPSY: SHX5088

## 2015-12-28 HISTORY — PX: VIDEO ASSISTED THORACOSCOPY: SHX5073

## 2015-12-28 LAB — GLUCOSE, CAPILLARY: Glucose-Capillary: 116 mg/dL — ABNORMAL HIGH (ref 65–99)

## 2015-12-28 SURGERY — VIDEO ASSISTED THORACOSCOPY
Anesthesia: General | Site: Chest | Laterality: Right

## 2015-12-28 MED ORDER — SUGAMMADEX SODIUM 200 MG/2ML IV SOLN
INTRAVENOUS | Status: AC
  Filled 2015-12-28: qty 2

## 2015-12-28 MED ORDER — FENTANYL 40 MCG/ML IV SOLN
INTRAVENOUS | Status: DC
  Administered 2015-12-28: 25 mL via INTRAVENOUS
  Administered 2015-12-29: 40 ug via INTRAVENOUS
  Administered 2015-12-29: 150 ug via INTRAVENOUS
  Administered 2015-12-29: 21.5 mL via INTRAVENOUS
  Administered 2015-12-29: 220 ug via INTRAVENOUS
  Administered 2015-12-29: 23.3 mL via INTRAVENOUS
  Administered 2015-12-29: 180 ug via INTRAVENOUS
  Administered 2015-12-30: 12 mL via INTRAVENOUS
  Administered 2015-12-30: 8.6 mL via INTRAVENOUS
  Administered 2015-12-30: 140 ug via INTRAVENOUS
  Administered 2015-12-30: 15 mL via INTRAVENOUS
  Administered 2015-12-30: 20 ug via INTRAVENOUS
  Filled 2015-12-28: qty 25

## 2015-12-28 MED ORDER — BISACODYL 5 MG PO TBEC
10.0000 mg | DELAYED_RELEASE_TABLET | Freq: Every day | ORAL | Status: DC
  Administered 2015-12-29 – 2015-12-30 (×2): 10 mg via ORAL
  Filled 2015-12-28 (×3): qty 2

## 2015-12-28 MED ORDER — PROMETHAZINE HCL 25 MG/ML IJ SOLN
6.2500 mg | INTRAMUSCULAR | Status: DC | PRN
Start: 2015-12-28 — End: 2015-12-28

## 2015-12-28 MED ORDER — FENTANYL CITRATE (PF) 250 MCG/5ML IJ SOLN
INTRAMUSCULAR | Status: AC
  Filled 2015-12-28: qty 5

## 2015-12-28 MED ORDER — PHENTERMINE HCL 30 MG PO CAPS: 30.0000 mg | ORAL_CAPSULE | ORAL | Status: DC

## 2015-12-28 MED ORDER — MIDAZOLAM HCL 5 MG/5ML IJ SOLN
INTRAMUSCULAR | Status: DC | PRN
  Administered 2015-12-28: 2 mg via INTRAVENOUS

## 2015-12-28 MED ORDER — LACTATED RINGERS IV SOLN
INTRAVENOUS | Status: DC | PRN
  Administered 2015-12-28: 13:00:00 via INTRAVENOUS

## 2015-12-28 MED ORDER — LIDOCAINE 2% (20 MG/ML) 5 ML SYRINGE
INTRAMUSCULAR | Status: AC
  Filled 2015-12-28: qty 5

## 2015-12-28 MED ORDER — OXYCODONE HCL 5 MG PO TABS
ORAL_TABLET | ORAL | Status: AC
  Filled 2015-12-28: qty 2

## 2015-12-28 MED ORDER — MIDAZOLAM HCL 2 MG/2ML IJ SOLN
INTRAMUSCULAR | Status: AC
  Filled 2015-12-28: qty 2

## 2015-12-28 MED ORDER — SUGAMMADEX SODIUM 200 MG/2ML IV SOLN
INTRAVENOUS | Status: DC | PRN
  Administered 2015-12-28: 212.2 mg via INTRAVENOUS

## 2015-12-28 MED ORDER — SENNOSIDES-DOCUSATE SODIUM 8.6-50 MG PO TABS
1.0000 | ORAL_TABLET | Freq: Every day | ORAL | Status: DC
  Administered 2015-12-28 – 2015-12-30 (×3): 1 via ORAL
  Filled 2015-12-28 (×3): qty 1

## 2015-12-28 MED ORDER — PHENYLEPHRINE 40 MCG/ML (10ML) SYRINGE FOR IV PUSH (FOR BLOOD PRESSURE SUPPORT)
PREFILLED_SYRINGE | INTRAVENOUS | Status: AC
  Filled 2015-12-28: qty 10

## 2015-12-28 MED ORDER — NALOXONE HCL 0.4 MG/ML IJ SOLN: 0.4000 mg | INTRAMUSCULAR | Status: DC | PRN

## 2015-12-28 MED ORDER — FENTANYL 40 MCG/ML IV SOLN
INTRAVENOUS | Status: AC
  Administered 2015-12-28: 170 ug via INTRAVENOUS
  Filled 2015-12-28: qty 25

## 2015-12-28 MED ORDER — CEFUROXIME SODIUM 1.5 G IJ SOLR
1.5000 g | Freq: Two times a day (BID) | INTRAMUSCULAR | Status: AC
  Administered 2015-12-28 – 2015-12-29 (×2): 1.5 g via INTRAVENOUS
  Filled 2015-12-28 (×2): qty 1.5

## 2015-12-28 MED ORDER — FENTANYL CITRATE (PF) 100 MCG/2ML IJ SOLN
INTRAMUSCULAR | Status: DC | PRN
  Administered 2015-12-28: 150 ug via INTRAVENOUS
  Administered 2015-12-28 (×2): 50 ug via INTRAVENOUS
  Administered 2015-12-28: 100 ug via INTRAVENOUS
  Administered 2015-12-28: 50 ug via INTRAVENOUS
  Administered 2015-12-28: 100 ug via INTRAVENOUS

## 2015-12-28 MED ORDER — 0.9 % SODIUM CHLORIDE (POUR BTL) OPTIME
TOPICAL | Status: DC | PRN
  Administered 2015-12-28 (×2): 1000 mL

## 2015-12-28 MED ORDER — KCL IN DEXTROSE-NACL 20-5-0.9 MEQ/L-%-% IV SOLN
INTRAVENOUS | Status: DC
  Administered 2015-12-28 – 2015-12-30 (×3): via INTRAVENOUS
  Filled 2015-12-28 (×6): qty 1000

## 2015-12-28 MED ORDER — INSULIN ASPART 100 UNIT/ML ~~LOC~~ SOLN
0.0000 [IU] | Freq: Four times a day (QID) | SUBCUTANEOUS | Status: DC
  Administered 2015-12-29 – 2015-12-30 (×3): 2 [IU] via SUBCUTANEOUS

## 2015-12-28 MED ORDER — PROPOFOL 10 MG/ML IV BOLUS
INTRAVENOUS | Status: DC | PRN
  Administered 2015-12-28: 200 mg via INTRAVENOUS

## 2015-12-28 MED ORDER — OXYCODONE HCL 5 MG PO TABS
10.0000 mg | ORAL_TABLET | Freq: Once | ORAL | Status: AC | PRN
  Administered 2015-12-28: 10 mg via ORAL

## 2015-12-28 MED ORDER — LIDOCAINE HCL (CARDIAC) 20 MG/ML IV SOLN
INTRAVENOUS | Status: DC | PRN
  Administered 2015-12-28: 100 mg via INTRAVENOUS

## 2015-12-28 MED ORDER — ROCURONIUM BROMIDE 50 MG/5ML IV SOLN
INTRAVENOUS | Status: AC
  Filled 2015-12-28: qty 2

## 2015-12-28 MED ORDER — ONDANSETRON HCL 4 MG/2ML IJ SOLN
INTRAMUSCULAR | Status: DC | PRN
  Administered 2015-12-28: 4 mg via INTRAVENOUS

## 2015-12-28 MED ORDER — HYDROMORPHONE HCL 1 MG/ML IJ SOLN
INTRAMUSCULAR | Status: AC
  Filled 2015-12-28: qty 1

## 2015-12-28 MED ORDER — ONDANSETRON HCL 4 MG/2ML IJ SOLN
4.0000 mg | Freq: Four times a day (QID) | INTRAMUSCULAR | Status: DC | PRN
  Filled 2015-12-28: qty 2

## 2015-12-28 MED ORDER — CLOTRIMAZOLE 1 % EX SOLN: 1.0000 "application " | Freq: Two times a day (BID) | CUTANEOUS | Status: DC

## 2015-12-28 MED ORDER — LACTATED RINGERS IV SOLN
INTRAVENOUS | Status: DC
  Administered 2015-12-28: 11:00:00 via INTRAVENOUS

## 2015-12-28 MED ORDER — DIPHENHYDRAMINE HCL 12.5 MG/5ML PO ELIX
12.5000 mg | ORAL_SOLUTION | Freq: Four times a day (QID) | ORAL | Status: DC | PRN
  Administered 2015-12-29: 12.5 mg via ORAL
  Filled 2015-12-28: qty 5

## 2015-12-28 MED ORDER — POTASSIUM CHLORIDE 10 MEQ/50ML IV SOLN: 10.0000 meq | Freq: Every day | INTRAVENOUS | Status: DC | PRN

## 2015-12-28 MED ORDER — ACETAMINOPHEN 500 MG PO TABS
1000.0000 mg | ORAL_TABLET | Freq: Four times a day (QID) | ORAL | Status: DC
  Administered 2015-12-29 – 2015-12-30 (×7): 1000 mg via ORAL
  Filled 2015-12-28 (×8): qty 2

## 2015-12-28 MED ORDER — DIPHENHYDRAMINE HCL 50 MG/ML IJ SOLN: 12.5000 mg | Freq: Four times a day (QID) | INTRAMUSCULAR | Status: DC | PRN

## 2015-12-28 MED ORDER — PRENATAL MULTIVITAMIN CH: 1.0000 | ORAL_TABLET | Freq: Every morning | ORAL | Status: DC

## 2015-12-28 MED ORDER — OXYCODONE HCL 5 MG PO TABS
5.0000 mg | ORAL_TABLET | ORAL | Status: DC | PRN
  Administered 2015-12-30 – 2015-12-31 (×2): 10 mg via ORAL
  Filled 2015-12-28 (×2): qty 2

## 2015-12-28 MED ORDER — SODIUM CHLORIDE 0.9% FLUSH: 9.0000 mL | INTRAVENOUS | Status: DC | PRN

## 2015-12-28 MED ORDER — PROPOFOL 10 MG/ML IV BOLUS
INTRAVENOUS | Status: AC
  Filled 2015-12-28: qty 20

## 2015-12-28 MED ORDER — ACETAMINOPHEN 160 MG/5ML PO SOLN: 1000.0000 mg | Freq: Four times a day (QID) | ORAL | Status: DC

## 2015-12-28 MED ORDER — ONDANSETRON HCL 4 MG/2ML IJ SOLN
INTRAMUSCULAR | Status: AC
  Filled 2015-12-28: qty 2

## 2015-12-28 MED ORDER — ROCURONIUM BROMIDE 100 MG/10ML IV SOLN
INTRAVENOUS | Status: DC | PRN
  Administered 2015-12-28: 60 mg via INTRAVENOUS
  Administered 2015-12-28: 10 mg via INTRAVENOUS

## 2015-12-28 MED ORDER — HYDROMORPHONE HCL 1 MG/ML IJ SOLN
0.2500 mg | INTRAMUSCULAR | Status: DC | PRN
  Administered 2015-12-28 (×4): 0.5 mg via INTRAVENOUS

## 2015-12-28 MED ORDER — TRAMADOL HCL 50 MG PO TABS: 50.0000 mg | ORAL_TABLET | Freq: Four times a day (QID) | ORAL | Status: DC | PRN

## 2015-12-28 MED ORDER — OXYCODONE HCL 5 MG/5ML PO SOLN: 10.0000 mg | Freq: Once | ORAL | Status: AC | PRN

## 2015-12-28 MED ORDER — ONDANSETRON HCL 4 MG/2ML IJ SOLN: 4.0000 mg | Freq: Four times a day (QID) | INTRAMUSCULAR | Status: DC | PRN

## 2015-12-28 SURGICAL SUPPLY — 73 items
ADH SKN CLS APL DERMABOND .7 (GAUZE/BANDAGES/DRESSINGS) ×1
BAG DECANTER FOR FLEXI CONT (MISCELLANEOUS) IMPLANT
BLADE SURG 11 STRL SS (BLADE) ×3 IMPLANT
CANISTER SUCTION 2500CC (MISCELLANEOUS) ×3 IMPLANT
CATH KIT ON Q 5IN SLV (PAIN MANAGEMENT) IMPLANT
CATH ROBINSON RED A/P 22FR (CATHETERS) IMPLANT
CATH THORACIC 28FR (CATHETERS) ×2 IMPLANT
CATH THORACIC 36FR (CATHETERS) IMPLANT
CATH THORACIC 36FR RT ANG (CATHETERS) IMPLANT
CLIP TI MEDIUM 6 (CLIP) ×2 IMPLANT
CONT SPEC 4OZ CLIKSEAL STRL BL (MISCELLANEOUS) ×6 IMPLANT
COVER SURGICAL LIGHT HANDLE (MISCELLANEOUS) ×6 IMPLANT
DERMABOND ADVANCED (GAUZE/BANDAGES/DRESSINGS) ×2
DERMABOND ADVANCED .7 DNX12 (GAUZE/BANDAGES/DRESSINGS) IMPLANT
DRAPE LAPAROSCOPIC ABDOMINAL (DRAPES) ×3 IMPLANT
DRAPE WARM FLUID 44X44 (DRAPE) ×1 IMPLANT
ELECT BLADE 4.0 EZ CLEAN MEGAD (MISCELLANEOUS) ×3
ELECT REM PT RETURN 9FT ADLT (ELECTROSURGICAL) ×3
ELECTRODE BLDE 4.0 EZ CLN MEGD (MISCELLANEOUS) IMPLANT
ELECTRODE REM PT RTRN 9FT ADLT (ELECTROSURGICAL) ×1 IMPLANT
GAUZE SPONGE 4X4 12PLY STRL (GAUZE/BANDAGES/DRESSINGS) ×3 IMPLANT
GLOVE BIO SURGEON STRL SZ 6.5 (GLOVE) ×1 IMPLANT
GLOVE BIO SURGEON STRL SZ7.5 (GLOVE) ×6 IMPLANT
GLOVE BIO SURGEONS STRL SZ 6.5 (GLOVE) ×1
GLOVE BIOGEL PI IND STRL 6.5 (GLOVE) IMPLANT
GLOVE BIOGEL PI IND STRL 7.0 (GLOVE) IMPLANT
GLOVE BIOGEL PI INDICATOR 6.5 (GLOVE) ×4
GLOVE BIOGEL PI INDICATOR 7.0 (GLOVE) ×4
GLOVE ECLIPSE 6.5 STRL STRAW (GLOVE) ×2 IMPLANT
GLOVE SURG SS PI 6.5 STRL IVOR (GLOVE) ×2 IMPLANT
GOWN STRL REUS W/ TWL LRG LVL3 (GOWN DISPOSABLE) ×3 IMPLANT
GOWN STRL REUS W/TWL LRG LVL3 (GOWN DISPOSABLE) ×12
KIT BASIN OR (CUSTOM PROCEDURE TRAY) ×3 IMPLANT
KIT ROOM TURNOVER OR (KITS) ×3 IMPLANT
KIT SUCTION CATH 14FR (SUCTIONS) ×3 IMPLANT
NS IRRIG 1000ML POUR BTL (IV SOLUTION) ×6 IMPLANT
PACK CHEST (CUSTOM PROCEDURE TRAY) ×3 IMPLANT
PAD ARMBOARD 7.5X6 YLW CONV (MISCELLANEOUS) ×6 IMPLANT
RELOAD GOLD ECHELON 45 (STAPLE) ×6 IMPLANT
SEALANT SURG COSEAL 4ML (VASCULAR PRODUCTS) IMPLANT
SOLUTION ANTI FOG 6CC (MISCELLANEOUS) ×3 IMPLANT
SPONGE GAUZE 4X4 12PLY STER LF (GAUZE/BANDAGES/DRESSINGS) ×2 IMPLANT
SPONGE TONSIL 1 RF SGL (DISPOSABLE) ×3 IMPLANT
STAPLER ECHELON POWERED (MISCELLANEOUS) ×2 IMPLANT
SUT CHROMIC 3 0 SH 27 (SUTURE) IMPLANT
SUT ETHILON 3 0 PS 1 (SUTURE) IMPLANT
SUT PROLENE 3 0 SH DA (SUTURE) IMPLANT
SUT PROLENE 4 0 RB 1 (SUTURE)
SUT PROLENE 4-0 RB1 .5 CRCL 36 (SUTURE) IMPLANT
SUT SILK  1 MH (SUTURE) ×4
SUT SILK 1 MH (SUTURE) ×2 IMPLANT
SUT SILK 2 0SH CR/8 30 (SUTURE) IMPLANT
SUT SILK 3 0SH CR/8 30 (SUTURE) IMPLANT
SUT VIC AB 1 CTX 18 (SUTURE) ×2 IMPLANT
SUT VIC AB 2 TP1 27 (SUTURE) IMPLANT
SUT VIC AB 2-0 CT2 18 VCP726D (SUTURE) IMPLANT
SUT VIC AB 2-0 CTX 27 (SUTURE) ×2 IMPLANT
SUT VIC AB 2-0 CTX 36 (SUTURE) ×4 IMPLANT
SUT VIC AB 2-0 UR6 27 (SUTURE) ×2 IMPLANT
SUT VIC AB 3-0 SH 18 (SUTURE) IMPLANT
SUT VIC AB 3-0 X1 27 (SUTURE) ×4 IMPLANT
SUT VICRYL 0 UR6 27IN ABS (SUTURE) ×4 IMPLANT
SUT VICRYL 2 TP 1 (SUTURE) IMPLANT
SWAB COLLECTION DEVICE MRSA (MISCELLANEOUS) IMPLANT
SYSTEM SAHARA CHEST DRAIN ATS (WOUND CARE) ×3 IMPLANT
TAPE CLOTH SURG 6X10 WHT LF (GAUZE/BANDAGES/DRESSINGS) ×2 IMPLANT
TIP APPLICATOR SPRAY EXTEND 16 (VASCULAR PRODUCTS) IMPLANT
TOWEL OR 17X24 6PK STRL BLUE (TOWEL DISPOSABLE) ×3 IMPLANT
TOWEL OR 17X26 10 PK STRL BLUE (TOWEL DISPOSABLE) ×4 IMPLANT
TRAP SPECIMEN MUCOUS 40CC (MISCELLANEOUS) IMPLANT
TRAY FOLEY CATH 16FRSI W/METER (SET/KITS/TRAYS/PACK) ×3 IMPLANT
TUBE ANAEROBIC SPECIMEN COL (MISCELLANEOUS) IMPLANT
WATER STERILE IRR 1000ML POUR (IV SOLUTION) ×4 IMPLANT

## 2015-12-28 NOTE — Progress Notes (Signed)
The patient was examined and preop studies reviewed. There has been no change from the prior exam and the patient is ready for surgery.  plan R VATS for lung biopsy on E [place]

## 2015-12-28 NOTE — Anesthesia Procedure Notes (Signed)
Procedure Name: Intubation Date/Time: 12/28/2015 1:40 PM Performed by: Rush Farmer E Pre-anesthesia Checklist: Patient identified, Timeout performed, Emergency Drugs available, Suction available and Patient being monitored Patient Re-evaluated:Patient Re-evaluated prior to inductionOxygen Delivery Method: Circle system utilized Preoxygenation: Pre-oxygenation with 100% oxygen Intubation Type: IV induction Ventilation: Mask ventilation without difficulty and Oral airway inserted - appropriate to patient size Laryngoscope Size: Mac and 4 Grade View: Grade I Endobronchial tube: Left and Double lumen EBT and 37 Fr Number of attempts: 1 Airway Equipment and Method: Stylet Placement Confirmation: ETT inserted through vocal cords under direct vision,  breath sounds checked- equal and bilateral and positive ETCO2 Secured at: 29 cm Tube secured with: Tape Dental Injury: Teeth and Oropharynx as per pre-operative assessment

## 2015-12-28 NOTE — Progress Notes (Signed)
The patient was examined and preop studies reviewed. There has been no change from the prior exam and the patient is ready for surgery.  [plan right VATS and lung bx on E [place]

## 2015-12-28 NOTE — Anesthesia Preprocedure Evaluation (Addendum)
Anesthesia Evaluation  Patient identified by MRN, date of birth, ID band Patient awake    Reviewed: Allergy & Precautions, H&P , NPO status , Patient's Chart, lab work & pertinent test results  History of Anesthesia Complications (+) history of anesthetic complications  Airway Mallampati: II  TM Distance: >3 FB Neck ROM: full    Dental no notable dental hx. (+) Teeth Intact   Pulmonary shortness of breath,    Pulmonary exam normal breath sounds clear to auscultation       Cardiovascular + Peripheral Vascular Disease  Normal cardiovascular exam Rhythm:regular Rate:Normal     Neuro/Psych  Headaches, Anxiety    GI/Hepatic Neg liver ROS, GERD  ,  Endo/Other  negative endocrine ROS  Renal/GU negative Renal ROS     Musculoskeletal  (+) Arthritis ,   Abdominal   Peds  Hematology negative hematology ROS (+)   Anesthesia Other Findings   Reproductive/Obstetrics negative OB ROS                            Anesthesia Physical Anesthesia Plan  ASA: II  Anesthesia Plan: General   Post-op Pain Management:    Induction: Intravenous  Airway Management Planned: Double Lumen EBT  Additional Equipment:   Intra-op Plan:   Post-operative Plan: Extubation in OR  Informed Consent: I have reviewed the patients History and Physical, chart, labs and discussed the procedure including the risks, benefits and alternatives for the proposed anesthesia with the patient or authorized representative who has indicated his/her understanding and acceptance.   Dental Advisory Given  Plan Discussed with: Anesthesiologist, CRNA and Surgeon  Anesthesia Plan Comments: (;no further neurological hx, please see Allison's note regarding her anesthesia history)        Anesthesia Quick Evaluation

## 2015-12-28 NOTE — Brief Op Note (Signed)
12/28/2015  3:06 PM  PATIENT:  Brenda Williams  35 y.o. female  PRE-OPERATIVE DIAGNOSIS:  Lymphangioleiomyomatosis   POST-OPERATIVE DIAGNOSIS:  Lymphangioleiomyomatosis   PROCEDURE:  RIGHT VIDEO ASSISTED THORACOSCOPY RIGHT UPPER LOBE and RIGHT LOWER LUNG BIOPSIES  SURGEON:  Surgeon(s) and Role:    * Ivin Poot, MD - Primary  PHYSICIAN ASSISTANT: Lars Pinks PA-C  ANESTHESIA:   general  EBL:  Total I/O In: 1300 [I.V.:1300] Out: -   BLOOD ADMINISTERED:none  DRAINS: Chest tube placed in the right pleural space   SPECIMEN:  Source of Specimen:  Right upper and right lower lung biopsies  DISPOSITION OF SPECIMEN:  PATHOLOGY  COUNTS CORRECT:  YES   DICTATION: .Dragon Dictation  PLAN OF CARE: Admit to inpatient   PATIENT DISPOSITION:  PACU - hemodynamically stable.   Delay start of Pharmacological VTE agent (>24hrs) due to surgical blood loss or risk of bleeding: yes

## 2015-12-28 NOTE — Anesthesia Postprocedure Evaluation (Signed)
Anesthesia Post Note  Patient: Brenda Williams  Procedure(s) Performed: Procedure(s) (LRB): RIGHT VIDEO ASSISTED THORACOSCOPY (Right) RIGHT LUNG BIOPSY (Right)  Patient location during evaluation: PACU Anesthesia Type: General Level of consciousness: awake and alert Pain management: pain level controlled Vital Signs Assessment: post-procedure vital signs reviewed and stable Respiratory status: spontaneous breathing, nonlabored ventilation, respiratory function stable and patient connected to nasal cannula oxygen Cardiovascular status: blood pressure returned to baseline and stable Postop Assessment: no signs of nausea or vomiting Anesthetic complications: no    Last Vitals:  Filed Vitals:   12/28/15 1523 12/28/15 1530  BP:    Pulse: 75 66  Temp:  36.1 C  Resp: 17 16    Last Pain:  Filed Vitals:   12/28/15 1539  PainSc: 6                  Zenaida Deed

## 2015-12-28 NOTE — Transfer of Care (Signed)
Immediate Anesthesia Transfer of Care Note  Patient: Brenda Williams  Procedure(s) Performed: Procedure(s): RIGHT VIDEO ASSISTED THORACOSCOPY (Right) RIGHT LUNG BIOPSY (Right)  Patient Location: PACU  Anesthesia Type:General  Level of Consciousness: awake, alert  and oriented  Airway & Oxygen Therapy: Patient Spontanous Breathing and Patient connected to nasal cannula oxygen  Post-op Assessment: Report given to RN and Post -op Vital signs reviewed and stable  Post vital signs: Reviewed and stable  Last Vitals:  Filed Vitals:   12/28/15 1010 12/28/15 1523  BP: 135/86   Pulse: 71 75  Temp: 36.8 C   Resp: 18 17    Last Pain:  Filed Vitals:   12/28/15 1525  PainSc: 6       Patients Stated Pain Goal: 3 ([id number])  Complications: No apparent anesthesia complications

## 2015-12-29 ENCOUNTER — Inpatient Hospital Stay (HOSPITAL_COMMUNITY): Payer: 59

## 2015-12-29 LAB — BASIC METABOLIC PANEL
ANION GAP: 5 (ref 5–15)
BUN: 6 mg/dL (ref 6–20)
CALCIUM: 8.7 mg/dL — AB (ref 8.9–10.3)
CHLORIDE: 105 mmol/L (ref 101–111)
CO2: 26 mmol/L (ref 22–32)
Creatinine, Ser: 0.57 mg/dL (ref 0.44–1.00)
GFR calc non Af Amer: >60 mL/min (ref >60)
Glucose, Bld: 121 mg/dL — ABNORMAL HIGH (ref 65–99)
Potassium: 4 mmol/L (ref 3.5–5.1)
Sodium: 136 mmol/L (ref 135–145)

## 2015-12-29 LAB — GLUCOSE, CAPILLARY
Glucose-Capillary: 135 mg/dL — ABNORMAL HIGH (ref 65–99)
Glucose-Capillary: 118 mg/dL — ABNORMAL HIGH (ref 65–99)
Glucose-Capillary: 93 mg/dL (ref 65–99)
Glucose-Capillary: 102 mg/dL — ABNORMAL HIGH (ref 65–99)

## 2015-12-29 LAB — POCT I-STAT 3, ART BLOOD GAS (G3+)
Bicarbonate: 25.8 mEq/L — ABNORMAL HIGH (ref 20.0–24.0)
O2 Saturation: 96 %
Patient temperature: 98.7
TCO2: 27 mmol/L (ref 0–100)
pCO2 arterial: 45.5 mmHg — ABNORMAL HIGH (ref 35.0–45.0)
pH, Arterial: 7.361 (ref 7.350–7.450)
pO2, Arterial: 88 mmHg (ref 80.0–100.0)

## 2015-12-29 LAB — CBC
HEMATOCRIT: 36.2 % (ref 36.0–46.0)
HEMOGLOBIN: 11.4 g/dL — AB (ref 12.0–15.0)
MCH: 27.9 pg (ref 26.0–34.0)
MCHC: 31.5 g/dL (ref 30.0–36.0)
MCV: 88.5 fL (ref 78.0–100.0)
Platelets: 272 10*3/uL (ref 150–400)
RBC: 4.09 MIL/uL (ref 3.87–5.11)
RDW: 14.4 % (ref 11.5–15.5)
WBC: 6.6 10*3/uL (ref 4.0–10.5)

## 2015-12-29 MED ORDER — CHLORHEXIDINE GLUCONATE 0.12 % MT SOLN
15.0000 mL | Freq: Two times a day (BID) | OROMUCOSAL | Status: DC
  Administered 2015-12-29 – 2015-12-30 (×4): 15 mL via OROMUCOSAL
  Filled 2015-12-29 (×4): qty 15

## 2015-12-29 MED ORDER — CETYLPYRIDINIUM CHLORIDE 0.05 % MT LIQD
7.0000 mL | Freq: Two times a day (BID) | OROMUCOSAL | Status: DC
  Administered 2015-12-29: 7 mL via OROMUCOSAL

## 2015-12-29 NOTE — Op Note (Signed)
[name], Brenda Williams              ACCOUNT NO.:  [id number]  MEDICAL RECORD NO.:  [id number]  LOCATION:  2S10C                        FACILITY:  [facility]  PHYSICIAN:  Ivin Poot, M.D.  DATE OF BIRTH:  [birth year]  DATE OF PROCEDURE:  12/28/2015 DATE OF DISCHARGE:                              OPERATIVE REPORT   OPERATION:  Right VATS (video-assisted thoracoscopic surgery) with lung biopsy - right lower lobe and right upper lobe.  SURGEON:  Ivin Poot, M.D.  ASSISTANT:  Lars Pinks, PA-C.  ANESTHESIA:  General.  PREOPERATIVE DIAGNOSIS:  Lymphangioleiomyomatosis of the lung.  POSTOPERATIVE DIAGNOSIS:  Lymphangioleiomyomatosis of the lung.  CLINICAL NOTE:  The patient is a 35 year old female with progressive pulmonary symptoms followed by Dr. Lamonte Sakai of Pulmonary Medicine.  She has a clinical diagnosis of LAM.  Dr. Lamonte Sakai requested a lung biopsy to document the diagnosis before starting immunosuppressive therapy.  I discussed the procedure in detail with the patient including the use of general anesthesia and location of the surgical incisions as well as the expected postoperative recovery.  I discussed the risks including the risks of air leak, bleeding, infection, ventilator dependence and postoperative pain.  She demonstrated her understanding and agreed to proceed with surgery.  OPERATIVE PROCEDURE:  The patient was brought to the operating room and placed supine on the operating table where general anesthesia was induced.  A double-lumen endotracheal tube was positioned by the Anesthesia team.  The patient was then rolled to position right side up. Right chest was prepped and draped as a sterile field.  A proper time- out was performed.  Small 1-inch incisions were made around the periphery of the right thorax, specifically below the tip of the scapula, the anterior axillary line and posterior to the scapula.  The VATS camera was inserted.  The lung was  inspected.  The lung had spotted erythematous areas, but no adhesions.  No significant blebs were noted. Using the VATS instruments and staple devices, wedge resections of the right lower lobe and right upper lobe were taken and submitted for Pathology.  The staple lines were intact.  A 28-French chest tube was placed and directed to the apex.  The lungs were reinflated under direct vision.  The camera was removed.  The incision was then closed in layers using Vicryl.  The chest tube was connected to an underwater seal Pleur-evac system.  The patient was then rolled supine and extubated.  The patient was to be taken to the recovery room for further observation.     Ivin Poot, M.D.     PV/MEDQ  D:  12/28/2015  T:  12/29/2015  Job:  [id number]  cc:   Collene Gobble, MD

## 2015-12-29 NOTE — Progress Notes (Signed)
Replaced Fentanyl PCA Medication. Wasted 80ml from previous syringe with Human resources officer.

## 2015-12-29 NOTE — Progress Notes (Signed)
BP cuff not correlating with Aline

## 2015-12-29 NOTE — Progress Notes (Signed)
Patient ID: Brenda Williams, female   DOB: [birth year], 35 y.o.   MRN: [id number] SICU Evening Rounds:  Hemodynamically stable sats 100%, Urine output ok CT output low

## 2015-12-29 NOTE — Progress Notes (Signed)
1 Day Post-Op Procedure(s) (LRB): RIGHT VIDEO ASSISTED THORACOSCOPY (Right) RIGHT LUNG BIOPSY (Right) Subjective:  sore  Objective: Vital signs in last 24 hours: Temp:  [97 F (36.1 C)-98.2 F (36.8 C)] 98 F (36.7 C) (06/17 0831) Pulse Rate:  [46-75] 52 (06/17 0900) Cardiac Rhythm:  [-] Normal sinus rhythm;Sinus bradycardia (06/17 0900) Resp:  [9-21] 18 (06/17 0900) BP: (65-135)/(24-95) 86/57 mmHg (06/17 0900) SpO2:  [97 %-100 %] 100 % (06/17 0900) Arterial Line BP: (103-137)/(44-71) 111/58 mmHg (06/17 0900) Weight:  [106.096 kg (233 lb 14.4 oz)] 106.096 kg (233 lb 14.4 oz) (06/16 1010)  Hemodynamic parameters for last 24 hours:    Intake/Output from previous day: 06/16 0701 - 06/17 0700 In: [id number] [P.O.:1020; I.V.:3000; IV Piggyback:50] Out: [id number] [Urine:1375; Blood:10; Chest Tube:130] Intake/Output this shift: Total I/O In: 200 [I.V.:200] Out: 50 [Chest Tube:50]  General appearance: alert and cooperative Neurologic: intact Heart: regular rate and rhythm, S1, S2 normal, no murmur, click, rub or gallop Lungs: clear to auscultation bilaterally Wound: dressings dry chest tube with minimal drainage and no air leak  Lab Results:  Recent Labs  12/26/15 1555 12/29/15 0415  WBC 4.8 6.6  HGB 12.9 11.4*  HCT 40.2 36.2  PLT 270 272   BMET:  Recent Labs  12/26/15 1555 12/29/15 0415  NA 138 136  K 4.0 4.0  CL 110 105  CO2 22 26  GLUCOSE 95 121*  BUN 10 6  CREATININE 0.54 0.57  CALCIUM 9.4 8.7*    PT/INR:  Recent Labs  12/26/15 1555  LABPROT 13.5  INR 1.01   ABG    Component Value Date/Time   PHART 7.361 12/29/2015 0354   HCO3 25.8* 12/29/2015 0354   TCO2 27 12/29/2015 0354   O2SAT 96.0 12/29/2015 0354   CBG (last 3)   Recent Labs  12/28/15 1940 12/29/15 0001 12/29/15 0616  GLUCAP 116* 135* 118*    Assessment/Plan: S/P Procedure(s) (LRB): RIGHT VIDEO ASSISTED THORACOSCOPY (Right) RIGHT LUNG BIOPSY (Right)  Hemodynamically stable  CT to  water seal  Decrease IVF to 50 cc/hr  IS, ambulation.   LOS: 1 day    Gaye Pollack 12/29/2015

## 2015-12-30 ENCOUNTER — Inpatient Hospital Stay (HOSPITAL_COMMUNITY): Payer: 59

## 2015-12-30 LAB — COMPREHENSIVE METABOLIC PANEL
ALK PHOS: 44 U/L (ref 38–126)
ALT: 17 U/L (ref 14–54)
ANION GAP: 5 (ref 5–15)
AST: 17 U/L (ref 15–41)
Albumin: 3 g/dL — ABNORMAL LOW (ref 3.5–5.0)
BILIRUBIN TOTAL: 0.5 mg/dL (ref 0.3–1.2)
BUN: 6 mg/dL (ref 6–20)
CALCIUM: 8.7 mg/dL — AB (ref 8.9–10.3)
CO2: 27 mmol/L (ref 22–32)
CREATININE: 0.66 mg/dL (ref 0.44–1.00)
Chloride: 106 mmol/L (ref 101–111)
GFR calc non Af Amer: >60 mL/min (ref >60)
Glucose, Bld: 100 mg/dL — ABNORMAL HIGH (ref 65–99)
Potassium: 3.7 mmol/L (ref 3.5–5.1)
Sodium: 138 mmol/L (ref 135–145)
TOTAL PROTEIN: 6.3 g/dL — AB (ref 6.5–8.1)

## 2015-12-30 LAB — CBC
HEMATOCRIT: 36.3 % (ref 36.0–46.0)
HEMOGLOBIN: 11.3 g/dL — AB (ref 12.0–15.0)
MCH: 28.1 pg (ref 26.0–34.0)
MCHC: 31.1 g/dL (ref 30.0–36.0)
MCV: 90.3 fL (ref 78.0–100.0)
Platelets: 255 10*3/uL (ref 150–400)
RBC: 4.02 MIL/uL (ref 3.87–5.11)
RDW: 14.5 % (ref 11.5–15.5)
WBC: 5.9 10*3/uL (ref 4.0–10.5)

## 2015-12-30 LAB — GLUCOSE, CAPILLARY
Glucose-Capillary: 124 mg/dL — ABNORMAL HIGH (ref 65–99)
Glucose-Capillary: 136 mg/dL — ABNORMAL HIGH (ref 65–99)
Glucose-Capillary: 84 mg/dL (ref 65–99)

## 2015-12-30 MED ORDER — DIPHENHYDRAMINE HCL 25 MG PO CAPS
25.0000 mg | ORAL_CAPSULE | Freq: Four times a day (QID) | ORAL | Status: DC | PRN
  Administered 2015-12-30 – 2015-12-31 (×2): 25 mg via ORAL
  Filled 2015-12-30 (×2): qty 1

## 2015-12-30 NOTE — Progress Notes (Signed)
2 Days Post-Op Procedure(s) (LRB): RIGHT VIDEO ASSISTED THORACOSCOPY (Right) RIGHT LUNG BIOPSY (Right) Subjective:  No complaints  Objective: Vital signs in last 24 hours: Temp:  [97.3 F (36.3 C)-98.3 F (36.8 C)] 98.3 F (36.8 C) (06/18 0827) Pulse Rate:  [54-81] 77 (06/18 1100) Cardiac Rhythm:  [-] Normal sinus rhythm (06/18 0700) Resp:  [12-20] 18 (06/18 1100) BP: (90-119)/(54-101) 116/73 mmHg (06/18 1100) SpO2:  [93 %-100 %] 95 % (06/18 1100) Arterial Line BP: (93-157)/(50-93) 102/50 mmHg (06/18 0500) Weight:  [108.5 kg (239 lb 3.2 oz)] 108.5 kg (239 lb 3.2 oz) (06/18 0500)  Hemodynamic parameters for last 24 hours:    Intake/Output from previous day: 06/17 0701 - 06/18 0700 In: 1540 [P.O.:240; I.V.:1300] Out: 2130 [Urine:1820; Chest Tube:310] Intake/Output this shift: Total I/O In: 200 [I.V.:200] Out: 260 [Urine:250; Chest Tube:10]  General appearance: alert and cooperative Heart: regular rate and rhythm, S1, S2 normal, no murmur, click, rub or gallop Lungs: clear to auscultation bilaterally chest tube output low and no air leak  Lab Results:  Recent Labs  12/29/15 0415 12/30/15 0320  WBC 6.6 5.9  HGB 11.4* 11.3*  HCT 36.2 36.3  PLT 272 255   BMET:  Recent Labs  12/29/15 0415 12/30/15 0320  NA 136 138  K 4.0 3.7  CL 105 106  CO2 26 27  GLUCOSE 121* 100*  BUN 6 6  CREATININE 0.57 0.66  CALCIUM 8.7* 8.7*    PT/INR: No results for input(s): LABPROT, INR in the last 72 hours. ABG    Component Value Date/Time   PHART 7.361 12/29/2015 0354   HCO3 25.8* 12/29/2015 0354   TCO2 27 12/29/2015 0354   O2SAT 96.0 12/29/2015 0354   CBG (last 3)   Recent Labs  12/29/15 1235 12/29/15 1831 12/29/15 2336  GLUCAP 93 102* 136*   CLINICAL DATA: Status post VATS lung biopsy.  EXAM: PORTABLE CHEST 1 VIEW  COMPARISON: 12/29/2015.  FINDINGS: Low lung volumes. Accentuated cardiomediastinal silhouette. RIGHT chest tube good position. Mild  subcutaneous emphysema.  There may be a tiny RIGHT basilar pneumothorax. No apical pneumothorax is seen. Mild vascular congestion. Linear densities in the RIGHT mid and lower lung zone could represent the biopsy sites.  IMPRESSION: Stable chest with low lung volumes. RIGHT chest tube good position. Tiny RIGHT basilar pneumothorax (arrow) not excluded.   Electronically Signed  By: Staci Righter M.D.  On: 12/30/2015 08:59  Assessment/Plan: S/P Procedure(s) (LRB): RIGHT VIDEO ASSISTED THORACOSCOPY (Right) RIGHT LUNG BIOPSY (Right)  CXR looks fine and no air leak. Remove chest tube and transfer to 2W. Continue IS and ambulation Plan home tomorrow.   LOS: 2 days    Gaye Pollack 12/30/2015

## 2015-12-30 NOTE — Progress Notes (Signed)
Patient arrived the unit on a wheelchair, assessment completed see flowsheet, placed on tele, ccmd notified  patient oriented to room and staff, bed in lowest position, call light and PCA bottom within reach will continue to monitor

## 2015-12-30 NOTE — Progress Notes (Signed)
Fentanyl PCA discontinued as ordered, 22ml of fentanyl PCA injection wasted  in the sink by this RN and Doretha Sou, RN

## 2015-12-31 ENCOUNTER — Inpatient Hospital Stay (HOSPITAL_COMMUNITY): Payer: 59

## 2015-12-31 ENCOUNTER — Encounter (HOSPITAL_COMMUNITY): Payer: Self-pay | Admitting: Cardiothoracic Surgery

## 2015-12-31 LAB — GLUCOSE, CAPILLARY: Glucose-Capillary: 116 mg/dL — ABNORMAL HIGH (ref 65–99)

## 2015-12-31 MED ORDER — OXYCODONE HCL 5 MG PO TABS: 5.0000 mg | ORAL_TABLET | ORAL | Status: DC | PRN

## 2015-12-31 NOTE — Progress Notes (Signed)
Pt is having non sustain V-tach. Pt is asymptomatic. We'll continue to monitor.

## 2015-12-31 NOTE — Progress Notes (Addendum)
      [address]       [place] [address]             [phone]       3 Days Post-Op Procedure(s) (LRB): RIGHT VIDEO ASSISTED THORACOSCOPY (Right) RIGHT LUNG BIOPSY (Right)  Subjective: Patient eating breakfast without complaints.  Objective: Vital signs in last 24 hours: Temp:  [98.1 F (36.7 C)-98.4 F (36.9 C)] 98.4 F (36.9 C) (06/18 2029) Pulse Rate:  [62-77] 75 (06/18 2029) Cardiac Rhythm:  [-] Normal sinus rhythm (06/19 0700) Resp:  [12-18] 18 (06/18 2029) BP: (103-139)/(63-90) 115/63 mmHg (06/18 2029) SpO2:  [95 %-100 %] 95 % (06/18 2029)     Intake/Output from previous day: 06/18 0701 - 06/19 0700 In: 1100 [P.O.:700; I.V.:400] Out: 690 [Urine:680; Chest Tube:10]   Physical Exam:  Cardiovascular: RRR Pulmonary: Mostly clear Abdomen: Soft, non tender, bowel sounds present. Extremities: SCDs in place Wounds: Clean and dry.  No erythema or signs of infection.   Lab Results: CBC: Recent Labs  12/29/15 0415 12/30/15 0320  WBC 6.6 5.9  HGB 11.4* 11.3*  HCT 36.2 36.3  PLT 272 255   BMET:  Recent Labs  12/29/15 0415 12/30/15 0320  NA 136 138  K 4.0 3.7  CL 105 106  CO2 26 27  GLUCOSE 121* 100*  BUN 6 6  CREATININE 0.57 0.66  CALCIUM 8.7* 8.7*    PT/INR: No results for input(s): LABPROT, INR in the last 72 hours. ABG:  INR: Will add last result for INR, ABG once components are confirmed Will add last 4 CBG results once components are confirmed  Assessment/Plan:  1. CV - SR in the 70's this am. Had a run of NSVT after midnight. 2.  Pulmonary - On room air. CXR shows no pneumothorax, minor subcutaneous emphysema, and cardiomegaly. 3. Discharge later today    ZIMMERMAN,DONIELLE MPA-C 12/31/2015,7:57 AM   cxr this am clear  patient examined and medical record reviewed,agree with above note. Tharon Aquas Trigt III 12/31/2015

## 2015-12-31 NOTE — Care Management Note (Addendum)
Case Management Note Marvetta Gibbons RN, BSN Unit 2W-Case Manager [phone]  Patient Details  Name: Brenda Williams MRN: [id number] Date of Birth: 07-14-1981  Subjective/Objective:    Pt admitted s/p VATS               Action/Plan: PTA pt lived at home- plan to return home- pt independent  Expected Discharge Date:  12/31/15               Expected Discharge Plan:  Home/Self Care  In-House Referral:     Discharge planning Services  CM Consult  Post Acute Care Choice:   DME Choice offered to:     DME Arranged:   rolling walker DME Agency:   [facility]  HH Arranged:    [place] Agency:     Status of Service:  Completed, signed off  Medicare Important Message Given:    Date Medicare IM Given:    Medicare IM give by:    Date Additional Medicare IM Given:    Additional Medicare Important Message give by:     If discussed at [place] of Stay Meetings, dates discussed:    Additional Comments:  Dawayne Patricia, RN 12/31/2015, 10:31 AM

## 2015-12-31 NOTE — Discharge Instructions (Signed)
,   Care After °Refer to this sheet in the next few weeks. These instructions provide you with information on caring for yourself after your procedure. Your caregiver may also give you more specific instructions. Your procedure has been planned according to current medical practices, but problems sometimes occur. Call your caregiver if you have any problems or questions after your procedure. °HOME CARE INSTRUCTIONS  °· Only take over-the-counter or prescription medications as directed. °· Only take pain medications (narcotics) as directed. °· Do not drive until your caregiver approves. Driving while taking narcotics or soon after surgery can be dangerous, so discuss the specific timing with your caregiver. °· Avoid activities that use your chest muscles, such as lifting heavy objects, for at least 3-4 weeks.   °· Take deep breaths to expand the lungs and to protect against pneumonia. °· Do breathing exercises as directed by your caregiver. If you were given an incentive spirometer to help with breathing, use it as directed. °· You may resume a normal diet and activities when you feel you are able to or as directed. °· Do not take a bath until your caregiver says it is OK. Use the shower instead.   °· Keep the bandage (dressing) covering the area where the chest tube was inserted (incision site) dry for 48 hours. After 48 hours, remove the dressing unless there is new drainage. °· Remove dressings as directed by your caregiver. °· Change dressings if necessary or as directed. °· Keep all follow-up appointments. It is important for you to see your caregiver after surgery to discuss appropriate follow-up care and surveillance, if it is necessary. °SEEK MEDICAL CARE: °· You feel excessive or increasing pain at an incision site. °· You notice bleeding, skin irritation, drainage, swelling, or redness at an incision site. °· There is a bad smell coming from an incision or dressing. °· It feels like your heart is fluttering  or beating rapidly. °· Your pain medication does not relieve your pain. °SEEK IMMEDIATE MEDICAL CARE IF:  °· You have a fever.   °· You have chest pain.  °· You have a rash. °· You have shortness of breath. °· You have trouble breathing.   °· You feel weak, lightheaded, dizzy, or faint.   °MAKE SURE YOU:  °· Understand these instructions.   °· Will watch your condition.   °· Will get help right away if you are not doing well or get worse. °  °This information is not intended to replace advice given to you by your health care provider. Make sure you discuss any questions you have with your health care provider. °  °Document Released: 10/25/2012 Document Revised: 07/21/2014 Document Reviewed: 10/25/2012 °Elsevier Interactive Patient Education ©2016 [employer] Inc[employer] ° °

## 2015-12-31 NOTE — Discharge Summary (Signed)
Physician Discharge Summary  Patient ID: Brenda Williams MRN: [id number] DOB/AGE: 10/22/1980 35 y.o.  Admit date: 12/28/2015 Discharge date: 12/31/2015  Admission Diagnoses:  Patient Active Problem List   Diagnosis Date Noted  . Breast cancer screening 09/17/2015  . Bilateral knee pain 08/20/2015  . Obesity 01/12/2015  . Chronic fatigue 12/01/2014  . Abnormal CT scan, chest 06/28/2013  . Solitary pulmonary nodule 06/28/2013  . Other ectopic pregnancy without intrauterine pregnancy 05/12/2011  . Cervix abnormality 05/12/2011  . Polycystic ovaries 05/12/2011  . Female infertility of unspecified origin 05/12/2011  . Nausea alone 05/12/2011   Discharge Diagnoses:   Patient Active Problem List   Diagnosis Date Noted  . Lymphangioleiomyomatosis ([place]) 12/28/2015  . Breast cancer screening 09/17/2015  . Bilateral knee pain 08/20/2015  . Obesity 01/12/2015  . Chronic fatigue 12/01/2014  . Abnormal CT scan, chest 06/28/2013  . Solitary pulmonary nodule 06/28/2013  . Other ectopic pregnancy without intrauterine pregnancy 05/12/2011  . Cervix abnormality 05/12/2011  . Polycystic ovaries 05/12/2011  . Female infertility of unspecified origin 05/12/2011  . Nausea alone 05/12/2011   Discharged Condition: good  History of Present Illness:  Ms. Brenda Williams is a 35 yo obese African American Female diagnosed with lymphangiomyomatosis having increased pulmonary symptoms and followed carefully by Dr. Lamonte Sakai.  Her recent PFTs showed further worsening in her DLCO.  Due to progression it was felt she should undergo lung biopsy for tissue diagnosis.  She was referred to [facility] for evaluation.  She was evaluated by Dr. Prescott Gum on 10/31/2015 at which time he was agreeable to proceed with lung biopsy.  The risks and benefits of the procedure were explained to the patient and she was agreeable to proceed.  However, she wished to wait until after her family vacation.  Hospital Course:   She presented to  [facility] on 12/29/2015.  She underwent Right VATS with lung biopsy of right lower lobe and right upper lobe.  She tolerated the procedure without difficulty, was extubated and taken to the SICU in stable condition. The patient made good progress post operatively.  Her chest tubes were free from air leak and transitioned to water seal on POD #1.  Follow up CXR was free from pneumothorax.  Her chest tubes were successfully removed on POD #2.  She was also felt stable for transfer to the telemetry unit in stable condition.  She continues to progress.  Follow up CXR shows some minor sub cutaneous emphysema and no pneumothorax.  She had a brief run of NSVT.  She is ambulating independently.  Her pain is well controlled.  She is tolerating a diet.  She is felt medically stable for discharge home today.     Significant Diagnostic Studies: radiology:   CT scan:  1. Stable pulmonary nodule within the right upper lobe measuring 8 mm. 2. Numerous progressive thin walled cystic structures are identified throughout both lungs and are compatible with lymphangioleiomyomatosis (LAM)  Pathology: pending  Treatments: surgery:   Right VATS (video-assisted thoracoscopic surgery) with lung biopsy - right lower lobe and right upper lobe.  Disposition: 01-Home or Self Care   Discharge Medications:     Medication List    TAKE these medications        clotrimazole 1 % external solution  Commonly known as:  LOTRIMIN  Apply 1 application topically 2 (two) times daily.     ibuprofen 100 MG tablet  Commonly known as:  ADVIL,MOTRIN  Take 100 mg by mouth every 6 (  six) hours as needed for fever. Reported on 10/01/2015     OVER THE COUNTER MEDICATION  Take 1 tablet by mouth daily. "OTC Herbal Cleanse"     oxyCODONE 5 MG immediate release tablet  Commonly known as:  Oxy IR/ROXICODONE  Take 1-2 tablets (5-10 mg total) by mouth every 4 (four) hours as needed for severe pain.     phentermine 30 MG  capsule  Take 1 capsule (30 mg total) by mouth every morning.     PRENATAL VITAMINS PLUS 27-1 MG Tabs  Take 1 tablet by mouth every morning.     Vitamin D (Ergocalciferol) 50000 units Caps capsule  Commonly known as:  DRISDOL  Take 1 capsule (50,000 Units total) by mouth every 7 (seven) days.       Follow-up Information    Follow up with Len Childs, MD.   Specialty:  Cardiothoracic Surgery   Why:  PA/LAT CXR to be taken (at [facility] which is in the same building as Dr. Lucianne Lei Trigt's office) one hour prior to office appointment;Office will call with appointment date and time   Contact information:   [place] [address] [phone]       Signed: Ellwood Handler 12/31/2015, 8:33 AM

## 2016-01-04 ENCOUNTER — Other Ambulatory Visit: Payer: Self-pay

## 2016-01-04 DIAGNOSIS — G8918 Other acute postprocedural pain: Secondary | ICD-10-CM

## 2016-01-04 MED ORDER — TRAMADOL HCL 50 MG PO TABS: 50.0000 mg | ORAL_TABLET | Freq: Four times a day (QID) | ORAL | Status: DC | PRN

## 2016-01-04 NOTE — Telephone Encounter (Signed)
RX for Tramadol 50 mg faxed to [facility]

## 2016-01-14 ENCOUNTER — Other Ambulatory Visit: Payer: Self-pay | Admitting: Cardiothoracic Surgery

## 2016-01-14 DIAGNOSIS — J8481 Lymphangioleiomyomatosis: Secondary | ICD-10-CM

## 2016-01-16 ENCOUNTER — Ambulatory Visit
Admission: RE | Admit: 2016-01-16 | Discharge: 2016-01-16 | Disposition: A | Discharge status: Home / Self Care | Payer: 59 | Source: Ambulatory Visit | Attending: Cardiothoracic Surgery | Admitting: Cardiothoracic Surgery

## 2016-01-16 ENCOUNTER — Other Ambulatory Visit: Payer: Self-pay | Admitting: *Deleted

## 2016-01-16 ENCOUNTER — Ambulatory Visit (INDEPENDENT_AMBULATORY_CARE_PROVIDER_SITE_OTHER): Payer: Self-pay | Admitting: Cardiothoracic Surgery

## 2016-01-16 ENCOUNTER — Encounter: Payer: Self-pay | Admitting: Cardiothoracic Surgery

## 2016-01-16 VITALS — BP 119/79 | HR 80 | Resp 16 | Ht 67.5 in | Wt 230.0 lb

## 2016-01-16 DIAGNOSIS — G5691 Unspecified mononeuropathy of right upper limb: Secondary | ICD-10-CM

## 2016-01-16 DIAGNOSIS — Z9889 Other specified postprocedural states: Secondary | ICD-10-CM

## 2016-01-16 DIAGNOSIS — J8481 Lymphangioleiomyomatosis: Secondary | ICD-10-CM

## 2016-01-16 DIAGNOSIS — G8918 Other acute postprocedural pain: Secondary | ICD-10-CM

## 2016-01-16 DIAGNOSIS — R911 Solitary pulmonary nodule: Secondary | ICD-10-CM

## 2016-01-16 MED ORDER — TRAMADOL HCL 50 MG PO TABS: 50.0000 mg | ORAL_TABLET | Freq: Four times a day (QID) | ORAL | Status: DC | PRN

## 2016-01-16 NOTE — Progress Notes (Signed)
PCP is Leeanne Rio, PA-C Referring Provider is Collene Gobble, MD  Chief Complaint  Patient presents with  . Routine Post Op    2 week f/u with CXR, s/p RT VATS, lung biopsy 12/28/2015    HPI: Routine postop follow-up after right VATS for lung biopsy for interstitial lung disease. Patient had clinical diagnosis of lymphangiomyomatosis-LGM This was confirmed by pathology. The incisions are well-healed. The patient still has some postthoracotomy pain. The patient complains of weakness in her right hand without numbness. It appears a right radial A-line was placed at the time of surgery. She has a intact but weak right hand grip.   The patient is right-hand dominant  Follow-up chest x-ray today shows no pneumothorax or effusion with evidence of interstitial lung disease  Past Medical History  Diagnosis Date  . Herpes     never had outbreak. pos per blood, doesn't know which type  . Polycystic ovarian syndrome   . Abnormal Pap smear 2006    leep  . History of chicken pox   . SVD (spontaneous vaginal delivery)     x 1  . Arthritis     osteoarthritis  . Complication of anesthesia     [facility], HAD ??  SEIZURE/ SHAKING  . Varicose vein of leg   . Shortness of breath dyspnea     W/ EXERTION   . Anxiety   . GERD (gastroesophageal reflux disease)   . Headache     Past Surgical History  Procedure Laterality Date  . Knee surgery  1998    right knee  . Leep  2006  . Hysteroscopy  2009    uterine polyp  . Sphincterotomy  2010  . Ivf retrival  2010, 2011  . Uterine polyp removal    . Dilation and evacuation N/A 09/14/2012    Procedure: DILATATION AND EVACUATION;  Surgeon: Allena Katz, MD;  Location: [facility];  Service: Gynecology;  Laterality: N/A;  . Video assisted thoracoscopy Right 12/28/2015    Procedure: RIGHT VIDEO ASSISTED THORACOSCOPY;  Surgeon: Ivin Poot, MD;  Location: [facility];  Service: Thoracic;  Laterality: Right;  . Lung biopsy Right  12/28/2015    Procedure: RIGHT LUNG BIOPSY;  Surgeon: Ivin Poot, MD;  Location: [facility];  Service: Thoracic;  Laterality: Right;    Family History  Problem Relation Age of Onset  . Diabetes Maternal Grandmother   . Heart disease Maternal Grandmother   . Hypertension Maternal Grandfather   . Cancer Maternal Grandfather 70    bone, colon   . Diabetes Maternal Grandfather   . Diabetes Mother   . Cancer Mother 63    breast cancer  . Anesthesia problems Neg Hx   . Hypertension Father     Social History Social History  Substance Use Topics  . Smoking status: Never Smoker   . Smokeless tobacco: Never Used  . Alcohol Use: 0.0 oz/week    0 Standard drinks or equivalent per week     Comment: OCC    Current Outpatient Prescriptions  Medication Sig Dispense Refill  . ibuprofen (ADVIL,MOTRIN) 100 MG tablet Take 100 mg by mouth every 6 (six) hours as needed for fever. Reported on 10/01/2015    . OVER THE COUNTER MEDICATION Take 1 tablet by mouth daily. "OTC Herbal Cleanse"    . phentermine 30 MG capsule Take 1 capsule (30 mg total) by mouth every morning. 30 capsule 1  . traMADol (ULTRAM) 50 MG  tablet Take 1 tablet (50 mg total) by mouth every 6 (six) hours as needed. 40 tablet 0  . Vitamin D, Ergocalciferol, (DRISDOL) 50000 units CAPS capsule Take 1 capsule (50,000 Units total) by mouth every 7 (seven) days. 12 capsule 0   No current facility-administered medications for this visit.    Allergies  Allergen Reactions  . Fentanyl Itching    Benadryl effective for itching.    Review of Systems  No shortness of breath Improved appetite and strength Still requires tramadol for pain when necessary  BP 119/79 mmHg  Pulse 80  Resp 16  Ht 5' 7.5" (1.715 m)  Wt 230 lb (104.327 kg)  BMI 35.47 kg/m2  SpO2 98%  LMP 12/13/2015 Physical Exam Alert and comfortable Breath sounds clear and equal VATS incisions and dry Chest tube suture removed  Diagnostic Tests: Chest x-ray clear  postop today  Impression: Status post right VATS and lung biopsy for [facility] Probable acute neuropathy of right hand from possible A-line placement. We'll refer to outpatient physical therapy  Plan: Patient may resume driving in normal household activities. No lifting more than 25 pounds. She was provided a refill prescription for tramadol. She will return in a month to assess her incisions and to assess her right hand grip weakness. We'll hold on referral to an orthopedic hand specialist at this time.  Len Childs, MD [facility] [phone]

## 2016-01-28 ENCOUNTER — Encounter: Payer: Self-pay | Admitting: Emergency Medicine

## 2016-01-28 ENCOUNTER — Ambulatory Visit (INDEPENDENT_AMBULATORY_CARE_PROVIDER_SITE_OTHER): Payer: 59 | Admitting: Emergency Medicine

## 2016-01-28 VITALS — BP 114/70 | HR 74 | Ht 67.0 in | Wt 232.0 lb

## 2016-01-28 DIAGNOSIS — J8481 Lymphangioleiomyomatosis: Secondary | ICD-10-CM | POA: Diagnosis not present

## 2016-01-28 DIAGNOSIS — Z23 Encounter for immunization: Secondary | ICD-10-CM | POA: Diagnosis not present

## 2016-01-28 NOTE — Progress Notes (Signed)
History of Present Illness Brenda Williams is a 35 y.o. 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    01/28/2016 Follow Up Appointment: Pt. Follows up after biopsy of right cavitary lesion. She has had biopsy on 12/28/2015 which was positive for Lymphangioleiomyomatosis (LAM). She is recovering from her biopsy to her right posterior back. Sit is unremarkable.No  Drainage or problems since the procedure. She has not been started on any medications as of yet. Per her today, she states that Dr. Nils Pyle is going to leave medical management to Dr. Lamonte Sakai. She currently has some aching on her right at the surgical site and some pain on her left side. She states that dyspnea is not much of an issue for her at this point. She denies fever, chest pain, cough, purulent secretions, orthopnea, or hemoptysis.She and her husband have decided against trying for a third child due to her diagnosis and potential increased risks of pregnancy. We did discuss the importance of pneumonia vaccine and flu vaccine as preventative measures.We discussed that her PFT's do not meet the criteria for starting medications.( Sirolimus). We will follow up with CT and PFT's annually at this point, with close and careful monitoring, maintenance and management..    Tests PFT's : 10/2015 FEV1: 2.45 ( 84%) F/F Ratio: 86 ( 102%)  Pulmonary Function Diagnosis: Mild Restriction Moderate Diffusion Defect    06/28/2015  IMPRESSION: 1. Stable pulmonary nodule within the right upper lobe measuring 8 mm. 2. Numerous progressive thin walled cystic structures are identified throughout both lungs and are compatible with lymphangioleiomyomatosis (LAM)   01/16/2016:  FINDINGS: The heart size and mediastinal contours are within normal limits. Scar like density within the right upper lobe is unchanged. Both lungs are clear. The visualized skeletal structures  are unremarkable.  IMPRESSION: 1. No acute cardiopulmonary abnormalities. 2. Stable right upper lobe scarring.  Past medical hx Past Medical History  Diagnosis Date  . Herpes     never had outbreak. pos per blood, doesn't know which type  . Polycystic ovarian syndrome   . Abnormal Pap smear 2006    leep  . History of chicken pox   . SVD (spontaneous vaginal delivery)     x 1  . Arthritis     osteoarthritis  . Complication of anesthesia     [facility], HAD ??  SEIZURE/ SHAKING  . Varicose vein of leg   . Shortness of breath dyspnea     W/ EXERTION   . Anxiety   . GERD (gastroesophageal reflux disease)   . Headache      Past surgical hx, Family hx, Social hx all reviewed.  Current Outpatient Prescriptions on File Prior to Visit  Medication Sig  . ibuprofen (ADVIL,MOTRIN) 100 MG tablet Take 100 mg by mouth every 6 (six) hours as needed for fever. Reported on 10/01/2015  . traMADol (ULTRAM) 50 MG tablet Take 1 tablet (50 mg total) by mouth every 6 (six) hours as needed.  . Vitamin D, Ergocalciferol, (DRISDOL) 50000 units CAPS capsule Take 1 capsule (50,000 Units total) by mouth every 7 (seven) days.   No current facility-administered medications on file prior to visit.     Allergies  Allergen Reactions  . Fentanyl Itching    Benadryl effective for itching.    Review Of Systems:  Constitutional:   No  weight loss, night sweats,  Fevers, chills, fatigue, or  lassitude.  HEENT:   No headaches,  Difficulty swallowing,  Tooth/dental problems, or  Sore throat,                No sneezing, itching, ear ache, nasal congestion, post nasal drip,   CV:  No chest pain,  Orthopnea, PND, swelling in lower extremities, anasarca, dizziness, palpitations, syncope.   GI  No heartburn, indigestion, abdominal pain, nausea, vomiting, diarrhea, change in bowel habits, loss of appetite, bloody stools.   Resp: No shortness of breath with exertion or at rest.  No excess mucus,  no productive cough,  No non-productive cough,  No coughing up of blood.  No change in color of mucus.  No wheezing.  No chest wall deformity  Skin: no rash or lesions.  GU: no dysuria, change in color of urine, no urgency or frequency.  No flank pain, no hematuria   MS:  No joint pain or swelling.  No decreased range of motion.  No back pain.  Psych:  No change in mood or affect. No depression or anxiety.  No memory loss.   Vital Signs BP 114/70 mmHg  Pulse 74  Ht 5\' 7"  (1.702 m)  Wt 232 lb (105.235 kg)  BMI 36.33 kg/m2  SpO2 97%   Physical Exam:  General- No distress,  A&Ox3, pleasant. ENT: No sinus tenderness, TM clear, pale nasal mucosa, no oral exudate,no post nasal drip, no LAN Cardiac: S1, S2, regular rate and rhythm, no murmur Chest: No wheeze/ rales/ dullness; no accessory muscle use, no nasal flaring, no sternal retractions Abd.: Soft Non-tender Ext: No clubbing cyanosis, edema Neuro:  normal strength Skin: No rashes, warm and dry Psych: normal mood and behavior   Assessment/Plan  Lymphangioleiomyomatosis (HCC) Biopsy proven diagnosis LAM PFT's 10/2015 FEV1: 2.45 ( 84%) F/F Ratio: 86 ( 102%) Pulmonary Function Diagnosis: Mild Restriction Moderate Diffusion Defect Pulmonary Function Diagnosis: Mild Restriction Moderate Diffusion Defect Plan: It is nice to meet you today. We will walk you in the office at next visit We will give you your pneumonia vaccine today. Remember to get your flu shot this  Fall. Slowly increase your activity and your fitness level.. We will repeat PFT's April 2018. We will repeat CT in Dec. 2017. Follow up appointment with Dr. Lamonte Sakai in December after CT Chest. There is evidence that suggests that it is best to avoid Soy products with LAM. Please contact office for sooner follow up if symptoms do not improve or worsen or seek emergency care       Magdalen Spatz, NP 01/28/2016  5:48 PM   Attending Note:  I have examined  patient, reviewed labs, studies and notes. I have discussed the case with Brenda Williams, and I agree with the data and plans as amended above. Patient has a history of multiple thin-walled cavities on CT scan of the chest for which she went for VATS biopsy. Pathology is consistent with the lymphangioleiomyomatosis. We discussed the prognosis, the potential complications of the disease. We also discussed the indications for treatment. Her FEV1 is in the normal range at this time. I think we will base the decision for sirolimus on her functional capacity and any progression on her next CT scan of the chest. She needs pneumonia vaccination and flu vaccination in the fall. We will follow her PFTs annually.  I will see her in December after her CT scan to review and to decide about medical therapy.  Over 75% of this 45 minute visit was spent in counseling regarding her diagnosis and planning therapy.    Herbie Baltimore  Lamonte Sakai, MD, PhD 01/28/2016, 6:04 PM  Pulmonary and Critical Care [phone] or if no answer [phone]

## 2016-01-28 NOTE — Patient Instructions (Addendum)
It is nice to meet you today. We will walk you in the office at next visit We will give you your pneumonia vaccine today. Remember to get your flu shot this  Fall. Slowly increase your activity and your fitness level.. We will repeat PFT's April 2018. We will repeat CT in Dec. 2017. Follow up appointment with Dr. Lamonte Sakai in December after CT Chest. There is evidence that suggests that it is best to avoid Soy products with LAM. Please contact office for sooner follow up if symptoms do not improve or worsen or seek emergency care

## 2016-01-28 NOTE — Assessment & Plan Note (Addendum)
Biopsy proven diagnosis LAM PFT's 10/2015 FEV1: 2.45 ( 84%) F/F Ratio: 86 ( 102%) Pulmonary Function Diagnosis: Mild Restriction Moderate Diffusion Defect Pulmonary Function Diagnosis: Mild Restriction Moderate Diffusion Defect Plan: It is nice to meet you today. We will walk you in the office at next visit We will give you your pneumonia vaccine today. Remember to get your flu shot this  Fall. Slowly increase your activity and your fitness level.. We will repeat PFT's April 2018. We will repeat CT in Dec. 2017. Follow up appointment with Dr. Lamonte Sakai in December after CT Chest. There is evidence that suggests that it is best to avoid Soy products with LAM. Please contact office for sooner follow up if symptoms do not improve or worsen or seek emergency care  We discussed that we will wait for CT Scan results of 06/2016 scan to determine progression of disease and whether or not to initiate treatment with Sirolimus.

## 2016-01-29 ENCOUNTER — Encounter: Payer: Self-pay | Admitting: *Deleted

## 2016-02-04 NOTE — H&P (Signed)
Encounter Date: 10/31/2015 Brenda Poot, MD  Cardiothoracic Surgery   PCP is Hassell Done Sandria Manly, PA-C Referring Provider is Collene Gobble, MD       Chief Complaint  Patient presents with  . Lung Lesion      Surgical eval, for possible VATS biopsy for microcystic disease, Chest CT 06/28/15  patient examined, serial chest CT scans and PFTs personally reviewed and counseled with patient   HPI: Very nice 36 year old obese AA female with clinical diagnosis of lymphangiomyomatosis having increased pulmonary symptoms and followed carefully by Dr. Lamonte Sakai.recent PFTs indicating FEV1 80% of predicted, DLCO 57% of predicted. The DLCO has significantly decreased since previous measurement 2015 [74% ]..patient has had no spontaneous pneumothorax. Last CT scan performed December 2016 shows progression of disease since previous CT scan the previous year Because of the patient's symptoms of dyspnea with exertion cough, a lung biopsy is been requested by her pulmonologist to help direct therapy.    The patient also has a7 mm right middle lobe nodule which has been present without change since the original CT scan in 2014. There is no significant mediastinal adenopathy. Last echocardiogram 2014 was normal. Renal function is normal.      Past Medical History  Diagnosis Date  . Herpes        never had outbreak. pos per blood, doesn't know which type  . Polycystic ovarian syndrome    . Abnormal Pap smear 2006      leep  . History of chicken pox    . SVD (spontaneous vaginal delivery)        x 1  . Arthritis        osteoarthritis            Past Surgical History  Procedure Laterality Date  . Knee surgery   1998      right knee  . Leep   2006  . Hysteroscopy   2009      uterine polyp  . Sphincterotomy   2010  . Ivf retrival   2010, 2011  . Uterine polyp removal      . Dilation and evacuation N/A 09/14/2012      Procedure: DILATATION AND EVACUATION;  Surgeon: Allena Katz, MD;   Location: [facility];  Service: Gynecology;  Laterality: N/A;            Family History  Problem Relation Age of Onset  . Diabetes Maternal Grandmother    . Heart disease Maternal Grandmother    . Hypertension Maternal Grandfather    . Cancer Maternal Grandfather 70      bone, colon   . Diabetes Maternal Grandfather    . Diabetes Mother    . Cancer Mother 109      breast cancer  . Anesthesia problems Neg Hx    . Hypertension Father        Social History Social History  Substance Use Topics  . Smoking status: Never Smoker   . Smokeless tobacco: Never Used  . Alcohol Use: No            Current Outpatient Prescriptions  Medication Sig Dispense Refill  . clotrimazole (LOTRIMIN) 1 % external solution Apply 1 application topically 2 (two) times daily. 30 mL 1  . ibuprofen (ADVIL,MOTRIN) 100 MG tablet Take 100 mg by mouth every 6 (six) hours as needed for fever. Reported on 10/01/2015      . phentermine 30 MG capsule Take 1 capsule (30 mg total) by mouth  every morning. 30 capsule 1  . Prenatal Vit-Fe Fumarate-FA (PRENATAL VITAMINS PLUS) 27-1 MG TABS Take 1 tablet by mouth every morning. 30 tablet 12  . Vitamin D, Ergocalciferol, (DRISDOL) 50000 units CAPS capsule Take 1 capsule (50,000 Units total) by mouth every 7 (seven) days. 12 capsule 0    No current facility-administered medications for this visit.      No Known Allergies   Review of Systems the patient has tolerated general anesthesia previously without problem        Review of Systems :  [ y ] = yes, [  ] = no         General :  Weight gain [  yes ]    Weight loss  [   ]  Fatigue [ yes ]  Fever [  ]  Chills  [  ]                                Weakness  [  ]            HEENT    Headache [  ]  Dizziness [  ]  Blurred vision [  ] Glaucoma  [  ]                          Nosebleeds [  ] Painful or loose teeth [  ]         Cardiac :  Chest pain/ pressure [  ]  Resting SOB [  ] exertional SOB [place]  ]                         Orthopnea [  ]  Pedal edema  [  ]  Palpitations [  ] Syncope/presyncope [ ]                         Paroxysmal nocturnal dyspnea [  ]nonproductive cough          Pulmonary : cough [ yes ]  wheezing [  ]  Hemoptysis [  ] Sputum [  ] Snoring [  ]                              Pneumoyesthorax [  ]  Sleep apnea [  ]         GI : Vomiting [  ]  Dysphagia [  ]  Melena  [  ]  Abdominal pain [  ] BRBPR [  ]              Heart burn [  ]  Constipation [  ] Diarrhea  [  ] Colonoscopy [   ]         GU : Hematuria [  ]  Dysuria [  ]  Nocturia [  ] UTI's [  ]         Vascular : Claudication [  ]  Rest pain [  ]  DVT [  ] Vein stripping [  ] leg ulcers [  ]                          TIA [  ] Stroke [  ]  Varicose veins [  ]  NEURO :  Headaches  [  ] Seizures [  ] Vision changes [  ] Paresthesias [  ]                                       Seizures [  ]         Musculoskeletal :  Arthritis [  ] Gout  [  ]  Back pain [  ]  Joint pain [  ]         Skin :  Rash [  ]  Melanoma [  ] Sores [  ]         Heme : Bleeding problems [  ]Clotting Disorders [  ] Anemia [  ]Blood Transfusion [ ]          Endocrine : Diabetes [  ] Heat or Cold intolerance [  ] Polyuria [  ]excessive thirst [ ]          Psych : Depression [  ]  Anxiety [  ]  Psych hospitalizations [  ] Memory change [  ]      Right-hand dominant                                          BP 140/90 mmHg  Pulse 56  Resp 20  Ht 5\' 7"  (1.702 m)  Wt 230 lb (104.327 kg)  BMI 36.01 kg/m2  SpO2 97%  LMP 09/14/2015 Physical Exam     Physical Exam   General: obese young AA female no acute distress HEENT: Normocephalic pupils equal , dentition adequate Neck: Supple without JVD, adenopathy, or bruit Chest: Clear to auscultation, symmetrical breath sounds, no rhonchi, no tenderness             or deformity Cardiovascular: Regular rate and rhythm, no murmur, no gallop, peripheral pulses             palpable in all extremities Abdomen:  Soft,  nontender, no palpable mass or organomegaly Extremities: Warm, well-perfused, no clubbing cyanosis edema or tenderness,              no venous stasis changes of the legs Rectal/GU: Deferred Neuro: Grossly non--focal and symmetrical throughout Skin: Clean and dry without rash or ulceration     Diagnostic Tests: CT scan December 2016 percent reviewed PFTs performed earlier this month personally reviewed showing decline in function since 2015   Impression: Clinical diagnosis of L.AM Tissue diagnosis is requested in order to help direct therapy by her pulmonologist I feel this is appropriate and I discussed the procedure of VATS for right lung biopsy with the patient and her husband including the details of surgery the indications alternatives and the risks. They understand and agree to proceed.   Plan:patient wishes to schedule surgery in June after her family returns from vacation. Right VATS for lung biopsy      Len Childs, MD [facility] [phone]        Surgical Consult on 10/31/2015       Routing History       Detailed Report

## 2016-02-07 ENCOUNTER — Encounter: Payer: Self-pay | Admitting: Occupational Therapy

## 2016-02-07 ENCOUNTER — Ambulatory Visit: Payer: 59 | Attending: Cardiothoracic Surgery | Admitting: Occupational Therapy

## 2016-02-07 DIAGNOSIS — M6281 Muscle weakness (generalized): Secondary | ICD-10-CM | POA: Insufficient documentation

## 2016-02-07 NOTE — Patient Instructions (Signed)
Theraputty HEP:  Green.  Do these exercises 1-2 times/day every day!  1. Make a ball 2. Make a pancake 3. Make a cone Do this sequence 5 times  4. Make a fat hot dog. Squeeze as hard as you can.   Do 10 times  5.  Ring around the fingers.  Do 5-7 times 7. Make a snake: 2 pinch 3 pinch Lateral pinch  Do each pinch 2 times

## 2016-02-07 NOTE — Therapy (Signed)
[facility] [address] [place], [place], [address] Phone: [phone]   Fax:  [phone]  Occupational Therapy Evaluation  Patient Details  Name: Brenda Williams MRN: [id number] Date of Birth: [birth year] Referring Provider: Dr. Nils Pyle  Encounter Date: 02/07/2016      OT End of Session - 02/07/16 1147    Visit Number 1   Number of Visits 1   Date for OT Re-Evaluation --  n/a   Authorization Type [employer]   OT Start Time 1100   OT Stop Time 1138   OT Time Calculation (min) 38 min   Activity Tolerance Patient tolerated treatment well      Past Medical History:  Diagnosis Date  . Abnormal Pap smear 2006   leep  . Anxiety   . Arthritis    osteoarthritis  . Complication of anesthesia    [facility], HAD ??  SEIZURE/ SHAKING  . GERD (gastroesophageal reflux disease)   . Headache   . Herpes    never had outbreak. pos per blood, doesn't know which type  . History of chicken pox   . Polycystic ovarian syndrome   . Shortness of breath dyspnea    W/ EXERTION   . SVD (spontaneous vaginal delivery)    x 1  . Varicose vein of leg     Past Surgical History:  Procedure Laterality Date  . DILATION AND EVACUATION N/A 09/14/2012   Procedure: DILATATION AND EVACUATION;  Surgeon: Allena Katz, MD;  Location: [facility];  Service: Gynecology;  Laterality: N/A;  . HYSTEROSCOPY  2009   uterine polyp  . IVF Retrival  2010, 2011  . KNEE SURGERY  1998   right knee  . LEEP  2006  . LUNG BIOPSY Right 12/28/2015   Procedure: RIGHT LUNG BIOPSY;  Surgeon: Ivin Poot, MD;  Location: [facility];  Service: Thoracic;  Laterality: Right;  . SPHINCTEROTOMY  2010  . uterine polyp removal    . VIDEO ASSISTED THORACOSCOPY Right 12/28/2015   Procedure: RIGHT VIDEO ASSISTED THORACOSCOPY;  Surgeon: Ivin Poot, MD;  Location: [facility];  Service: Thoracic;  Laterality: Right;    There were no vitals filed for this  visit.      Subjective Assessment - 02/07/16 1109    Subjective  The hand is already much better.    Pertinent History see epic snapshot - pt s/p VATS for lung biopsy and suspect R hand weakness from A Line placement.   Patient Stated Goals My hand is better I just want to make sure it is really ok   Currently in Pain? No/denies           [facility] OT Assessment - 02/07/16 0001      Assessment   Diagnosis R hand weakness from A line placement during surgery   Referring Provider Dr. 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     ADL   Eating/Feeding Independent  Grooming Independent   Upper Body Bathing Independent   Lower Body Bathing Independent   Upper Body Dressing Independent  harder to do buttons and zippers but can do it   Lower Body Dressing Modified independent   Toilet Tranfer Independent   Toileting - Clothing Manipulation Independent   Toileting -  Tour manager Independent     IADL   Shopping Takes care of all shopping needs independently   Light Housekeeping Maintains house alone or with occasional assistance   Meal Prep Plans, prepares and serves adequate meals independently   Training and development officer own vehicle   Medication Management Is responsible for taking medication in correct dosages at correct time   Development worker, community financial matters independently (budgets, writes checks, pays rent, bills goes to bank), collects and keeps track of income     Mobility   Mobility Status Independent      Written Expression   Dominant Hand Right     Vision - History   Baseline Vision No visual deficits     Vision Assessment   Eye Alignment Within Functional Limits   Comment Denies any visual changes     Activity Tolerance   Activity Tolerance Tolerate 30+ min activity without fatigue     Cognition   Overall Cognitive Status Within Functional Limits for tasks assessed     Sensation   Light Touch Appears Intact   Hot/Cold Appears Intact   Proprioception Appears Intact     Coordination   Gross Motor Movements are Fluid and Coordinated Yes   9 Hole Peg Test Right   Right 9 Hole Peg Test 20.32     ROM / Strength   AROM / PROM / Strength AROM;Strength     AROM   Overall AROM  Within functional limits for tasks performed     Strength   Overall Strength Deficits   Overall Strength Comments grip strength -see below     Hand Function   Right Hand Gross Grasp Impaired   Right Hand Grip (lbs) 45   Left Hand Gross Grasp Functional   Left Hand Grip (lbs) 80                         OT Education - 02/07/16 1143    Education provided Yes   Education Details putty program (green) for hand strength   Person(s) Educated Patient   Methods Explanation;Demonstration;Verbal cues;Handout   Comprehension Verbalized understanding;Returned demonstration             OT Long Term Goals - 02/07/16 1144      OT LONG TERM GOAL #1   Title Pt will be mod I with HEP for grip and hand strength   Status Achieved               Plan - 02/07/16 1144    Clinical Impression Statement Pt is a 35 year old female s/p VATS for lung biopsy who presents today with R hand weakness likely from A line placement.  PMH;lymphangiomyomatosis.  Pt has chosen to work on weakness at home therefore given HEP with green putty. Pt reports grip strength is much better this week than last week.    Rehab Potential Excellent   OT Frequency One time visit   OT Treatment/Interventions  Therapeutic exercises   Plan eval completed and pt issued HEP - per pt's preference, d/c from OT today (one time visit)   Consulted and Agree with  Plan of Care Patient      Patient will benefit from skilled therapeutic intervention in order to improve the following deficits and impairments:  Decreased strength  Visit Diagnosis: Muscle weakness (generalized) - Plan: Ot plan of care cert/re-cert    Problem List Patient Active Problem List   Diagnosis Date Noted  . 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 Breast cancer screening 09/17/2015  . Bilateral knee pain 08/20/2015  . Obesity 01/12/2015  . Chronic fatigue 12/01/2014  . Abnormal CT scan, chest 06/28/2013  . Solitary pulmonary nodule 06/28/2013  . Other ectopic pregnancy without intrauterine pregnancy 05/12/2011  . Cervix abnormality 05/12/2011  . Polycystic ovaries 05/12/2011  . Female infertility of unspecified origin 05/12/2011  . Nausea alone 05/12/2011    Quay Burow, OTR/L 02/07/2016, 11:51 AM  [facility] [address] [place], [place], [address] Phone: [phone]   Fax:  [phone]  Name: Brenda Williams MRN: [id number] Date of Birth: May 04, 1981

## 2016-02-13 ENCOUNTER — Encounter: Payer: Self-pay | Admitting: Cardiothoracic Surgery

## 2016-02-13 ENCOUNTER — Ambulatory Visit (INDEPENDENT_AMBULATORY_CARE_PROVIDER_SITE_OTHER): Payer: Self-pay | Admitting: Cardiothoracic Surgery

## 2016-02-13 VITALS — BP 132/88 | HR 75 | Resp 16 | Ht 67.0 in | Wt 232.0 lb

## 2016-02-13 DIAGNOSIS — G5691 Unspecified mononeuropathy of right upper limb: Secondary | ICD-10-CM

## 2016-02-13 DIAGNOSIS — Z9889 Other specified postprocedural states: Secondary | ICD-10-CM

## 2016-02-13 DIAGNOSIS — R911 Solitary pulmonary nodule: Secondary | ICD-10-CM

## 2016-02-13 NOTE — Progress Notes (Signed)
PCP is Leeanne Rio, PA-C Referring Provider is Collene Gobble, MD  Chief Complaint  Patient presents with  . Routine Post Op    to reevaluate right hand weakness after OT REFERRAL...had one visit and then felt home exeercises would be sufficient    [id number] postop visit after right VATS and lung biopsy for [facility]. Patient's pulmonologist has decided not to start immunosuppression at this time because of the patient's good clinical status and functional level. She tolerated the surgery well and has minimal postoperative pain. She is ready to return to work and normal daily activities. Surgical incision is well-healed. In postop chest x-ray is clear. Patient had some weakness in her right dominant hand after surgery possibly related to a radial artery line placed. She was seen by outpatient occupational therapy and has exercise program in place. She has noticed an improvement in her right hand over the past 2 weeks. This does not appear to need formal orthopedic hand evaluation and therapy.   Past Medical History:  Diagnosis Date  . Abnormal Pap smear 2006   leep  . Anxiety   . Arthritis    osteoarthritis  . Complication of anesthesia    [facility], HAD ??  SEIZURE/ SHAKING  . GERD (gastroesophageal reflux disease)   . Headache   . Herpes    never had outbreak. pos per blood, doesn't know which type  . History of chicken pox   . Polycystic ovarian syndrome   . Shortness of breath dyspnea    W/ EXERTION   . SVD (spontaneous vaginal delivery)    x 1  . Varicose vein of leg     Past Surgical History:  Procedure Laterality Date  . DILATION AND EVACUATION N/A 09/14/2012   Procedure: DILATATION AND EVACUATION;  Surgeon: Allena Katz, MD;  Location: [facility];  Service: Gynecology;  Laterality: N/A;  . HYSTEROSCOPY  2009   uterine polyp  . IVF Retrival  2010, 2011  . KNEE SURGERY  1998   right knee  . LEEP  2006  . LUNG BIOPSY Right 12/28/2015   Procedure:  RIGHT LUNG BIOPSY;  Surgeon: Ivin Poot, MD;  Location: [facility];  Service: Thoracic;  Laterality: Right;  . SPHINCTEROTOMY  2010  . uterine polyp removal    . VIDEO ASSISTED THORACOSCOPY Right 12/28/2015   Procedure: RIGHT VIDEO ASSISTED THORACOSCOPY;  Surgeon: Ivin Poot, MD;  Location: [facility];  Service: Thoracic;  Laterality: Right;    Family History  Problem Relation Age of Onset  . Diabetes Maternal Grandmother   . Heart disease Maternal Grandmother   . Hypertension Maternal Grandfather   . Cancer Maternal Grandfather 70    bone, colon   . Diabetes Maternal Grandfather   . Diabetes Mother   . Cancer Mother 75    breast cancer  . Anesthesia problems Neg Hx   . Hypertension Father     Social History Social History  Substance Use Topics  . Smoking status: Never Smoker  . Smokeless tobacco: Never Used  . Alcohol use 0.0 oz/week     Comment: OCC    Current Outpatient Prescriptions  Medication Sig Dispense Refill  . ibuprofen (ADVIL,MOTRIN) 100 MG tablet Take 100 mg by mouth every 6 (six) hours as needed for fever. Reported on 10/01/2015    . traMADol (ULTRAM) 50 MG tablet Take 1 tablet (50 mg total) by mouth every 6 (six) hours as needed. 40 tablet 0  . Vitamin  D, Ergocalciferol, (DRISDOL) 50000 units CAPS capsule Take 1 capsule (50,000 Units total) by mouth every 7 (seven) days. 12 capsule 0   No current facility-administered medications for this visit.     Allergies  Allergen Reactions  . Fentanyl Itching    Benadryl effective for itching.    Review of Systems  No complaints  BP 132/88   Pulse 75   Resp 16   Ht 5\' 7"  (1.702 m)   Wt 232 lb (105.2 kg)   SpO2 98% Comment: ON RA  BMI 36.34 kg/m  Physical Exam      Exam    General- alert and comfortable   Lungs- clear without rales, wheezes   Cor- regular rate and rhythm, no murmur , gallop   Abdomen- soft, non-tender   Extremities - warm, non-tender, minimal edema   Neuro- oriented, appropriate, no  focal weakness   Diagnostic Tests: None  Impression: Patient doing well after right VATS lung biopsy Patient was provided return to work form No pain medications prescribed at this visit  Plan: Return as needed Patient will be managed by Dr. Lamonte Sakai and has chest CT scan planned in approximately 5 months  Len Childs, MD [facility] [phone]

## 2016-03-12 ENCOUNTER — Encounter: Payer: Self-pay | Admitting: Physician Assistant

## 2016-03-12 ENCOUNTER — Ambulatory Visit (INDEPENDENT_AMBULATORY_CARE_PROVIDER_SITE_OTHER): Payer: 59 | Admitting: Physician Assistant

## 2016-03-12 VITALS — BP 130/85 | HR 81 | Temp 97.8°F | Resp 14 | Wt 233.0 lb

## 2016-03-12 DIAGNOSIS — J020 Streptococcal pharyngitis: Secondary | ICD-10-CM | POA: Diagnosis not present

## 2016-03-12 DIAGNOSIS — J029 Acute pharyngitis, unspecified: Secondary | ICD-10-CM | POA: Diagnosis not present

## 2016-03-12 LAB — POCT RAPID STREP A (OFFICE): Rapid Strep A Screen: POSITIVE — AB

## 2016-03-12 MED ORDER — AMOXICILLIN 500 MG PO CAPS: 500.0000 mg | ORAL_CAPSULE | Freq: Two times a day (BID) | ORAL | 0 refills | Status: DC

## 2016-03-12 NOTE — Patient Instructions (Signed)
Please take antibiotic as directed with food. Stay well hydrated. Salt water gargles will help with her pain. Alternate Tylenol and ibuprofen for pain or fever. Place humidifier in bedroom. Symptoms should resolve gradually. Call or return to clinic if symptoms are not resolving.   Strep Throat Strep throat is a bacterial infection of the throat. Your health care provider may call the infection tonsillitis or pharyngitis, depending on whether there is swelling in the tonsils or at the back of the throat. Strep throat is most common during the cold months of the year in children who are 16-78 years of age, but it can happen during any season in people of any age. This infection is spread from person to person (contagious) through coughing, sneezing, or close contact. CAUSES Strep throat is caused by the bacteria called Streptococcus pyogenes. RISK FACTORS This condition is more likely to develop in:  People who spend time in crowded places where the infection can spread easily.  People who have close contact with someone who has strep throat. SYMPTOMS Symptoms of this condition include:  Fever or chills.   Redness, swelling, or pain in the tonsils or throat.  Pain or difficulty when swallowing.  White or yellow spots on the tonsils or throat.  Swollen, tender glands in the neck or under the jaw.  Red rash all over the body (rare). DIAGNOSIS This condition is diagnosed by performing a rapid strep test or by taking a swab of your throat (throat culture test). Results from a rapid strep test are usually ready in a few minutes, but throat culture test results are available after one or two days. TREATMENT This condition is treated with antibiotic medicine. HOME CARE INSTRUCTIONS Medicines  Take over-the-counter and prescription medicines only as told by your health care provider.  Take your antibiotic as told by your health care provider. Do not stop taking the antibiotic even if  you start to feel better.  Have family members who also have a sore throat or fever tested for strep throat. They may need antibiotics if they have the strep infection. Eating and Drinking  Do not share food, drinking cups, or personal items that could cause the infection to spread to other people.  If swallowing is difficult, try eating soft foods until your sore throat feels better.  Drink enough fluid to keep your urine clear or pale yellow. General Instructions  Gargle with a salt-water mixture 3-4 times per day or as needed. To make a salt-water mixture, completely dissolve -1 tsp of salt in 1 cup of warm water.  Make sure that all household members wash their hands well.  Get plenty of rest.  Stay home from school or work until you have been taking antibiotics for 24 hours.  Keep all follow-up visits as told by your health care provider. This is important. SEEK MEDICAL CARE IF:  The glands in your neck continue to get bigger.  You develop a rash, cough, or earache.  You cough up a thick liquid that is green, yellow-brown, or bloody.  You have pain or discomfort that does not get better with medicine.  Your problems seem to be getting worse rather than better.  You have a fever. SEEK IMMEDIATE MEDICAL CARE IF:  You have new symptoms, such as vomiting, severe headache, stiff or painful neck, chest pain, or shortness of breath.  You have severe throat pain, drooling, or changes in your voice.  You have swelling of the neck, or the skin on the  neck becomes red and tender.  You have signs of dehydration, such as fatigue, dry mouth, and decreased urination.  You become increasingly sleepy, or you cannot wake up completely.  Your joints become red or painful.   This information is not intended to replace advice given to you by your health care provider. Make sure you discuss any questions you have with your health care provider.   Document Released: 06/27/2000  Document Revised: 03/21/2015 Document Reviewed: 10/23/2014 Elsevier Interactive Patient Education [employer].

## 2016-03-12 NOTE — Assessment & Plan Note (Signed)
Rapid strep +. + erythema and exudate. Will begin ABX. Rx Amoxicillin. Supportive measures and OTC pain medication reviewed. FU PRN if symptoms are not resolving.

## 2016-03-12 NOTE — Progress Notes (Signed)
Patient presents to clinic today c/o one day of sore throat with odynophagia. Is unsure of fever but notes fatigue and aches. Endorses mild dry cough starting this morning. Denies chest congestion or other upper respiratory symptoms. Denies recent travel or sick contact.  Past Medical History:  Diagnosis Date  . Abnormal Pap smear 2006   leep  . Anxiety   . Arthritis    osteoarthritis  . Complication of anesthesia    2014 [facility]c [facility], HAD ??  SEIZURE/ SHAKING  . GERD (gastroesophageal reflux disease)   . Headache   . Herpes    never had outbreak. pos per blood, doesn't know which type  . History of chicken pox   . Polycystic ovarian syndrome   . Shortness of breath dyspnea    W/ EXERTION   . SVD (spontaneous vaginal delivery)    x 1  . Varicose vein of leg     Current Outpatient Prescriptions on File Prior to Visit  Medication Sig Dispense Refill  . ibuprofen (ADVIL,MOTRIN) 100 MG tablet Take 100 mg by mouth every 6 (six) hours as needed for fever. Reported on 10/01/2015    . traMADol (ULTRAM) 50 MG tablet Take 1 tablet (50 mg total) by mouth every 6 (six) hours as needed. 40 tablet 0  . Vitamin D, Ergocalciferol, (DRISDOL) 50000 units CAPS capsule Take 1 capsule (50,000 Units total) by mouth every 7 (seven) days. 12 capsule 0   No current facility-administered medications on file prior to visit.     Allergies  Allergen Reactions  . Fentanyl Itching    Benadryl effective for itching.    Family History  Problem Relation Age of Onset  . Diabetes Maternal Grandmother   . Heart disease Maternal Grandmother   . Hypertension Maternal Grandfather   . Cancer Maternal Grandfather 70    bone, colon   . Diabetes Maternal Grandfather   . Diabetes Mother   . Cancer Mother 77    breast cancer  . Anesthesia problems Neg Hx   . Hypertension Father     Social History   Social History  . Marital status: Married    Spouse name: N/A  . Number of children: N/A  .  Years of education: N/A   Social History Main Topics  . Smoking status: Never Smoker  . Smokeless tobacco: Never Used  . Alcohol use 0.0 oz/week     Comment: OCC  . Drug use: No  . Sexual activity: Yes    Birth control/ protection: None   Other Topics Concern  . None   Social History Narrative  . None    Review of Systems - See HPI.  All other ROS are negative.  BP 130/85   Pulse 81   Temp 97.8 F (36.6 C) (Oral)   Resp 14   Wt 233 lb (105.7 kg)   SpO2 99%   BMI 36.49 kg/m   Physical Exam  Constitutional: She is oriented to person, place, and time and well-developed, well-nourished, and in no distress.  HENT:  Head: Normocephalic and atraumatic.  Right Ear: Tympanic membrane normal.  Nose: Nose normal.  Mouth/Throat: Uvula is midline and mucous membranes are normal. Oropharyngeal exudate and posterior oropharyngeal erythema present. No tonsillar abscesses.  Eyes: Conjunctivae are normal.  Neck: Neck supple.  Cardiovascular: Normal rate, regular rhythm, normal heart sounds and intact distal pulses.   Pulmonary/Chest: Breath sounds normal. No respiratory distress. She has no wheezes. She has no rales. She exhibits no  tenderness.  Lymphadenopathy:    She has cervical adenopathy.  Neurological: She is alert and oriented to person, place, and time.  Skin: Skin is warm and dry. No rash noted.  Psychiatric: Affect normal.  Vitals reviewed.   Recent Results (from the past 2160 hour(s))  Surgical pcr screen     Status: None   Collection Time: 12/26/15  3:55 PM  Result Value Ref Range   MRSA, PCR NEGATIVE NEGATIVE   Staphylococcus aureus NEGATIVE NEGATIVE    Comment:        The Xpert SA Assay (FDA approved for NASAL specimens in patients over 74 years of age), is one component of a comprehensive surveillance program.  Test performance has been validated by [facility] for patients greater than or equal to 17 year old. It is not intended to diagnose infection nor  to guide or monitor treatment.   hCG, serum, qualitative     Status: None   Collection Time: 12/26/15  3:55 PM  Result Value Ref Range   Preg, Serum NEGATIVE NEGATIVE    Comment:        THE SENSITIVITY OF THIS METHODOLOGY IS >10 mIU/mL.   APTT     Status: None   Collection Time: 12/26/15  3:55 PM  Result Value Ref Range   aPTT 25 24 - 37 seconds  Blood gas, arterial on room air     Status: Abnormal   Collection Time: 12/26/15  3:55 PM  Result Value Ref Range   FIO2 0.21    pH, Arterial 7.426 7.350 - 7.450   pCO2 arterial 38.0 35.0 - 45.0 mmHg   pO2, Arterial 91.7 80.0 - 100.0 mmHg   Bicarbonate 24.5 (H) 20.0 - 24.0 mEq/L   TCO2 25.7 0 - 100 mmol/L   Acid-Base Excess 0.6 0.0 - 2.0 mmol/L   O2 Saturation 97.0 %   Patient temperature 98.6    Collection site LEFT BRACHIAL    Drawn by COLLECTED BY LABORATORY    Sample type ARTERIAL DRAW    Allens test (pass/fail) PASS PASS  CBC     Status: None   Collection Time: 12/26/15  3:55 PM  Result Value Ref Range   WBC 4.8 4.0 - 10.5 K/uL   RBC 4.59 3.87 - 5.11 MIL/uL   Hemoglobin 12.9 12.0 - 15.0 g/dL   HCT 40.2 36.0 - 46.0 %   MCV 87.6 78.0 - 100.0 fL   MCH 28.1 26.0 - 34.0 pg   MCHC 32.1 30.0 - 36.0 g/dL   RDW 14.4 11.5 - 15.5 %   Platelets 270 150 - 400 K/uL  Comprehensive metabolic panel     Status: None   Collection Time: 12/26/15  3:55 PM  Result Value Ref Range   Sodium 138 135 - 145 mmol/L   Potassium 4.0 3.5 - 5.1 mmol/L   Chloride 110 101 - 111 mmol/L   CO2 22 22 - 32 mmol/L   Glucose, Bld 95 65 - 99 mg/dL   BUN 10 6 - 20 mg/dL   Creatinine, Ser 0.54 0.44 - 1.00 mg/dL   Calcium 9.4 8.9 - 10.3 mg/dL   Total Protein 6.9 6.5 - 8.1 g/dL   Albumin 3.6 3.5 - 5.0 g/dL   AST 18 15 - 41 U/L   ALT 18 14 - 54 U/L   Alkaline Phosphatase 48 38 - 126 U/L   Total Bilirubin 0.5 0.3 - 1.2 mg/dL   GFR calc non Af Amer >60 >60 mL/min   GFR  calc Af Amer >60 >60 mL/min    Comment: (NOTE) The eGFR has been calculated using the  CKD EPI equation. This calculation has not been validated in all clinical situations. eGFR's persistently <60 mL/min signify possible Chronic Kidney Disease.    Anion gap 6 5 - 15  Protime-INR     Status: None   Collection Time: 12/26/15  3:55 PM  Result Value Ref Range   Prothrombin Time 13.5 11.6 - 15.2 seconds   INR 1.01 0.00 - 1.49  Urinalysis, Routine w reflex microscopic (not at [facility])     Status: None   Collection Time: 12/26/15  3:55 PM  Result Value Ref Range   Color, Urine YELLOW YELLOW   APPearance CLEAR CLEAR   Specific Gravity, Urine 1.015 1.005 - 1.030   pH 6.0 5.0 - 8.0   Glucose, UA NEGATIVE NEGATIVE mg/dL   Hgb urine dipstick NEGATIVE NEGATIVE   Bilirubin Urine NEGATIVE NEGATIVE   Ketones, ur NEGATIVE NEGATIVE mg/dL   Protein, ur NEGATIVE NEGATIVE mg/dL   Nitrite NEGATIVE NEGATIVE   Leukocytes, UA NEGATIVE NEGATIVE    Comment: MICROSCOPIC NOT DONE ON URINES WITH NEGATIVE PROTEIN, BLOOD, LEUKOCYTES, NITRITE, OR GLUCOSE <1000 mg/dL.  Type and screen     Status: None   Collection Time: 12/26/15  4:12 PM  Result Value Ref Range   ABO/RH(D) A POS    Antibody Screen NEG    Sample Expiration 01/09/2016    Extend sample reason NO TRANSFUSIONS OR PREGNANCY IN THE PAST 3 MONTHS   ABO/Rh     Status: None   Collection Time: 12/26/15  4:12 PM  Result Value Ref Range   ABO/RH(D) A POS   Glucose, capillary     Status: Abnormal   Collection Time: 12/28/15  7:40 PM  Result Value Ref Range   Glucose-Capillary 116 (H) 65 - 99 mg/dL   Comment 1 Capillary Specimen    Comment 2 Notify RN   Glucose, capillary     Status: Abnormal   Collection Time: 12/29/15 12:01 AM  Result Value Ref Range   Glucose-Capillary 135 (H) 65 - 99 mg/dL   Comment 1 Capillary Specimen    Comment 2 Notify RN   I-STAT 3, arterial blood gas (G3+)     Status: Abnormal   Collection Time: 12/29/15  3:54 AM  Result Value Ref Range   pH, Arterial 7.361 7.350 - 7.450   pCO2 arterial 45.5 (H) 35.0 - 45.0  mmHg   pO2, Arterial 88.0 80.0 - 100.0 mmHg   Bicarbonate 25.8 (H) 20.0 - 24.0 mEq/L   TCO2 27 0 - 100 mmol/L   O2 Saturation 96.0 %   Patient temperature 98.7 F    Collection site ARTERIAL LINE    Drawn by Nurse    Sample type ARTERIAL   CBC     Status: Abnormal   Collection Time: 12/29/15  4:15 AM  Result Value Ref Range   WBC 6.6 4.0 - 10.5 K/uL   RBC 4.09 3.87 - 5.11 MIL/uL   Hemoglobin 11.4 (L) 12.0 - 15.0 g/dL   HCT 36.2 36.0 - 46.0 %   MCV 88.5 78.0 - 100.0 fL   MCH 27.9 26.0 - 34.0 pg   MCHC 31.5 30.0 - 36.0 g/dL   RDW 14.4 11.5 - 15.5 %   Platelets 272 150 - 400 K/uL  Basic metabolic panel     Status: Abnormal   Collection Time: 12/29/15  4:15 AM  Result Value Ref Range  Sodium 136 135 - 145 mmol/L   Potassium 4.0 3.5 - 5.1 mmol/L   Chloride 105 101 - 111 mmol/L   CO2 26 22 - 32 mmol/L   Glucose, Bld 121 (H) 65 - 99 mg/dL   BUN 6 6 - 20 mg/dL   Creatinine, Ser 0.57 0.44 - 1.00 mg/dL   Calcium 8.7 (L) 8.9 - 10.3 mg/dL   GFR calc non Af Amer >60 >60 mL/min   GFR calc Af Amer >60 >60 mL/min    Comment: (NOTE) The eGFR has been calculated using the CKD EPI equation. This calculation has not been validated in all clinical situations. eGFR's persistently <60 mL/min signify possible Chronic Kidney Disease.    Anion gap 5 5 - 15  Glucose, capillary     Status: Abnormal   Collection Time: 12/29/15  6:16 AM  Result Value Ref Range   Glucose-Capillary 118 (H) 65 - 99 mg/dL   Comment 1 Arterial Specimen    Comment 2 Notify RN   Glucose, capillary     Status: None   Collection Time: 12/29/15 12:35 PM  Result Value Ref Range   Glucose-Capillary 93 65 - 99 mg/dL   Comment 1 Capillary Specimen    Comment 2 Notify RN   Glucose, capillary     Status: Abnormal   Collection Time: 12/29/15  6:31 PM  Result Value Ref Range   Glucose-Capillary 102 (H) 65 - 99 mg/dL   Comment 1 Capillary Specimen    Comment 2 Notify RN   Glucose, capillary     Status: Abnormal    Collection Time: 12/29/15 11:36 PM  Result Value Ref Range   Glucose-Capillary 136 (H) 65 - 99 mg/dL   Comment 1 Capillary Specimen    Comment 2 Notify RN   CBC     Status: Abnormal   Collection Time: 12/30/15  3:20 AM  Result Value Ref Range   WBC 5.9 4.0 - 10.5 K/uL   RBC 4.02 3.87 - 5.11 MIL/uL   Hemoglobin 11.3 (L) 12.0 - 15.0 g/dL   HCT 36.3 36.0 - 46.0 %   MCV 90.3 78.0 - 100.0 fL   MCH 28.1 26.0 - 34.0 pg   MCHC 31.1 30.0 - 36.0 g/dL   RDW 14.5 11.5 - 15.5 %   Platelets 255 150 - 400 K/uL  Comprehensive metabolic panel     Status: Abnormal   Collection Time: 12/30/15  3:20 AM  Result Value Ref Range   Sodium 138 135 - 145 mmol/L   Potassium 3.7 3.5 - 5.1 mmol/L   Chloride 106 101 - 111 mmol/L   CO2 27 22 - 32 mmol/L   Glucose, Bld 100 (H) 65 - 99 mg/dL   BUN 6 6 - 20 mg/dL   Creatinine, Ser 0.66 0.44 - 1.00 mg/dL   Calcium 8.7 (L) 8.9 - 10.3 mg/dL   Total Protein 6.3 (L) 6.5 - 8.1 g/dL   Albumin 3.0 (L) 3.5 - 5.0 g/dL   AST 17 15 - 41 U/L   ALT 17 14 - 54 U/L   Alkaline Phosphatase 44 38 - 126 U/L   Total Bilirubin 0.5 0.3 - 1.2 mg/dL   GFR calc non Af Amer >60 >60 mL/min   GFR calc Af Amer >60 >60 mL/min    Comment: (NOTE) The eGFR has been calculated using the CKD EPI equation. This calculation has not been validated in all clinical situations. eGFR's persistently <60 mL/min signify possible Chronic Kidney Disease.  Anion gap 5 5 - 15  Glucose, capillary     Status: Abnormal   Collection Time: 12/30/15  1:07 PM  Result Value Ref Range   Glucose-Capillary 124 (H) 65 - 99 mg/dL   Comment 1 Capillary Specimen    Comment 2 Notify RN   Glucose, capillary     Status: None   Collection Time: 12/30/15  6:49 PM  Result Value Ref Range   Glucose-Capillary 84 65 - 99 mg/dL  Glucose, capillary     Status: Abnormal   Collection Time: 12/31/15 12:08 AM  Result Value Ref Range   Glucose-Capillary 116 (H) 65 - 99 mg/dL   Comment 1 Notify RN      Assessment/Plan: Streptococcal sore throat Rapid strep +. + erythema and exudate. Will begin ABX. Rx Amoxicillin. Supportive measures and OTC pain medication reviewed. FU PRN if symptoms are not resolving.    Leeanne Rio, PA-C

## 2016-03-14 ENCOUNTER — Ambulatory Visit: Payer: 59 | Admitting: Physician Assistant

## 2016-03-14 ENCOUNTER — Encounter: Payer: Self-pay | Admitting: Physician Assistant

## 2016-03-14 ENCOUNTER — Ambulatory Visit (INDEPENDENT_AMBULATORY_CARE_PROVIDER_SITE_OTHER): Payer: 59 | Admitting: Physician Assistant

## 2016-03-14 VITALS — BP 102/64 | HR 104 | Temp 101.0°F | Wt 230.6 lb

## 2016-03-14 DIAGNOSIS — R509 Fever, unspecified: Secondary | ICD-10-CM | POA: Diagnosis not present

## 2016-03-14 DIAGNOSIS — B9689 Other specified bacterial agents as the cause of diseases classified elsewhere: Secondary | ICD-10-CM

## 2016-03-14 DIAGNOSIS — J019 Acute sinusitis, unspecified: Secondary | ICD-10-CM | POA: Diagnosis not present

## 2016-03-14 LAB — CBC WITH DIFFERENTIAL/PLATELET
BASOS PCT: 0.3 % (ref 0.0–3.0)
Basophils Absolute: 0 10*3/uL (ref 0.0–0.1)
EOS ABS: 0.2 10*3/uL (ref 0.0–0.7)
EOS PCT: 1.8 % (ref 0.0–5.0)
HCT: 42.4 % (ref 36.0–46.0)
HEMOGLOBIN: 14.1 g/dL (ref 12.0–15.0)
LYMPHS ABS: 1.9 10*3/uL (ref 0.7–4.0)
Lymphocytes Relative: 19.3 % (ref 12.0–46.0)
MCHC: 33.3 g/dL (ref 30.0–36.0)
MCV: 88.6 fl (ref 78.0–100.0)
MONO ABS: 0.7 10*3/uL (ref 0.1–1.0)
Monocytes Relative: 6.9 % (ref 3.0–12.0)
NEUTROS PCT: 71.7 % (ref 43.0–77.0)
Neutro Abs: 7.1 10*3/uL (ref 1.4–7.7)
PLATELETS: 272 10*3/uL (ref 150.0–400.0)
RBC: 4.79 Mil/uL (ref 3.87–5.11)
RDW: 15.4 % (ref 11.5–15.5)
WBC: 9.9 10*3/uL (ref 4.0–10.5)

## 2016-03-14 LAB — POCT INFLUENZA A/B
INFLUENZA A, POC: NEGATIVE
INFLUENZA B, POC: NEGATIVE

## 2016-03-14 MED ORDER — AMOXICILLIN-POT CLAVULANATE 875-125 MG PO TABS: 1.0000 | ORAL_TABLET | Freq: Two times a day (BID) | ORAL | 0 refills | Status: DC

## 2016-03-14 MED ORDER — BENZONATATE 100 MG PO CAPS: 100.0000 mg | ORAL_CAPSULE | Freq: Three times a day (TID) | ORAL | 0 refills | Status: DC | PRN

## 2016-03-14 NOTE — Progress Notes (Signed)
Pre visit review using our clinic review tool, if applicable. No additional management support is needed unless otherwise documented below in the visit note. 

## 2016-03-14 NOTE — Addendum Note (Signed)
Addended by: Ewing Schlein on: 03/14/2016 11:44 AM   Modules accepted: Orders

## 2016-03-14 NOTE — Patient Instructions (Signed)
Please stop the Amoxicillin. Start the Augmentin as directed with food. Stay well-hydrated and get plenty of rest.  Use the Tessalon as directed for cough. You may also use Mucinex to help break up congestion. Place a humidifier in the bedroom.  Stop by the lab for a blood count. I am checking because your BP was a bit lower than normal.

## 2016-03-14 NOTE — Progress Notes (Signed)
Patient presents to clinic today c/o worsening cough that has now become productive of thick sputum, along with sinus pressure and sinus pain in maxillary region. Denies ear pressure. Notes fever last night that has resolved. Was seen 2 days ago for 1 day of sore throat. Tested positive for strep and was started on Amoxicillin. Notes sore throat is improving but other symptoms remain. Denies sick contact or recent travel. Denies other new or worsening symptoms.  Past Medical History:  Diagnosis Date  . Abnormal Pap smear 2006   leep  . Anxiety   . Arthritis    osteoarthritis  . Complication of anesthesia    [facility], HAD ??  SEIZURE/ SHAKING  . GERD (gastroesophageal reflux disease)   . Headache   . Herpes    never had outbreak. pos per blood, doesn't know which type  . History of chicken pox   . Polycystic ovarian syndrome   . Shortness of breath dyspnea    W/ EXERTION   . SVD (spontaneous vaginal delivery)    x 1  . Varicose vein of leg     Current Outpatient Prescriptions on File Prior to Visit  Medication Sig Dispense Refill  . amoxicillin (AMOXIL) 500 MG capsule Take 1 capsule (500 mg total) by mouth 2 (two) times daily. 20 capsule 0  . ibuprofen (ADVIL,MOTRIN) 100 MG tablet Take 100 mg by mouth every 6 (six) hours as needed for fever. Reported on 10/01/2015    . traMADol (ULTRAM) 50 MG tablet Take 1 tablet (50 mg total) by mouth every 6 (six) hours as needed. 40 tablet 0  . Vitamin D, Ergocalciferol, (DRISDOL) 50000 units CAPS capsule Take 1 capsule (50,000 Units total) by mouth every 7 (seven) days. 12 capsule 0   No current facility-administered medications on file prior to visit.     Allergies  Allergen Reactions  . Fentanyl Itching    Benadryl effective for itching.    Family History  Problem Relation Age of Onset  . Diabetes Maternal Grandmother   . Heart disease Maternal Grandmother   . Hypertension Maternal Grandfather   . Cancer Maternal  Grandfather 70    bone, colon   . Diabetes Maternal Grandfather   . Diabetes Mother   . Cancer Mother 67    breast cancer  . Anesthesia problems Neg Hx   . Hypertension Father     Social History   Social History  . Marital status: Married    Spouse name: N/A  . Number of children: N/A  . Years of education: N/A   Social History Main Topics  . Smoking status: Never Smoker  . Smokeless tobacco: Never Used  . Alcohol use 0.0 oz/week     Comment: OCC  . Drug use: No  . Sexual activity: Yes    Birth control/ protection: None   Other Topics Concern  . None   Social History Narrative  . None   Review of Systems - See HPI.  All other ROS are negative.  BP (!) 90/56 (BP Location: Left Arm, Patient Position: Sitting, Cuff Size: Normal)   Pulse (!) 104   Temp 98.6 F (37 C) (Oral)   Wt 230 lb 9.6 oz (104.6 kg)   LMP 02/12/2016   SpO2 98%   BMI 36.12 kg/m   Physical Exam  Constitutional: She is oriented to person, place, and time and well-developed, well-nourished, and in no distress.  HENT:  Head: Normocephalic and atraumatic.  Right Ear: Tympanic  membrane normal.  Left Ear: Tympanic membrane normal.  Nose: Right sinus exhibits maxillary sinus tenderness. Left sinus exhibits maxillary sinus tenderness.  Mouth/Throat: Uvula is midline and mucous membranes are normal. Posterior oropharyngeal erythema present. No oropharyngeal exudate or tonsillar abscesses.  Eyes: Conjunctivae are normal.  Neck: Neck supple.  Cardiovascular: Normal rate, regular rhythm, normal heart sounds and intact distal pulses.   Pulmonary/Chest: Effort normal and breath sounds normal. No respiratory distress. She has no wheezes. She has no rales. She exhibits no tenderness.  Neurological: She is alert and oriented to person, place, and time.  Skin: Skin is warm and dry. No rash noted.  Vitals reviewed.   Recent Results (from the past 2160 hour(s))  Surgical pcr screen     Status: None    Collection Time: 12/26/15  3:55 PM  Result Value Ref Range   MRSA, PCR NEGATIVE NEGATIVE   Staphylococcus aureus NEGATIVE NEGATIVE    Comment:        The Xpert SA Assay (FDA approved for NASAL specimens in patients over 76 years of age), is one component of a comprehensive surveillance program.  Test performance has been validated by [facility] for patients greater than or equal to 42 year old. It is not intended to diagnose infection nor to guide or monitor treatment.   hCG, serum, qualitative     Status: None   Collection Time: 12/26/15  3:55 PM  Result Value Ref Range   Preg, Serum NEGATIVE NEGATIVE    Comment:        THE SENSITIVITY OF THIS METHODOLOGY IS >10 mIU/mL.   APTT     Status: None   Collection Time: 12/26/15  3:55 PM  Result Value Ref Range   aPTT 25 24 - 37 seconds  Blood gas, arterial on room air     Status: Abnormal   Collection Time: 12/26/15  3:55 PM  Result Value Ref Range   FIO2 0.21    pH, Arterial 7.426 7.350 - 7.450   pCO2 arterial 38.0 35.0 - 45.0 mmHg   pO2, Arterial 91.7 80.0 - 100.0 mmHg   Bicarbonate 24.5 (H) 20.0 - 24.0 mEq/L   TCO2 25.7 0 - 100 mmol/L   Acid-Base Excess 0.6 0.0 - 2.0 mmol/L   O2 Saturation 97.0 %   Patient temperature 98.6    Collection site LEFT BRACHIAL    Drawn by COLLECTED BY LABORATORY    Sample type ARTERIAL DRAW    Allens test (pass/fail) PASS PASS  CBC     Status: None   Collection Time: 12/26/15  3:55 PM  Result Value Ref Range   WBC 4.8 4.0 - 10.5 K/uL   RBC 4.59 3.87 - 5.11 MIL/uL   Hemoglobin 12.9 12.0 - 15.0 g/dL   HCT 40.2 36.0 - 46.0 %   MCV 87.6 78.0 - 100.0 fL   MCH 28.1 26.0 - 34.0 pg   MCHC 32.1 30.0 - 36.0 g/dL   RDW 14.4 11.5 - 15.5 %   Platelets 270 150 - 400 K/uL  Comprehensive metabolic panel     Status: None   Collection Time: 12/26/15  3:55 PM  Result Value Ref Range   Sodium 138 135 - 145 mmol/L   Potassium 4.0 3.5 - 5.1 mmol/L   Chloride 110 101 - 111 mmol/L   CO2 22 22 - 32  mmol/L   Glucose, Bld 95 65 - 99 mg/dL   BUN 10 6 - 20 mg/dL   Creatinine, Ser 0.54 0.44 -  1.00 mg/dL   Calcium 9.4 8.9 - 10.3 mg/dL   Total Protein 6.9 6.5 - 8.1 g/dL   Albumin 3.6 3.5 - 5.0 g/dL   AST 18 15 - 41 U/L   ALT 18 14 - 54 U/L   Alkaline Phosphatase 48 38 - 126 U/L   Total Bilirubin 0.5 0.3 - 1.2 mg/dL   GFR calc non Af Amer >60 >60 mL/min   GFR calc Af Amer >60 >60 mL/min    Comment: (NOTE) The eGFR has been calculated using the CKD EPI equation. This calculation has not been validated in all clinical situations. eGFR's persistently <60 mL/min signify possible Chronic Kidney Disease.    Anion gap 6 5 - 15  Protime-INR     Status: None   Collection Time: 12/26/15  3:55 PM  Result Value Ref Range   Prothrombin Time 13.5 11.6 - 15.2 seconds   INR 1.01 0.00 - 1.49  Urinalysis, Routine w reflex microscopic (not at [facility])     Status: None   Collection Time: 12/26/15  3:55 PM  Result Value Ref Range   Color, Urine YELLOW YELLOW   APPearance CLEAR CLEAR   Specific Gravity, Urine 1.015 1.005 - 1.030   pH 6.0 5.0 - 8.0   Glucose, UA NEGATIVE NEGATIVE mg/dL   Hgb urine dipstick NEGATIVE NEGATIVE   Bilirubin Urine NEGATIVE NEGATIVE   Ketones, ur NEGATIVE NEGATIVE mg/dL   Protein, ur NEGATIVE NEGATIVE mg/dL   Nitrite NEGATIVE NEGATIVE   Leukocytes, UA NEGATIVE NEGATIVE    Comment: MICROSCOPIC NOT DONE ON URINES WITH NEGATIVE PROTEIN, BLOOD, LEUKOCYTES, NITRITE, OR GLUCOSE <1000 mg/dL.  Type and screen     Status: None   Collection Time: 12/26/15  4:12 PM  Result Value Ref Range   ABO/RH(D) A POS    Antibody Screen NEG    Sample Expiration 01/09/2016    Extend sample reason NO TRANSFUSIONS OR PREGNANCY IN THE PAST 3 MONTHS   ABO/Rh     Status: None   Collection Time: 12/26/15  4:12 PM  Result Value Ref Range   ABO/RH(D) A POS   Glucose, capillary     Status: Abnormal   Collection Time: 12/28/15  7:40 PM  Result Value Ref Range   Glucose-Capillary 116 (H) 65 - 99  mg/dL   Comment 1 Capillary Specimen    Comment 2 Notify RN   Glucose, capillary     Status: Abnormal   Collection Time: 12/29/15 12:01 AM  Result Value Ref Range   Glucose-Capillary 135 (H) 65 - 99 mg/dL   Comment 1 Capillary Specimen    Comment 2 Notify RN   I-STAT 3, arterial blood gas (G3+)     Status: Abnormal   Collection Time: 12/29/15  3:54 AM  Result Value Ref Range   pH, Arterial 7.361 7.350 - 7.450   pCO2 arterial 45.5 (H) 35.0 - 45.0 mmHg   pO2, Arterial 88.0 80.0 - 100.0 mmHg   Bicarbonate 25.8 (H) 20.0 - 24.0 mEq/L   TCO2 27 0 - 100 mmol/L   O2 Saturation 96.0 %   Patient temperature 98.7 F    Collection site ARTERIAL LINE    Drawn by Nurse    Sample type ARTERIAL   CBC     Status: Abnormal   Collection Time: 12/29/15  4:15 AM  Result Value Ref Range   WBC 6.6 4.0 - 10.5 K/uL   RBC 4.09 3.87 - 5.11 MIL/uL   Hemoglobin 11.4 (L) 12.0 - 15.0 g/dL  HCT 36.2 36.0 - 46.0 %   MCV 88.5 78.0 - 100.0 fL   MCH 27.9 26.0 - 34.0 pg   MCHC 31.5 30.0 - 36.0 g/dL   RDW 14.4 11.5 - 15.5 %   Platelets 272 150 - 400 K/uL  Basic metabolic panel     Status: Abnormal   Collection Time: 12/29/15  4:15 AM  Result Value Ref Range   Sodium 136 135 - 145 mmol/L   Potassium 4.0 3.5 - 5.1 mmol/L   Chloride 105 101 - 111 mmol/L   CO2 26 22 - 32 mmol/L   Glucose, Bld 121 (H) 65 - 99 mg/dL   BUN 6 6 - 20 mg/dL   Creatinine, Ser 0.57 0.44 - 1.00 mg/dL   Calcium 8.7 (L) 8.9 - 10.3 mg/dL   GFR calc non Af Amer >60 >60 mL/min   GFR calc Af Amer >60 >60 mL/min    Comment: (NOTE) The eGFR has been calculated using the CKD EPI equation. This calculation has not been validated in all clinical situations. eGFR's persistently <60 mL/min signify possible Chronic Kidney Disease.    Anion gap 5 5 - 15  Glucose, capillary     Status: Abnormal   Collection Time: 12/29/15  6:16 AM  Result Value Ref Range   Glucose-Capillary 118 (H) 65 - 99 mg/dL   Comment 1 Arterial Specimen    Comment 2  Notify RN   Glucose, capillary     Status: None   Collection Time: 12/29/15 12:35 PM  Result Value Ref Range   Glucose-Capillary 93 65 - 99 mg/dL   Comment 1 Capillary Specimen    Comment 2 Notify RN   Glucose, capillary     Status: Abnormal   Collection Time: 12/29/15  6:31 PM  Result Value Ref Range   Glucose-Capillary 102 (H) 65 - 99 mg/dL   Comment 1 Capillary Specimen    Comment 2 Notify RN   Glucose, capillary     Status: Abnormal   Collection Time: 12/29/15 11:36 PM  Result Value Ref Range   Glucose-Capillary 136 (H) 65 - 99 mg/dL   Comment 1 Capillary Specimen    Comment 2 Notify RN   CBC     Status: Abnormal   Collection Time: 12/30/15  3:20 AM  Result Value Ref Range   WBC 5.9 4.0 - 10.5 K/uL   RBC 4.02 3.87 - 5.11 MIL/uL   Hemoglobin 11.3 (L) 12.0 - 15.0 g/dL   HCT 36.3 36.0 - 46.0 %   MCV 90.3 78.0 - 100.0 fL   MCH 28.1 26.0 - 34.0 pg   MCHC 31.1 30.0 - 36.0 g/dL   RDW 14.5 11.5 - 15.5 %   Platelets 255 150 - 400 K/uL  Comprehensive metabolic panel     Status: Abnormal   Collection Time: 12/30/15  3:20 AM  Result Value Ref Range   Sodium 138 135 - 145 mmol/L   Potassium 3.7 3.5 - 5.1 mmol/L   Chloride 106 101 - 111 mmol/L   CO2 27 22 - 32 mmol/L   Glucose, Bld 100 (H) 65 - 99 mg/dL   BUN 6 6 - 20 mg/dL   Creatinine, Ser 0.66 0.44 - 1.00 mg/dL   Calcium 8.7 (L) 8.9 - 10.3 mg/dL   Total Protein 6.3 (L) 6.5 - 8.1 g/dL   Albumin 3.0 (L) 3.5 - 5.0 g/dL   AST 17 15 - 41 U/L   ALT 17 14 - 54 U/L   Alkaline  Phosphatase 44 38 - 126 U/L   Total Bilirubin 0.5 0.3 - 1.2 mg/dL   GFR calc non Af Amer >60 >60 mL/min   GFR calc Af Amer >60 >60 mL/min    Comment: (NOTE) The eGFR has been calculated using the CKD EPI equation. This calculation has not been validated in all clinical situations. eGFR's persistently <60 mL/min signify possible Chronic Kidney Disease.    Anion gap 5 5 - 15  Glucose, capillary     Status: Abnormal   Collection Time: 12/30/15  1:07 PM    Result Value Ref Range   Glucose-Capillary 124 (H) 65 - 99 mg/dL   Comment 1 Capillary Specimen    Comment 2 Notify RN   Glucose, capillary     Status: None   Collection Time: 12/30/15  6:49 PM  Result Value Ref Range   Glucose-Capillary 84 65 - 99 mg/dL  Glucose, capillary     Status: Abnormal   Collection Time: 12/31/15 12:08 AM  Result Value Ref Range   Glucose-Capillary 116 (H) 65 - 99 mg/dL   Comment 1 Notify RN   POCT rapid strep A     Status: Abnormal   Collection Time: 03/12/16  1:02 PM  Result Value Ref Range   Rapid Strep A Screen Positive (A) Negative    Assessment/Plan: 1. Acute bacterial sinusitis Flu swab negative. + TTP of sinuses. Throat exam improved from last check when Dx of strep made (2 days ago). Will check CBC today. Will stop Amoxicillin and begin Augmentin for sinus coverage. Rx Tessalon. OTC medications and supportive measures reviewed with patient.   - CBC w/Diff - amoxicillin-clavulanate (AUGMENTIN) 875-125 MG tablet; Take 1 tablet by mouth 2 (two) times daily.  Dispense: 20 tablet; Refill: 0   Leeanne Rio, [place]

## 2016-03-20 ENCOUNTER — Telehealth: Payer: Self-pay | Admitting: Emergency Medicine

## 2016-03-20 NOTE — Telephone Encounter (Signed)
Called spoke with patient and advised her of CY's recommendations as mentioned below.  Pt voiced her understanding but stated she is already on prednisone and saline nasal rinses.  Pt stated she is okay to continue her current regimen.  She did however mention pain at her surgical site (biopsy done by Dr. Prescott Gum on 12/28/15) that is new x2 days with sharp discomfort.  Pt stated she wondered if the pain was from the coughing and sneezing but would like to know CY's thoughts on this.  She denies any redness to the surgical site, swelling, drainage.

## 2016-03-20 NOTE — Telephone Encounter (Signed)
Coughing will aggravate a healing incision. If the pain gets worse, she should check with her surgeon.  If her nasal problems persist, then I recommend she see an ENT for evaluation.

## 2016-03-20 NOTE — Telephone Encounter (Signed)
Suggest prednisone 10 mg, # 5, 1 daily to reduce inflammation  Suggest nasal saline rinse with squeeze bottle or Neti pot once a day for 3-4 days as needed

## 2016-03-20 NOTE — Telephone Encounter (Signed)
Called spoke with patient and informed her of CY's recommendations as stated below  Pt voiced her understanding and denied any further questions/concerns Nothing further needed at this time; will sign off

## 2016-03-20 NOTE — Telephone Encounter (Signed)
Spoke with pt. States that she is having a flare up. Reports sneezing, coughing and complaining of an ammonia smell in her nose. Cough and sinuses are producing clear mucus. Was diagnosed with strep and an URI last week and was put on Augmentin. Would like recommendations.  Allergies  Allergen Reactions  . Fentanyl Itching    Benadryl effective for itching.   Current Outpatient Prescriptions on File Prior to Visit  Medication Sig Dispense Refill  . amoxicillin-clavulanate (AUGMENTIN) 875-125 MG tablet Take 1 tablet by mouth 2 (two) times daily. 20 tablet 0  . benzonatate (TESSALON) 100 MG capsule Take 1 capsule (100 mg total) by mouth 3 (three) times daily as needed. 30 capsule 0  . ibuprofen (ADVIL,MOTRIN) 100 MG tablet Take 100 mg by mouth every 6 (six) hours as needed for fever. Reported on 10/01/2015    . traMADol (ULTRAM) 50 MG tablet Take 1 tablet (50 mg total) by mouth every 6 (six) hours as needed. 40 tablet 0  . Vitamin D, Ergocalciferol, (DRISDOL) 50000 units CAPS capsule Take 1 capsule (50,000 Units total) by mouth every 7 (seven) days. 12 capsule 0   No current facility-administered medications on file prior to visit.     CY - please advise as RB is on vacation. Thanks.

## 2016-04-28 ENCOUNTER — Ambulatory Visit (INDEPENDENT_AMBULATORY_CARE_PROVIDER_SITE_OTHER): Payer: 59 | Admitting: Family

## 2016-04-28 ENCOUNTER — Encounter: Payer: Self-pay | Admitting: Family

## 2016-04-28 VITALS — BP 135/82 | HR 62 | Temp 98.0°F | Resp 18 | Ht 67.0 in | Wt 236.6 lb

## 2016-04-28 DIAGNOSIS — N912 Amenorrhea, unspecified: Secondary | ICD-10-CM

## 2016-04-28 LAB — POCT URINE PREGNANCY: Preg Test, Ur: NEGATIVE

## 2016-04-28 NOTE — Patient Instructions (Signed)
Please complete lab work prior to leaving.   

## 2016-04-28 NOTE — Progress Notes (Signed)
Subjective:    Patient ID: Brenda Williams, female    DOB: [birth year], 35 y.o.   MRN: [id number]  HPI  Brenda Williams is a 35 yr old female who presents today to discuss a positive home pregnancy test. Has had 2 positive tests and 1 negative test at home. LMP was about 2 month ago.  She is unsure of the exact date. She reports mild breast tenderness/dizziness/fatigue.  Denies abdominal pain.   Review of Systems See HPI  Past Medical History:  Diagnosis Date  . Abnormal Pap smear 2006   leep  . Anxiety   . Arthritis    osteoarthritis  . Complication of anesthesia    [facility], HAD ??  SEIZURE/ SHAKING  . GERD (gastroesophageal reflux disease)   . Headache   . Herpes    never had outbreak. pos per blood, doesn't know which type  . History of chicken pox   . Polycystic ovarian syndrome   . Shortness of breath dyspnea    W/ EXERTION   . SVD (spontaneous vaginal delivery)    x 1  . Varicose vein of leg      Social History   Social History  . Marital status: Married    Spouse name: N/A  . Number of children: N/A  . Years of education: N/A   Occupational History  . Not on file.   Social History Main Topics  . Smoking status: Never Smoker  . Smokeless tobacco: Never Used  . Alcohol use 0.0 oz/week     Comment: OCC  . Drug use: No  . Sexual activity: Yes    Birth control/ protection: None   Other Topics Concern  . Not on file   Social History Narrative  . No narrative on file    Past Surgical History:  Procedure Laterality Date  . DILATION AND EVACUATION N/A 09/14/2012   Procedure: DILATATION AND EVACUATION;  Surgeon: Allena Katz, MD;  Location: [facility];  Service: Gynecology;  Laterality: N/A;  . HYSTEROSCOPY  2009   uterine polyp  . IVF Retrival  2010, 2011  . KNEE SURGERY  1998   right knee  . LEEP  2006  . LUNG BIOPSY Right 12/28/2015   Procedure: RIGHT LUNG BIOPSY;  Surgeon: Ivin Poot, MD;  Location: [facility];  Service: Thoracic;   Laterality: Right;  . SPHINCTEROTOMY  2010  . uterine polyp removal    . VIDEO ASSISTED THORACOSCOPY Right 12/28/2015   Procedure: RIGHT VIDEO ASSISTED THORACOSCOPY;  Surgeon: Ivin Poot, MD;  Location: [facility];  Service: Thoracic;  Laterality: Right;    Family History  Problem Relation Age of Onset  . Diabetes Maternal Grandmother   . Heart disease Maternal Grandmother   . Hypertension Maternal Grandfather   . Cancer Maternal Grandfather 70    bone, colon   . Diabetes Maternal Grandfather   . Diabetes Mother   . Cancer Mother 68    breast cancer  . Anesthesia problems Neg Hx   . Hypertension Father     Allergies  Allergen Reactions  . Fentanyl Itching    Benadryl effective for itching.    No current outpatient prescriptions on file prior to visit.   No current facility-administered medications on file prior to visit.     BP 135/82 (BP Location: Right Arm, Patient Position: Sitting, Cuff Size: Large)   Pulse 62   Temp 98 F (36.7 C)   Resp 18   Ht  5\' 7"  (1.702 m)   Wt 236 lb 9.6 oz (107.3 kg)   LMP 02/27/2016   SpO2 100%   BMI 37.06 kg/m       Objective:   Physical Exam  Constitutional: She is oriented to person, place, and time. She appears well-developed and well-nourished. No distress.  Neurological: She is alert and oriented to person, place, and time.  Skin: Skin is warm and dry.  Psychiatric: She has a normal mood and affect. Her behavior is normal. Judgment and thought content normal.          Assessment & Plan:  Amenorrhea- urine HCG negative here in the office.  Will confirm with serum HCG. She is advised that she may have experienced miscarriage. She is advised to let us know if she develops abdominal pain, heavy bleeding.  Pt verbalizes understanding.

## 2016-04-28 NOTE — Progress Notes (Signed)
Pre visit review using our clinic review tool, if applicable. No additional management support is needed unless otherwise documented below in the visit note. 

## 2016-04-29 ENCOUNTER — Encounter: Payer: Self-pay | Admitting: Family

## 2016-04-29 LAB — HCG, SERUM, QUALITATIVE: Preg, Serum: NEGATIVE

## 2016-05-26 ENCOUNTER — Encounter: Payer: Self-pay | Admitting: Emergency Medicine

## 2016-06-19 ENCOUNTER — Inpatient Hospital Stay: Admission: RE | Admit: 2016-06-19 | Payer: 59 | Source: Ambulatory Visit

## 2016-06-19 ENCOUNTER — Ambulatory Visit (HOSPITAL_BASED_OUTPATIENT_CLINIC_OR_DEPARTMENT_OTHER)
Admission: RE | Admit: 2016-06-19 | Discharge: 2016-06-19 | Disposition: A | Discharge status: Home / Self Care | Payer: 59 | Source: Ambulatory Visit | Attending: Emergency Medicine | Admitting: Emergency Medicine

## 2016-06-19 DIAGNOSIS — J8481 Lymphangioleiomyomatosis: Secondary | ICD-10-CM | POA: Insufficient documentation

## 2016-06-19 DIAGNOSIS — R911 Solitary pulmonary nodule: Secondary | ICD-10-CM | POA: Diagnosis not present

## 2016-06-23 ENCOUNTER — Telehealth: Payer: Self-pay | Admitting: Emergency Medicine

## 2016-06-23 DIAGNOSIS — R0600 Dyspnea, unspecified: Secondary | ICD-10-CM

## 2016-06-23 NOTE — Telephone Encounter (Signed)
  FINDINGS: Cardiovascular: Heart size is mildly enlarged. There is no significant pericardial fluid, thickening or pericardial calcification. No atherosclerotic calcifications are noted in the thoracic aorta or the coronary arteries.  Mediastinum/Nodes: No pathologically enlarged mediastinal or hilar lymph nodes. Please note that accurate exclusion of hilar adenopathy is limited on noncontrast CT scans. Esophagus is unremarkable in appearance. No axillary lymphadenopathy.  Lungs/Pleura: Previously noted right middle lobe pulmonary nodule has increased in size, currently measuring 10 x 8 x 8 mm (axial image 61 of series 5 and coronal image 59 of series 8) as compared with 8 x 6 x 6 mm on prior study 06/28/2015. This slow growth of this nodule and generally benign appearance favors a benign diagnosis such as a hamartoma. No other suspicious appearing pulmonary nodules or masses are noted. Postoperative changes of wedge resection in the periphery of the right upper lobe. No acute consolidative airspace disease. No pleural effusions. Inspiratory and expiratory imaging is unremarkable.  Upper Abdomen: Unremarkable.  Musculoskeletal: There are no aggressive appearing lytic or blastic lesions noted in the visualized portions of the skeleton.  IMPRESSION: 1. 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 2. 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   Electronically Signed   By: Vinnie Langton M.D.   On: 06/19/2016 16:06   RB please advise on results.

## 2016-06-25 NOTE — Telephone Encounter (Signed)
[phone] pt calling back we can try this first number which is her Cell or call the other one which is her desk    [phone]

## 2016-06-25 NOTE — Telephone Encounter (Signed)
RB  Please advise  A previous message was sent to you about this pt. She was just calling back on her previous message. She stated she saw the results of her CT scan on MyChart but does not know what it means. She was requesting a call from you.

## 2016-06-25 NOTE — Telephone Encounter (Signed)
Pt scheduled for pft and rov in march 2018. While on the phone, pt mentioned that she had been told her heart was enlarged, and wanted to know if any follow up regarding this was necessary at this time.  Rb please advise.  Thanks.

## 2016-06-25 NOTE — Telephone Encounter (Signed)
Spoke with the pt today, reviewed her CT chest which is for the most part stable. She notes some increased exertional dyspnea, but also notes that she has decreased her workout routine and lost some of her conditioning post-op VATS. I have recommended that we follow up PFT in march 2018, have OV in march to review. basde on her clinical status, PFT we ide whether we need to start sirolimus.   Please schedule PFT and her OV. Thanks.

## 2016-06-26 NOTE — Telephone Encounter (Signed)
Spoke with pt, aware of recs.  Echo ordered.  Nothing further needed.

## 2016-06-26 NOTE — Telephone Encounter (Signed)
Please let her know that we will order a repeat echocardiogram to compare with her prior.   Order complete echo.

## 2016-07-15 ENCOUNTER — Other Ambulatory Visit: Payer: Self-pay

## 2016-07-15 ENCOUNTER — Ambulatory Visit (HOSPITAL_COMMUNITY): Payer: 59 | Attending: Cardiovascular Disease

## 2016-07-15 DIAGNOSIS — I493 Ventricular premature depolarization: Secondary | ICD-10-CM | POA: Diagnosis not present

## 2016-07-15 DIAGNOSIS — E669 Obesity, unspecified: Secondary | ICD-10-CM | POA: Diagnosis not present

## 2016-07-15 DIAGNOSIS — I371 Nonrheumatic pulmonary valve insufficiency: Secondary | ICD-10-CM | POA: Diagnosis not present

## 2016-07-15 DIAGNOSIS — R0600 Dyspnea, unspecified: Secondary | ICD-10-CM | POA: Insufficient documentation

## 2016-07-15 DIAGNOSIS — Z6837 Body mass index (BMI) 37.0-37.9, adult: Secondary | ICD-10-CM | POA: Diagnosis not present

## 2016-07-18 ENCOUNTER — Telehealth: Payer: Self-pay | Admitting: Emergency Medicine

## 2016-07-18 NOTE — Telephone Encounter (Signed)
RB  Pt. Called in wanting to know the results of her echo.

## 2016-07-21 NOTE — Telephone Encounter (Signed)
lmtcb x1 for pt. 

## 2016-07-21 NOTE — Telephone Encounter (Signed)
Please let her know that her echo shows that her heart is normal size, has normal appearance and function.

## 2016-07-21 NOTE — Telephone Encounter (Signed)
Pt calling back again to get results of echo, please advise  She can be reached [phone].Hillery Hunter

## 2016-07-21 NOTE — Telephone Encounter (Signed)
Message will be routed back to [place] to address.  RB - please advise on Echo results. Thanks.

## 2016-07-22 NOTE — Telephone Encounter (Signed)
Called and spoke with pt and she is aware of results of the echo per RB.

## 2016-08-08 ENCOUNTER — Telehealth: Payer: Self-pay | Admitting: Emergency Medicine

## 2016-08-08 NOTE — Telephone Encounter (Signed)
Spoke with pt, who c/o increased sob, post nasal drip & increased fatigue X 1d. Pt states she was very hot when she woke up this morning, she's unsure if this triggered it. Pt is recommendation. RB please advise.

## 2016-08-08 NOTE — Telephone Encounter (Signed)
It sounds as if she may be developing acute viral symptoms - she is at risk for flu. If sx persist then she likely needs to be seen to undergo flu testing in the next 3-4 days. We can see her here if she would like. I would definitely like to see her here if sx persist to insure acute, unrelated to her underlying lung disease.   Also Ok to take OTC decongestants like tylenol cold and flu or theraflu.

## 2016-08-08 NOTE — Telephone Encounter (Signed)
Pt aware of RB's recommendations & voiced her understanding. Pt states she will contact us on Monday to let us know how she's feeling. I have advised pt to go to urgent care or the ED if her symptoms worsen over the weekend. Nothing further needed.

## 2016-09-16 ENCOUNTER — Other Ambulatory Visit: Payer: Self-pay | Admitting: Emergency Medicine

## 2016-09-16 DIAGNOSIS — R0600 Dyspnea, unspecified: Secondary | ICD-10-CM

## 2016-09-17 ENCOUNTER — Ambulatory Visit (INDEPENDENT_AMBULATORY_CARE_PROVIDER_SITE_OTHER): Payer: 59 | Admitting: Emergency Medicine

## 2016-09-17 ENCOUNTER — Encounter: Payer: Self-pay | Admitting: Emergency Medicine

## 2016-09-17 DIAGNOSIS — R0600 Dyspnea, unspecified: Secondary | ICD-10-CM | POA: Diagnosis not present

## 2016-09-17 DIAGNOSIS — J8481 Lymphangioleiomyomatosis: Secondary | ICD-10-CM

## 2016-09-17 LAB — PULMONARY FUNCTION TEST
DL/VA % pred: 77 %
DL/VA: 4 ml/min/mmHg/L
DLCO COR % PRED: 54 %
DLCO COR: 15.51 ml/min/mmHg
DLCO UNC % PRED: 63 %
DLCO unc: 17.95 ml/min/mmHg
FEF 25-75 POST: 4.49 L/s
FEF 25-75 PRE: 2.3 L/s
FEF2575-%Change-Post: 95 %
FEF2575-%PRED-PRE: 72 %
FEF2575-%Pred-Post: 142 %
FEV1-%Change-Post: 21 %
FEV1-%PRED-POST: 90 %
FEV1-%Pred-Pre: 74 %
FEV1-POST: 2.59 L
FEV1-Pre: 2.14 L
FEV1FVC-%CHANGE-POST: 2 %
FEV1FVC-%Pred-Pre: 99 %
FEV6-%CHANGE-POST: 17 %
FEV6-%Pred-Post: 88 %
FEV6-%Pred-Pre: 75 %
FEV6-Post: 3.03 L
FEV6-Pre: 2.57 L
FEV6FVC-%Pred-Post: 102 %
FEV6FVC-%Pred-Pre: 102 %
FVC-%Change-Post: 18 %
FVC-%PRED-POST: 87 %
FVC-%Pred-Pre: 74 %
FVC-Post: 3.04 L
FVC-Pre: 2.57 L
POST FEV1/FVC RATIO: 85 %
POST FEV6/FVC RATIO: 100 %
PRE FEV1/FVC RATIO: 83 %
Pre FEV6/FVC Ratio: 100 %
RV % pred: 78 %
RV: 1.32 L
TLC % PRED: 78 %
TLC: 4.33 L

## 2016-09-17 MED ORDER — BUDESONIDE-FORMOTEROL FUMARATE 160-4.5 MCG/ACT IN AERO: 2.0000 | INHALATION_SPRAY | Freq: Two times a day (BID) | RESPIRATORY_TRACT | 0 refills | Status: DC

## 2016-09-17 NOTE — Assessment & Plan Note (Signed)
Increased evidence for obstruction and now with a BD  response on PFT. Increased SOB . I suspect that she is having slow progression of LAM. I believe she is at the point to start sirolimus. She has appropriate hesitation about this . She will think about it, ge tback to me. I offered to send her for 2nd opinion. She is thinking about it. In the meantime we will try symbicort to see if she benefits.

## 2016-09-17 NOTE — Patient Instructions (Addendum)
We will try starting you on Symbicort 2 puffs to see if it benefits your breathing.  We talked today about starting a medication called sirolimus. You are probably at the point when you would benefit from this medication. Think about this and let Dr Lamonte Sakai know if you would like to start this or if you would like to be referred for a second opinion.  Continue to stay in communication with the LAM [employer] and Support Groups.  Follow with Dr Lamonte Sakai next available to review your symptoms.

## 2016-09-17 NOTE — Addendum Note (Signed)
Addended by: Benson Setting L on: 09/17/2016 03:08 PM   Modules accepted: Orders

## 2016-09-17 NOTE — Progress Notes (Signed)
Showed pt how to properly use the Symbicort inhaler. Pt understood and had no further questions

## 2016-09-17 NOTE — Progress Notes (Signed)
PFT done today. 

## 2016-09-17 NOTE — Progress Notes (Signed)
Subjective:    Patient ID: Brenda Williams, female    DOB: [birth year], 36 y.o.   MRN: [id number]  HPI 36 yo woman, never smoker, little PMH. Referred for abnormal CT scan characterized by cystic changes, a single stable RML nodule. Original CT was in 3/'14 after she had anesthesia trouble following d&c. Denies dyspnea except when lying supine. No CP, no cough.   ROV 08/05/13 -- pt follows up for cystic changes and a RML nodule on CT scan chest. She had PFT today that show no AFL but some fatigability, decreased lung volumes and low DLCO that corrects for Va.  Her a1-AT is 110 (low normal) but genotype not reported. She was in the ED yesterday 1/22 for extreme fatigue, some mild SOB, no constitutional sx. She has otherwise been well. No significant cough, sometimes at night.   Acute OV 02/13/14 --  Complains of prod cough with yellow/clear mucus, head congestion w/ yellow mucus, PND, increased SOB, wheezing, fever up to 102 x 6 weeks.  Fevers have resolved. Cough is resolving. Still has some lingering clear mucus.  Reports symptoms are now improved x1-2weeks.   Cough was worse at night, keeping her up at night. That has improved.  Denies any hemoptysis, tightness, leg swelling, chest pain, n/v/d. No abx use.  Went to ER initially on 12/22/13 dx w/ viral syndrome. CXR neg.  Has known pulmonary nodule with planned CT chest 06/2014.   ROV 08/22/14 -- follow-up visit for a right middle lobe pulmonary nodule on CT scan of the chest as well as some mild evidence for emphysematous change versus cystic change on imaging. Spirometry has not confirmed any obstructive disease but she has had short windedness. A repeat CT scan on 06/30/14 showed that her 7 mm right middle lobe nodule is stable in size. She has been exercising more, has been working out more, some ups and downs   ROV 02/26/15 -- follow-up visit for lobe pulmonary nodule and some microcystic disease versus early emphysematous change on her CT scan  of the chest. Pulmonary function testing does not show overt obstruction. She reports that she has been doing fairly well, had a URI a couple weeks ago. She has some nocturnal cough, especially when she sleeps on her R side. She denies any GERD or PND. She had repeat CT scan of the chest performed on  12/26/14 that I personally reviewed. This showed that her right middle lobe pulmonary nodule is stable in size. It has now been unchanged for 2 years and 3 months. She should not need any repeat scans to follow this. She does still have innumerable small cystic lesions that are suggestive of early emphysematous change. She denies any significant dyspnea   Follow up : Microcystic disease and RUL nodule.  Pt returns for 5 month follow up . Pt had a follow up CT chest in Dec 2016 with stable 32mm RUL .  Showed numerous progressive thin walled cystic structures. ? Lymphangioleiomyomatosis .  Alpha 1 was normal with MM  Last PFT was in 2015 was nml .  Previous echo in 2014 was nml .  She gets sob with activity .some dry cough  Denies chest pain, orthopnea, edema or fever.    ROV 10/16/15 -- patient with a history of diffuse multilobar microcystic disease on CT scan of the chest. As part of her evaluation would perform pulmonary function testing today that I have personally reviewed. This showed normal spirometry, slightly decreased total lung capacity consistent with  mild restriction and a decreased diffusion capacity that corrects to the normal range when adjusted for alveolar volume. She has some episodes occasionally when she "breathes hard". Denies any chest pain. She has not had cough, no hemoptysis. She has gained some weight since last visit, has been less active. She is working on getting back in shape. Her VEGF level was normal 06/2015.   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  She feels that her breathing may be a bit  worse with exertion. She has noted the needs to pace herself. She can shop without resting, but quite fatigued after. She does not has cough, no hemoptysis. PFT's today reviewed by me > progressive obstruction, now with a BD response. No hemoptysis, no cough.    Review of Systems As per HPI     Objective:   Physical Exam Vitals:   09/17/16 1349  BP: 122/82  Pulse: 93  SpO2: 97%  Weight: 241 lb 12.8 oz (109.7 kg)  Height: 5\' 7"  (1.702 m)   Body mass index is 37.87 kg/m.    GEN: A/Ox3; pleasant , NAD, obese   HEENT:  OP clear, no lesions  NECK:  No bruit, no stridor, no lymphadenopathy  RESP  Clear bilaterally with no wheezes, crackles  CARD:  RRR, no m/r/g  , no peripheral edema, pulses intact, no cyanosis or clubbing.  Neuro: alert, no focal deficits noted.     CT chest 06/28/15  : No pleural fluid. Numerous, small thin walled cystic structures are identified throughout both lungs and appear progressive when compared with 06/22/2013. The right middle lobe pulmonary nodule measures 8 mm and is unchanged from previous exam, image 24 of series 3. No additional pulmonary nodules or masses Identified.   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 Increased SOB . I suspect that she is having slow progression of LAM. I believe she is at the point to start sirolimus. She has appropriate hesitation about this . She will think about it, ge tback to me. I offered to send her for 2nd opinion. She is thinking about it. In the meantime we will try symbicort to see if she benefits.   Baltazar Apo, MD, PhD 09/17/2016, 2:46 PM [facility] [phone] or if no answer [phone]

## 2016-09-24 ENCOUNTER — Telehealth: Payer: Self-pay | Admitting: Emergency Medicine

## 2016-09-24 DIAGNOSIS — Z5181 Encounter for therapeutic drug level monitoring: Secondary | ICD-10-CM

## 2016-09-24 MED ORDER — SIROLIMUS 1 MG PO TABS: 1.0000 mg | ORAL_TABLET | Freq: Every day | ORAL | 0 refills | Status: DC

## 2016-09-24 NOTE — Telephone Encounter (Signed)
Called and spoke to pt. Informed her of the recs per RB. Sirolimus has been sent to preferred pharmacy, pt aware to start medication on 09/27/16 to assure she can be at lab on the 10th day. Lab order placed for trough level. Pt verbalized understanding and denied any further questions or concerns at this time.

## 2016-09-24 NOTE — Telephone Encounter (Signed)
1 - have her stop the Symbicort to see if side effects improve  2- OK to order sirolimus 1mg  PO qd. She needs to take for the next 10 days and then have a sirolimus trough level drawn in our lab 2 days after the last (10th) dose. We will target trough of <= 10ng/ml. If 1mg  daily gets [place] to that level then we will continue this dose, check again in a month.

## 2016-09-24 NOTE — Telephone Encounter (Signed)
Spoke with pt. States that she has been taking Symbicort and it's causing her to be "winded," fatigued and dry mouth. Pt would like to start the process of starting Sirolimus.  RB - please advise. Thanks.

## 2016-10-07 ENCOUNTER — Other Ambulatory Visit: Payer: 59

## 2016-10-07 DIAGNOSIS — Z5181 Encounter for therapeutic drug level monitoring: Secondary | ICD-10-CM

## 2016-10-08 ENCOUNTER — Telehealth: Payer: Self-pay | Admitting: Emergency Medicine

## 2016-10-08 DIAGNOSIS — J8481 Lymphangioleiomyomatosis: Secondary | ICD-10-CM

## 2016-10-08 NOTE — Telephone Encounter (Signed)
Spoke with pt, requesting sirolimus lab results.  I advised pt that per chart this is still being processed.  Pt expressed understanding.  Pt is concerned because she is out of her medication and is anxious to get this filled if possible.  RB please advise on results as you have them.  Thanks.

## 2016-10-09 LAB — SIROLIMUS LEVEL: Sirolimus (Rapamycin): 1.9 mcg/L — CL (ref 3.0–18.0)

## 2016-10-09 MED ORDER — SIROLIMUS 1 MG PO TABS
1.0000 mg | ORAL_TABLET | Freq: Every day | ORAL | 0 refills | Status: DC
Start: 2016-10-09 — End: 2017-01-29

## 2016-10-09 NOTE — Telephone Encounter (Signed)
Spoke with the pt and made aware RB still waiting on result  She verbalized understanding and states nothing further needed

## 2016-10-09 NOTE — Telephone Encounter (Signed)
let her know I'm still waiting for the result.

## 2016-10-09 NOTE — Telephone Encounter (Signed)
While we are waiting for sirolimus level, just to clarify > I want her to continue same dose until I get the level and she hears differently. Don't stop the medication. Give her more if needed to continue. Thanks.

## 2016-10-09 NOTE — Telephone Encounter (Signed)
Spoke with pt and made her aware of RB's message. Pt understood and needed more medication sent in. The rx was sent to pt's pharmacy of choice. Nothing further is needed. Will leave this message open for RB to document under once results are back

## 2016-10-13 NOTE — Telephone Encounter (Signed)
Please let her know that I have reviewed her trough sirolimus level. It was slightly low, but I would like to continue our same dose for another 2 weeks, then have her come in to have sirolimus level checked as follows >   Stop the med after 2 weeks, 2 days later get the labs checked, then restart the med until we review the results together.

## 2016-10-13 NOTE — Addendum Note (Signed)
Addended by: Rosana Berger on: 10/13/2016 02:20 PM   Modules accepted: Orders

## 2016-10-13 NOTE — Telephone Encounter (Signed)
Spoke with the pt and notified of lab results and recs  She verbalized understanding  I have placed order for the next lab to be drawn

## 2016-11-13 ENCOUNTER — Telehealth: Payer: Self-pay

## 2016-11-13 ENCOUNTER — Ambulatory Visit: Payer: 59 | Admitting: Emergency Medicine

## 2016-11-13 NOTE — Telephone Encounter (Signed)
Called pt because she missed her appointment with Dr. Lamonte Sakai today. I wanted to see if she could reschedule and possibly get her labs drawn before the appointment so her and Dr. Lamonte Sakai can discuss. Left message for pt to call back.

## 2016-11-17 ENCOUNTER — Encounter: Payer: Self-pay | Admitting: Family Medicine

## 2016-11-17 ENCOUNTER — Ambulatory Visit (HOSPITAL_BASED_OUTPATIENT_CLINIC_OR_DEPARTMENT_OTHER)
Admission: RE | Admit: 2016-11-17 | Discharge: 2016-11-17 | Disposition: A | Discharge status: Home / Self Care | Payer: 59 | Source: Ambulatory Visit | Attending: Family Medicine | Admitting: Family Medicine

## 2016-11-17 ENCOUNTER — Other Ambulatory Visit: Payer: Self-pay | Admitting: Family Medicine

## 2016-11-17 ENCOUNTER — Ambulatory Visit (INDEPENDENT_AMBULATORY_CARE_PROVIDER_SITE_OTHER): Payer: 59 | Admitting: Family Medicine

## 2016-11-17 VITALS — BP 122/84 | HR 87 | Temp 98.2°F | Ht 67.0 in | Wt 243.0 lb

## 2016-11-17 DIAGNOSIS — R1030 Lower abdominal pain, unspecified: Secondary | ICD-10-CM

## 2016-11-17 DIAGNOSIS — N83201 Unspecified ovarian cyst, right side: Secondary | ICD-10-CM | POA: Diagnosis not present

## 2016-11-17 DIAGNOSIS — R109 Unspecified abdominal pain: Secondary | ICD-10-CM | POA: Diagnosis not present

## 2016-11-17 DIAGNOSIS — N838 Other noninflammatory disorders of ovary, fallopian tube and broad ligament: Secondary | ICD-10-CM

## 2016-11-17 LAB — POC URINALSYSI DIPSTICK (AUTOMATED)
Bilirubin, UA: NEGATIVE
Glucose, UA: NEGATIVE
KETONES UA: NEGATIVE
Leukocytes, UA: NEGATIVE
Nitrite, UA: NEGATIVE
PROTEIN UA: NEGATIVE
RBC UA: NEGATIVE
SPEC GRAV UA: 1.015 (ref 1.010–1.025)
UROBILINOGEN UA: 0.2 U/dL
pH, UA: 6 (ref 5.0–8.0)

## 2016-11-17 LAB — POCT URINE PREGNANCY: PREG TEST UR: NEGATIVE

## 2016-11-17 NOTE — Progress Notes (Signed)
CT order placed. Pt informed via MyChart.

## 2016-11-17 NOTE — Progress Notes (Signed)
Pre visit review using our clinic review tool, if applicable. No additional management support is needed unless otherwise documented below in the visit note. 

## 2016-11-17 NOTE — Telephone Encounter (Signed)
Please advise.//AB/CMA 

## 2016-11-17 NOTE — Addendum Note (Signed)
Addended by: Harl Bowie on: 11/17/2016 06:00 PM   Modules accepted: Orders

## 2016-11-17 NOTE — Patient Instructions (Addendum)
Drink 32 oz of water and have it completed by 1130 AM. Do not urinate until after the study.  I will reach out via MyChart regarding your results. It may be tomorrow.

## 2016-11-17 NOTE — Progress Notes (Signed)
Chief Complaint  Patient presents with  . Abdominal Pain    started 45 mins ago-sharp-pt stated the pain started at the bottom and is moving up the stomach    Brenda Williams is here for abdominal pain.  Duration: started this AM Palliation: Sitting down, she recently took an Excedrin which did seem to help. Provocation: Standing up straight Associated symptoms: None Denies: fever, nausea, vomiting, injury, change in activity, recent travel, medication changes, urinary complaints, vaginal discharge, and Bowel changes; she has a history of PCOS and still has her gallbladder and appendix Treatment to date: Excedrin  ROS: Constitutional: No fevers GI: No N/V/D/C, no bleeding + pain  Past Medical History:  Diagnosis Date  . Abnormal Pap smear 2006   leep  . Anxiety   . Arthritis    osteoarthritis  . Complication of anesthesia    [facility]c [facility], HAD ??  SEIZURE/ SHAKING  . GERD (gastroesophageal reflux disease)   . Headache   . Herpes    never had outbreak. pos per blood, doesn't know which type  . History of chicken pox   . Polycystic ovarian syndrome   . Shortness of breath dyspnea    W/ EXERTION   . SVD (spontaneous vaginal delivery)    x 1  . Varicose vein of leg    Family History  Problem Relation Age of Onset  . Diabetes Maternal Grandmother   . Heart disease Maternal Grandmother   . Hypertension Maternal Grandfather   . Cancer Maternal Grandfather 70    bone, colon   . Diabetes Maternal Grandfather   . Diabetes Mother   . Cancer Mother 110    breast cancer  . Hypertension Father   . Anesthesia problems Neg Hx    Past Surgical History:  Procedure Laterality Date  . DILATION AND EVACUATION N/A 09/14/2012   Procedure: DILATATION AND EVACUATION;  Surgeon: Allena Katz, MD;  Location: [facility];  Service: Gynecology;  Laterality: N/A;  . HYSTEROSCOPY  2009   uterine polyp  . IVF Retrival  2010, 2011  . KNEE SURGERY  1998   right knee  . LEEP  2006   . LUNG BIOPSY Right 12/28/2015   Procedure: RIGHT LUNG BIOPSY;  Surgeon: Ivin Poot, MD;  Location: [facility];  Service: Thoracic;  Laterality: Right;  . SPHINCTEROTOMY  2010  . uterine polyp removal    . VIDEO ASSISTED THORACOSCOPY Right 12/28/2015   Procedure: RIGHT VIDEO ASSISTED THORACOSCOPY;  Surgeon: Ivin Poot, MD;  Location: [facility];  Service: Thoracic;  Laterality: Right;    BP 122/84 (BP Location: Left Arm, Patient Position: Sitting, Cuff Size: Large)   Pulse 87   Temp 98.2 F (36.8 C) (Oral)   Ht 5\' 7"  (1.702 m)   Wt 243 lb (110.2 kg)   LMP 10/22/2016 (Approximate)   SpO2 97%   BMI 38.06 kg/m  Gen.: Awake, alert, appears stated age 36: Mucous membranes moist without mucosal lesions Heart: Regular rate and rhythm without murmurs Lungs: Clear auscultation bilaterally, no rales or wheezing, normal effort without accessory muscle use. Abdomen: Bowel sounds are present. Abdomen is soft, TTP over suprapubic region and umbilical region, mildly tender in the right lower and left lower quadrants equally on both sides, nondistended, no masses or organomegaly. Negative Murphy's, Rovsing's, McBurney's, and +Carnett's sign. Psych: Age appropriate judgment and insight. Normal mood and affect.  Abdominal pain, unspecified abdominal location - Plan: POCT Urinalysis Dipstick (Automated), US Transvaginal Non-OB, US  Pelvis Complete  UA WNL. Offered to call in an anti-spasmodic. Patient would like to continue with Excedrin. Patient was concerned about possible ovarian involvement, will order an ultrasound to rule out such pathosis. F/u pending results. Pt voiced understanding and agreement to the plan.  [place], DO 11/17/16 12:04 PM

## 2016-11-18 ENCOUNTER — Other Ambulatory Visit (HOSPITAL_BASED_OUTPATIENT_CLINIC_OR_DEPARTMENT_OTHER): Payer: 59

## 2016-11-19 ENCOUNTER — Ambulatory Visit (INDEPENDENT_AMBULATORY_CARE_PROVIDER_SITE_OTHER): Payer: 59 | Admitting: Obstetrics & Gynecology

## 2016-11-19 ENCOUNTER — Ambulatory Visit (HOSPITAL_BASED_OUTPATIENT_CLINIC_OR_DEPARTMENT_OTHER): Payer: 59

## 2016-11-19 ENCOUNTER — Encounter: Payer: Self-pay | Admitting: Obstetrics & Gynecology

## 2016-11-19 VITALS — BP 126/87 | HR 73 | Ht 67.5 in | Wt 238.0 lb

## 2016-11-19 DIAGNOSIS — R102 Pelvic and perineal pain: Secondary | ICD-10-CM | POA: Diagnosis not present

## 2016-11-19 DIAGNOSIS — Z3202 Encounter for pregnancy test, result negative: Secondary | ICD-10-CM | POA: Diagnosis not present

## 2016-11-19 DIAGNOSIS — Z01812 Encounter for preprocedural laboratory examination: Secondary | ICD-10-CM

## 2016-11-19 DIAGNOSIS — N83299 Other ovarian cyst, unspecified side: Secondary | ICD-10-CM | POA: Diagnosis not present

## 2016-11-19 LAB — POCT URINE PREGNANCY: Preg Test, Ur: NEGATIVE

## 2016-11-19 NOTE — Progress Notes (Addendum)
History:  36 y.o. [id number] here today for eval of pelvic US. LMP 10/22/2016. Pt has PCOS and has cycles every 2-3 months  Pt was at work on Monday (2 days prev) and she developed severe pain in her lower pelvis that was not responsive to OTC meds. The pain was improved with bending over. She was seen by primary care and had a US performed. She is here now for eval and to review the results.     The following portions of the patient's history were reviewed and updated as appropriate: allergies, current medications, past family history, past medical history, past social history, past surgical history and problem list.  Review of Systems:  Pertinent items are noted in HPI.   Objective:  Physical Exam Blood pressure 126/87, pulse 73, height 5' 7.5" (1.715 m), weight 238 lb (108 kg), last menstrual period 10/22/2016. CONSTITUTIONAL: Well-developed, well-nourished female in no acute distress.  HENT:  Normocephalic, atraumatic EYES: Conjunctivae and EOM are normal. No scleral icterus.  NECK: Normal range of motion SKIN: Skin is warm and dry. No rash noted. Not diaphoretic.No pallor. [place]: Alert and oriented to person, place, and time. Normal coordination.  Lungs: CTA CV: RRR Abd: Soft, nontender and nondistended Pelvic: Normal appearing external genitalia.  Normal discharge.  Small uterus, no other palpable masses, no uterine or adnexal tenderness  Labs and Imaging US Transvaginal Non-ob  Result Date: 11/17/2016 CLINICAL DATA:  Seven onset of lower abdominal pain this morning. History of polycystic ovarian syndrome. Last normal menstrual period began April EXAM: TRANSABDOMINAL AND TRANSVAGINAL ULTRASOUND OF PELVIS TECHNIQUE: Both transabdominal and transvaginal ultrasound examinations of the pelvis were performed. Transabdominal technique was performed for global imaging of the pelvis including uterus, ovaries, adnexal regions, and pelvic cul-de-sac. It was necessary to proceed with endovaginal  exam following the transabdominal exam to visualize the uterus, endometrium, ovaries, and adnexal structures. COMPARISON:  None in PACs FINDINGS: Uterus Measurements: 9.7 x 4.6 x 4.7 cm. No fibroids or other mass visualized. Endometrium Thickness: 8.4 mm.  No focal abnormality visualized. Right ovary Measurements: 5.7 x 4.7 x 5.4 cm. Within the right ovary there is a hypoechoic to anechoic structure measuring 3.7 x 3.8 x 3.5 cm. The cystic structure does not appear hypervascular. Vascularity within the right ovary is grossly normal. Left ovary Measurements: 2.3 x 2.0 x 2.6 cm. Normal appearance/no adnexal mass. Other findings There is a trace of free pelvic fluid. IMPRESSION: Enlarged right ovary containing a hypoechoic to anechoic cystic appearing structure which is not hypervascular. This may reflect a hemorrhagic cyst but it is nonspecific. Follow-up ultrasound in 8-12 weeks is recommended. Normal appearance of the uterus, endometrium, and left ovary. If the patient's symptoms now warrant further evaluation, abdominal and pelvic CT scanning would be useful after assuring that the patient's pregnancy test is negative. Electronically Signed   By: David  Martinique M.D.   On: 11/17/2016 14:24   US Pelvis Complete  Result Date: 11/17/2016 CLINICAL DATA:  Seven onset of lower abdominal pain this morning. History of polycystic ovarian syndrome. Last normal menstrual period began April EXAM: TRANSABDOMINAL AND TRANSVAGINAL ULTRASOUND OF PELVIS TECHNIQUE: Both transabdominal and transvaginal ultrasound examinations of the pelvis were performed. Transabdominal technique was performed for global imaging of the pelvis including uterus, ovaries, adnexal regions, and pelvic cul-de-sac. It was necessary to proceed with endovaginal exam following the transabdominal exam to visualize the uterus, endometrium, ovaries, and adnexal structures. COMPARISON:  None in PACs FINDINGS: Uterus Measurements: 9.7 x 4.6 x 4.7 cm.  No fibroids  or other mass visualized. Endometrium Thickness: 8.4 mm.  No focal abnormality visualized. Right ovary Measurements: 5.7 x 4.7 x 5.4 cm. Within the right ovary there is a hypoechoic to anechoic structure measuring 3.7 x 3.8 x 3.5 cm. The cystic structure does not appear hypervascular. Vascularity within the right ovary is grossly normal. Left ovary Measurements: 2.3 x 2.0 x 2.6 cm. Normal appearance/no adnexal mass. Other findings There is a trace of free pelvic fluid. IMPRESSION: Enlarged right ovary containing a hypoechoic to anechoic cystic appearing structure which is not hypervascular. This may reflect a hemorrhagic cyst but it is nonspecific. Follow-up ultrasound in 8-12 weeks is recommended. Normal appearance of the uterus, endometrium, and left ovary. If the patient's symptoms now warrant further evaluation, abdominal and pelvic CT scanning would be useful after assuring that the patient's pregnancy test is negative. Electronically Signed   By: David  Martinique M.D.   On: 11/17/2016 14:24    Assessment & Plan:  Pelvic pain- imporved functionla ov cyst  f/u US in 3 months to check for resolution  NSAIDS prn  Total face-to-face time with patient was 20 min.  Greater than 50% was spent in counseling and coordination of care with the patient.    Lenville Hibberd L. Harraway-Smith, M.D., Cherlynn June

## 2016-11-19 NOTE — Progress Notes (Signed)
Patient presents to discuss ultrasound that was ordered by [facility] primary. Kathrene Alu RNBSN

## 2016-11-24 ENCOUNTER — Other Ambulatory Visit: Payer: Self-pay

## 2016-11-24 DIAGNOSIS — Z09 Encounter for follow-up examination after completed treatment for conditions other than malignant neoplasm: Secondary | ICD-10-CM

## 2016-11-27 DIAGNOSIS — Z719 Counseling, unspecified: Secondary | ICD-10-CM | POA: Diagnosis not present

## 2016-12-03 ENCOUNTER — Encounter: Payer: Self-pay | Admitting: Physician Assistant

## 2016-12-03 DIAGNOSIS — E6609 Other obesity due to excess calories: Secondary | ICD-10-CM

## 2016-12-04 DIAGNOSIS — Z719 Counseling, unspecified: Secondary | ICD-10-CM | POA: Diagnosis not present

## 2016-12-11 DIAGNOSIS — Z719 Counseling, unspecified: Secondary | ICD-10-CM | POA: Diagnosis not present

## 2016-12-13 ENCOUNTER — Encounter (HOSPITAL_COMMUNITY): Payer: Self-pay

## 2016-12-13 ENCOUNTER — Emergency Department (HOSPITAL_COMMUNITY)
Admission: EM | Admit: 2016-12-13 | Discharge: 2016-12-14 | Disposition: A | Discharge status: Home / Self Care | Payer: 59 | Attending: Emergency Medicine | Admitting: Emergency Medicine

## 2016-12-13 DIAGNOSIS — M25561 Pain in right knee: Secondary | ICD-10-CM | POA: Insufficient documentation

## 2016-12-13 DIAGNOSIS — Z96651 Presence of right artificial knee joint: Secondary | ICD-10-CM | POA: Diagnosis not present

## 2016-12-13 DIAGNOSIS — Z79899 Other long term (current) drug therapy: Secondary | ICD-10-CM | POA: Diagnosis not present

## 2016-12-13 DIAGNOSIS — Z471 Aftercare following joint replacement surgery: Secondary | ICD-10-CM | POA: Diagnosis not present

## 2016-12-13 NOTE — ED Triage Notes (Signed)
Pt c/o R posterior knee pain. She states that she stood up earlier tonight and the pain suddenly started. Pt states that she is unable to bear weight on it. A&Ox4. Ambulatory.

## 2016-12-14 ENCOUNTER — Ambulatory Visit (HOSPITAL_BASED_OUTPATIENT_CLINIC_OR_DEPARTMENT_OTHER)
Admission: RE | Admit: 2016-12-14 | Discharge: 2016-12-14 | Disposition: A | Discharge status: Home / Self Care | Payer: 59 | Source: Ambulatory Visit | Attending: Emergency Medicine | Admitting: Emergency Medicine

## 2016-12-14 ENCOUNTER — Emergency Department (HOSPITAL_COMMUNITY): Payer: 59

## 2016-12-14 DIAGNOSIS — Z96651 Presence of right artificial knee joint: Secondary | ICD-10-CM | POA: Diagnosis not present

## 2016-12-14 DIAGNOSIS — M79609 Pain in unspecified limb: Secondary | ICD-10-CM

## 2016-12-14 DIAGNOSIS — M79604 Pain in right leg: Secondary | ICD-10-CM | POA: Insufficient documentation

## 2016-12-14 DIAGNOSIS — Z8739 Personal history of other diseases of the musculoskeletal system and connective tissue: Secondary | ICD-10-CM

## 2016-12-14 DIAGNOSIS — Z471 Aftercare following joint replacement surgery: Secondary | ICD-10-CM | POA: Diagnosis not present

## 2016-12-14 MED ORDER — OXYCODONE HCL 5 MG PO TABS
5.0000 mg | ORAL_TABLET | Freq: Once | ORAL | Status: AC
  Administered 2016-12-14: 5 mg via ORAL
  Filled 2016-12-14: qty 1

## 2016-12-14 MED ORDER — IBUPROFEN 200 MG PO TABS
600.0000 mg | ORAL_TABLET | Freq: Once | ORAL | Status: AC
  Administered 2016-12-14: 600 mg via ORAL
  Filled 2016-12-14: qty 3

## 2016-12-14 MED ORDER — ACETAMINOPHEN 325 MG PO TABS
650.0000 mg | ORAL_TABLET | Freq: Once | ORAL | Status: AC
  Administered 2016-12-14: 650 mg via ORAL
  Filled 2016-12-14: qty 2

## 2016-12-14 NOTE — Progress Notes (Signed)
VASCULAR LAB PRELIMINARY  PRELIMINARY  PRELIMINARY  PRELIMINARY  Right lower extremity venous duplex completed.    Preliminary report:  There is no DVT, SVT, or Baker's cyst noted in the right lower extremity.   Acelin Ferdig, RVT 12/14/2016, 2:46 PM

## 2016-12-14 NOTE — ED Provider Notes (Signed)
[facility] DEPT Provider Note   CSN: [id number] Arrival date & time: 12/13/16  2319     History   Chief Complaint Chief Complaint  Patient presents with  . Knee Pain    R    HPI Brenda Williams is a 36 y.o. female with pertinent past medical history of bilateral knee arthritis and right knee Baker cyst presents to the ED with sudden onset posterior and lateral right knee pain that started today when she went from sitting to standing PTA. Patient states very painful to put weight on this knee. Patient traveled to [facility] 1 month ago.  She denies recent falls, direct trauma or exertional activity preceding symptom onset. No joint erythema, warmth or swelling. No history of DVT/PE, estrogen use, h/o malignancy. No calf tenderness or lower extremity asymmetric edema. No focal numbness or weakness in lower extremities. No IV Drug use.  HPI  Past Medical History:  Diagnosis Date  . Abnormal Pap smear 2006   leep  . Anxiety   . Arthritis    osteoarthritis  . Complication of anesthesia    [facility], HAD ??  SEIZURE/ SHAKING  . GERD (gastroesophageal reflux disease)   . Headache   . Herpes    never had outbreak. pos per blood, doesn't know which type  . History of chicken pox   . Polycystic ovarian syndrome   . Shortness of breath dyspnea    W/ EXERTION   . SVD (spontaneous vaginal delivery)    x 1  . Varicose vein of leg     Patient Active Problem List   Diagnosis Date Noted  . Streptococcal sore throat 03/12/2016  . 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 Breast cancer screening 09/17/2015  . Bilateral knee pain 08/20/2015  . Obesity 01/12/2015  . Chronic fatigue 12/01/2014  . Abnormal CT scan, chest 06/28/2013  . Solitary pulmonary nodule 06/28/2013  . Other ectopic pregnancy without intrauterine pregnancy 05/12/2011  . Cervix abnormality 05/12/2011  . Polycystic ovaries 05/12/2011  . Female infertility of unspecified origin 05/12/2011  .  Nausea alone 05/12/2011    Past Surgical History:  Procedure Laterality Date  . DILATION AND EVACUATION N/A 09/14/2012   Procedure: DILATATION AND EVACUATION;  Surgeon: Allena Katz, MD;  Location: [facility];  Service: Gynecology;  Laterality: N/A;  . HYSTEROSCOPY  2009   uterine polyp  . IVF Retrival  2010, 2011  . KNEE SURGERY  1998   right knee  . LEEP  2006  . LUNG BIOPSY Right 12/28/2015   Procedure: RIGHT LUNG BIOPSY;  Surgeon: Ivin Poot, MD;  Location: [facility];  Service: Thoracic;  Laterality: Right;  . SPHINCTEROTOMY  2010  . uterine polyp removal    . VIDEO ASSISTED THORACOSCOPY Right 12/28/2015   Procedure: RIGHT VIDEO ASSISTED THORACOSCOPY;  Surgeon: Ivin Poot, MD;  Location: [facility];  Service: Thoracic;  Laterality: Right;    OB History    Gravida Para Term Preterm AB Living   5 1 1  0 2 1   SAB TAB Ectopic Multiple Live Births   1 0 1 0 1       Home Medications    Prior to Admission medications   Medication Sig Start Date End Date Taking? Authorizing Provider  sirolimus (RAPAMUNE) 1 MG tablet Take 1 tablet (1 mg total) by mouth daily. Patient not taking: Reported on 11/17/2016 10/09/16   Collene Gobble, MD    Family History Family History  Problem  Relation Age of Onset  . Diabetes Maternal Grandmother   . Heart disease Maternal Grandmother   . Hypertension Maternal Grandfather   . Cancer Maternal Grandfather 70       bone, colon   . Diabetes Maternal Grandfather   . Diabetes Mother   . Cancer Mother 69       breast cancer  . Hypertension Father   . Anesthesia problems Neg Hx     Social History Social History  Substance Use Topics  . Smoking status: Never Smoker  . Smokeless tobacco: Never Used  . Alcohol use 0.0 oz/week     Comment: OCC     Allergies   Fentanyl   Review of Systems Review of Systems  Constitutional: Negative for fever.  Respiratory: Negative for cough and shortness of breath.   Cardiovascular: Negative for chest  pain.  Musculoskeletal: Positive for arthralgias and gait problem. Negative for joint swelling and myalgias.  Skin: Negative for color change, pallor and rash.     Physical Exam Updated Vital Signs BP (!) 158/92 (BP Location: Right Arm)   Pulse 81   Temp 98.1 F (36.7 C) (Oral)   Resp 18   Ht 5' 7.5" (1.715 m)   Wt 107 kg (236 lb)   LMP 11/23/2016   SpO2 100%   BMI 36.42 kg/m   Physical Exam  Constitutional: She is oriented to person, place, and time. She appears well-developed and well-nourished. No distress.  NAD.  HENT:  Head: Normocephalic and atraumatic.  Right Ear: External ear normal.  Left Ear: External ear normal.  Nose: Nose normal.  Eyes: Conjunctivae and EOM are normal. No scleral icterus.  Neck: Normal range of motion. Neck supple.  Cardiovascular: Normal rate, regular rhythm and normal heart sounds.   No murmur heard. 2+ popliteal, DP and PT pulses bilaterally Good capillary refill in lower extremities No asymmetric lower extremity edema Calves are supple and nontender  Pulmonary/Chest: Effort normal and breath sounds normal. She has no wheezes.  Musculoskeletal: Normal range of motion. She exhibits tenderness. She exhibits no deformity.       Right knee: She exhibits no swelling, no effusion, no ecchymosis, no deformity, no laceration, no erythema, no LCL laxity, normal patellar mobility, no bony tenderness and no MCL laxity. Tenderness (popliteal space tenderness) found. Lateral joint line and MCL tenderness noted. No medial joint line, no LCL and no patellar tendon tenderness noted.  +Patient has full passive ROMI of bilateral knees however she reports exacerbation of right knee pain with maximal flexion and extension. + Tenderness to popliteal space and lateral meniscus/LCL No obvious right knee edema, erythema, warmth, fluctuance.  Normal patellar J tracking bilaterally.  No bony tenderness over patella, fibular head or tibial tuberosity, patellar tendon  or quadriceps tendon.    Negative Lachman's. Negative posterior drawer test. No varus or valgus laxity.  No crepitus with knee ROM.   Neurological: She is alert and oriented to person, place, and time.  5/5 strength with knee flexion and extension, bilaterally.  5/5 strength with ankle dorsiflexion and plantar flexion, bilaterally.  Feet: sensation to light touch intact in the distribution of the saphenous nerve, medial plantar nerve, lateral plantar nerve, bilaterally.   Skin: Skin is warm and dry. Capillary refill takes less than 2 seconds. No rash noted. No erythema.  Psychiatric: She has a normal mood and affect. Her behavior is normal. Judgment and thought content normal.  Nursing note and vitals reviewed.    ED Treatments / Results  Labs (all labs ordered are listed, but only abnormal results are displayed) Labs Reviewed - No data to display  EKG  EKG Interpretation None       Radiology Dg Knee Complete 4 Views Right  Result Date: 12/14/2016 CLINICAL DATA:  Posterior knee pain EXAM: RIGHT KNEE - COMPLETE 4+ VIEW COMPARISON:  04/21/2015 MRI , 04/13/2015 radiographs FINDINGS: No fracture or malalignment. Moderate degenerative changes of the patella femoral compartment with prominent bony spurring. Mild degenerative changes of the medial and lateral joint space compartments. No large effusion IMPRESSION: Mild to moderate degenerative changes of the right knee. No acute osseous abnormality. Electronically Signed   By: Donavan Foil M.D.   On: 12/14/2016 00:34    Procedures Procedures (including critical care time)  Medications Ordered in ED Medications  oxyCODONE (Oxy IR/ROXICODONE) immediate release tablet 5 mg (5 mg Oral Given 12/14/16 0039)  ibuprofen (ADVIL,MOTRIN) tablet 600 mg (600 mg Oral Given 12/14/16 0039)  acetaminophen (TYLENOL) tablet 650 mg (650 mg Oral Given 12/14/16 0039)     Initial Impression / Assessment and Plan / ED Course  I have reviewed the triage vital  signs and the nursing notes.  Pertinent labs & imaging results that were available during my care of the patient were reviewed by me and considered in my medical decision making (see chart for details).  Clinical Course as of Dec 15 102  Sun Dec 14, 2016  0036 IMPRESSION: Mild to moderate degenerative changes of the right knee. No acute osseous abnormality. DG Knee Complete 4 Views Right [CG]    Clinical Course User Index [CG] Kinnie Feil, PA-C   36 year old female with known history of arthritis to bilateral knees and intermittent arthritic type knee pain presents the ED with sudden onset 8/10 pain to posterior and lateral right knee that started when she went from sitting to standing today. No recent trauma. No previous history of DVT/PE. Patient traveled to [facility] 1 month ago. No lower extremity asymmetric edema, calf tenderness, pallor. RLE has good distal pulses and is warm. Patient had right knee MRI last year which showed tiny Baker cyst and degenerative changes. Right knee x-ray today negative. Likely soft tissue injury, possibly meniscal injury. Low suspicion for popliteal DVT or worsening Baker's cyst however I would like patient to go to [facility] tomorrow for an outpatient lower extremity ultrasound. Discussed x-ray findings with patient and discharge plan. She is agreeable and will go to [facility] tomorrow for ultrasound tomorrow. Will discharge with high-dose NSAIDs, ice and follow-up with her orthopedist within 7 days for further reevaluation. ED return precautions given. Patient and husband at bedside are agreeable with plan.  Ultrasound order sent.  Final Clinical Impressions(s) / ED Diagnoses   Final diagnoses:  Acute pain of right knee    New Prescriptions New Prescriptions   No medications on file     Arlean Hopping 12/14/16 0104    Tanna Furry, MD 12/26/16 [phone]

## 2016-12-14 NOTE — Discharge Instructions (Signed)
Your knee x-ray did not show any bony injuries including dislocation or fracture.  Given your MRI finding of a small Baker cyst to the right knee last year, I am concerned that your cyst may have gotten bigger. I'm also concerned that you may have a deep venous thrombosis (blood clots in her vein) in the back of your knee.  I have put in an order for an outpatient lower extremity ultrasound. Please go to [facility] radiology department tomorrow and get this ultrasound done.  Please contact Dr. Barbaraann Barthel within 7-10 days for a re-evaluation, he may suggest a repeat MRI and / or recommend other treatment for your knee pain.    Contact your primary care provider within the next 2-3 days to make an appointment and follow-up on your ultrasound results.  For pain please take 600 mg of ibuprofen +1000 mg of Tylenol every 8 hours. Please please ice on your knee and elevate to decrease inflammation.

## 2016-12-15 DIAGNOSIS — Z719 Counseling, unspecified: Secondary | ICD-10-CM | POA: Diagnosis not present

## 2016-12-16 ENCOUNTER — Ambulatory Visit (INDEPENDENT_AMBULATORY_CARE_PROVIDER_SITE_OTHER): Payer: 59 | Admitting: Family Medicine

## 2016-12-16 ENCOUNTER — Encounter: Payer: Self-pay | Admitting: Family Medicine

## 2016-12-16 DIAGNOSIS — M25561 Pain in right knee: Secondary | ICD-10-CM | POA: Diagnosis not present

## 2016-12-16 NOTE — Patient Instructions (Signed)
I'm concerned you tore your lateral meniscus. Wear the knee brace or a sleeve for support. Ice the knee 15 minutes at a time 3-4 times a day. Ibuprofen 600mg  three times a day with food OR aleve 2 tabs twice a day with food for pain and inflammation. Start physical therapy and do home exercises on days you don't go to therapy. Test this knee out with driving only around your neighborhood - if you can comfortably hit the brake you are ok to drive - if not, you should not drive until you can. Consider injection, MRI if you're not improving as expected over the next 4-6 weeks. Follow up with me in 6 weeks but can return sooner if you're struggling.

## 2016-12-17 NOTE — Progress Notes (Signed)
PCP: Brunetta Jeans, PA-C  Subjective:   HPI: Patient is a 36 y.o. female here for right knee pain.  2/2: Patient reports having over 1 week of bilateral knee pain, left worse than right. Pain is 0/10 at rest, up to 7/10 with a lot of walking, weight bearing. Pain anterior, dull. Using heat.  Swelling last night. Pain does move around though. Had an MRI of right knee 4 months ago - showed only DJD, chondromalacia. No skin changes, fever, other complaints.  09/07/15: Patient reports she still has up to 5/10 pain, dull anteriorly. Not noticed much difference with injection. Tried ibuprofen, tylenol. Worse with ambulation. No skin changes, fever.  12/16/16: Patient reports on 6/2 she was sitting down - went to get up and felt a sharp pain anterior and lateral aspect of right knee. Couldn't put pressure on this leg. + swelling. Has been using a crutch, icing, taking ibuprofen and tylenol. Also wearing a knee brace. Had radiographs and ultrasound done that were negative. Knee feels like it's giving out. Pain level 6/10, sharp No skin changes, numbness.  Past Medical History:  Diagnosis Date  . Abnormal Pap smear 2006   leep  . Anxiety   . Arthritis    osteoarthritis  . Complication of anesthesia    [facility], HAD ??  SEIZURE/ SHAKING  . GERD (gastroesophageal reflux disease)   . Headache   . Herpes    never had outbreak. pos per blood, doesn't know which type  . History of chicken pox   . Polycystic ovarian syndrome   . Shortness of breath dyspnea    W/ EXERTION   . SVD (spontaneous vaginal delivery)    x 1  . Varicose vein of leg     Current Outpatient Prescriptions on File Prior to Visit  Medication Sig Dispense Refill  . sirolimus (RAPAMUNE) 1 MG tablet Take 1 tablet (1 mg total) by mouth daily. (Patient not taking: Reported on 11/17/2016) 30 tablet 0   No current facility-administered medications on file prior to visit.     Past Surgical  History:  Procedure Laterality Date  . DILATION AND EVACUATION N/A 09/14/2012   Procedure: DILATATION AND EVACUATION;  Surgeon: Allena Katz, MD;  Location: [facility];  Service: Gynecology;  Laterality: N/A;  . HYSTEROSCOPY  2009   uterine polyp  . IVF Retrival  2010, 2011  . KNEE SURGERY  1998   right knee  . LEEP  2006  . LUNG BIOPSY Right 12/28/2015   Procedure: RIGHT LUNG BIOPSY;  Surgeon: Ivin Poot, MD;  Location: [facility] [facility];  Service: Thoracic;  Laterality: Right;  . SPHINCTEROTOMY  2010  . uterine polyp removal    . VIDEO ASSISTED THORACOSCOPY Right 12/28/2015   Procedure: RIGHT VIDEO ASSISTED THORACOSCOPY;  Surgeon: Ivin Poot, MD;  Location: [facility];  Service: Thoracic;  Laterality: Right;    Allergies  Allergen Reactions  . Fentanyl Itching    Benadryl effective for itching.    Social History   Social History  . Marital status: Married    Spouse name: N/A  . Number of children: N/A  . Years of education: N/A   Occupational History  . Not on file.   Social History Main Topics  . Smoking status: Never Smoker  . Smokeless tobacco: Never Used  . Alcohol use 0.0 oz/week     Comment: OCC  . Drug use: No  . Sexual activity: Yes    Birth control/  protection: None   Other Topics Concern  . Not on file   Social History Narrative  . No narrative on file    Family History  Problem Relation Age of Onset  . Diabetes Maternal Grandmother   . Heart disease Maternal Grandmother   . Hypertension Maternal Grandfather   . Cancer Maternal Grandfather 70       bone, colon   . Diabetes Maternal Grandfather   . Diabetes Mother   . Cancer Mother 32       breast cancer  . Hypertension Father   . Anesthesia problems Neg Hx     BP 118/83   Pulse 80   Ht 5\' 8"  (1.727 m)   Wt 238 lb (108 kg)   LMP 11/23/2016   BMI 36.19 kg/m   Review of Systems: See HPI above.    Objective:  Physical Exam:  Gen: NAD  Right knee: Mild effusion.  No other gross  deformity, ecchymoses. TTP lateral joint line.  No other tenderness. FROM. Negative ant/post drawers. Negative valgus/varus testing. Negative lachmanns. Positive mcmurrays, apleys, negative patellar apprehension. NV intact distally.  Left knee: FROM without pain.  Assessment & Plan:  1. Right knee pain - history of DJD, chondromalacia.  Current issue concerning for lateral meniscus tear however.  We discussed options - continue knee brace.  Icing, aleve or ibuprofen.  Start physical therapy, home exercises.  Consider injection, MRI.  F/u in 6 weeks.

## 2016-12-17 NOTE — Assessment & Plan Note (Signed)
history of DJD, chondromalacia.  Current issue concerning for lateral meniscus tear however.  We discussed options - continue knee brace.  Icing, aleve or ibuprofen.  Start physical therapy, home exercises.  Consider injection, MRI.  F/u in 6 weeks.

## 2016-12-23 DIAGNOSIS — M25661 Stiffness of right knee, not elsewhere classified: Secondary | ICD-10-CM | POA: Diagnosis not present

## 2016-12-23 DIAGNOSIS — R262 Difficulty in walking, not elsewhere classified: Secondary | ICD-10-CM | POA: Diagnosis not present

## 2016-12-23 DIAGNOSIS — M25561 Pain in right knee: Secondary | ICD-10-CM | POA: Diagnosis not present

## 2017-01-01 DIAGNOSIS — Z719 Counseling, unspecified: Secondary | ICD-10-CM | POA: Diagnosis not present

## 2017-01-06 DIAGNOSIS — M25661 Stiffness of right knee, not elsewhere classified: Secondary | ICD-10-CM | POA: Diagnosis not present

## 2017-01-06 DIAGNOSIS — M25561 Pain in right knee: Secondary | ICD-10-CM | POA: Diagnosis not present

## 2017-01-06 DIAGNOSIS — R262 Difficulty in walking, not elsewhere classified: Secondary | ICD-10-CM | POA: Diagnosis not present

## 2017-01-08 DIAGNOSIS — Z719 Counseling, unspecified: Secondary | ICD-10-CM | POA: Diagnosis not present

## 2017-01-17 ENCOUNTER — Ambulatory Visit (HOSPITAL_BASED_OUTPATIENT_CLINIC_OR_DEPARTMENT_OTHER): Payer: 59

## 2017-01-20 DIAGNOSIS — M25561 Pain in right knee: Secondary | ICD-10-CM | POA: Diagnosis not present

## 2017-01-20 DIAGNOSIS — M25661 Stiffness of right knee, not elsewhere classified: Secondary | ICD-10-CM | POA: Diagnosis not present

## 2017-01-20 DIAGNOSIS — R262 Difficulty in walking, not elsewhere classified: Secondary | ICD-10-CM | POA: Diagnosis not present

## 2017-01-21 ENCOUNTER — Encounter: Payer: Self-pay | Admitting: Family Medicine

## 2017-01-21 ENCOUNTER — Ambulatory Visit (INDEPENDENT_AMBULATORY_CARE_PROVIDER_SITE_OTHER): Payer: 59 | Admitting: Family Medicine

## 2017-01-21 DIAGNOSIS — M25561 Pain in right knee: Secondary | ICD-10-CM

## 2017-01-21 NOTE — Assessment & Plan Note (Signed)
2/2 lateral meniscus tear.  Clinically significantly improved with physical therapy, home exercises.  Icing, ibuprofen only if needed.  Finish out physical therapy.  Do home exercises 2-3 times a week for 6 more weeks.  F/u prn.

## 2017-01-21 NOTE — Progress Notes (Signed)
PCP: Brunetta Jeans, PA-C  Subjective:   HPI: Patient is a 36 y.o. female here for right knee pain.  2/2: Patient reports having over 1 week of bilateral knee pain, left worse than right. Pain is 0/10 at rest, up to 7/10 with a lot of walking, weight bearing. Pain anterior, dull. Using heat.  Swelling last night. Pain does move around though. Had an MRI of right knee 4 months ago - showed only DJD, chondromalacia. No skin changes, fever, other complaints.  09/07/15: Patient reports she still has up to 5/10 pain, dull anteriorly. Not noticed much difference with injection. Tried ibuprofen, tylenol. Worse with ambulation. No skin changes, fever.  12/16/16: Patient reports on 6/2 she was sitting down - went to get up and felt a sharp pain anterior and lateral aspect of right knee. Couldn't put pressure on this leg. + swelling. Has been using a crutch, icing, taking ibuprofen and tylenol. Also wearing a knee brace. Had radiographs and ultrasound done that were negative. Knee feels like it's giving out. Pain level 6/10, sharp No skin changes, numbness.  7/11: Patient reports she is doing extremely well. Doing physical therapy, home exercises. Pain is 0/10. Gets some swelling at times but none currently. No other complaints. No skin changes.  Past Medical History:  Diagnosis Date  . Abnormal Pap smear 2006   leep  . Anxiety   . Arthritis    osteoarthritis  . Complication of anesthesia    [facility], HAD ??  SEIZURE/ SHAKING  . GERD (gastroesophageal reflux disease)   . Headache   . Herpes    never had outbreak. pos per blood, doesn't know which type  . History of chicken pox   . Polycystic ovarian syndrome   . Shortness of breath dyspnea    W/ EXERTION   . SVD (spontaneous vaginal delivery)    x 1  . Varicose vein of leg     Current Outpatient Prescriptions on File Prior to Visit  Medication Sig Dispense Refill  . sirolimus (RAPAMUNE) 1 MG  tablet Take 1 tablet (1 mg total) by mouth daily. (Patient not taking: Reported on 11/17/2016) 30 tablet 0   No current facility-administered medications on file prior to visit.     Past Surgical History:  Procedure Laterality Date  . DILATION AND EVACUATION N/A 09/14/2012   Procedure: DILATATION AND EVACUATION;  Surgeon: Allena Katz, MD;  Location: [facility];  Service: Gynecology;  Laterality: N/A;  . HYSTEROSCOPY  2009   uterine polyp  . IVF Retrival  2010, 2011  . KNEE SURGERY  1998   right knee  . LEEP  2006  . LUNG BIOPSY Right 12/28/2015   Procedure: RIGHT LUNG BIOPSY;  Surgeon: Ivin Poot, MD;  Location: [place];  Service: Thoracic;  Laterality: Right;  . SPHINCTEROTOMY  2010  . uterine polyp removal    . VIDEO ASSISTED THORACOSCOPY Right 12/28/2015   Procedure: RIGHT VIDEO ASSISTED THORACOSCOPY;  Surgeon: Ivin Poot, MD;  Location: [facility];  Service: Thoracic;  Laterality: Right;    Allergies  Allergen Reactions  . Fentanyl Itching    Benadryl effective for itching.    Social History   Social History  . Marital status: Married    Spouse name: N/A  . Number of children: N/A  . Years of education: N/A   Occupational History  . Not on file.   Social History Main Topics  . Smoking status: Never Smoker  .  Smokeless tobacco: Never Used  . Alcohol use 0.0 oz/week     Comment: OCC  . Drug use: No  . Sexual activity: Yes    Birth control/ protection: None   Other Topics Concern  . Not on file   Social History Narrative  . No narrative on file    Family History  Problem Relation Age of Onset  . Diabetes Maternal Grandmother   . Heart disease Maternal Grandmother   . Hypertension Maternal Grandfather   . Cancer Maternal Grandfather 70       bone, colon   . Diabetes Maternal Grandfather   . Diabetes Mother   . Cancer Mother 48       breast cancer  . Hypertension Father   . Anesthesia problems Neg Hx     BP 124/89   Pulse 78   Ht 5\' 8"  (1.727 m)    Wt 238 lb (108 kg)   BMI 36.19 kg/m   Review of Systems: See HPI above.    Objective:  Physical Exam:  Gen: NAD  Right knee: No effusion.  No other gross deformity, ecchymoses. No TTP lateral joint line.  No other tenderness. FROM. Negative ant/post drawers. Negative valgus/varus testing. Negative lachmanns. Negative mcmurrays, apleys, negative patellar apprehension. NV intact distally.  Left knee: FROM without pain.  Assessment & Plan:  1. Right knee pain - 2/2 lateral meniscus tear.  Clinically significantly improved with physical therapy, home exercises.  Icing, ibuprofen only if needed.  Finish out physical therapy.  Do home exercises 2-3 times a week for 6 more weeks.  F/u prn.

## 2017-01-21 NOTE — Patient Instructions (Signed)
Finish out the physical therapy. Then I would do home exercises 2-3 times a week for 6 more weeks. Follow up with me as needed.

## 2017-01-22 ENCOUNTER — Encounter (INDEPENDENT_AMBULATORY_CARE_PROVIDER_SITE_OTHER): Payer: Self-pay | Admitting: Family Medicine

## 2017-01-22 DIAGNOSIS — Z719 Counseling, unspecified: Secondary | ICD-10-CM | POA: Diagnosis not present

## 2017-01-24 ENCOUNTER — Ambulatory Visit (HOSPITAL_BASED_OUTPATIENT_CLINIC_OR_DEPARTMENT_OTHER)
Admission: RE | Admit: 2017-01-24 | Discharge: 2017-01-24 | Disposition: A | Discharge status: Home / Self Care | Payer: 59 | Source: Ambulatory Visit | Attending: Obstetrics & Gynecology | Admitting: Obstetrics & Gynecology

## 2017-01-24 DIAGNOSIS — Z09 Encounter for follow-up examination after completed treatment for conditions other than malignant neoplasm: Secondary | ICD-10-CM | POA: Insufficient documentation

## 2017-01-24 DIAGNOSIS — N83299 Other ovarian cyst, unspecified side: Secondary | ICD-10-CM

## 2017-01-24 DIAGNOSIS — Z8742 Personal history of other diseases of the female genital tract: Secondary | ICD-10-CM | POA: Insufficient documentation

## 2017-01-27 DIAGNOSIS — M25561 Pain in right knee: Secondary | ICD-10-CM | POA: Diagnosis not present

## 2017-01-27 DIAGNOSIS — M25661 Stiffness of right knee, not elsewhere classified: Secondary | ICD-10-CM | POA: Diagnosis not present

## 2017-01-27 DIAGNOSIS — R262 Difficulty in walking, not elsewhere classified: Secondary | ICD-10-CM | POA: Diagnosis not present

## 2017-01-28 ENCOUNTER — Telehealth: Payer: Self-pay | Admitting: Emergency Medicine

## 2017-01-28 ENCOUNTER — Encounter: Payer: Self-pay | Admitting: Emergency Medicine

## 2017-01-28 ENCOUNTER — Encounter (INDEPENDENT_AMBULATORY_CARE_PROVIDER_SITE_OTHER): Payer: Self-pay | Admitting: Family Medicine

## 2017-01-28 ENCOUNTER — Ambulatory Visit (INDEPENDENT_AMBULATORY_CARE_PROVIDER_SITE_OTHER): Payer: 59 | Admitting: Family Medicine

## 2017-01-28 VITALS — BP 116/80 | HR 72 | Temp 97.9°F | Ht 67.0 in | Wt 236.0 lb

## 2017-01-28 DIAGNOSIS — R7303 Prediabetes: Secondary | ICD-10-CM

## 2017-01-28 DIAGNOSIS — E669 Obesity, unspecified: Secondary | ICD-10-CM

## 2017-01-28 DIAGNOSIS — Z1331 Encounter for screening for depression: Secondary | ICD-10-CM

## 2017-01-28 DIAGNOSIS — IMO0001 Reserved for inherently not codable concepts without codable children: Secondary | ICD-10-CM

## 2017-01-28 DIAGNOSIS — Z6837 Body mass index (BMI) 37.0-37.9, adult: Secondary | ICD-10-CM

## 2017-01-28 DIAGNOSIS — R0609 Other forms of dyspnea: Secondary | ICD-10-CM | POA: Diagnosis not present

## 2017-01-28 DIAGNOSIS — Z0289 Encounter for other administrative examinations: Secondary | ICD-10-CM

## 2017-01-28 DIAGNOSIS — Z1389 Encounter for screening for other disorder: Secondary | ICD-10-CM

## 2017-01-28 DIAGNOSIS — R5383 Other fatigue: Secondary | ICD-10-CM

## 2017-01-28 NOTE — Telephone Encounter (Signed)
Pt returning call and can be reached @ [phone].Brenda Williams

## 2017-01-28 NOTE — Telephone Encounter (Signed)
Spoke with pt, scheduled rov with BO on 02/18/17.  Pt is requesting RB to call in a 30 day supply of Sirolimus (copay is the same whether she gets 10 or 30 tabs), so she can start rx and discuss it at her OV on 02/18/17 with Brandi.   Pt uses [facility] on [address].  RB ok to send rx?  Thanks!

## 2017-01-28 NOTE — Telephone Encounter (Signed)
lmtcb X1 for pt  

## 2017-01-28 NOTE — Progress Notes (Signed)
Office: [phone]  /  Fax: [phone]   Dear Brunetta Jeans, PA-C,   Thank you for referring Brenda Williams to our clinic. The following note includes my evaluation and treatment recommendations.  HPI:   Chief Complaint: OBESITY  Brenda Williams has been referred by Brunetta Jeans, PA-C for consultation regarding her obesity and obesity related comorbidities.  Brenda Williams (MR# [id number]) is a 36 y.o. female who presents on 01/28/2017 for obesity evaluation and treatment. Current BMI is Body mass index is 36.96 kg/m.Marland Kitchen Brenda Williams has struggled with obesity for years and has been unsuccessful in either losing weight or maintaining long term weight loss. Brenda Williams attended our information session and states she is currently in the action stage of change and ready to dedicate time achieving and maintaining a healthier weight.  Brenda Williams states her family eats meals together she thinks her family will eat healthier with  her she struggles with family and or coworkers weight loss sabotage her desired weight loss is 70 lbs she has been heavy most of  her life she started gaining weight at 36 yrs old her heaviest weight ever was 242 lbs. she is a picky eater and doesn't like to eat healthier foods  she has significant food cravings issues  she skips meals frequently she is frequently drinking liquids with calories she frequently makes poor food choices she has problems with excessive hunger  she frequently eats larger portions than normal  she has binge eating behaviors she struggles with emotional eating    Fatigue Brenda Williams feels her energy is lower than it should be. This has worsened with weight gain and has not worsened recently. Brenda Williams admits to daytime somnolence and  admits to waking up still tired. Patient is at risk for obstructive sleep apnea. Patent has a history of symptoms of daytime fatigue, morning fatigue and morning headache. Patient generally gets 6 or 7 hours  of sleep per night, and states they generally have restless sleep. Snoring is not present. Apneic episodes are present. Epworth Sleepiness Score is 7  Dyspnea on exertion Brenda Williams notes increasing shortness of breath with exercising and seems to be worsening over time with weight gain. She notes getting out of breath sooner with activity than she used to. This has not gotten worse recently. Brenda Williams denies orthopnea.  Pre-Diabetes Brenda Williams was diagnosed with pre-diabetes previously a few years ago and has a history of PCOS. She was informed this puts her at greater risk of developing diabetes. She is not taking metformin currently and is attempting to work on diet and exercise to decrease risk of diabetes. She admits polyphagia and denies nausea or hypoglycemia.   Depression Screen Brenda Williams Food and Mood (modified PHQ-9) score was  Depression screen PHQ 2/9 01/28/2017  Decreased Interest 3  Down, Depressed, Hopeless 3  PHQ - 2 Score 6  Altered sleeping 1  Change in appetite 3  Feeling bad or failure about yourself  2  Trouble concentrating 2  Moving slowly or fidgety/restless 2  Suicidal thoughts 1  PHQ-9 Score 17    ALLERGIES: Allergies  Allergen Reactions  . Fentanyl Itching    Benadryl effective for itching.    MEDICATIONS: Current Outpatient Prescriptions on File Prior to Visit  Medication Sig Dispense Refill  . sirolimus (RAPAMUNE) 1 MG tablet Take 1 tablet (1 mg total) by mouth daily. (Patient not taking: Reported on 11/17/2016) 30 tablet 0   No current facility-administered medications on file prior to visit.  PAST MEDICAL HISTORY: Past Medical History:  Diagnosis Date  . Abnormal Pap smear 2006   leep  . Anxiety   . Arthritis    osteoarthritis  . Back pain   . Complication of anesthesia    2014 d+c [facility], HAD ??  SEIZURE/ SHAKING  . Constipation   . Depression   . Dyspnea   . GERD (gastroesophageal reflux disease)   . Headache   . Herpes    never  had outbreak. pos per blood, doesn't know which type  . History of chicken pox   . Joint pain   . Lactose intolerance   . Lymphangiomatosis   . Polycystic ovarian syndrome   . Prediabetes   . Shortness of breath dyspnea    W/ EXERTION   . SVD (spontaneous vaginal delivery)    x 1  . Varicose vein of leg   . Vitamin D deficiency     PAST SURGICAL HISTORY: Past Surgical History:  Procedure Laterality Date  . DILATION AND EVACUATION N/A 09/14/2012   Procedure: DILATATION AND EVACUATION;  Surgeon: Allena Katz, MD;  Location: [facility];  Service: Gynecology;  Laterality: N/A;  . HYSTEROSCOPY  2009   uterine polyp  . IVF Retrival  2010, 2011  . KNEE SURGERY  1998   right knee  . LEEP  2006  . LUNG BIOPSY Right 12/28/2015   Procedure: RIGHT LUNG BIOPSY;  Surgeon: Ivin Poot, MD;  Location: [facility];  Service: Thoracic;  Laterality: Right;  . SPHINCTEROTOMY  2010  . uterine polyp removal    . VIDEO ASSISTED THORACOSCOPY Right 12/28/2015   Procedure: RIGHT VIDEO ASSISTED THORACOSCOPY;  Surgeon: Ivin Poot, MD;  Location: [facility];  Service: Thoracic;  Laterality: Right;    SOCIAL HISTORY: Social History  Substance Use Topics  . Smoking status: Never Smoker  . Smokeless tobacco: Never Used  . Alcohol use 0.0 oz/week     Comment: OCC    FAMILY HISTORY: Family History  Problem Relation Age of Onset  . Diabetes Maternal Grandmother   . Heart disease Maternal Grandmother   . Hypertension Maternal Grandfather   . Cancer Maternal Grandfather 70       bone, colon   . Diabetes Maternal Grandfather   . Diabetes Mother   . Cancer Mother 41       breast cancer  . Hyperlipidemia Mother   . Depression Mother   . Obesity Mother   . Hypertension Father   . Hyperlipidemia Father   . Thyroid disease Father   . Alcohol abuse Father   . Drug abuse Father   . Obesity Father   . Anesthesia problems Neg Hx     ROS: Review of Systems  Constitutional: Positive for  malaise/fatigue.  HENT: Positive for hearing loss.        Hoarseness  Eyes:       Vision Changes  Respiratory: Positive for shortness of breath (on exertion).   Cardiovascular: Negative for orthopnea.       Difficulty Breathing while Lying Down  Gastrointestinal: Positive for constipation. Negative for nausea.  Musculoskeletal: Positive for back pain.       Muscle or Joint Pain Red or Swollen Joints  Neurological: Positive for dizziness and headaches.  Endo/Heme/Allergies:       Polyphagia Negative hypoglycemia  Psychiatric/Behavioral: Positive for depression.    PHYSICAL EXAM: Blood pressure 116/80, pulse 72, temperature 97.9 F (36.6 C), temperature source Oral, height 5\' 7"  (1.702 m),  weight 236 lb (107 kg), last menstrual period 01/13/2017, SpO2 97 %. Body mass index is 36.96 kg/m. Physical Exam  Constitutional: She is oriented to person, place, and time. She appears well-developed and well-nourished.  Cardiovascular: Normal rate.   Pulmonary/Chest: Effort normal.  Musculoskeletal: Normal range of motion.  Neurological: She is oriented to person, place, and time.  Skin: Skin is warm and dry.  Psychiatric: She has a normal mood and affect. Her behavior is normal.  Vitals reviewed.   RECENT LABS AND TESTS: BMET    Component Value Date/Time   NA 138 12/30/2015 0320   K 3.7 12/30/2015 0320   CL 106 12/30/2015 0320   CO2 27 12/30/2015 0320   GLUCOSE 100 (H) 12/30/2015 0320   BUN 6 12/30/2015 0320   CREATININE 0.66 12/30/2015 0320   CALCIUM 8.7 (L) 12/30/2015 0320   GFRNONAA >60 12/30/2015 0320   GFRAA >60 12/30/2015 0320   Lab Results  Component Value Date   HGBA1C 6.0 09/18/2015   No results found for: INSULIN CBC    Component Value Date/Time   WBC 9.9 03/14/2016 1041   RBC 4.79 03/14/2016 1041   HGB 14.1 03/14/2016 1041   HCT 42.4 03/14/2016 1041   PLT 272.0 03/14/2016 1041   MCV 88.6 03/14/2016 1041   MCH 28.1 12/30/2015 0320   MCHC 33.3 03/14/2016  1041   RDW 15.4 03/14/2016 1041   LYMPHSABS 1.9 03/14/2016 1041   MONOABS 0.7 03/14/2016 1041   EOSABS 0.2 03/14/2016 1041   BASOSABS 0.0 03/14/2016 1041   Iron/TIBC/Ferritin/ %Sat No results found for: IRON, TIBC, FERRITIN, IRONPCTSAT Lipid Panel     Component Value Date/Time   CHOL 223 (H) 09/18/2015 0743   TRIG 63.0 09/18/2015 0743   HDL 57.00 09/18/2015 0743   CHOLHDL 4 09/18/2015 0743   VLDL 12.6 09/18/2015 0743   LDLCALC 154 (H) 09/18/2015 0743   LDLDIRECT 153.4 12/10/2012 1518   Hepatic Function Panel     Component Value Date/Time   PROT 6.3 (L) 12/30/2015 0320   ALBUMIN 3.0 (L) 12/30/2015 0320   AST 17 12/30/2015 0320   ALT 17 12/30/2015 0320   ALKPHOS 44 12/30/2015 0320   BILITOT 0.5 12/30/2015 0320      Component Value Date/Time   TSH 0.92 09/18/2015 0743   TSH 0.84 12/01/2014 0807   TSH 0.53 12/10/2012 1518    ECG  shows NSR with a rate of 72 BPM INDIRECT CALORIMETER done today shows a VO2 of 173 and a REE of 1208.    ASSESSMENT AND PLAN: Other fatigue - Plan: EKG 12-Lead, Comprehensive metabolic panel, CBC With Differential, Lipid Panel With LDL/HDL Ratio, VITAMIN D 25 Hydroxy (Vit-D Deficiency, Fractures), Vitamin B12, Folate, TSH, T4, free, T3  Dyspnea on exertion  Prediabetes - Plan: Hemoglobin A1c, Insulin, random  Screening for depression  Class 2 obesity with serious comorbidity and body mass index (BMI) of 37.0 to 37.9 in adult, unspecified obesity type  PLAN: Fatigue Brenda Williams was informed that her fatigue may be related to obesity, depression or many other causes. Labs will be ordered, and in the meanwhile Brenda Williams has agreed to work on diet, exercise and weight loss to help with fatigue. Proper sleep hygiene was discussed including the need for 7-8 hours of quality sleep each night. A sleep study was not ordered based on symptoms and Epworth score.  Dyspnea on exertion Brenda Williams's shortness of breath appears to be obesity related and exercise  induced. She has agreed to work on [employer]  loss and gradually increase exercise to treat her exercise induced shortness of breath. If Brenda Williams follows our instructions and loses weight without improvement of her shortness of breath, we will plan to refer to pulmonology. We will monitor this condition regularly. Brenda Williams agrees to this plan.  Pre-Diabetes Brenda Williams will work on weight loss, exercise, and decreasing simple carbohydrates in her diet to help decrease the risk of diabetes. She was informed that eating too many simple carbohydrates or too many calories at one sitting increases the likelihood of GI side effects. We will check labs and Carolle agreed to follow up with [place] as directed to monitor her progress.  Depression Screen Brenda Williams had a strongly positive depression screening. Depression is commonly associated with obesity and often results in emotional eating behaviors. We will monitor this closely and work on CBT to help improve the non-hunger eating patterns. Referral to Psychology may be required if no improvement is seen as she continues in our clinic.  Obesity Brenda Williams is currently in the action stage of change and her goal is to continue with weight loss efforts. I recommend Brenda Williams begin the structured treatment plan as follows:  She has agreed to follow the Category 2 plan Brenda Williams has been instructed to eventually work up to a goal of 150 minutes of combined cardio and strengthening exercise per week for weight loss and overall health benefits. We discussed the following Behavioral Modification Strategies today: meal planning & cooking strategies, increasing lean protein intake and emotional eating strategies  Kineta has agreed to join our obesity program and follow up with our clinic in 2 weeks. She was informed of the importance of frequent follow up visits to maximize her success with intensive lifestyle modifications for her multiple health conditions. She was informed we would  discuss her lab results at her next visit unless there is a critical issue that needs to be addressed sooner. Shatora agreed to keep her next visit at the agreed upon time to discuss these results.  I, Doreene Nest, am acting as transcriptionist for Dennard Nip, MD  I have reviewed the above documentation for accuracy and completeness, and I agree with the above. -Dennard Nip, MD   OBESITY BEHAVIORAL INTERVENTION VISIT  Today's visit was # 1 out of 14.  Starting weight: 236 lbs Starting date: 01/28/17 Today's weight : 236 lbs Today's date: 01/28/2017 Total lbs lost to date: 0 (Patients must lose 7 lbs in the first 6 months to continue with counseling)   ASK: We discussed the diagnosis of obesity with [name] today and Brenda Williams agreed to give [place] permission to discuss obesity behavioral modification therapy today.  ASSESS: Crystina has the diagnosis of obesity and her BMI today is 38 Laloni is in the action stage of change   ADVISE: Hisako was educated on the multiple health risks of obesity as well as the benefit of weight loss to improve her health. She was advised of the need for long term treatment and the importance of lifestyle modifications.  AGREE: Multiple dietary modification options and treatment options were discussed and  [place] agreed to follow the Category 2 plan We discussed the following Behavioral Modification Strategies today: meal planning & cooking strategies, increasing lean protein intake and emotional eating strategies

## 2017-01-29 DIAGNOSIS — Z719 Counseling, unspecified: Secondary | ICD-10-CM | POA: Diagnosis not present

## 2017-01-29 LAB — LIPID PANEL WITH LDL/HDL RATIO
Cholesterol, Total: 233 mg/dL — ABNORMAL HIGH (ref 100–199)
HDL: 53 mg/dL (ref >39)
LDL Calculated: 160 mg/dL — ABNORMAL HIGH (ref 0–99)
LDL/HDL RATIO: 3 ratio (ref 0.0–3.2)
TRIGLYCERIDES: 99 mg/dL (ref 0–149)
VLDL Cholesterol Cal: 20 mg/dL (ref 5–40)

## 2017-01-29 LAB — CBC WITH DIFFERENTIAL
BASOS ABS: 0 10*3/uL (ref 0.0–0.2)
Basos: 1 %
EOS (ABSOLUTE): 0 10*3/uL (ref 0.0–0.4)
Eos: 1 %
HEMOGLOBIN: 13.3 g/dL (ref 11.1–15.9)
Hematocrit: 40 % (ref 34.0–46.6)
IMMATURE GRANS (ABS): 0 10*3/uL (ref 0.0–0.1)
IMMATURE GRANULOCYTES: 0 %
LYMPHS: 40 %
Lymphocytes Absolute: 1.7 10*3/uL (ref 0.7–3.1)
MCH: 29.7 pg (ref 26.6–33.0)
MCHC: 33.3 g/dL (ref 31.5–35.7)
MCV: 89 fL (ref 79–97)
MONOCYTES: 4 %
Monocytes Absolute: 0.2 10*3/uL (ref 0.1–0.9)
Neutrophils Absolute: 2.3 10*3/uL (ref 1.4–7.0)
Neutrophils: 54 %
RBC: 4.48 x10E6/uL (ref 3.77–5.28)
RDW: 15.1 % (ref 12.3–15.4)
WBC: 4.2 10*3/uL (ref 3.4–10.8)

## 2017-01-29 LAB — FOLATE: FOLATE: 11.2 ng/mL (ref >3.0)

## 2017-01-29 LAB — T4, FREE: Free T4: 1.04 ng/dL (ref 0.82–1.77)

## 2017-01-29 LAB — COMPREHENSIVE METABOLIC PANEL
ALT: 18 IU/L (ref 0–32)
AST: 14 IU/L (ref 0–40)
Albumin/Globulin Ratio: 1.4 (ref 1.2–2.2)
Albumin: 4.2 g/dL (ref 3.5–5.5)
Alkaline Phosphatase: 55 IU/L (ref 39–117)
BILIRUBIN TOTAL: 0.3 mg/dL (ref 0.0–1.2)
BUN/Creatinine Ratio: 15 (ref 9–23)
BUN: 10 mg/dL (ref 6–20)
CHLORIDE: 103 mmol/L (ref 96–106)
CO2: 21 mmol/L (ref 20–29)
Calcium: 9.4 mg/dL (ref 8.7–10.2)
Creatinine, Ser: 0.67 mg/dL (ref 0.57–1.00)
GFR calc Af Amer: 130 mL/min/{1.73_m2} (ref >59)
GFR calc non Af Amer: 113 mL/min/{1.73_m2} (ref >59)
Globulin, Total: 2.9 g/dL (ref 1.5–4.5)
Glucose: 103 mg/dL — ABNORMAL HIGH (ref 65–99)
POTASSIUM: 4.4 mmol/L (ref 3.5–5.2)
Sodium: 140 mmol/L (ref 134–144)
Total Protein: 7.1 g/dL (ref 6.0–8.5)

## 2017-01-29 LAB — VITAMIN B12: Vitamin B-12: 415 pg/mL (ref 232–1245)

## 2017-01-29 LAB — INSULIN, RANDOM: INSULIN: 12.8 u[IU]/mL (ref 2.6–24.9)

## 2017-01-29 LAB — TSH: TSH: 0.748 u[IU]/mL (ref 0.450–4.500)

## 2017-01-29 LAB — VITAMIN D 25 HYDROXY (VIT D DEFICIENCY, FRACTURES): Vit D, 25-Hydroxy: 14.2 ng/mL — ABNORMAL LOW (ref 30.0–100.0)

## 2017-01-29 LAB — HEMOGLOBIN A1C
ESTIMATED AVERAGE GLUCOSE: 128 mg/dL
HEMOGLOBIN A1C: 6.1 % — AB (ref 4.8–5.6)

## 2017-01-29 LAB — T3: T3, Total: 133 ng/dL (ref 71–180)

## 2017-01-29 MED ORDER — SIROLIMUS 1 MG PO TABS: 1.0000 mg | ORAL_TABLET | Freq: Every day | ORAL | 0 refills | Status: DC

## 2017-01-29 NOTE — Telephone Encounter (Signed)
Yes please call in for her 

## 2017-01-29 NOTE — Telephone Encounter (Signed)
Spoke with pt and she is aware to keep her appt with BO and she is aware that RB gave the ok for rx to be sent in.  This was sent to her pharmacy. She had no additional questions at this time. Nothing further is needed

## 2017-02-05 DIAGNOSIS — Z719 Counseling, unspecified: Secondary | ICD-10-CM | POA: Diagnosis not present

## 2017-02-11 ENCOUNTER — Ambulatory Visit (INDEPENDENT_AMBULATORY_CARE_PROVIDER_SITE_OTHER): Payer: 59 | Admitting: Family Medicine

## 2017-02-11 VITALS — BP 125/75 | HR 72 | Temp 97.5°F | Ht 67.0 in | Wt 233.0 lb

## 2017-02-11 DIAGNOSIS — Z6836 Body mass index (BMI) 36.0-36.9, adult: Secondary | ICD-10-CM | POA: Diagnosis not present

## 2017-02-11 DIAGNOSIS — E559 Vitamin D deficiency, unspecified: Secondary | ICD-10-CM

## 2017-02-11 DIAGNOSIS — Z9189 Other specified personal risk factors, not elsewhere classified: Secondary | ICD-10-CM | POA: Diagnosis not present

## 2017-02-11 DIAGNOSIS — R7303 Prediabetes: Secondary | ICD-10-CM | POA: Diagnosis not present

## 2017-02-11 DIAGNOSIS — E669 Obesity, unspecified: Secondary | ICD-10-CM

## 2017-02-11 DIAGNOSIS — IMO0001 Reserved for inherently not codable concepts without codable children: Secondary | ICD-10-CM

## 2017-02-11 MED ORDER — VITAMIN D (ERGOCALCIFEROL) 1.25 MG (50000 UNIT) PO CAPS: 50000.0000 [IU] | ORAL_CAPSULE | ORAL | 0 refills | Status: DC

## 2017-02-11 MED ORDER — METFORMIN HCL 500 MG PO TABS: 500.0000 mg | ORAL_TABLET | Freq: Every day | ORAL | 0 refills | Status: DC

## 2017-02-11 NOTE — Progress Notes (Signed)
Office: [phone]  /  Fax: [phone]   HPI:   Chief Complaint: OBESITY Brenda Williams is here to discuss her progress with her obesity treatment plan. She is on the  follow the Category 2 plan and is following her eating plan approximately 75 % of the time. She states she is exercising 0 minutes 0 times per week. Brenda Williams did well weight loss but she struggled with cravings and hunger on the category 2 plan. She felt her 3 snacks did not do enough to curb her hunger. Her weight is 233 lb (105.7 kg) today and has had a weight loss of 3 pounds over a period of 2 weeks since her last visit. She has lost 3 lbs since starting treatment with [place].  Vitamin D deficiency Brenda Williams has a new diagnosis of vitamin D deficiency. She is not currently taking vit D. Brenda Williams admits fatigue and denies nausea, vomiting or muscle weakness.  Pre-Diabetes Brenda Williams has a diagnosis of pre-diabetes based on her elevated Hgb A1c at 6.1 and elevated fasting glucose. She was informed this puts her at greater risk of developing diabetes. She is not taking metformin currently but has been on metformin in the past. She is not currently using birth control due to wanting another child. Brenda Williams continues to work on diet and exercise to decrease risk of diabetes. She admits polyphagia and denies nausea or hypoglycemia.  At risk for diabetes Brenda Williams is at higher than average risk for developing diabetes due to her obesity and pre-diabetes. She currently denies polyuria or polydipsia.  ALLERGIES: Allergies  Allergen Reactions   Fentanyl Itching    Benadryl effective for itching.    MEDICATIONS: Current Outpatient Prescriptions on File Prior to Visit  Medication Sig Dispense Refill   acetaminophen (TYLENOL) 500 MG tablet Take 500 mg by mouth every 6 (six) hours as needed.     ibuprofen (ADVIL,MOTRIN) 800 MG tablet Take 800 mg by mouth every 8 (eight) hours as needed.     sirolimus (RAPAMUNE) 1 MG tablet Take 1 tablet (1  mg total) by mouth daily. 30 tablet 0   No current facility-administered medications on file prior to visit.     PAST MEDICAL HISTORY: Past Medical History:  Diagnosis Date   Abnormal Pap smear 2006   leep   Anxiety    Arthritis    osteoarthritis   Back pain    Complication of anesthesia    2014 d[facility]c [facility], HAD ??  SEIZURE/ SHAKING   Constipation    Depression    Dyspnea    GERD (gastroesophageal reflux disease)    Headache    Herpes    never had outbreak. pos per blood, doesn't know which type   History of chicken pox    Joint pain    Lactose intolerance    Lymphangiomatosis    Polycystic ovarian syndrome    Prediabetes    Shortness of breath dyspnea    W/ EXERTION    SVD (spontaneous vaginal delivery)    x 1   Varicose vein of leg    Vitamin D deficiency     PAST SURGICAL HISTORY: Past Surgical History:  Procedure Laterality Date   DILATION AND EVACUATION N/A 09/14/2012   Procedure: DILATATION AND EVACUATION;  Surgeon: Allena Katz, MD;  Location: [facility];  Service: Gynecology;  Laterality: N/A;   HYSTEROSCOPY  2009   uterine polyp   IVF Retrival  2010, 2011   [facility]   right knee  LEEP  2006   LUNG BIOPSY Right 12/28/2015   Procedure: RIGHT LUNG BIOPSY;  Surgeon: Ivin Poot, MD;  Location: [place];  Service: Thoracic;  Laterality: Right;   SPHINCTEROTOMY  2010   uterine polyp removal     VIDEO ASSISTED THORACOSCOPY Right 12/28/2015   Procedure: RIGHT VIDEO ASSISTED THORACOSCOPY;  Surgeon: Ivin Poot, MD;  [facility];  Service: Thoracic;  Laterality: Right;    SOCIAL HISTORY: Social History  Substance Use Topics   Smoking status: Never Smoker   Smokeless tobacco: Never Used   Alcohol use 0.0 oz/week     Comment: OCC    FAMILY HISTORY: Family History  Problem Relation Age of Onset   Diabetes Maternal Grandmother    Heart disease Maternal Grandmother    Hypertension Maternal  Grandfather    Cancer Maternal Grandfather 32       bone, colon    Diabetes Maternal Grandfather    Diabetes Mother    Cancer Mother 31       breast cancer   Hyperlipidemia Mother    Depression Mother    Obesity Mother    Hypertension Father    Hyperlipidemia Father    Thyroid disease Father    Alcohol abuse Father    Drug abuse Father    Obesity Father    Anesthesia problems Neg Hx     ROS: Review of Systems  Constitutional: Positive for malaise/fatigue and weight loss.  Gastrointestinal: Negative for nausea and vomiting.  Genitourinary: Negative for frequency.  Musculoskeletal:       Negative muscle weakness   Endo/Heme/Allergies: Negative for polydipsia.       Polyphagia Negative hypoglycemia    PHYSICAL EXAM: Blood pressure 125/75, pulse 72, temperature (!) 97.5 F (36.4 C), temperature source Oral, height 5\' 7"  (1.702 m), weight 233 lb (105.7 kg), last menstrual period 01/13/2017, SpO2 98 %. Body mass index is 36.49 kg/m. Physical Exam  Constitutional: She is oriented to person, place, and time. She appears well-developed and well-nourished.  Cardiovascular: Normal rate.   Pulmonary/Chest: Effort normal.  Musculoskeletal: Normal range of motion.  Neurological: She is oriented to person, place, and time.  Skin: Skin is warm and dry.  Psychiatric: She has a normal mood and affect. Her behavior is normal.  Vitals reviewed.   RECENT LABS AND TESTS: BMET    Component Value Date/Time   NA 140 01/28/2017 0951   K 4.4 01/28/2017 0951   CL 103 01/28/2017 0951   CO2 21 01/28/2017 0951   GLUCOSE 103 (H) 01/28/2017 0951   GLUCOSE 100 (H) 12/30/2015 0320   BUN 10 01/28/2017 0951   CREATININE 0.67 01/28/2017 0951   CALCIUM 9.4 01/28/2017 0951   GFRNONAA 113 01/28/2017 0951   GFRAA 130 01/28/2017 0951   Lab Results  Component Value Date   HGBA1C 6.1 (H) 01/28/2017   HGBA1C 6.0 09/18/2015   Lab Results  Component Value Date   INSULIN 12.8  01/28/2017   CBC    Component Value Date/Time   WBC 4.2 01/28/2017 0951   WBC 9.9 03/14/2016 1041   RBC 4.48 01/28/2017 0951   RBC 4.79 03/14/2016 1041   HGB 13.3 01/28/2017 0951   HCT 40.0 01/28/2017 0951   PLT 272.0 03/14/2016 1041   MCV 89 01/28/2017 0951   MCH 29.7 01/28/2017 0951   MCH 28.1 12/30/2015 0320   MCHC 33.3 01/28/2017 0951   MCHC 33.3 03/14/2016 1041   RDW 15.1 01/28/2017 0951   LYMPHSABS 1.7  01/28/2017 0951   MONOABS 0.7 03/14/2016 1041   EOSABS 0.0 01/28/2017 0951   BASOSABS 0.0 01/28/2017 0951   Iron/TIBC/Ferritin/ %Sat No results found for: IRON, TIBC, FERRITIN, IRONPCTSAT Lipid Panel     Component Value Date/Time   CHOL 233 (H) 01/28/2017 0951   TRIG 99 01/28/2017 0951   HDL 53 01/28/2017 0951   CHOLHDL 4 09/18/2015 0743   VLDL 12.6 09/18/2015 0743   LDLCALC 160 (H) 01/28/2017 0951   LDLDIRECT 153.4 12/10/2012 1518   Hepatic Function Panel     Component Value Date/Time   PROT 7.1 01/28/2017 0951   ALBUMIN 4.2 01/28/2017 0951   AST 14 01/28/2017 0951   ALT 18 01/28/2017 0951   ALKPHOS 55 01/28/2017 0951   BILITOT 0.3 01/28/2017 0951      Component Value Date/Time   TSH 0.748 01/28/2017 0951   TSH 0.92 09/18/2015 0743   TSH 0.84 12/01/2014 0807    ASSESSMENT AND PLAN: Prediabetes - Plan: metFORMIN (GLUCOPHAGE) 500 MG tablet  Vitamin D deficiency  At risk for diabetes mellitus  Class 2 obesity with serious comorbidity and body mass index (BMI) of 36.0 to 36.9 in adult, unspecified obesity type  PLAN:  Vitamin D Deficiency Brenda Williams was informed that low vitamin D levels contributes to fatigue and are associated with obesity, breast, and colon cancer. She agrees to start to take prescription Vit D @50 ,000 IU every week #4 with no refills and will follow up for routine testing of vitamin D, at least 2-3 times per year. She was informed of the risk of over-replacement of vitamin D and agrees to not increase her dose unless he discusses  this with [place] first. Brenda Williams agrees to follow up with our clinic in 2 weeks.  Pre-Diabetes Brenda Williams will continue to work on weight loss, exercise, and decreasing simple carbohydrates in her diet to help decrease the risk of diabetes. We dicussed metformin including benefits and risks. She was informed that eating too many simple carbohydrates or too many calories at one sitting increases the likelihood of GI side effects. Brenda Williams agrees to start to take metformin for now and a prescription was written today for metformin 500 mg every morning #30 with no refills. She was informed of the potential for metformin to improve fertility and weight loss. We will monitor her LMP closely and [place] agreed to follow up with [place] as directed to monitor her progress.  Diabetes risk counselling Brenda Williams was given extended (15 minutes) diabetes prevention counseling today. She is 36 y.o. female and has risk factors for diabetes including obesity and pre-diabetes. We discussed intensive lifestyle modifications today with an emphasis on weight loss as well as increasing exercise and decreasing simple carbohydrates in her diet.  Obesity Brenda Williams is currently in the action stage of change. As such, her goal is to continue with weight loss efforts She has agreed to follow the Category 2 plan Brenda Williams has been instructed to work up to a goal of 150 minutes of combined cardio and strengthening exercise per week for weight loss and overall health benefits. We discussed the following Behavioral Modification Strategies today: meal planning & cooking strategies, increasing lean protein intake and decreasing simple carbohydrates   Brenda Williams has agreed to follow up with our clinic in 2 weeks. She was informed of the importance of frequent follow up visits to maximize her success with intensive lifestyle modifications for her multiple health conditions.  Corey Skains, am acting as transcriptionist for Dennard Nip, MD  I have  reviewed the above documentation for accuracy and completeness, and I agree with the above. -Dennard Nip, MD   OBESITY BEHAVIORAL INTERVENTION VISIT  Today's visit was # 2 out of 22.  Starting weight: 236 lbs Starting date: 01/28/17 Today's weight : 233 lbs Today's date: 02/11/2017 Total lbs lost to date: 3 (Patients must lose 7 lbs in the first 6 months to continue with counseling)   ASK: We discussed the diagnosis of obesity with Brenda Williams today and Brenda Williams agreed to give [place] permission to discuss obesity behavioral modification therapy today.  ASSESS: Brenda Williams has the diagnosis of obesity and her BMI today is 36.6 Brenda Williams is in the action stage of change   ADVISE: Brenda Williams was educated on the multiple health risks of obesity as well as the benefit of weight loss to improve her health. She was advised of the need for long term treatment and the importance of lifestyle modifications.  AGREE: Multiple dietary modification options and treatment options were discussed and  Brenda Williams agreed to follow the Category 2 plan We discussed the following Behavioral Modification Strategies today: meal planning & cooking strategies, increasing lean protein intake and decreasing simple carbohydrates

## 2017-02-16 ENCOUNTER — Encounter: Payer: Self-pay | Admitting: Emergency Medicine

## 2017-02-18 ENCOUNTER — Ambulatory Visit (INDEPENDENT_AMBULATORY_CARE_PROVIDER_SITE_OTHER): Payer: 59 | Admitting: Pulmonary Disease

## 2017-02-18 ENCOUNTER — Encounter: Payer: Self-pay | Admitting: Pulmonary Disease

## 2017-02-18 VITALS — BP 116/70 | HR 78 | Ht 67.0 in | Wt 237.4 lb

## 2017-02-18 DIAGNOSIS — J8481 Lymphangioleiomyomatosis: Secondary | ICD-10-CM | POA: Diagnosis not present

## 2017-02-18 NOTE — Progress Notes (Signed)
[facility]   Chief Complaint  Patient presents with  . Follow-up    Pt was started on sirolimus med. and is here to discuss that. Pt states that the first time she developed a bad cold along with a fever and isn't sure if it was from the med or sick coworkers. Pt says the second time she has had a few dizzy spells and has been more fatigued.     Primary Pulmonologist: Dr. Lamonte Sakai  Current Outpatient Prescriptions on File Prior to Visit  Medication Sig  . acetaminophen (TYLENOL) 500 MG tablet Take 500 mg by mouth every 6 (six) hours as needed.  Marland Kitchen ibuprofen (ADVIL,MOTRIN) 800 MG tablet Take 800 mg by mouth every 8 (eight) hours as needed.  . metFORMIN (GLUCOPHAGE) 500 MG tablet Take 1 tablet (500 mg total) by mouth daily with breakfast.  . sirolimus (RAPAMUNE) 1 MG tablet Take 1 tablet (1 mg total) by mouth daily.  . Vitamin D, Ergocalciferol, (DRISDOL) 50000 units CAPS capsule Take 1 capsule (50,000 Units total) by mouth every 7 (seven) days.   No current facility-administered medications on file prior to visit.      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  Past Medical Hx:  has a past medical history of Abnormal Pap smear (2006); Anxiety; Arthritis; Back pain; Complication of anesthesia; Constipation; Depression; Dyspnea; GERD (gastroesophageal reflux disease); Headache; Herpes; History of chicken pox; Joint pain; Lactose intolerance; Lymphangiomatosis; Polycystic ovarian syndrome; Prediabetes; Shortness of breath dyspnea; SVD (spontaneous vaginal delivery); Varicose vein of leg; and Vitamin D deficiency.   Past Surgical hx, Allergies, Family hx, Social hx all reviewed.  Vital Signs BP 116/70 (BP Location: Left Arm, Patient Position:  Sitting, Cuff Size: Normal)   Pulse 78   Ht 5\' 7"  (1.702 m)   Wt 237 lb 6.4 oz (107.7 kg)   SpO2 100%   BMI 37.18 kg/m   History of Present Illness Brenda Williams is a 36 y.o. 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    The patient was seen at the [facility] 3 weeks ago and she was told she had abnormal "musical breath sounds".  She was encouraged to restart her Sirolimus.  The patient was initially started on sirolimus in March 2018.  She only used the medication for 10 days.  She reports she had a cold and then a cut on her finger that looked infected and she "freaked out" and stopped the medication.  She re-started Sirolimus approximately 1.5 week ago.  Also started on metformin at the same time.  She reports feeling "super tired and dizzy".  Denies falls, presyncope, headaches, cough with sputum production.  Reports she has dry cough typcally.  No fevers, chills.  Physical Exam  General - well developed adult F in no acute distress ENT - No sinus tenderness, no oral exudate, no LAN Cardiac - s1s2 regular, no murmur Chest - even/non-labored, lungs bilaterally clear. No wheeze/rales Back - No focal tenderness Abd - Soft, non-tender Ext - No edema Neuro - Normal strength Skin - No rashes Psych - normal mood, and behavior   Assessment/Plan  Discussion:  36 y/o F with known hx of LAM, recommended to be on Sirolimus but has not taken seen for follow  up of medication. She reports she restarted the medication approximately one week ago.  We discussed restarting Sirolimus, possible side effects of metformin and sirolimus and for her to pay close attention to her symptoms.     Plan: Pt instructed to restart Sirolimus and note any new symptoms Follow up in 3 months for assessment of lipid panel and urine protein  Patient Instructions  1.  Continue to take your Sirolimus as prescribed 2.  We  will bring you back after 3 months of therapy to review your labs and see Dr. Lamonte Sakai 3.  Your lung exam does not have any wheezing or concerns today 3.  Please call if you have new or worsening symptoms 4.  Follow up with your primary care as needed    Noe Gens, NP-C [place]  [phone] 02/18/2017, 9:23 PM

## 2017-02-18 NOTE — Patient Instructions (Addendum)
1.  Continue to take your Sirolimus as prescribed 2.  We will bring you back after 3 months of therapy to review your labs and see Dr. Lamonte Sakai 3.  Your lung exam does not have any wheezing or concerns today 3.  Please call if you have new or worsening symptoms 4.  Follow up with your primary care as needed

## 2017-02-23 ENCOUNTER — Ambulatory Visit (INDEPENDENT_AMBULATORY_CARE_PROVIDER_SITE_OTHER): Payer: 59 | Admitting: Family Medicine

## 2017-02-23 VITALS — BP 99/63 | HR 72 | Temp 98.2°F | Ht 67.0 in | Wt 228.0 lb

## 2017-02-23 DIAGNOSIS — Z6835 Body mass index (BMI) 35.0-35.9, adult: Secondary | ICD-10-CM | POA: Diagnosis not present

## 2017-02-23 DIAGNOSIS — R7303 Prediabetes: Secondary | ICD-10-CM

## 2017-02-23 DIAGNOSIS — E669 Obesity, unspecified: Secondary | ICD-10-CM

## 2017-02-23 NOTE — Progress Notes (Signed)
Office: [phone]  /  Fax: [phone]   HPI:   Chief Complaint: OBESITY Brenda Williams is here to discuss her progress with her obesity treatment plan. She is on the  follow the Category 2 plan and is following her eating plan approximately 50 % of the time. She states she is exercising 0 minutes 0 times per week. Brenda Williams continues to do well with weight loss on the category 2 plan but has deviated from the plan more. She notes significant sabotage from her husband but has tried to control her portions and make smarter choices. She would like more lunch options. Her weight is 228 lb (103.4 kg) today and has had a weight loss of 5 pounds over a period of 2 weeks since her last visit. She has lost 8 lbs since starting treatment with [place].  Pre-Diabetes Brenda Williams has a diagnosis of pre-diabetes based on her elevated Hgb A1c and was informed this puts her at greater risk of developing diabetes. She started taking metformin and rapamune at the same time and feels tired and sluggish. She is uncertain which medications may have contributed to this. She continues to work on diet and exercise to decrease risk of diabetes. She denies nausea or hypoglycemia.    ALLERGIES: Allergies  Allergen Reactions   Fentanyl Itching    Benadryl effective for itching.    MEDICATIONS: Current Outpatient Prescriptions on File Prior to Visit  Medication Sig Dispense Refill   acetaminophen (TYLENOL) 500 MG tablet Take 500 mg by mouth every 6 (six) hours as needed.     ibuprofen (ADVIL,MOTRIN) 800 MG tablet Take 800 mg by mouth every 8 (eight) hours as needed.     metFORMIN (GLUCOPHAGE) 500 MG tablet Take 1 tablet (500 mg total) by mouth daily with breakfast. 30 tablet 0   sirolimus (RAPAMUNE) 1 MG tablet Take 1 tablet (1 mg total) by mouth daily. 30 tablet 0   Vitamin D, Ergocalciferol, (DRISDOL) 50000 units CAPS capsule Take 1 capsule (50,000 Units total) by mouth every 7 (seven) days. 4 capsule 0   No  current facility-administered medications on file prior to visit.     PAST MEDICAL HISTORY: Past Medical History:  Diagnosis Date   Abnormal Pap smear 2006   leep   Anxiety    Arthritis    osteoarthritis   Back pain    Complication of anesthesia    [facility], HAD ??  SEIZURE/ SHAKING   Constipation    Depression    Dyspnea    GERD (gastroesophageal reflux disease)    Headache    Herpes    never had outbreak. pos per blood, doesn't know which type   History of chicken pox    Joint pain    Lactose intolerance    Lymphangiomatosis    Polycystic ovarian syndrome    Prediabetes    Shortness of breath dyspnea    W/ EXERTION    SVD (spontaneous vaginal delivery)    x 1   Varicose vein of leg    Vitamin D deficiency     PAST SURGICAL HISTORY: Past Surgical History:  Procedure Laterality Date   DILATION AND EVACUATION N/A 09/14/2012   Procedure: DILATATION AND EVACUATION;  Surgeon: Allena Katz, MD;  Location: [facility];  Service: Gynecology;  Laterality: N/A;   HYSTEROSCOPY  2009   uterine polyp   IVF Retrival  2010, 2011   [place]   right knee   LEEP  2006  LUNG BIOPSY Right 12/28/2015   Procedure: RIGHT LUNG BIOPSY;  Surgeon: Ivin Poot, MD;  Location: [facility];  Service: Thoracic;  Laterality: Right;   SPHINCTEROTOMY  2010   uterine polyp removal     VIDEO ASSISTED THORACOSCOPY Right 12/28/2015   Procedure: RIGHT VIDEO ASSISTED THORACOSCOPY;  Surgeon: Ivin Poot, MD;  Location: [facility];  Service: Thoracic;  Laterality: Right;    SOCIAL HISTORY: Social History  Substance Use Topics   Smoking status: Never Smoker   Smokeless tobacco: Never Used   Alcohol use 0.0 oz/week     Comment: OCC    FAMILY HISTORY: Family History  Problem Relation Age of Onset   Diabetes Maternal Grandmother    Heart disease Maternal Grandmother    Hypertension Maternal Grandfather    Cancer Maternal Grandfather 20        bone, colon    Diabetes Maternal Grandfather    Diabetes Mother    Cancer Mother 56       breast cancer   Hyperlipidemia Mother    Depression Mother    Obesity Mother    Hypertension Father    Hyperlipidemia Father    Thyroid disease Father    Alcohol abuse Father    Drug abuse Father    Obesity Father    Anesthesia problems Neg Hx     ROS: Review of Systems  Constitutional: Positive for weight loss.  Gastrointestinal: Negative for nausea.  Endo/Heme/Allergies:       Negative hypoglycemia    PHYSICAL EXAM: Blood pressure 99/63, pulse 72, temperature 98.2 F (36.8 C), temperature source Oral, height 5\' 7"  (1.702 m), weight 228 lb (103.4 kg), SpO2 97 %. Body mass index is 35.71 kg/m. Physical Exam  Constitutional: She is oriented to person, place, and time. She appears well-developed and well-nourished.  Cardiovascular: Normal rate.   Pulmonary/Chest: Effort normal.  Neurological: She is oriented to person, place, and time.  Skin: Skin is warm and dry.  Psychiatric: She has a normal mood and affect. Her behavior is normal.  Vitals reviewed.   RECENT LABS AND TESTS: BMET    Component Value Date/Time   NA 140 01/28/2017 0951   K 4.4 01/28/2017 0951   CL 103 01/28/2017 0951   CO2 21 01/28/2017 0951   GLUCOSE 103 (H) 01/28/2017 0951   GLUCOSE 100 (H) 12/30/2015 0320   BUN 10 01/28/2017 0951   CREATININE 0.67 01/28/2017 0951   CALCIUM 9.4 01/28/2017 0951   GFRNONAA 113 01/28/2017 0951   GFRAA 130 01/28/2017 0951   Lab Results  Component Value Date   HGBA1C 6.1 (H) 01/28/2017   HGBA1C 6.0 09/18/2015   Lab Results  Component Value Date   INSULIN 12.8 01/28/2017   CBC    Component Value Date/Time   WBC 4.2 01/28/2017 0951   WBC 9.9 03/14/2016 1041   RBC 4.48 01/28/2017 0951   RBC 4.79 03/14/2016 1041   HGB 13.3 01/28/2017 0951   HCT 40.0 01/28/2017 0951   PLT 272.0 03/14/2016 1041   MCV 89 01/28/2017 0951   MCH 29.7 01/28/2017 0951    MCH 28.1 12/30/2015 0320   MCHC 33.3 01/28/2017 0951   MCHC 33.3 03/14/2016 1041   RDW 15.1 01/28/2017 0951   LYMPHSABS 1.7 01/28/2017 0951   MONOABS 0.7 03/14/2016 1041   EOSABS 0.0 01/28/2017 0951   BASOSABS 0.0 01/28/2017 0951   Iron/TIBC/Ferritin/ %Sat No results found for: IRON, TIBC, FERRITIN, IRONPCTSAT Lipid Panel     Component Value Date/Time  CHOL 233 (H) 01/28/2017 0951   TRIG 99 01/28/2017 0951   HDL 53 01/28/2017 0951   CHOLHDL 4 09/18/2015 0743   VLDL 12.6 09/18/2015 0743   LDLCALC 160 (H) 01/28/2017 0951   LDLDIRECT 153.4 12/10/2012 1518   Hepatic Function Panel     Component Value Date/Time   PROT 7.1 01/28/2017 0951   ALBUMIN 4.2 01/28/2017 0951   AST 14 01/28/2017 0951   ALT 18 01/28/2017 0951   ALKPHOS 55 01/28/2017 0951   BILITOT 0.3 01/28/2017 0951      Component Value Date/Time   TSH 0.748 01/28/2017 0951   TSH 0.92 09/18/2015 0743   TSH 0.84 12/01/2014 0807    ASSESSMENT AND PLAN: Prediabetes  Class 2 obesity without serious comorbidity with body mass index (BMI) of 35.0 to 35.9 in adult, unspecified obesity type  PLAN:  Pre-Diabetes Brenda Williams will continue to work on weight loss, exercise, and decreasing simple carbohydrates in her diet to help decrease the risk of diabetes. We dicussed metformin including benefits and risks. She was informed that eating too many simple carbohydrates or too many calories at one sitting increases the likelihood of GI side effects. Conny agrees to continue metformin for now and a prescription was not written today. We will re-check labs in 1 month and Brenda Williams agreed to follow up with [place] as directed to monitor her progress.  We spent > than 50% of the 15 minute visit on the counseling as documented in the note.  Obesity Brenda Williams is currently in the action stage of change. As such, her goal is to continue with weight loss efforts She has agreed to change keep a food journal with 350 to 500 calories and 30  grams of protein at lunch and follow the Category 2 plan Brenda Williams has been instructed to work up to a goal of 150 minutes of combined cardio and strengthening exercise per week for weight loss and overall health benefits. We discussed the following Behavioral Modification Strategies today: meal planning & cooking strategies, increasing lean protein intake and decreasing simple carbohydrates   Brenda Williams has agreed to follow up with our clinic in 2 to 3 weeks. She was informed of the importance of frequent follow up visits to maximize her success with intensive lifestyle modifications for her multiple health conditions.  I, Doreene Nest, am acting as transcriptionist for Dennard Nip, MD  I have reviewed the above documentation for accuracy and completeness, and I agree with the above. -Dennard Nip, MD   OBESITY BEHAVIORAL INTERVENTION VISIT  Today's visit was # 3 out of 22.  Starting weight: 236 lbs Starting date: 01/28/17 Today's weight : 228 lbs Today's date: 02/23/2017 Total lbs lost to date: 8 (Patients must lose 7 lbs in the first 6 months to continue with counseling)   ASK: We discussed the diagnosis of obesity with [name] today and Brenda Williams agreed to give [place] permission to discuss obesity behavioral modification therapy today.  ASSESS: Brenda Williams has the diagnosis of obesity and her BMI today is 35.8 Brenda Williams is in the action stage of change   ADVISE: Brenda Williams was educated on the multiple health risks of obesity as well as the benefit of weight loss to improve her health. She was advised of the need for long term treatment and the importance of lifestyle modifications.  AGREE: Multiple dietary modification options and treatment options were discussed and  Brenda Williams agreed to change to keep a food journal with 350 to 500 calories and 30 grams of protein  at lunch daily and follow the Category 2 plan We discussed the following Behavioral Modification Strategies today: meal  planning & cooking strategies, increasing lean protein intake and decreasing simple carbohydrates

## 2017-02-26 DIAGNOSIS — Z719 Counseling, unspecified: Secondary | ICD-10-CM | POA: Diagnosis not present

## 2017-03-10 ENCOUNTER — Telehealth: Payer: Self-pay | Admitting: Emergency Medicine

## 2017-03-12 NOTE — Telephone Encounter (Signed)
Ria Comment please advise once this paperwork has been completed. Thanks

## 2017-03-12 NOTE — Telephone Encounter (Signed)
Forms were given to RB yesterday. Will update chart once these are returned to me.

## 2017-03-13 NOTE — Telephone Encounter (Signed)
Rec'd completed forms - fwd to [employer] via interoffice mail -03/13/17 -pr

## 2017-03-17 ENCOUNTER — Ambulatory Visit (INDEPENDENT_AMBULATORY_CARE_PROVIDER_SITE_OTHER): Payer: 59 | Admitting: Family Medicine

## 2017-03-17 VITALS — BP 127/79 | HR 72 | Temp 98.0°F | Ht 67.0 in | Wt 228.0 lb

## 2017-03-17 DIAGNOSIS — R7303 Prediabetes: Secondary | ICD-10-CM

## 2017-03-17 DIAGNOSIS — F329 Major depressive disorder, single episode, unspecified: Secondary | ICD-10-CM | POA: Insufficient documentation

## 2017-03-17 DIAGNOSIS — Z9189 Other specified personal risk factors, not elsewhere classified: Secondary | ICD-10-CM

## 2017-03-17 DIAGNOSIS — E669 Obesity, unspecified: Secondary | ICD-10-CM | POA: Diagnosis not present

## 2017-03-17 DIAGNOSIS — F3289 Other specified depressive episodes: Secondary | ICD-10-CM | POA: Diagnosis not present

## 2017-03-17 DIAGNOSIS — E559 Vitamin D deficiency, unspecified: Secondary | ICD-10-CM

## 2017-03-17 DIAGNOSIS — IMO0001 Reserved for inherently not codable concepts without codable children: Secondary | ICD-10-CM

## 2017-03-17 DIAGNOSIS — F32A Depression, unspecified: Secondary | ICD-10-CM | POA: Insufficient documentation

## 2017-03-17 DIAGNOSIS — Z6835 Body mass index (BMI) 35.0-35.9, adult: Secondary | ICD-10-CM | POA: Diagnosis not present

## 2017-03-17 MED ORDER — METFORMIN HCL 500 MG PO TABS: 500.0000 mg | ORAL_TABLET | Freq: Every day | ORAL | 0 refills | Status: DC

## 2017-03-17 MED ORDER — VITAMIN D (ERGOCALCIFEROL) 1.25 MG (50000 UNIT) PO CAPS: 50000.0000 [IU] | ORAL_CAPSULE | ORAL | 0 refills | Status: DC

## 2017-03-17 MED ORDER — BUPROPION HCL ER (SR) 150 MG PO TB12: 150.0000 mg | ORAL_TABLET | Freq: Every day | ORAL | 0 refills | Status: DC

## 2017-03-18 ENCOUNTER — Other Ambulatory Visit: Payer: Self-pay | Admitting: Emergency Medicine

## 2017-03-18 ENCOUNTER — Encounter: Payer: Self-pay | Admitting: Emergency Medicine

## 2017-03-18 MED ORDER — SIROLIMUS 1 MG PO TABS: 1.0000 mg | ORAL_TABLET | Freq: Every day | ORAL | 5 refills | Status: DC

## 2017-03-18 NOTE — Progress Notes (Signed)
Office: [phone]  /  Fax: [phone]   HPI:   Chief Complaint: OBESITY Brenda Williams is here to discuss her progress with her obesity treatment plan. She is on the  follow the Category 2 plan and is following her eating plan approximately 20 % of the time. She states she is exercising 0 minutes 0 times per week. Brenda Williams did well maintaining weight but she is off her routine with her kids back in school and not planning meals ahead as well. She is ready to get back on track.  Her weight is 228 lb (103.4 kg) today and has not lost weight since her last visit. She has lost 8 lbs since starting treatment with [place].  Pre-Diabetes Brenda Williams has a diagnosis of pre-diabetes based on her elevated Hgb A1c and was informed this puts her at greater risk of developing diabetes. She is stable on metformin and continues to work on diet and exercise to decrease risk of diabetes. She denies nausea, vomiting or hypoglycemia.  At risk for diabetes Brenda Williams is at higher than average risk for developing diabetes due to her obesity and pre-diabetes. She currently denies polyuria or polydipsia.  Vitamin D deficiency Brenda Williams has a diagnosis of vitamin D deficiency. She is stable on Vit D, but not yet at goal. Brenda Williams admits fatigue but denies nausea, vomiting or muscle weakness.  Depression with emotional eating behaviors Brenda Williams has a new diagnosis of depression with emotional eating. She is struggling with emotional eating and using food for comfort to the extent that it is negatively impacting her health. Brenda Williams states that it is worse with increased stress and she is making less ideal food choices and feels out of control. She often snacks when she is not hungry. She has been working on behavior modification techniques to help reduce her emotional eating and has been somewhat successful. She shows no sign of suicidal or homicidal ideations.  Depression screen [facility] 2/9 01/28/2017 04/28/2016 09/17/2015  Decreased  Interest 3 0 0  Down, Depressed, Hopeless 3 0 0  PHQ - 2 Score 6 0 0  Altered sleeping 1 - -  Change in appetite 3 - -  Feeling bad or failure about yourself  2 - -  Trouble concentrating 2 - -  Moving slowly or fidgety/restless 2 - -  Suicidal thoughts 1 - -  PHQ-9 Score 17 - -     ALLERGIES: Allergies  Allergen Reactions  . Fentanyl Itching    Benadryl effective for itching.    MEDICATIONS: Current Outpatient Prescriptions on File Prior to Visit  Medication Sig Dispense Refill  . acetaminophen (TYLENOL) 500 MG tablet Take 500 mg by mouth every 6 (six) hours as needed.    Marland Kitchen ibuprofen (ADVIL,MOTRIN) 800 MG tablet Take 800 mg by mouth every 8 (eight) hours as needed.    . sirolimus (RAPAMUNE) 1 MG tablet Take 1 tablet (1 mg total) by mouth daily. 30 tablet 0   No current facility-administered medications on file prior to visit.     PAST MEDICAL HISTORY: Past Medical History:  Diagnosis Date  . Abnormal Pap smear 2006   leep  . Anxiety   . Arthritis    osteoarthritis  . Back pain   . Complication of anesthesia    [facility], HAD ??  SEIZURE/ SHAKING  . Constipation   . Depression   . Dyspnea   . GERD (gastroesophageal reflux disease)   . Headache   . Herpes    never had outbreak.  pos per blood, doesn't know which type  . History of chicken pox   . Joint pain   . Lactose intolerance   . Lymphangiomatosis   . Polycystic ovarian syndrome   . Prediabetes   . Shortness of breath dyspnea    W/ EXERTION   . SVD (spontaneous vaginal delivery)    x 1  . Varicose vein of leg   . Vitamin D deficiency     PAST SURGICAL HISTORY: Past Surgical History:  Procedure Laterality Date  . DILATION AND EVACUATION N/A 09/14/2012   Procedure: DILATATION AND EVACUATION;  Surgeon: Allena Katz, MD;  Location: [facility];  Service: Gynecology;  Laterality: N/A;  . HYSTEROSCOPY  2009   uterine polyp  . IVF Retrival  2010, 2011  . KNEE SURGERY  1998   right knee  .  LEEP  2006  . LUNG BIOPSY Right 12/28/2015   Procedure: RIGHT LUNG BIOPSY;  Surgeon: Ivin Poot, MD;  Location: [facility];  Service: Thoracic;  Laterality: Right;  . SPHINCTEROTOMY  2010  . uterine polyp removal    . VIDEO ASSISTED THORACOSCOPY Right 12/28/2015   Procedure: RIGHT VIDEO ASSISTED THORACOSCOPY;  Surgeon: Ivin Poot, MD;  Location: [facility];  Service: Thoracic;  Laterality: Right;    SOCIAL HISTORY: Social History  Substance Use Topics  . Smoking status: Never Smoker  . Smokeless tobacco: Never Used  . Alcohol use 0.0 oz/week     Comment: OCC    FAMILY HISTORY: Family History  Problem Relation Age of Onset  . Diabetes Maternal Grandmother   . Heart disease Maternal Grandmother   . Hypertension Maternal Grandfather   . Cancer Maternal Grandfather 70       bone, colon   . Diabetes Maternal Grandfather   . Diabetes Mother   . Cancer Mother 73       breast cancer  . Hyperlipidemia Mother   . Depression Mother   . Obesity Mother   . Hypertension Father   . Hyperlipidemia Father   . Thyroid disease Father   . Alcohol abuse Father   . Drug abuse Father   . Obesity Father   . Anesthesia problems Neg Hx     ROS: Review of Systems  Constitutional: Positive for malaise/fatigue. Negative for weight loss.  Gastrointestinal: Negative for nausea and vomiting.  Genitourinary: Negative for frequency.  Musculoskeletal:       Negative muscle weakness  Endo/Heme/Allergies: Negative for polydipsia.       Negative hypoglycemia  Psychiatric/Behavioral: Positive for depression. Negative for suicidal ideas.    PHYSICAL EXAM: Blood pressure 127/79, pulse 72, temperature 98 F (36.7 C), temperature source Oral, height 5\' 7"  (1.702 m), weight 228 lb (103.4 kg), last menstrual period 02/23/2017, SpO2 96 %. Body mass index is 35.71 kg/m. Physical Exam  Constitutional: She is oriented to person, place, and time. She appears well-developed and well-nourished.    Cardiovascular: Normal rate.   Pulmonary/Chest: Effort normal.  Musculoskeletal: Normal range of motion.  Neurological: She is oriented to person, place, and time.  Skin: Skin is warm and dry.  Psychiatric: She has a normal mood and affect. Her behavior is normal.  Vitals reviewed.   RECENT LABS AND TESTS: BMET    Component Value Date/Time   NA 140 01/28/2017 0951   K 4.4 01/28/2017 0951   CL 103 01/28/2017 0951   CO2 21 01/28/2017 0951   GLUCOSE 103 (H) 01/28/2017 0951   GLUCOSE 100 (H) 12/30/2015 0320  BUN 10 01/28/2017 0951   CREATININE 0.67 01/28/2017 0951   CALCIUM 9.4 01/28/2017 0951   GFRNONAA 113 01/28/2017 0951   GFRAA 130 01/28/2017 0951   Lab Results  Component Value Date   HGBA1C 6.1 (H) 01/28/2017   HGBA1C 6.0 09/18/2015   Lab Results  Component Value Date   INSULIN 12.8 01/28/2017   CBC    Component Value Date/Time   WBC 4.2 01/28/2017 0951   WBC 9.9 03/14/2016 1041   RBC 4.48 01/28/2017 0951   RBC 4.79 03/14/2016 1041   HGB 13.3 01/28/2017 0951   HCT 40.0 01/28/2017 0951   PLT 272.0 03/14/2016 1041   MCV 89 01/28/2017 0951   MCH 29.7 01/28/2017 0951   MCH 28.1 12/30/2015 0320   MCHC 33.3 01/28/2017 0951   MCHC 33.3 03/14/2016 1041   RDW 15.1 01/28/2017 0951   LYMPHSABS 1.7 01/28/2017 0951   MONOABS 0.7 03/14/2016 1041   EOSABS 0.0 01/28/2017 0951   BASOSABS 0.0 01/28/2017 0951   Iron/TIBC/Ferritin/ %Sat No results found for: IRON, TIBC, FERRITIN, IRONPCTSAT Lipid Panel     Component Value Date/Time   CHOL 233 (H) 01/28/2017 0951   TRIG 99 01/28/2017 0951   HDL 53 01/28/2017 0951   CHOLHDL 4 09/18/2015 0743   VLDL 12.6 09/18/2015 0743   LDLCALC 160 (H) 01/28/2017 0951   LDLDIRECT 153.4 12/10/2012 1518   Hepatic Function Panel     Component Value Date/Time   PROT 7.1 01/28/2017 0951   ALBUMIN 4.2 01/28/2017 0951   AST 14 01/28/2017 0951   ALT 18 01/28/2017 0951   ALKPHOS 55 01/28/2017 0951   BILITOT 0.3 01/28/2017 0951       Component Value Date/Time   TSH 0.748 01/28/2017 0951   TSH 0.92 09/18/2015 0743   TSH 0.84 12/01/2014 0807    ASSESSMENT AND PLAN: Prediabetes - Plan: metFORMIN (GLUCOPHAGE) 500 MG tablet  Other depression - With emotional eating - Plan: buPROPion (WELLBUTRIN SR) 150 MG 12 hr tablet  Vitamin D deficiency - Plan: Vitamin D, Ergocalciferol, (DRISDOL) 50000 units CAPS capsule  At risk for diabetes mellitus  Class 2 obesity with serious comorbidity and body mass index (BMI) of 35.0 to 35.9 in adult, unspecified obesity type  PLAN:  Pre-Diabetes Brenda Williams will continue to work on weight loss, exercise, and decreasing simple carbohydrates in her diet to help decrease the risk of diabetes. We dicussed metformin including benefits and risks. She was informed that eating too many simple carbohydrates or too many calories at one sitting increases the likelihood of GI side effects. Brenda Williams agrees to continue taking metformin for now and a prescription was written today for 1 month refill. Brenda Williams agrees to follow up with our clinic in 2 to 3 weeks as directed to monitor her progress.  Diabetes risk counselling Brenda Williams was given extended (15 minutes) diabetes prevention counseling today. She is 36 y.o. female and has risk factors for diabetes including obesity. We discussed intensive lifestyle modifications today with an emphasis on weight loss as well as increasing exercise and decreasing simple carbohydrates in her diet.  Vitamin D Deficiency Brenda Williams was informed that low vitamin D levels contributes to fatigue and are associated with obesity, breast, and colon cancer. She agrees to continue taking prescription Vit D @50 ,000 IU every week and will follow up for routine testing of vitamin D, at least 2-3 times per year. She was informed of the risk of over-replacement of vitamin D and agrees to not increase her dose unless he  discusses this with [place] first. We will refill Vit D for 1 month and Eleny  agrees to follow up with our clinic in 2 to 3 weeks.  Depression with Emotional Eating Behaviors We discussed behavior modification techniques today to help Brenda Williams deal with her emotional eating and depression. She has agreed to start taking Wellbutrin SR 150 mg q AM #30 with no refills and will follow up with our clinic in 2 to 3 weeks.  Obesity Brenda Williams is currently in the action stage of change. As such, her goal is to continue with weight loss efforts She has agreed to follow the Category 2 plan Brenda Williams has been instructed to work up to a goal of 150 minutes of combined cardio and strengthening exercise per week for weight loss and overall health benefits. We discussed the following Behavioral Modification Strategies today: increasing lean protein intake, decreasing simple carbohydrates, work on meal planning and easy cooking plans, keeping healthy foods in the home, and planning for success.   Brenda Williams has agreed to follow up with our clinic in 2 to 3 weeks. She was informed of the importance of frequent follow up visits to maximize her success with intensive lifestyle modifications for her multiple health conditions.  I, Trixie Dredge, am acting as transcriptionist for Brenda Nip, MD  I have reviewed the above documentation for accuracy and completeness, and I agree with the above. -Brenda Nip, MD     OBESITY BEHAVIORAL INTERVENTION VISIT  Today's visit was # 4 out of 22.  Starting weight: 236 lbs Starting date: 01/28/17 Today's weight: 228 lbs  Today's date: 03/17/17 Total lbs lost to date: 8 (Patients must lose 7 lbs in the first 6 months to continue with counseling)   ASK: We discussed the diagnosis of obesity with Brenda Williams today and Brenda Williams agreed to give [place] permission to discuss obesity behavioral modification therapy today.  ASSESS: Brenda Williams has the diagnosis of obesity and her BMI today is 6 Brenda Williams is in the action stage of change   ADVISE: Brenda Williams  was educated on the multiple health risks of obesity as well as the benefit of weight loss to improve her health. She was advised of the need for long term treatment and the importance of lifestyle modifications.  AGREE: Multiple dietary modification options and treatment options were discussed and  Brenda Williams agreed to follow the Category 2 plan We discussed the following Behavioral Modification Strategies today: increasing lean protein intake, decreasing simple carbohydrates, work on meal planning and easy cooking plans, keeping healthy foods in the home, and planning for success.

## 2017-04-01 ENCOUNTER — Encounter: Payer: Self-pay | Admitting: Emergency Medicine

## 2017-04-06 ENCOUNTER — Encounter (INDEPENDENT_AMBULATORY_CARE_PROVIDER_SITE_OTHER): Payer: Self-pay

## 2017-04-06 ENCOUNTER — Ambulatory Visit (INDEPENDENT_AMBULATORY_CARE_PROVIDER_SITE_OTHER): Payer: 59 | Admitting: Physician Assistant

## 2017-04-07 ENCOUNTER — Encounter: Payer: Self-pay | Admitting: Emergency Medicine

## 2017-05-26 ENCOUNTER — Other Ambulatory Visit (INDEPENDENT_AMBULATORY_CARE_PROVIDER_SITE_OTHER): Payer: 59

## 2017-05-26 ENCOUNTER — Ambulatory Visit (INDEPENDENT_AMBULATORY_CARE_PROVIDER_SITE_OTHER): Payer: 59 | Admitting: Emergency Medicine

## 2017-05-26 ENCOUNTER — Encounter: Payer: Self-pay | Admitting: Emergency Medicine

## 2017-05-26 DIAGNOSIS — J8481 Lymphangioleiomyomatosis: Secondary | ICD-10-CM

## 2017-05-26 DIAGNOSIS — R9389 Abnormal findings on diagnostic imaging of other specified body structures: Secondary | ICD-10-CM

## 2017-05-26 DIAGNOSIS — J849 Interstitial pulmonary disease, unspecified: Secondary | ICD-10-CM | POA: Diagnosis not present

## 2017-05-26 LAB — CBC WITH DIFFERENTIAL/PLATELET
BASOS ABS: 0 10*3/uL (ref 0.0–0.1)
Basophils Relative: 0.4 % (ref 0.0–3.0)
EOS ABS: 0 10*3/uL (ref 0.0–0.7)
Eosinophils Relative: 0.6 % (ref 0.0–5.0)
HCT: 41.5 % (ref 36.0–46.0)
Hemoglobin: 13.3 g/dL (ref 12.0–15.0)
LYMPHS ABS: 1.6 10*3/uL (ref 0.7–4.0)
Lymphocytes Relative: 22.9 % (ref 12.0–46.0)
MCHC: 32.2 g/dL (ref 30.0–36.0)
MCV: 90 fl (ref 78.0–100.0)
MONO ABS: 0.3 10*3/uL (ref 0.1–1.0)
MONOS PCT: 4.3 % (ref 3.0–12.0)
NEUTROS ABS: 5 10*3/uL (ref 1.4–7.7)
NEUTROS PCT: 71.8 % (ref 43.0–77.0)
PLATELETS: 323 10*3/uL (ref 150.0–400.0)
RBC: 4.6 Mil/uL (ref 3.87–5.11)
RDW: 15 % (ref 11.5–15.5)
WBC: 6.9 10*3/uL (ref 4.0–10.5)

## 2017-05-26 LAB — COMPREHENSIVE METABOLIC PANEL
ALK PHOS: 49 U/L (ref 39–117)
ALT: 10 U/L (ref 0–35)
AST: 10 U/L (ref 0–37)
Albumin: 4.2 g/dL (ref 3.5–5.2)
BILIRUBIN TOTAL: 0.4 mg/dL (ref 0.2–1.2)
BUN: 13 mg/dL (ref 6–23)
CO2: 28 meq/L (ref 19–32)
CREATININE: 0.71 mg/dL (ref 0.40–1.20)
Calcium: 9.4 mg/dL (ref 8.4–10.5)
Chloride: 107 mEq/L (ref 96–112)
GFR: 118.5 mL/min (ref >60.00)
GLUCOSE: 101 mg/dL — AB (ref 70–99)
Potassium: 3.8 mEq/L (ref 3.5–5.1)
SODIUM: 140 meq/L (ref 135–145)
TOTAL PROTEIN: 7.4 g/dL (ref 6.0–8.3)

## 2017-05-26 MED ORDER — SIROLIMUS 1 MG PO TABS: 1.0000 mg | ORAL_TABLET | Freq: Every day | ORAL | 1 refills | Status: DC

## 2017-05-26 NOTE — Patient Instructions (Signed)
Lab work today We will arrange for a CT scan of the chest, no contrast high-resolution cuts to follow pulmonary cysts Continue sirolimus 1 mg daily as you have been taking it. Follow with Dr Lamonte Sakai after your CT scan is completed to review the results together

## 2017-05-26 NOTE — Assessment & Plan Note (Signed)
With associated cystic lung disease.  She has been marginally reliable with her sirolimus.  She is currently on it and has been on it for several weeks.  This is a good time to check her CBC, LFT, BMP, sirolimus level.  She needs a repeat CT scan of the chest to evaluate for stability of her cystic lung disease.  Sirolimus is a suppressive medication not a curative medication.  Lab work today We will arrange for a CT scan of the chest, no contrast high-resolution cuts to follow pulmonary cysts Continue sirolimus 1 mg daily as you have been taking it. Follow with Dr Lamonte Sakai after your CT scan is completed to review the results together

## 2017-05-26 NOTE — Assessment & Plan Note (Signed)
CT scan as above

## 2017-05-26 NOTE — Assessment & Plan Note (Signed)
Cystic lung disease, tissue biopsy consistent with LAM. Also with nodular disease that will need to be followed with serial films.

## 2017-05-26 NOTE — Progress Notes (Signed)
Subjective:    Patient ID: Brenda Williams, female    DOB: 1981-06-15, 36 y.o.   MRN: [id number]  HPI 36 yo woman, never smoker, little PMH. Referred for abnormal CT scan characterized by cystic changes, a single stable RML nodule. Original CT was in 3/'14 after she had anesthesia trouble following d&c. Denies dyspnea except when lying supine. No CP, no cough.    ROV 10/16/15 -- patient with a history of diffuse multilobar microcystic disease on CT scan of the chest. As part of her evaluation would perform pulmonary function testing today that I have personally reviewed. This showed normal spirometry, slightly decreased total lung capacity consistent with mild restriction and a decreased diffusion capacity that corrects to the normal range when adjusted for alveolar volume. She has some episodes occasionally when she "breathes hard". Denies any chest pain. She has not had cough, no hemoptysis. She has gained some weight since last visit, has been less active. She is working on getting back in shape. Her VEGF level was normal 06/2015.   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  She feels that her breathing may be a bit worse with exertion. She has noted the needs to pace herself. She can shop without resting, but quite fatigued after. She does not has cough, no hemoptysis. PFT's today reviewed by me > progressive obstruction, now with a BD response. No hemoptysis, no cough.   ROV 05/26/17 --36 year old woman with polycystic lung disease and a tissue diagnosis of lymphangioleiomyomatosis she has been on sirolimus since April. She has had a stuttering course, starting and stopping it. Current dose 1mg  daily.  She has been doing fairly well since last time, but she is having some nausea, fatigue. Her breathing may be a bit worse than last visit. She isn't exercising very much - tore a ligament in her R knee. Last time her labs  were checked was 01/2017.    Review of Systems As per HPI     Objective:   Physical Exam Vitals:   05/26/17 0853  BP: 136/82  Pulse: 74  SpO2: 95%  Weight: 231 lb (104.8 kg)  Height: 5\' 7"  (1.702 m)   Body mass index is 36.18 kg/m.  Gen: Pleasant, well-nourished, in no distress,  normal affect  ENT: No lesions,  mouth clear,  oropharynx clear, no postnasal drip  Neck: No JVD, no TMG, no carotid bruits  Lungs: No use of accessory muscles, distant, clear without rales or rhonchi  Cardiovascular: RRR, heart sounds normal, no murmur or gallops, no peripheral edema  Musculoskeletal: No deformities, no cyanosis or clubbing  Neuro: alert, non focal  Skin: Warm, no lesions or rashes      06/19/16 --  FINDINGS: Cardiovascular: Heart size is mildly enlarged. There is no significant pericardial fluid, thickening or pericardial calcification. No atherosclerotic calcifications are noted in the thoracic aorta or the coronary arteries.  Mediastinum/Nodes: No pathologically enlarged mediastinal or hilar lymph nodes. Please note that accurate exclusion of hilar adenopathy is limited on noncontrast CT scans. Esophagus is unremarkable in appearance. No axillary lymphadenopathy.  Lungs/Pleura: Previously noted right middle lobe pulmonary nodule has increased in size, currently measuring 10 x 8 x 8 mm (axial image 61 of series 5 and coronal image 59 of series 8) as compared with 8 x 6 x 6 mm on prior study 06/28/2015. This slow growth of this nodule and generally benign appearance favors a benign diagnosis such  as a hamartoma. No other suspicious appearing pulmonary nodules or masses are noted. Postoperative changes of wedge resection in the periphery of the right upper lobe. No acute consolidative airspace disease. No pleural effusions. Inspiratory and expiratory imaging is unremarkable.  Upper Abdomen: Unremarkable.  Musculoskeletal: There are no aggressive appearing  lytic or blastic lesions noted in the visualized portions of the skeleton.  IMPRESSION: 1. 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 2. 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   Assessment & Plan:    Lymphangioleiomyomatosis [facility]) With associated cystic lung disease.  She has been marginally reliable with her sirolimus.  She is currently on it and has been on it for several weeks.  This is a good time to check her CBC, LFT, BMP, sirolimus level.  She needs a repeat CT scan of the chest to evaluate for stability of her cystic lung disease.  Sirolimus is a suppressive medication not a curative medication.  Lab work today We will arrange for a CT scan of the chest, no contrast high-resolution cuts to follow pulmonary cysts Continue sirolimus 1 mg daily as you have been taking it. Follow with Dr Lamonte Sakai after your CT scan is completed to review the results together  Abnormal CT scan, chest Cystic lung disease, tissue biopsy consistent with LAM. Also with nodular disease that will need to be followed with serial films.  Solitary pulmonary nodule CT scan as above  Baltazar Apo, MD, PhD 05/26/2017, 9:27 AM [place] [facility] [phone] or if no answer [phone]

## 2017-05-29 ENCOUNTER — Telehealth: Payer: Self-pay | Admitting: Emergency Medicine

## 2017-05-29 LAB — SIROLIMUS LEVEL: Sirolimus (Rapamycin): 1 mcg/L — CL (ref 3.0–18.0)

## 2017-05-29 NOTE — Telephone Encounter (Signed)
I received a call from [place] at [facility] regarding critical lab result as follows: Sirolimus level = 1.0 Results are in [employer]

## 2017-06-03 ENCOUNTER — Ambulatory Visit (INDEPENDENT_AMBULATORY_CARE_PROVIDER_SITE_OTHER)
Admission: RE | Admit: 2017-06-03 | Discharge: 2017-06-03 | Disposition: A | Discharge status: Home / Self Care | Payer: 59 | Source: Ambulatory Visit | Attending: Emergency Medicine | Admitting: Emergency Medicine

## 2017-06-03 DIAGNOSIS — J849 Interstitial pulmonary disease, unspecified: Secondary | ICD-10-CM

## 2017-06-03 DIAGNOSIS — R0602 Shortness of breath: Secondary | ICD-10-CM | POA: Diagnosis not present

## 2017-06-09 NOTE — Telephone Encounter (Signed)
Sirolimus level is low but current recommendations are to maintain 1 mg daily as long as we have adequate clinical response.  I will need to speak with her at her next visit regarding any clinical indication to increase her dose to 2 mg.

## 2017-06-12 ENCOUNTER — Encounter: Payer: Self-pay | Admitting: Emergency Medicine

## 2017-06-12 ENCOUNTER — Ambulatory Visit: Payer: 59 | Admitting: Emergency Medicine

## 2017-06-12 DIAGNOSIS — R911 Solitary pulmonary nodule: Secondary | ICD-10-CM | POA: Diagnosis not present

## 2017-06-12 DIAGNOSIS — J4520 Mild intermittent asthma, uncomplicated: Secondary | ICD-10-CM | POA: Diagnosis not present

## 2017-06-12 MED ORDER — ALBUTEROL SULFATE HFA 108 (90 BASE) MCG/ACT IN AERS: 2.0000 | INHALATION_SPRAY | RESPIRATORY_TRACT | 5 refills | Status: DC | PRN

## 2017-06-12 NOTE — Progress Notes (Signed)
Subjective:    Patient ID: Brenda Williams, female    DOB: 22-Dec-1980, 36 y.o.   MRN: [id number]  HPI 36 yo woman, never smoker, little PMH. Referred for abnormal CT scan characterized by cystic changes, a single stable RML nodule.  Has undergone VATS biopsy, tissue diagnosis of lymphangioleiomyomatosis.  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  She also has obstructive lung disease on pulmonary function testing.  We started her on sirolimus in April 2018 but she has had some difficulty staying on the medication.  We checked labs 05/26/17 that showed a normal CBC, normal CMP.  Her sirolimus level was low at 1.0 (therapeutic range 3.0-18.0 mcg/L).  We repeated a CT scan of the chest on 06/03/17 that I have reviewed.  This shows stable cystic changes, a stable 8 x 10 mm right middle lobe nodule.   She is feeling better. Minimal dyspnea, good exercise tolerance.   Review of Systems As per HPI     Objective:   Physical Exam Vitals:   06/12/17 0936 06/12/17 0937  BP:  (!) 148/100  Pulse:  76  SpO2:  95%  Weight: 231 lb (104.8 kg)   Height: 5\' 7"  (1.702 m)    Body mass index is 36.18 kg/m.  Gen: Pleasant, well-nourished, in no distress,  normal affect  ENT: No lesions,  mouth clear,  oropharynx clear, no postnasal drip  Neck: No JVD, no TMG, no carotid bruits  Lungs: No use of accessory muscles, distant, clear without rales or rhonchi  Cardiovascular: RRR, heart sounds normal, no murmur or gallops, no peripheral edema  Musculoskeletal: No deformities, no cyanosis or clubbing  Neuro: alert, non focal  Skin: Warm, no lesions or rashes    CT Chest 06/03/17 --  COMPARISON:  Prior examinations dating back to 06/30/2014.  FINDINGS: Cardiovascular: Vascular structures are unremarkable. Heart size within normal limits. No pericardial effusion.  Mediastinum/Nodes: No pathologically enlarged  mediastinal or axillary lymph nodes. Hilar regions are difficult to definitively evaluate without IV contrast. Esophagus is grossly unremarkable.  Lungs/Pleura: Cystic changes are seen throughout the lungs. Mildly lobulated nodule in the right middle lobe measures 8 x 10 mm, stable from prior exams when differences in slice collimation are considered. Postoperative scarring in the right hemithorax. No pleural fluid. Airway is unremarkable.  Upper Abdomen: Visualized portions of the liver, adrenal glands, kidneys, spleen, pancreas, stomach and bowel are grossly unremarkable.  Musculoskeletal: Degenerative changes in the spine. No worrisome lytic or sclerotic lesions.  IMPRESSION: 1. Cystic lung disease, consistent with the known diagnosis of lymphangioleiomyomatosis. 2. Right middle lobe nodule, stable and most likely benign   Assessment & Plan:    Solitary pulmonary nodule Stable by repeat CT scan 05/26/17.  She needs a follow-up in 1 year.  She may end up with a CT scan sooner as we are also following her cystic disease from her LAM  Lymphangioleiomyomatosis ([place]) Currently on sirolimus 1 mg daily.  As discussed her level was 1.0, below the therapeutic range.  I reviewed the literature suggests that this is likely acceptable as long as she is clinically stable, radiographically stable.  I will keep her at the same dose, follow levels and screen her CBC, CMP.  I will also check a VEG F level at her next visit.  We will determine the timing of a repeat CT scan depending on her clinical stability.  At the latest I will likely repeat the CT and  PFT in approximately 1 year.  Mild intermittent asthma Based on spirometry with a significant bronchodilator response.  She did not benefit from Symbicort and is now off this medication.  I would like for her to have an albuterol HFA to use as needed.  We discussed the technique today  Baltazar Apo, MD, PhD 06/12/2017, 10:02 AM [place]  [facility] [phone] or if no answer [phone]

## 2017-06-12 NOTE — Assessment & Plan Note (Signed)
Stable by repeat CT scan 05/26/17.  She needs a follow-up in 1 year.  She may end up with a CT scan sooner as we are also following her cystic disease from her LAM

## 2017-06-12 NOTE — Assessment & Plan Note (Signed)
Based on spirometry with a significant bronchodilator response.  She did not benefit from Symbicort and is now off this medication.  I would like for her to have an albuterol HFA to use as needed.  We discussed the technique today

## 2017-06-12 NOTE — Patient Instructions (Signed)
Please continue sirolimus 1 mg once a day We will check your blood work including sirolimus level in 3 months at your next visit We will determine the timing of your next CT scan of the chest based on how you are feeling We will order an albuterol inhaler.  Please use 2 puffs if needed for shortness of breath, wheezing, chest tightness Follow with Dr Lamonte Sakai in 3 months or sooner if you have any problems.

## 2017-06-12 NOTE — Assessment & Plan Note (Addendum)
Currently on sirolimus 1 mg daily.  As discussed her level was 1.0, below the therapeutic range.  I reviewed the literature suggests that this is likely acceptable as long as she is clinically stable, radiographically stable.  I will keep her at the same dose, follow levels and screen her CBC, CMP.  I will also check a VEG F level at her next visit.  We will determine the timing of a repeat CT scan depending on her clinical stability.  At the latest I will likely repeat the CT and PFT in approximately 1 year.

## 2017-07-23 ENCOUNTER — Telehealth: Payer: Self-pay | Admitting: Emergency Medicine

## 2017-07-23 ENCOUNTER — Encounter: Payer: Self-pay | Admitting: Emergency Medicine

## 2017-07-23 DIAGNOSIS — R0789 Other chest pain: Secondary | ICD-10-CM

## 2017-07-23 NOTE — Telephone Encounter (Signed)
Called and spoke with pt.  Pt states she is experiencing chest pressure, increased sob & prod cough with clear mucus since this morning.  Denies additional symptoms.  Pt is questioning if she should be concerned about symptoms.   RB please advise. Thanks.   Allergies  Allergen Reactions  . Fentanyl Itching    Benadryl effective for itching.

## 2017-07-23 NOTE — Telephone Encounter (Signed)
Okay for handicap placard per RB Filled out and signed  Called spoke with patient, she will pick up handicap placard Placard placed up front Nothing further needed; will sign off

## 2017-07-23 NOTE — Telephone Encounter (Signed)
I am concerned about the chest pressure given her history of LAM.  Does put patients at increased risk for pneumothorax.  I would like for her to have a chest x-ray here tomorrow.  If her breathing worsens in any way before tomorrow, if she develops chest pain, then she is going to need to go to the emergency department to get a chest x-ray sooner.

## 2017-07-23 NOTE — Telephone Encounter (Signed)
Pt is aware of below message and voiced her understanding.  CXR has been ordered.  Nothing further is needed.

## 2017-07-24 ENCOUNTER — Ambulatory Visit (INDEPENDENT_AMBULATORY_CARE_PROVIDER_SITE_OTHER)
Admission: RE | Admit: 2017-07-24 | Discharge: 2017-07-24 | Disposition: A | Discharge status: Home / Self Care | Payer: 59 | Source: Ambulatory Visit | Attending: Emergency Medicine | Admitting: Emergency Medicine

## 2017-07-24 ENCOUNTER — Telehealth: Payer: Self-pay | Admitting: Pharmacy Technician

## 2017-07-24 DIAGNOSIS — R0789 Other chest pain: Secondary | ICD-10-CM | POA: Diagnosis not present

## 2017-07-24 NOTE — Telephone Encounter (Signed)
No pneumothorax - there are some mild changes in the lung which are typical of her dx and may be slightly more pronounced but there's no quick fix for this and Dr Lamonte Sakai will need to sort out what to do when he reviews - so nothing to do differently for now

## 2017-07-24 NOTE — Telephone Encounter (Signed)
Spoke with pt, aware of recs.  Offered pt an appt with NP next week for further eval, but pt prefers RB's recs over the phone first.    RB please advise.  Thanks!

## 2017-07-24 NOTE — Telephone Encounter (Signed)
Pt called in yesterday regarding a sick message and was advised to get a cxr which was obtained this morning.  Pt requesting cxr results and additional recs.    Sending to DOD as RB is off this afternoon.  MW please advise.  Thanks!   (RB's recs from yesterday's phone note) Collene Gobble, MD  to [facility] Triage Pool      07/23/17 4:46 PM  Note    I am concerned about the chest pressure given her history of LAM.  Does put patients at increased risk for pneumothorax.  I would like for her to have a chest x-ray here tomorrow.  If her breathing worsens in any way before tomorrow, if she develops chest pain, then she is going to need to go to the emergency department to get a chest x-ray sooner.

## 2017-07-30 NOTE — Telephone Encounter (Signed)
RB please advise. Thanks.  

## 2017-08-04 NOTE — Telephone Encounter (Signed)
RB please advise, thanks. 

## 2017-08-06 NOTE — Telephone Encounter (Signed)
I called the patient and left a message.  At this point I believe we need to set up an office visit so that we can review her medications, lab work, assess whether her current dosing is adequate.  Please set her up for an office visit thank you

## 2017-08-06 NOTE — Telephone Encounter (Signed)
lmtcb for pt. Pt needs OV with RB. Held spot on RB's schedule for pt on 1.25.19 at 3:15pm.

## 2017-08-07 ENCOUNTER — Encounter: Payer: Self-pay | Admitting: Emergency Medicine

## 2017-08-07 ENCOUNTER — Other Ambulatory Visit (INDEPENDENT_AMBULATORY_CARE_PROVIDER_SITE_OTHER): Payer: 59

## 2017-08-07 ENCOUNTER — Ambulatory Visit (INDEPENDENT_AMBULATORY_CARE_PROVIDER_SITE_OTHER): Payer: 59 | Admitting: Emergency Medicine

## 2017-08-07 VITALS — BP 118/72 | HR 75 | Ht 67.0 in | Wt 238.0 lb

## 2017-08-07 DIAGNOSIS — J8481 Lymphangioleiomyomatosis: Secondary | ICD-10-CM

## 2017-08-07 LAB — COMPREHENSIVE METABOLIC PANEL
ALT: 12 U/L (ref 0–35)
AST: 11 U/L (ref 0–37)
Albumin: 4 g/dL (ref 3.5–5.2)
Alkaline Phosphatase: 51 U/L (ref 39–117)
BUN: 11 mg/dL (ref 6–23)
CHLORIDE: 104 meq/L (ref 96–112)
CO2: 28 meq/L (ref 19–32)
Calcium: 9.2 mg/dL (ref 8.4–10.5)
Creatinine, Ser: 0.73 mg/dL (ref 0.40–1.20)
GFR: 114.64 mL/min (ref >60.00)
GLUCOSE: 94 mg/dL (ref 70–99)
POTASSIUM: 3.4 meq/L — AB (ref 3.5–5.1)
SODIUM: 139 meq/L (ref 135–145)
Total Bilirubin: 0.3 mg/dL (ref 0.2–1.2)
Total Protein: 7.2 g/dL (ref 6.0–8.3)

## 2017-08-07 LAB — CBC WITH DIFFERENTIAL/PLATELET
BASOS ABS: 0 10*3/uL (ref 0.0–0.1)
Basophils Relative: 0.6 % (ref 0.0–3.0)
EOS PCT: 0.7 % (ref 0.0–5.0)
Eosinophils Absolute: 0 10*3/uL (ref 0.0–0.7)
HCT: 39.7 % (ref 36.0–46.0)
Hemoglobin: 13 g/dL (ref 12.0–15.0)
LYMPHS ABS: 2.1 10*3/uL (ref 0.7–4.0)
Lymphocytes Relative: 35.1 % (ref 12.0–46.0)
MCHC: 32.7 g/dL (ref 30.0–36.0)
MCV: 89 fl (ref 78.0–100.0)
MONO ABS: 0.3 10*3/uL (ref 0.1–1.0)
MONOS PCT: 5 % (ref 3.0–12.0)
NEUTROS ABS: 3.4 10*3/uL (ref 1.4–7.7)
Neutrophils Relative %: 58.6 % (ref 43.0–77.0)
PLATELETS: 333 10*3/uL (ref 150.0–400.0)
RBC: 4.46 Mil/uL (ref 3.87–5.11)
RDW: 15.2 % (ref 11.5–15.5)
WBC: 5.9 10*3/uL (ref 4.0–10.5)

## 2017-08-07 MED ORDER — BUDESONIDE-FORMOTEROL FUMARATE 160-4.5 MCG/ACT IN AERO: 2.0000 | INHALATION_SPRAY | Freq: Two times a day (BID) | RESPIRATORY_TRACT | 6 refills | Status: DC

## 2017-08-07 NOTE — Patient Instructions (Addendum)
We will check your lab work today We will restart Symbicort 2 puffs twice a day. Rinse and gargle after using.  Take albuterol 2 puffs up to every 4 hours if needed for shortness of breath.  Please follow-up in 1 month.  At that time we will review your status on the inhaled medication, review your labs and decide whether we need to change your sirolimus dosing.

## 2017-08-07 NOTE — Telephone Encounter (Signed)
Spoke with pt, made an appt with RB today at 3:15. Nothing further is needed.

## 2017-08-07 NOTE — Assessment & Plan Note (Signed)
We will check your lab work today Please follow-up in 1 month.  At that time we will review your status on the inhaled medication, review your labs and decide whether we need to change your sirolimus dosing.

## 2017-08-07 NOTE — Progress Notes (Signed)
Subjective:    Patient ID: Brenda Williams, female    DOB: [birth year], 37 y.o.   MRN: [id number]  HPI 37 yo woman, never smoker, little PMH. Referred for abnormal CT scan characterized by cystic changes, a single stable RML nodule.  Has undergone VATS biopsy, tissue diagnosis of lymphangioleiomyomatosis.  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  She also has obstructive lung disease on pulmonary function testing.  We started her on sirolimus in April 2018 but she has had some difficulty staying on the medication.  We checked labs 05/26/17 that showed a normal CBC, normal CMP.  Her sirolimus level was low at 1.0 (therapeutic range 3.0-18.0 mcg/L).  We repeated a CT scan of the chest on 06/03/17 that I have reviewed.  This shows stable cystic changes, a stable 8 x 10 mm right middle lobe nodule.   She is feeling better. Minimal dyspnea, good exercise tolerance.   ROV 08/07/17 --this is a follow-up visit for 37 year old woman with LAM and associated thin-walled polycystic lung disease.  She has obstructive lung disease by pulmonary function testing.  She is currently on sirolimus 1 mg daily. She had an episode of CP 2 weeks ago > CXR no evidence PTX. Resolved on its own. She has been more SOB w exertion. She has benefited from her inhaled medication, albuterol.   Review of Systems As per HPI     Objective:   Physical Exam Vitals:   08/07/17 1537  BP: 118/72  Pulse: 75  SpO2: 98%  Weight: 238 lb (108 kg)  Height: 5\' 7"  (1.702 m)   Body mass index is 37.28 kg/m.  Gen: Pleasant, well-nourished, in no distress,  normal affect  ENT: No lesions,  mouth clear,  oropharynx clear, no postnasal drip  Neck: No JVD, no TMG, no carotid bruits  Lungs: No use of accessory muscles, distant, clear without rales or rhonchi  Cardiovascular: RRR, heart sounds normal, no murmur or gallops, no peripheral  edema  Musculoskeletal: No deformities, no cyanosis or clubbing  Neuro: alert, non focal  Skin: Warm, no lesions or rashes    CT Chest 06/03/17 --  COMPARISON:  Prior examinations dating back to 06/30/2014.  FINDINGS: Cardiovascular: Vascular structures are unremarkable. Heart size within normal limits. No pericardial effusion.  Mediastinum/Nodes: No pathologically enlarged mediastinal or axillary lymph nodes. Hilar regions are difficult to definitively evaluate without IV contrast. Esophagus is grossly unremarkable.  Lungs/Pleura: Cystic changes are seen throughout the lungs. Mildly lobulated nodule in the right middle lobe measures 8 x 10 mm, stable from prior exams when differences in slice collimation are considered. Postoperative scarring in the right hemithorax. No pleural fluid. Airway is unremarkable.  Upper Abdomen: Visualized portions of the liver, adrenal glands, kidneys, spleen, pancreas, stomach and bowel are grossly unremarkable.  Musculoskeletal: Degenerative changes in the spine. No worrisome lytic or sclerotic lesions.  IMPRESSION: 1. Cystic lung disease, consistent with the known diagnosis of lymphangioleiomyomatosis. 2. Right middle lobe nodule, stable and most likely benign   Assessment & Plan:    Lymphangioleiomyomatosis [facility]) We will check your lab work today Please follow-up in 1 month.  At that time we will review your status on the inhaled medication, review your labs and decide whether we need to change your sirolimus dosing.  Mild intermittent asthma We will restart Symbicort 2 puffs twice a day. Rinse and gargle after using.  Take albuterol 2 puffs up to every 4 hours if needed  for shortness of breath.    Baltazar Apo, MD, PhD 08/07/2017, 3:54 PM [place] [facility] [phone] or if no answer [phone]

## 2017-08-07 NOTE — Assessment & Plan Note (Signed)
We will restart Symbicort 2 puffs twice a day. Rinse and gargle after using.  Take albuterol 2 puffs up to every 4 hours if needed for shortness of breath.

## 2017-08-10 LAB — SIROLIMUS LEVEL

## 2017-09-08 ENCOUNTER — Ambulatory Visit: Payer: 59 | Admitting: Emergency Medicine

## 2017-09-08 ENCOUNTER — Encounter: Payer: Self-pay | Admitting: Emergency Medicine

## 2017-09-08 VITALS — BP 122/78 | HR 74 | Ht 67.0 in | Wt 232.0 lb

## 2017-09-08 DIAGNOSIS — J8481 Lymphangioleiomyomatosis: Secondary | ICD-10-CM | POA: Diagnosis not present

## 2017-09-08 DIAGNOSIS — R400 Somnolence: Secondary | ICD-10-CM

## 2017-09-08 DIAGNOSIS — J4520 Mild intermittent asthma, uncomplicated: Secondary | ICD-10-CM

## 2017-09-08 DIAGNOSIS — R5382 Chronic fatigue, unspecified: Secondary | ICD-10-CM

## 2017-09-08 DIAGNOSIS — G4730 Sleep apnea, unspecified: Secondary | ICD-10-CM

## 2017-09-08 NOTE — Patient Instructions (Addendum)
We will continue your sirolimus at your current dose for now.  We will hold off on any inhaled medication at this time.  We will repeat your PFT at your next visit to compare with your priors.  We will arrange for a split night sleep study to evaluate for sleep apnea.  Follow with Dr Lamonte Sakai after your sleep testing is done to review. We will perform PFT on the same day.

## 2017-09-08 NOTE — Progress Notes (Signed)
Subjective:    Patient ID: Brenda Williams, female    DOB: 04/04/1981, 37 y.o.   MRN: [id number]  HPI 37 yo woman, never smoker, little PMH. Referred for abnormal CT scan characterized by cystic changes, a single stable RML nodule.  Has undergone VATS biopsy, tissue diagnosis of lymphangioleiomyomatosis.  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  She also has obstructive lung disease on pulmonary function testing.  We started her on sirolimus in April 2018 but she has had some difficulty staying on the medication.  We checked labs 05/26/17 that showed a normal CBC, normal CMP.  Her sirolimus level was low at 1.0 (therapeutic range 3.0-18.0 mcg/L).  We repeated a CT scan of the chest on 06/03/17 that I have reviewed.  This shows stable cystic changes, a stable 8 x 10 mm right middle lobe nodule.   She is feeling better. Minimal dyspnea, good exercise tolerance.   ROV 08/07/17 --this is a follow-up visit for 37 year old woman with LAM and associated thin-walled polycystic lung disease.  She has obstructive lung disease by pulmonary function testing.  She is currently on sirolimus 1 mg daily. She had an episode of CP 2 weeks ago > CXR no evidence PTX. Resolved on its own. She has been more SOB w exertion. She has benefited from her inhaled medication, albuterol.   ROV 09/08/17 --this is a follow-up visit for 37 year old woman with LAM, associated obstructive lung disease and polycystic lung disease.  At her last visit we restarted Symbicort.  I also rechecked her sirolimus level which was again less than 1.0. She notes that the Symbicort caused tachycardia, unclear that it helped her breathing. She continues to have some exertional SOB. She does not use albuterol. She c/o sleepiness. She has some snoring.   Review of Systems As per HPI     Objective:   Physical Exam Vitals:   09/08/17 1207  BP: 122/78  Pulse: 74    SpO2: 96%  Weight: 105.2 kg  Height: 5\' 7"  (1.702 m)   Body mass index is 36.34 kg/m.  Gen: Pleasant, well-nourished, in no distress,  normal affect  ENT: No lesions,  mouth clear,  oropharynx clear, no postnasal drip  Neck: No JVD, no TMG, no carotid bruits  Lungs: No use of accessory muscles, distant, clear without rales or rhonchi  Cardiovascular: RRR, heart sounds normal, no murmur or gallops, no peripheral edema  Musculoskeletal: No deformities, no cyanosis or clubbing  Neuro: alert, non focal  Skin: Warm, no lesions or rashes    CT Chest 06/03/17 --  COMPARISON:  Prior examinations dating back to 06/30/2014.  FINDINGS: Cardiovascular: Vascular structures are unremarkable. Heart size within normal limits. No pericardial effusion.  Mediastinum/Nodes: No pathologically enlarged mediastinal or axillary lymph nodes. Hilar regions are difficult to definitively evaluate without IV contrast. Esophagus is grossly unremarkable.  Lungs/Pleura: Cystic changes are seen throughout the lungs. Mildly lobulated nodule in the right middle lobe measures 8 x 10 mm, stable from prior exams when differences in slice collimation are considered. Postoperative scarring in the right hemithorax. No pleural fluid. Airway is unremarkable.  Upper Abdomen: Visualized portions of the liver, adrenal glands, kidneys, spleen, pancreas, stomach and bowel are grossly unremarkable.  Musculoskeletal: Degenerative changes in the spine. No worrisome lytic or sclerotic lesions.  IMPRESSION: 1. Cystic lung disease, consistent with the known diagnosis of lymphangioleiomyomatosis. 2. Right middle lobe nodule, stable and most likely benign   Assessment &  Plan:    Lymphangioleiomyomatosis [facility]) We will continue your sirolimus at your current dose for now.  We will repeat your PFT at your next visit to compare with your priors.    Mild intermittent asthma . We will hold off on any  inhaled medication at this time.  We will repeat your PFT at your next visit to compare with your priors.   Chronic fatigue We will arrange for a split night sleep study to evaluate for sleep apnea.  Follow with Dr Lamonte Sakai after your sleep testing is done to review.   Baltazar Apo, MD, PhD 03/16/2018, 2:41 PM [place] [facility] [phone] or if no answer [phone]

## 2017-09-16 ENCOUNTER — Other Ambulatory Visit: Payer: Self-pay | Admitting: Emergency Medicine

## 2017-09-16 DIAGNOSIS — R400 Somnolence: Secondary | ICD-10-CM

## 2017-09-16 DIAGNOSIS — R5382 Chronic fatigue, unspecified: Secondary | ICD-10-CM

## 2017-09-21 ENCOUNTER — Encounter (HOSPITAL_BASED_OUTPATIENT_CLINIC_OR_DEPARTMENT_OTHER): Payer: 59

## 2017-09-22 ENCOUNTER — Encounter (HOSPITAL_BASED_OUTPATIENT_CLINIC_OR_DEPARTMENT_OTHER): Payer: 59

## 2017-10-14 ENCOUNTER — Ambulatory Visit: Payer: 59 | Admitting: Emergency Medicine

## 2017-10-15 ENCOUNTER — Encounter: Payer: Self-pay | Admitting: Emergency Medicine

## 2017-10-16 NOTE — Telephone Encounter (Signed)
I downloaded the sleep study this morning from the machine she brought back & gave the report to Dr Halford Chessman to read.  She told Bri at the front desk the pulse ox didn't work so I put a note on the report & asked Dr Halford Chessman if I need to reschedule the HST. He will be able to tell if he has the info he needs or if she will need to repeat the study.

## 2017-10-16 NOTE — Telephone Encounter (Signed)
Please see patient's email message below. Can you please help with this?  This sleep study tool is not working.  40 minutes later and the spO piece is still not working.

## 2017-11-06 DIAGNOSIS — G4733 Obstructive sleep apnea (adult) (pediatric): Secondary | ICD-10-CM | POA: Diagnosis not present

## 2017-11-09 ENCOUNTER — Other Ambulatory Visit: Payer: Self-pay | Admitting: *Deleted

## 2017-11-09 DIAGNOSIS — R400 Somnolence: Secondary | ICD-10-CM

## 2017-11-12 DIAGNOSIS — G4733 Obstructive sleep apnea (adult) (pediatric): Secondary | ICD-10-CM | POA: Diagnosis not present

## 2017-11-13 ENCOUNTER — Ambulatory Visit (INDEPENDENT_AMBULATORY_CARE_PROVIDER_SITE_OTHER): Payer: 59 | Admitting: Emergency Medicine

## 2017-11-13 ENCOUNTER — Ambulatory Visit (INDEPENDENT_AMBULATORY_CARE_PROVIDER_SITE_OTHER)
Admission: RE | Admit: 2017-11-13 | Discharge: 2017-11-13 | Disposition: A | Discharge status: Home / Self Care | Payer: 59 | Source: Ambulatory Visit | Attending: Emergency Medicine | Admitting: Emergency Medicine

## 2017-11-13 ENCOUNTER — Other Ambulatory Visit (INDEPENDENT_AMBULATORY_CARE_PROVIDER_SITE_OTHER): Payer: 59

## 2017-11-13 ENCOUNTER — Encounter: Payer: Self-pay | Admitting: Emergency Medicine

## 2017-11-13 VITALS — BP 122/70 | HR 89 | Ht 67.0 in | Wt 235.0 lb

## 2017-11-13 DIAGNOSIS — Z5181 Encounter for therapeutic drug level monitoring: Secondary | ICD-10-CM

## 2017-11-13 DIAGNOSIS — J4520 Mild intermittent asthma, uncomplicated: Secondary | ICD-10-CM

## 2017-11-13 DIAGNOSIS — R0602 Shortness of breath: Secondary | ICD-10-CM

## 2017-11-13 DIAGNOSIS — J8481 Lymphangioleiomyomatosis: Secondary | ICD-10-CM

## 2017-11-13 LAB — CBC WITH DIFFERENTIAL/PLATELET
Basophils Absolute: 0 10*3/uL (ref 0.0–0.1)
Basophils Relative: 0.7 % (ref 0.0–3.0)
EOS PCT: 0.8 % (ref 0.0–5.0)
Eosinophils Absolute: 0 10*3/uL (ref 0.0–0.7)
HCT: 39.8 % (ref 36.0–46.0)
Hemoglobin: 13.2 g/dL (ref 12.0–15.0)
LYMPHS ABS: 2.1 10*3/uL (ref 0.7–4.0)
Lymphocytes Relative: 39.2 % (ref 12.0–46.0)
MCHC: 33.1 g/dL (ref 30.0–36.0)
MCV: 89.9 fl (ref 78.0–100.0)
MONO ABS: 0.2 10*3/uL (ref 0.1–1.0)
MONOS PCT: 4.7 % (ref 3.0–12.0)
NEUTROS ABS: 2.9 10*3/uL (ref 1.4–7.7)
NEUTROS PCT: 54.6 % (ref 43.0–77.0)
PLATELETS: 312 10*3/uL (ref 150.0–400.0)
RBC: 4.43 Mil/uL (ref 3.87–5.11)
RDW: 14.6 % (ref 11.5–15.5)
WBC: 5.3 10*3/uL (ref 4.0–10.5)

## 2017-11-13 LAB — PULMONARY FUNCTION TEST
DL/VA % pred: 85 %
DL/VA: 4.4 ml/min/mmHg/L
DLCO unc % pred: 59 %
DLCO unc: 16.77 ml/min/mmHg
FEF 25-75 Post: 1.86 L/s
FEF 25-75 Pre: 2.37 L/s
FEF2575-%Change-Post: -21 %
FEF2575-%Pred-Post: 59 %
FEF2575-%Pred-Pre: 76 %
FEV1-%Change-Post: -5 %
FEV1-%Pred-Post: 74 %
FEV1-%Pred-Pre: 79 %
FEV1-Post: 2.13 L
FEV1-Pre: 2.26 L
FEV1FVC-%Change-Post: 1 %
FEV1FVC-%Pred-Pre: 97 %
FEV6-%Change-Post: -6 %
FEV6-%Pred-Post: 75 %
FEV6-%Pred-Pre: 81 %
FEV6-Post: 2.57 L
FEV6-Pre: 2.76 L
FEV6FVC-%Pred-Post: 102 %
FEV6FVC-%Pred-Pre: 102 %
FVC-%Change-Post: -6 %
FVC-%Pred-Post: 74 %
FVC-%Pred-Pre: 79 %
FVC-Post: 2.57 L
FVC-Pre: 2.76 L
Post FEV1/FVC ratio: 83 %
Post FEV6/FVC ratio: 100 %
Pre FEV1/FVC ratio: 82 %
Pre FEV6/FVC Ratio: 100 %
RV % pred: 92 %
RV: 1.57 L
TLC % pred: 77 %
TLC: 4.28 L

## 2017-11-13 LAB — COMPREHENSIVE METABOLIC PANEL
ALK PHOS: 52 U/L (ref 39–117)
ALT: 10 U/L (ref 0–35)
AST: 10 U/L (ref 0–37)
Albumin: 4.2 g/dL (ref 3.5–5.2)
BILIRUBIN TOTAL: 0.3 mg/dL (ref 0.2–1.2)
BUN: 13 mg/dL (ref 6–23)
CO2: 29 meq/L (ref 19–32)
Calcium: 9.7 mg/dL (ref 8.4–10.5)
Chloride: 104 mEq/L (ref 96–112)
Creatinine, Ser: 0.74 mg/dL (ref 0.40–1.20)
GFR: 112.69 mL/min (ref >60.00)
GLUCOSE: 87 mg/dL (ref 70–99)
POTASSIUM: 3.5 meq/L (ref 3.5–5.1)
Sodium: 140 mEq/L (ref 135–145)
TOTAL PROTEIN: 7.4 g/dL (ref 6.0–8.3)

## 2017-11-13 NOTE — Progress Notes (Signed)
Subjective:    Patient ID: Brenda Williams, female    DOB: [birth year], 37 y.o.   MRN: [id number]  HPI 37 yo woman, never smoker, little PMH. Referred for abnormal CT scan characterized by cystic changes, a single stable RML nodule.  Has undergone VATS biopsy, tissue diagnosis of lymphangioleiomyomatosis.  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  She also has obstructive lung disease on pulmonary function testing.  We started her on sirolimus in April 2018 but she has had some difficulty staying on the medication.  We checked labs 05/26/17 that showed a normal CBC, normal CMP.  Her sirolimus level was low at 1.0 (therapeutic range 3.0-18.0 mcg/L).  We repeated a CT scan of the chest on 06/03/17 that I have reviewed.  This shows stable cystic changes, a stable 8 x 10 mm right middle lobe nodule.   She is feeling better. Minimal dyspnea, good exercise tolerance.   ROV 08/07/17 --this is a follow-up visit for 37 year old woman with LAM and associated thin-walled polycystic lung disease.  She has obstructive lung disease by pulmonary function testing.  She is currently on sirolimus 1 mg daily. She had an episode of CP 2 weeks ago > CXR no evidence PTX. Resolved on its own. She has been more SOB w exertion. She has benefited from her inhaled medication, albuterol.   ROV 09/08/17 --this is a follow-up visit for 37 year old woman with LAM, associated obstructive lung disease and polycystic lung disease.  At her last visit we restarted Symbicort.  I also rechecked her sirolimus level which was again less than 1.0. She notes that the Symbicort caused tachycardia, unclear that it helped her breathing. She continues to have some exertional SOB. She does not use albuterol. She c/o sleepiness. She has some snoring.   ROV 11/13/17 --follow-up visit for patient with history of LAM, some associated obstructive lung disease with polycystic  lung disease on her imaging.  We have managed her on sirolimus.  She is off bronchodilators currently.  We repeated her pulmonary function testing today to compare with your priors. I have reviewed, shows evidence for possible mild mixed disease, no change from prior 1 year ago. Normal diffusion when adjusted for Va. She uses albuterol very rarely. Tolerating sirolimus. She had some transient sharp R chest and flank pain a couple weeks ago. She can still feel it.   Review of Systems As per HPI     Objective:   Physical Exam Vitals:   11/13/17 1443  BP: 122/70  Pulse: 89  SpO2: 96%  Weight: 235 lb (106.6 kg)  Height: 5\' 7"  (1.702 m)   Body mass index is 36.81 kg/m.  Gen: Pleasant, obese woman, in no distress,  normal affect  ENT: No lesions,  mouth clear,  oropharynx clear, no postnasal drip  Neck: No JVD, no stridor  Lungs: No use of accessory muscles, distant, clear without rales or rhonchi  Cardiovascular: RRR, heart sounds normal, no murmur or gallops, no peripheral edema  Musculoskeletal: No deformities, no cyanosis or clubbing  Neuro: alert, non focal  Skin: Warm, no lesions or rashes    CT Chest 06/03/17 --  COMPARISON:  Prior examinations dating back to 06/30/2014.  FINDINGS: Cardiovascular: Vascular structures are unremarkable. Heart size within normal limits. No pericardial effusion.  Mediastinum/Nodes: No pathologically enlarged mediastinal or axillary lymph nodes. Hilar regions are difficult to definitively evaluate without IV contrast. Esophagus is grossly unremarkable.  Lungs/Pleura: Cystic changes are seen  throughout the lungs. Mildly lobulated nodule in the right middle lobe measures 8 x 10 mm, stable from prior exams when differences in slice collimation are considered. Postoperative scarring in the right hemithorax. No pleural fluid. Airway is unremarkable.  Upper Abdomen: Visualized portions of the liver, adrenal glands, kidneys, spleen,  pancreas, stomach and bowel are grossly unremarkable.  Musculoskeletal: Degenerative changes in the spine. No worrisome lytic or sclerotic lesions.  IMPRESSION: 1. Cystic lung disease, consistent with the known diagnosis of lymphangioleiomyomatosis. 2. Right middle lobe nodule, stable and most likely benign   Assessment & Plan:    Mild intermittent asthma Currently managed off of scheduled BD, on albuterol prn. Uses rarely. Continue same plan.   Lymphangioleiomyomatosis ([place]) Appears to be clinically stable without any changes in dyspnea.  Her pulmonary function testing today appears to be grossly stable compared with one year ago.  She has had some transient sharp right-sided chest discomfort, unclear etiology but she is at increased risk for pneumothorax.  She needs a chest x-ray today to look for interval change and also possible pneumo thorax.  We will plan to continue the sirolimus, check her level and her other lab work today.  She needs a repeat CT chest in November to look for interval change in her interstitial disease and cystic disease.  Baltazar Apo, MD, PhD 11/13/2017, 3:34 PM [facility] [phone] or if no answer [phone]

## 2017-11-13 NOTE — Assessment & Plan Note (Signed)
Currently managed off of scheduled BD, on albuterol prn. Uses rarely. Continue same plan.

## 2017-11-13 NOTE — Assessment & Plan Note (Signed)
Appears to be clinically stable without any changes in dyspnea.  Her pulmonary function testing today appears to be grossly stable compared with one year ago.  She has had some transient sharp right-sided chest discomfort, unclear etiology but she is at increased risk for pneumothorax.  She needs a chest x-ray today to look for interval change and also possible pneumo thorax.  We will plan to continue the sirolimus, check her level and her other lab work today.  She needs a repeat CT chest in November to look for interval change in her interstitial disease and cystic disease.

## 2017-11-13 NOTE — Progress Notes (Signed)
PFT completed 11/13/17

## 2017-11-13 NOTE — Patient Instructions (Signed)
We will perform a CXR today We will perform blood work today.  Please continue your sirolimus as you are taking it.  Keep albuterol available to use 2 puffs if needed for shortness of breath.  We will plan to repeat your Ct chest in November 2019.

## 2017-11-16 ENCOUNTER — Telehealth: Payer: Self-pay | Admitting: Emergency Medicine

## 2017-11-16 LAB — SIROLIMUS LEVEL: Sirolimus (Rapamycin): <1 mcg/L — CL (ref 3.0–18.0)

## 2017-11-16 NOTE — Telephone Encounter (Signed)
Recived a call from [employer]. Pt's Sirolimus level came back critically low at <1.0.  Dr. Lamonte Sakai - please advise. Thanks.

## 2017-11-18 ENCOUNTER — Encounter (HOSPITAL_COMMUNITY): Payer: Self-pay

## 2017-11-18 ENCOUNTER — Other Ambulatory Visit: Payer: Self-pay

## 2017-11-18 ENCOUNTER — Inpatient Hospital Stay (HOSPITAL_COMMUNITY)
Admission: AD | Admit: 2017-11-18 | Discharge: 2017-11-19 | DRG: 885 | Disposition: A | Discharge status: Home / Self Care | Payer: 59 | Source: Intra-hospital | Attending: Psychiatry | Admitting: Psychiatry

## 2017-11-18 ENCOUNTER — Emergency Department (HOSPITAL_COMMUNITY)
Admission: EM | Admit: 2017-11-18 | Discharge: 2017-11-18 | Disposition: A | Discharge status: Other Acute Inpatient Hospital | Payer: 59 | Attending: Emergency Medicine | Admitting: Emergency Medicine

## 2017-11-18 ENCOUNTER — Encounter (HOSPITAL_COMMUNITY): Payer: Self-pay | Admitting: Nurse Practitioner

## 2017-11-18 DIAGNOSIS — F419 Anxiety disorder, unspecified: Secondary | ICD-10-CM

## 2017-11-18 DIAGNOSIS — Z813 Family history of other psychoactive substance abuse and dependence: Secondary | ICD-10-CM

## 2017-11-18 DIAGNOSIS — R45851 Suicidal ideations: Secondary | ICD-10-CM

## 2017-11-18 DIAGNOSIS — Z818 Family history of other mental and behavioral disorders: Secondary | ICD-10-CM | POA: Diagnosis not present

## 2017-11-18 DIAGNOSIS — Z630 Problems in relationship with spouse or partner: Secondary | ICD-10-CM

## 2017-11-18 DIAGNOSIS — E282 Polycystic ovarian syndrome: Secondary | ICD-10-CM | POA: Diagnosis present

## 2017-11-18 DIAGNOSIS — Z79899 Other long term (current) drug therapy: Secondary | ICD-10-CM

## 2017-11-18 DIAGNOSIS — Z885 Allergy status to narcotic agent status: Secondary | ICD-10-CM

## 2017-11-18 DIAGNOSIS — M1990 Unspecified osteoarthritis, unspecified site: Secondary | ICD-10-CM | POA: Diagnosis present

## 2017-11-18 DIAGNOSIS — J45909 Unspecified asthma, uncomplicated: Secondary | ICD-10-CM | POA: Diagnosis present

## 2017-11-18 DIAGNOSIS — T404X2A Poisoning by other synthetic narcotics, intentional self-harm, initial encounter: Secondary | ICD-10-CM | POA: Diagnosis present

## 2017-11-18 DIAGNOSIS — Z6835 Body mass index (BMI) 35.0-35.9, adult: Secondary | ICD-10-CM | POA: Diagnosis not present

## 2017-11-18 DIAGNOSIS — Z811 Family history of alcohol abuse and dependence: Secondary | ICD-10-CM | POA: Diagnosis not present

## 2017-11-18 DIAGNOSIS — T1491XA Suicide attempt, initial encounter: Secondary | ICD-10-CM

## 2017-11-18 DIAGNOSIS — E669 Obesity, unspecified: Secondary | ICD-10-CM | POA: Diagnosis present

## 2017-11-18 DIAGNOSIS — E739 Lactose intolerance, unspecified: Secondary | ICD-10-CM | POA: Diagnosis present

## 2017-11-18 DIAGNOSIS — F322 Major depressive disorder, single episode, severe without psychotic features: Secondary | ICD-10-CM | POA: Diagnosis not present

## 2017-11-18 DIAGNOSIS — F332 Major depressive disorder, recurrent severe without psychotic features: Secondary | ICD-10-CM | POA: Diagnosis present

## 2017-11-18 DIAGNOSIS — T40602A Poisoning by unspecified narcotics, intentional self-harm, initial encounter: Secondary | ICD-10-CM

## 2017-11-18 LAB — ACETAMINOPHEN LEVEL: Acetaminophen (Tylenol), Serum: <10 ug/mL — ABNORMAL LOW (ref 10–30)

## 2017-11-18 LAB — COMPREHENSIVE METABOLIC PANEL
ALK PHOS: 45 U/L (ref 38–126)
ALT: 15 U/L (ref 14–54)
AST: 15 U/L (ref 15–41)
Albumin: 3.8 g/dL (ref 3.5–5.0)
Anion gap: 7 (ref 5–15)
BILIRUBIN TOTAL: 0.5 mg/dL (ref 0.3–1.2)
BUN: 11 mg/dL (ref 6–20)
CALCIUM: 9.3 mg/dL (ref 8.9–10.3)
CO2: 23 mmol/L (ref 22–32)
CREATININE: 0.71 mg/dL (ref 0.44–1.00)
Chloride: 110 mmol/L (ref 101–111)
GFR calc non Af Amer: >60 mL/min (ref >60)
Glucose, Bld: 111 mg/dL — ABNORMAL HIGH (ref 65–99)
Potassium: 3.7 mmol/L (ref 3.5–5.1)
Sodium: 140 mmol/L (ref 135–145)
TOTAL PROTEIN: 7.2 g/dL (ref 6.5–8.1)

## 2017-11-18 LAB — I-STAT BETA HCG BLOOD, ED (MC, WL, AP ONLY)
I-stat hCG, quantitative: <5 m[IU]/mL (ref <5)
I-stat hCG, quantitative: <5 m[IU]/mL (ref <5)

## 2017-11-18 LAB — CBC
HEMATOCRIT: 40.3 % (ref 36.0–46.0)
Hemoglobin: 13.2 g/dL (ref 12.0–15.0)
MCH: 29.3 pg (ref 26.0–34.0)
MCHC: 32.8 g/dL (ref 30.0–36.0)
MCV: 89.6 fL (ref 78.0–100.0)
Platelets: 327 10*3/uL (ref 150–400)
RBC: 4.5 MIL/uL (ref 3.87–5.11)
RDW: 14.5 % (ref 11.5–15.5)
WBC: 7 10*3/uL (ref 4.0–10.5)

## 2017-11-18 LAB — RAPID URINE DRUG SCREEN, HOSP PERFORMED
AMPHETAMINES: NOT DETECTED
Barbiturates: NOT DETECTED
Benzodiazepines: NOT DETECTED
COCAINE: NOT DETECTED
OPIATES: NOT DETECTED
TETRAHYDROCANNABINOL: NOT DETECTED

## 2017-11-18 LAB — ETHANOL: Alcohol, Ethyl (B): <10 mg/dL (ref <10)

## 2017-11-18 LAB — CBG MONITORING, ED: Glucose-Capillary: 106 mg/dL — ABNORMAL HIGH (ref 65–99)

## 2017-11-18 LAB — SALICYLATE LEVEL: Salicylate Lvl: <7 mg/dL (ref 2.8–30.0)

## 2017-11-18 MED ORDER — CITALOPRAM HYDROBROMIDE 10 MG PO TABS
10.0000 mg | ORAL_TABLET | Freq: Every day | ORAL | Status: DC
  Administered 2017-11-18 – 2017-11-19 (×2): 10 mg via ORAL
  Filled 2017-11-18 (×4): qty 1

## 2017-11-18 MED ORDER — HYDROXYZINE HCL 25 MG PO TABS: 25.0000 mg | ORAL_TABLET | Freq: Three times a day (TID) | ORAL | Status: DC | PRN

## 2017-11-18 MED ORDER — MAGNESIUM HYDROXIDE 400 MG/5ML PO SUSP: 30.0000 mL | Freq: Every day | ORAL | Status: DC | PRN

## 2017-11-18 MED ORDER — ONDANSETRON HCL 4 MG PO TABS
4.0000 mg | ORAL_TABLET | Freq: Three times a day (TID) | ORAL | Status: DC | PRN
Start: 2017-11-18 — End: 2017-11-18

## 2017-11-18 MED ORDER — ONDANSETRON HCL 4 MG PO TABS: 4.0000 mg | ORAL_TABLET | Freq: Three times a day (TID) | ORAL | Status: DC | PRN

## 2017-11-18 MED ORDER — SIROLIMUS 1 MG PO TABS
1.0000 mg | ORAL_TABLET | Freq: Every day | ORAL | Status: DC
  Administered 2017-11-18 – 2017-11-19 (×2): 1 mg via ORAL
  Filled 2017-11-18 (×4): qty 1

## 2017-11-18 MED ORDER — DIPHENHYDRAMINE HCL 50 MG/ML IJ SOLN
25.0000 mg | Freq: Once | INTRAMUSCULAR | Status: AC
  Administered 2017-11-18: 25 mg via INTRAVENOUS
  Filled 2017-11-18: qty 1

## 2017-11-18 MED ORDER — SIROLIMUS 1 MG PO TABS
1.0000 mg | ORAL_TABLET | Freq: Every day | ORAL | Status: DC
  Filled 2017-11-18: qty 1

## 2017-11-18 MED ORDER — ACETAMINOPHEN 325 MG PO TABS: 650.0000 mg | ORAL_TABLET | Freq: Four times a day (QID) | ORAL | Status: DC | PRN

## 2017-11-18 MED ORDER — TRAZODONE HCL 50 MG PO TABS: 50.0000 mg | ORAL_TABLET | Freq: Every evening | ORAL | Status: DC | PRN

## 2017-11-18 MED ORDER — ALBUTEROL SULFATE HFA 108 (90 BASE) MCG/ACT IN AERS: 2.0000 | INHALATION_SPRAY | RESPIRATORY_TRACT | Status: DC | PRN

## 2017-11-18 MED ORDER — ACETAMINOPHEN 500 MG PO TABS: 500.0000 mg | ORAL_TABLET | Freq: Four times a day (QID) | ORAL | Status: DC | PRN

## 2017-11-18 MED ORDER — ALUM & MAG HYDROXIDE-SIMETH 200-200-20 MG/5ML PO SUSP: 30.0000 mL | ORAL | Status: DC | PRN

## 2017-11-18 MED ORDER — ALUM & MAG HYDROXIDE-SIMETH 200-200-20 MG/5ML PO SUSP: 30.0000 mL | Freq: Four times a day (QID) | ORAL | Status: DC | PRN

## 2017-11-18 MED ORDER — HYDROXYZINE HCL 25 MG PO TABS: 25.0000 mg | ORAL_TABLET | Freq: Four times a day (QID) | ORAL | Status: DC | PRN

## 2017-11-18 MED ORDER — ACETAMINOPHEN 325 MG PO TABS: 650.0000 mg | ORAL_TABLET | ORAL | Status: DC | PRN

## 2017-11-18 NOTE — ED Notes (Signed)
Poison control called back. No further recommendations.

## 2017-11-18 NOTE — Progress Notes (Deleted)
Recreation Therapy Notes  Date: 5.8.19 Time: 0930 Location: [address] Dayroom  Group Topic: Stress Management  Goal Area(s) Addresses:  Patient will verbalize importance of using healthy stress management.  Patient will identify positive emotions associated with healthy stress management.   Intervention: Stress Management  Activity :  Meditation.  LRT introduced the stress management technique of meditation.  LRT played a meditation that focused on the strength and resilience of mountains and how those characteristics can be used in daily life.  Patients were to follow along as meditation played.  Education:  Stress Management, Discharge Planning.   Education Outcome: Acknowledges edcuation/In group clarification offered/Needs additional education  Clinical Observations/Feedback: Pt did not attend group.      Pearlean Sabina Linday, LRT/CTRS         Victorino Sparrow A 11/18/2017 11:47 AM

## 2017-11-18 NOTE — ED Notes (Signed)
Report given. [facility] transport called.

## 2017-11-18 NOTE — H&P (Signed)
Psychiatric Admission Assessment Adult  Patient Identification: Brenda Williams MRN:  [id number] Date of Evaluation:  11/18/2017 Chief Complaint:  mdd single episode severe Principal Diagnosis: <principal problem not specified> Diagnosis:   Patient Active Problem List   Diagnosis Date Noted  . MDD (major depressive disorder), recurrent episode, severe ([place]) [F33.2] 11/18/2017  . MDD (major depressive disorder), recurrent severe, without psychosis ([place]) [F33.2] 11/18/2017  . Mild intermittent asthma [J45.20] 06/12/2017  . Depression [F32.9] 03/17/2017  . Vitamin D deficiency [E55.9] 03/17/2017  . Class 2 obesity with serious comorbidity and body mass index (BMI) of 35.0 to 35.9 in adult [IMO0001] 03/17/2017  . Prediabetes [R73.03] 02/23/2017  . 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 Obesity [E66.9] 01/12/2015  . Chronic fatigue [R53.82] 12/01/2014  . Solitary pulmonary nodule [R91.1] 06/28/2013  . Cervix abnormality [N88.9] 05/12/2011  . Polycystic ovaries [E28.2] 05/12/2011  . Female infertility of unspecified origin [N97.9] 05/12/2011   History of Present Illness: Patient is seen and examined.  Patient is a 37 year old female with a probable past psychiatric history significant for depression and anxiety who presented to the [facility] emergency department after an intentional overdose of 12-15 tramadol 50 mg tablets.  Patient stated that she and her husband had gotten into an argument, and she felt minimized by this.  They have had problems ongoing for some time.  She got so frustrated that she took the overdose.  She felt as though it was stupid to minutes after she did it.  She has no real previous psychiatric history.  She has a negative family psychiatric history.  She has not taken psychiatric medications for depression or anxiety in the past.  She was admitted to the hospital for evaluation and stabilization. Associated Signs/Symptoms: Depression Symptoms:  depressed  mood, anhedonia, anxiety, (Hypo) Manic Symptoms:  Impulsivity, Anxiety Symptoms:  Excessive Worry, Psychotic Symptoms:  Denied PTSD Symptoms: Negative Total Time spent with patient: 45 minutes  Past Psychiatric History: Patient has a negative psychiatric history.  Is the patient at risk to self? No.  Has the patient been a risk to self in the past 6 months? No.  Has the patient been a risk to self within the distant past? No.  Is the patient a risk to others? No.  Has the patient been a risk to others in the past 6 months? No.  Has the patient been a risk to others within the distant past? No.   Prior Inpatient Therapy:   Prior Outpatient Therapy:    Alcohol Screening: 1. How often do you have a drink containing alcohol?: Never 2. How many drinks containing alcohol do you have on a typical day when you are drinking?: 1 or 2 3. How often do you have six or more drinks on one occasion?: Never AUDIT-C Score: 0 4. How often during the last year have you found that you were not able to stop drinking once you had started?: Never 5. How often during the last year have you failed to do what was normally expected from you becasue of drinking?: Never 7. How often during the last year have you had a feeling of guilt of remorse after drinking?: Never 9. Have you or someone else been injured as a result of your drinking?: No 10. Has a relative or friend or a doctor or another health worker been concerned about your drinking or suggested you cut down?: No Alcohol Use Disorder Identification Test Final Score (AUDIT): 0 Substance Abuse History in the last 12 months:  No. Consequences of Substance Abuse: Negative Previous Psychotropic Medications: No  Psychological Evaluations: No  Past Medical History:  Past Medical History:  Diagnosis Date  . Abnormal Pap smear 2006   leep  . Anxiety   . Arthritis    osteoarthritis  . Back pain   . Complication of anesthesia    [facility] ??  SEIZURE/ SHAKING  . Constipation   . Depression   . Dyspnea   . GERD (gastroesophageal reflux disease)   . Headache   . Herpes    never had outbreak. pos per blood, doesn't know which type  . History of chicken pox   . Joint pain   . Lactose intolerance   . Lymphangiomatosis   . Polycystic ovarian syndrome   . Prediabetes   . Shortness of breath dyspnea    W/ EXERTION   . SVD (spontaneous vaginal delivery)    x 1  . Varicose vein of leg   . Vitamin D deficiency     Past Surgical History:  Procedure Laterality Date  . DILATION AND EVACUATION N/A 09/14/2012   Procedure: DILATATION AND EVACUATION;  Surgeon: Allena Katz, MD;  Location: [facility];  Service: Gynecology;  Laterality: N/A;  . HYSTEROSCOPY  2009   uterine polyp  . IVF Retrival  2010, 2011  . KNEE SURGERY  1998   right knee  . LEEP  2006  . LUNG BIOPSY Right 12/28/2015   Procedure: RIGHT LUNG BIOPSY;  Surgeon: Ivin Poot, MD;  Location: [facility];  Service: Thoracic;  Laterality: Right;  . SPHINCTEROTOMY  2010  . uterine polyp removal    . VIDEO ASSISTED THORACOSCOPY Right 12/28/2015   Procedure: RIGHT VIDEO ASSISTED THORACOSCOPY;  Surgeon: Ivin Poot, MD;  Location: [facility];  Service: Thoracic;  Laterality: Right;   Family History:  Family History  Problem Relation Age of Onset  . Diabetes Maternal Grandmother   . Heart disease Maternal Grandmother   . Hypertension Maternal Grandfather   . Cancer Maternal Grandfather 70       bone, colon   . Diabetes Maternal Grandfather   . Diabetes Mother   . Cancer Mother 82       breast cancer  . Hyperlipidemia Mother   . Depression Mother   . Obesity Mother   . Hypertension Father   . Hyperlipidemia Father   . Thyroid disease Father   . Alcohol abuse Father   . Drug abuse Father   . Obesity Father   . Anesthesia problems Neg Hx    Family Psychiatric  History: She denied any family psychiatric history Tobacco Screening:   Social History:  Social  History   Substance and Sexual Activity  Alcohol Use Yes  . Alcohol/week: 0.0 oz   Comment: OCC     Social History   Substance and Sexual Activity  Drug Use No    Additional Social History:                           Allergies:   Allergies  Allergen Reactions  . Fentanyl Itching    Benadryl effective for itching.   Lab Results:  Results for orders placed or performed during the hospital encounter of 11/18/17 (from the past 48 hour(s))  Comprehensive metabolic panel     Status: Abnormal   Collection Time: 11/18/17 12:57 AM  Result Value Ref Range   Sodium 140 135 - 145 mmol/L  Potassium 3.7 3.5 - 5.1 mmol/L   Chloride 110 101 - 111 mmol/L   CO2 23 22 - 32 mmol/L   Glucose, Bld 111 (H) 65 - 99 mg/dL   BUN 11 6 - 20 mg/dL   Creatinine, Ser 0.71 0.44 - 1.00 mg/dL   Calcium 9.3 8.9 - 10.3 mg/dL   Total Protein 7.2 6.5 - 8.1 g/dL   Albumin 3.8 3.5 - 5.0 g/dL   AST 15 15 - 41 U/L   ALT 15 14 - 54 U/L   Alkaline Phosphatase 45 38 - 126 U/L   Total Bilirubin 0.5 0.3 - 1.2 mg/dL   GFR calc non Af Amer >60 >60 mL/min   GFR calc Af Amer >60 >60 mL/min    Comment: (NOTE) The eGFR has been calculated using the CKD EPI equation. This calculation has not been validated in all clinical situations. eGFR's persistently <60 mL/min signify possible Chronic Kidney Disease.    Anion gap 7 5 - 15    Comment: Performed at [facility], [place] [address], [place], [place] [address]  Ethanol     Status: None   Collection Time: 11/18/17 12:57 AM  Result Value Ref Range   Alcohol, Ethyl (B) <10 <10 mg/dL    Comment:        LOWEST DETECTABLE LIMIT FOR SERUM ALCOHOL IS 10 mg/dL FOR MEDICAL PURPOSES ONLY Performed at [facility] [address] [place], [place] [address]   Salicylate level     Status: None   Collection Time: 11/18/17 12:57 AM  Result Value Ref Range   Salicylate Lvl <4.4 2.8 - 30.0 mg/dL    Comment: Performed at [facility], [place] [address] [place], [place] [address]  Acetaminophen level     Status: Abnormal   Collection Time: 11/18/17 12:57 AM  Result Value Ref Range   Acetaminophen (Tylenol), Serum <10 (L) 10 - 30 ug/mL    Comment:        THERAPEUTIC CONCENTRATIONS VARY SIGNIFICANTLY. A RANGE OF 10-30 ug/mL MAY BE AN EFFECTIVE CONCENTRATION FOR MANY PATIENTS. HOWEVER, SOME ARE BEST TREATED AT CONCENTRATIONS OUTSIDE THIS RANGE. ACETAMINOPHEN CONCENTRATIONS >150 ug/mL AT 4 HOURS AFTER INGESTION AND >50 ug/mL AT 12 HOURS AFTER INGESTION ARE OFTEN ASSOCIATED WITH TOXIC REACTIONS. Performed at [facility], [place] [address], [place], [place] [address]   cbc     Status: None   Collection Time: 11/18/17 12:57 AM  Result Value Ref Range   WBC 7.0 4.0 - 10.5 K/uL   RBC 4.50 3.87 - 5.11 MIL/uL   Hemoglobin 13.2 12.0 - 15.0 g/dL   HCT 40.3 36.0 - 46.0 %   MCV 89.6 78.0 - 100.0 fL   MCH 29.3 26.0 - 34.0 pg   MCHC 32.8 30.0 - 36.0 g/dL   RDW 14.5 11.5 - 15.5 %   Platelets 327 150 - 400 K/uL    Comment: Performed at [facility], [place] [address], [place], [place] [address]  Rapid urine drug screen (hospital performed)     Status: None   Collection Time: 11/18/17 12:57 AM  Result Value Ref Range   Opiates NONE DETECTED NONE DETECTED   Cocaine NONE DETECTED NONE DETECTED   Benzodiazepines NONE DETECTED NONE DETECTED   Amphetamines NONE DETECTED NONE DETECTED   Tetrahydrocannabinol NONE DETECTED NONE DETECTED   Barbiturates NONE DETECTED NONE DETECTED    Comment: (NOTE) DRUG SCREEN FOR MEDICAL PURPOSES ONLY.  IF CONFIRMATION IS NEEDED  FOR ANY PURPOSE, NOTIFY LAB WITHIN 5 DAYS. LOWEST DETECTABLE LIMITS FOR URINE DRUG SCREEN Drug Class                     Cutoff (ng/mL) Amphetamine and metabolites    1000 Barbiturate and metabolites    200 Benzodiazepine                 161 Tricyclics and metabolites     300 Opiates and metabolites         300 Cocaine and metabolites        300 THC                            50 Performed at [facility], [place] [address], [place], [place] [address]   CBG monitoring, ED     Status: Abnormal   Collection Time: 11/18/17 12:59 AM  Result Value Ref Range   Glucose-Capillary 106 (H) 65 - 99 mg/dL  I-Stat beta hCG blood, ED     Status: None   Collection Time: 11/18/17  1:03 AM  Result Value Ref Range   I-stat hCG, quantitative <5.0 <5 mIU/mL   Comment 3            Comment:   GEST. AGE      CONC.  (mIU/mL)   <=1 WEEK        5 - 50     2 WEEKS       50 - 500     3 WEEKS       100 - 10,000     4 WEEKS     1,000 - 30,000        FEMALE AND NON-PREGNANT FEMALE:     LESS THAN 5 mIU/mL   Acetaminophen level     Status: Abnormal   Collection Time: 11/18/17  4:03 AM  Result Value Ref Range   Acetaminophen (Tylenol), Serum <10 (L) 10 - 30 ug/mL    Comment:        THERAPEUTIC CONCENTRATIONS VARY SIGNIFICANTLY. A RANGE OF 10-30 ug/mL MAY BE AN EFFECTIVE CONCENTRATION FOR MANY PATIENTS. HOWEVER, SOME ARE BEST TREATED AT CONCENTRATIONS OUTSIDE THIS RANGE. ACETAMINOPHEN CONCENTRATIONS >150 ug/mL AT 4 HOURS AFTER INGESTION AND >50 ug/mL AT 12 HOURS AFTER INGESTION ARE OFTEN ASSOCIATED WITH TOXIC REACTIONS. Performed at [facility], [place] [address], [place], [place] [address]   I-Stat beta hCG blood, ED     Status: None   Collection Time: 11/18/17  6:46 AM  Result Value Ref Range   I-stat hCG, quantitative <5.0 <5 mIU/mL   Comment 3            Comment:   GEST. AGE      CONC.  (mIU/mL)   <=1 WEEK        5 - 50     2 WEEKS       50 - 500     3 WEEKS       100 - 10,000     4 WEEKS     1,000 - 30,000        FEMALE AND NON-PREGNANT FEMALE:     LESS THAN 5 mIU/mL     Blood Alcohol level:  Lab Results  Component Value Date   ETH <10 11/91/4782    Metabolic Disorder Labs:  Lab Results  Component Value Date   HGBA1C 6.1 (H) 01/28/2017   No  results  found for: PROLACTIN Lab Results  Component Value Date   CHOL 233 (H) 01/28/2017   TRIG 99 01/28/2017   HDL 53 01/28/2017   CHOLHDL 4 09/18/2015   VLDL 12.6 09/18/2015   LDLCALC 160 (H) 01/28/2017   LDLCALC 154 (H) 09/18/2015    Current Medications: Current Facility-Administered Medications  Medication Dose Route Frequency Provider Last Rate Last Dose  . acetaminophen (TYLENOL) tablet 650 mg  650 mg Oral Q6H PRN Ethelene Hal, NP      . albuterol (PROVENTIL HFA;VENTOLIN HFA) 108 (90 Base) MCG/ACT inhaler 2 puff  2 puff Inhalation Q4H PRN Laverle Hobby, PA-C      . alum & mag hydroxide-simeth (MAALOX/MYLANTA) 200-200-20 MG/5ML suspension 30 mL  30 mL Oral Q4H PRN Ethelene Hal, NP      . citalopram (CELEXA) tablet 10 mg  10 mg Oral Daily Sharma Covert, MD      . hydrOXYzine (ATARAX/VISTARIL) tablet 25 mg  25 mg Oral TID PRN Ethelene Hal, NP      . magnesium hydroxide (MILK OF MAGNESIA) suspension 30 mL  30 mL Oral Daily PRN Laverle Hobby, PA-C      . ondansetron [facility]) tablet 4 mg  4 mg Oral Q8H PRN Ethelene Hal, NP      . sirolimus (RAPAMUNE) tablet 1 mg  1 mg Oral Daily Ethelene Hal, NP      . traZODone (DESYREL) tablet 50 mg  50 mg Oral QHS PRN Ethelene Hal, NP       PTA Medications: Medications Prior to Admission  Medication Sig Dispense Refill Last Dose  . acetaminophen (TYLENOL) 500 MG tablet Take 500 mg by mouth every 6 (six) hours as needed.   Past Month at Unknown time  . albuterol (PROVENTIL HFA;VENTOLIN HFA) 108 (90 Base) MCG/ACT inhaler Inhale 2 puffs into the lungs every 4 (four) hours as needed for wheezing or shortness of breath. 1 Inhaler 5 Past Month at Unknown time  . ibuprofen (ADVIL,MOTRIN) 800 MG tablet Take 800 mg by mouth every 8 (eight) hours as needed for moderate pain.    Past Month at Unknown time  . sirolimus (RAPAMUNE) 1 MG tablet Take 1 tablet (1 mg total) daily by mouth. 90 tablet 1 Past Week  at Unknown time    Musculoskeletal: Strength & Muscle Tone: within normal limits Gait & Station: normal Patient leans: N/A  Psychiatric Specialty Exam: Physical Exam  Nursing note and vitals reviewed. Constitutional: She is oriented to person, place, and time. She appears well-developed and well-nourished.  HENT:  Head: Normocephalic and atraumatic.  Respiratory: Effort normal.  Neurological: She is alert and oriented to person, place, and time.    ROS  Blood pressure (!) 121/96, pulse 60, temperature 98 F (36.7 C), temperature source Oral, resp. rate 18, height _0  (1.727 m), weight 105.2 kg (232 lb), last menstrual period 11/10/2017, SpO2 98 %.Body mass index is 35.28 kg/m.  General Appearance: Casual  Eye Contact:  Fair  Speech:  Normal Rate  Volume:  Normal  Mood:  Anxious  Affect:  Congruent  Thought Process:  Coherent  Orientation:  Full (Time, Place, and Person)  Thought Content:  Logical  Suicidal Thoughts:  No  Homicidal Thoughts:  No  Memory:  Immediate;   Fair  Judgement:  Fair  Insight:  Fair  Psychomotor Activity:  Normal  Concentration:  Concentration: Good  Recall:  [place] of Knowledge:  Fair  Language:  Good  Akathisia:  No  Handed:  Right  AIMS (if indicated):     Assets:  Communication Skills Desire for Improvement Financial Resources/Insurance Housing Resilience  ADL's:  Intact  Cognition:  WNL  Sleep:       Treatment Plan Summary: Daily contact with patient to assess and evaluate symptoms and progress in treatment, Medication management and Plan Patient is a 37 year old female with the above-stated past psychiatric history who was admitted because of an intentional overdose of tramadol.  I think this was just an impulsive event.  She does have some underlying depression anxiety, and is agreed to a trial of citalopram 10 mg p.o. daily.  We will start that this morning.  I have spoken with her husband, and he feels as though this was an  impulsive event.  He acknowledges the marital problems that they have, and I have encouraged her that after discharge she continued to see his psychiatrist, get involved in individual psychotherapy, and also resume marital therapy.  She will be admitted to the unit, and will put her on 15-minute checks for safety reasons.  She will be encouraged to attend groups and work on her coping skills.  Observation Level/Precautions:  15 minute checks  Laboratory:  Chemistry Profile  Psychotherapy:    Medications:    Consultations:    Discharge Concerns:    Estimated LOS:  Other:     Physician Treatment Plan for Primary Diagnosis: <principal problem not specified> Long Term Goal(s): Improvement in symptoms so as ready for discharge  Short Term Goals: Ability to identify changes in lifestyle to reduce recurrence of condition will improve, Ability to verbalize feelings will improve, Ability to disclose and discuss suicidal ideas, Ability to demonstrate self-control will improve, Ability to identify and develop effective coping behaviors will improve, Ability to maintain clinical measurements within normal limits will improve and Compliance with prescribed medications will improve  Physician Treatment Plan for Secondary Diagnosis: Active Problems:   MDD (major depressive disorder), recurrent episode, severe ([facility])   MDD (major depressive disorder), recurrent severe, without psychosis ([place])  Long Term Goal(s): Improvement in symptoms so as ready for discharge  Short Term Goals: Ability to identify changes in lifestyle to reduce recurrence of condition will improve, Ability to verbalize feelings will improve, Ability to disclose and discuss suicidal ideas, Ability to demonstrate self-control will improve, Ability to identify and develop effective coping behaviors will improve, Ability to maintain clinical measurements within normal limits will improve and Compliance with prescribed medications will  improve  I certify that inpatient services furnished can reasonably be expected to improve the patient's condition.    Sharma Covert, MD 5/8/20191:35 PM

## 2017-11-18 NOTE — ED Provider Notes (Signed)
[facility] DEPT Provider Note   CSN: [id number] Arrival date & time: 11/18/17  0040     History   Chief Complaint Chief Complaint  Patient presents with  . Ingestion    HPI Brenda Williams is a 37 y.o. female.  The history is provided by the patient.  She has history of lymphangioleiomyomatosis, depression, GERD, polycystic ovarian syndrome and comes in after taking an overdose at home.  She took between 10 and 12 tramadol 50 mg tablets at about 11 PM.  She states that she initially had suicidal intent, but no longer wishes to harm herself.  She does admit to a long history of depression states she is not being treated for that.  She denies crying spells but admits to early morning wakening and anhedonia.  She denies hallucinations.  She denies ethanol and drug use.  She has had suicidal thoughts in the past, but has not had any prior suicide attempts.  Past Medical History:  Diagnosis Date  . Abnormal Pap smear 2006   leep  . Anxiety   . Arthritis    osteoarthritis  . Back pain   . Complication of anesthesia    [facility], HAD ??  SEIZURE/ SHAKING  . Constipation   . Depression   . Dyspnea   . GERD (gastroesophageal reflux disease)   . Headache   . Herpes    never had outbreak. pos per blood, doesn't know which type  . History of chicken pox   . Joint pain   . Lactose intolerance   . Lymphangiomatosis   . Polycystic ovarian syndrome   . Prediabetes   . Shortness of breath dyspnea    W/ EXERTION   . SVD (spontaneous vaginal delivery)    x 1  . Varicose vein of leg   . Vitamin D deficiency     Patient Active Problem List   Diagnosis Date Noted  . Mild intermittent asthma 06/12/2017  . Depression 03/17/2017  . Vitamin D deficiency 03/17/2017  . Class 2 obesity with serious comorbidity and body mass index (BMI) of 35.0 to 35.9 in adult 03/17/2017  . Prediabetes 02/23/2017  . Lymphangioleiomyomatosis ([place]) 12/28/2015    . Obesity 01/12/2015  . Chronic fatigue 12/01/2014  . Solitary pulmonary nodule 06/28/2013  . Cervix abnormality 05/12/2011  . Polycystic ovaries 05/12/2011  . Female infertility of unspecified origin 05/12/2011    Past Surgical History:  Procedure Laterality Date  . DILATION AND EVACUATION N/A 09/14/2012   Procedure: DILATATION AND EVACUATION;  Surgeon: Allena Katz, MD;  Location: [facility];  Service: Gynecology;  Laterality: N/A;  . HYSTEROSCOPY  2009   uterine polyp  . IVF Retrival  2010, 2011  . KNEE SURGERY  1998   right knee  . LEEP  2006  . LUNG BIOPSY Right 12/28/2015   Procedure: RIGHT LUNG BIOPSY;  Surgeon: Ivin Poot, MD;  Location: [facility];  Service: Thoracic;  Laterality: Right;  . SPHINCTEROTOMY  2010  . uterine polyp removal    . VIDEO ASSISTED THORACOSCOPY Right 12/28/2015   Procedure: RIGHT VIDEO ASSISTED THORACOSCOPY;  Surgeon: Ivin Poot, MD;  Location: [facility] OR;  Service: Thoracic;  Laterality: Right;     OB History    Gravida  5   Para  1   Term  1   Preterm  0   AB  2   Living  1     SAB  1  TAB  0   Ectopic  1   Multiple  0   Live Births  1            Home Medications    Prior to Admission medications   Medication Sig Start Date End Date Taking? Authorizing Provider  acetaminophen (TYLENOL) 500 MG tablet Take 500 mg by mouth every 6 (six) hours as needed.   Yes [provider]  albuterol (PROVENTIL HFA;VENTOLIN HFA) 108 (90 Base) MCG/ACT inhaler Inhale 2 puffs into the lungs every 4 (four) hours as needed for wheezing or shortness of breath. 06/12/17  Yes Collene Gobble, MD  ibuprofen (ADVIL,MOTRIN) 800 MG tablet Take 800 mg by mouth every 8 (eight) hours as needed for moderate pain.    Yes [provider]  sirolimus (RAPAMUNE) 1 MG tablet Take 1 tablet (1 mg total) daily by mouth. 05/26/17  Yes Byrum, Rose Fillers, MD    Family History Family History  Problem Relation Age of Onset  . Diabetes Maternal  Grandmother   . Heart disease Maternal Grandmother   . Hypertension Maternal Grandfather   . Cancer Maternal Grandfather 70       bone, colon   . Diabetes Maternal Grandfather   . Diabetes Mother   . Cancer Mother 26       breast cancer  . Hyperlipidemia Mother   . Depression Mother   . Obesity Mother   . Hypertension Father   . Hyperlipidemia Father   . Thyroid disease Father   . Alcohol abuse Father   . Drug abuse Father   . Obesity Father   . Anesthesia problems Neg Hx     Social History Social History   Tobacco Use  . Smoking status: Never Smoker  . Smokeless tobacco: Never Used  Substance Use Topics  . Alcohol use: Yes    Alcohol/week: 0.0 oz    Comment: OCC  . Drug use: No     Allergies   Fentanyl   Review of Systems Review of Systems  All other systems reviewed and are negative.    Physical Exam Updated Vital Signs BP (!) 122/99 (BP Location: Right Arm)   Pulse 69   Temp 97.7 F (36.5 C) (Oral)   Resp 11   LMP 11/10/2017   Physical Exam  Nursing note and vitals reviewed.  37 year old female, resting comfortably and in no acute distress. Vital signs are significant for elevated diastolic blood pressure. Oxygen saturation is 93%, which is normal. Head is normocephalic and atraumatic. PERRLA, EOMI. Oropharynx is clear. Neck is nontender and supple without adenopathy or JVD. Back is nontender and there is no CVA tenderness. Lungs are clear without rales, wheezes, or rhonchi. Chest is nontender. Heart has regular rate and rhythm without murmur. Abdomen is soft, flat, nontender without masses or hepatosplenomegaly and peristalsis is normoactive. Extremities have no cyanosis or edema, full range of motion is present. Skin is warm and dry without rash. Neurologic: She is awake and alert but with a slightly depressed affect, cranial nerves are intact, there are no motor or sensory deficits.  ED Treatments / Results  Labs (all labs ordered are  listed, but only abnormal results are displayed) Labs Reviewed  COMPREHENSIVE METABOLIC PANEL - Abnormal; Notable for the following components:      Result Value   Glucose, Bld 111 (*)    All other components within normal limits  ACETAMINOPHEN LEVEL - Abnormal; Notable for the following components:   Acetaminophen (Tylenol), Serum <  10 (*)    All other components within normal limits  ACETAMINOPHEN LEVEL - Abnormal; Notable for the following components:   Acetaminophen (Tylenol), Serum <10 (*)    All other components within normal limits  CBG MONITORING, ED - Abnormal; Notable for the following components:   Glucose-Capillary 106 (*)    All other components within normal limits  ETHANOL  SALICYLATE LEVEL  CBC  RAPID URINE DRUG SCREEN, HOSP PERFORMED  I-STAT BETA HCG BLOOD, ED ([facility], [facility], [facility] ONLY)  I-STAT BETA HCG BLOOD, ED ([facility], [facility], [facility] ONLY)    EKG EKG Interpretation  Date/Time:  Wednesday Nov 18 2017 01:12:02 EDT Ventricular Rate:  65 PR Interval:    QRS Duration: 93 QT Interval:  413 QTC Calculation: 430 R Axis:   45 Text Interpretation:  Sinus rhythm Borderline T abnormalities, diffuse leads When compared with ECG of 12/26/2015, No significant change was found Confirmed by Delora Fuel ([id number]) on 11/18/2017 1:16:09 AM   Procedures Procedures   Medications Ordered in ED Medications  alum & mag hydroxide-simeth (MAALOX/MYLANTA) 200-200-20 MG/5ML suspension 30 mL (has no administration in time range)  ondansetron (ZOFRAN) tablet 4 mg (has no administration in time range)  acetaminophen (TYLENOL) tablet 650 mg (has no administration in time range)  sirolimus (RAPAMUNE) tablet 1 mg (has no administration in time range)  albuterol (PROVENTIL HFA;VENTOLIN HFA) 108 (90 Base) MCG/ACT inhaler 2 puff (has no administration in time range)     Initial Impression / Assessment and Plan / ED Course  I have reviewed the triage vital signs and the nursing notes.  Pertinent lab results  that were available during my care of the patient were reviewed by me and considered in my medical decision making (see chart for details).  Major depression with suicide attempts.  Tramadol dose is probably not dangerous, but she will need to be observed in the ED.  Poison control has been consulted and recommended she be observed for 6 hours following ingestion.  Following that, will need evaluation by TTS.  Old records are reviewed, and I see no prior visits for mental health issues.  5:19 AM She has been observed in the ED without any respiratory depression or excessive sedation and is felt to be safe for transfer to psychiatric observation area.  TTS consultation is requested.  Final Clinical Impressions(s) / ED Diagnoses   Final diagnoses:  Overdose opiate, intentional self-harm, initial encounter [facility])  Suicidal ideation    ED Discharge Orders    None       Delora Fuel, MD 32/35/57 [phone]
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
## 2017-11-18 NOTE — BHH Group Notes (Signed)
Adult Psychoeducational Group Note  Date:  11/18/2017 Time:  10:52 PM  Group Topic/Focus:  Wrap-Up Group:   The focus of this group is to help patients review their daily goal of treatment and discuss progress on daily workbooks.  Participation Level:  Active  Participation Quality:  Appropriate and Attentive  Affect:  Appropriate  Cognitive:  Alert and Appropriate  Insight: Appropriate and Good  Engagement in Group:  Engaged  Modes of Intervention:  Discussion and Education  Additional Comments:  Pt attended and participated in wrap up group this evening. Pt had a "good and bad" day due to them not knowing that they were coming here, but they have met a lot of nice people. Pt goal to work on while they are here is to gather their thoughts and to work on consistency.   Cristi Loron 11/18/2017, 10:52 PM
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
## 2017-11-18 NOTE — BH Assessment (Signed)
[facility] Assessment Progress Note  Per Patriciaann Clan, PA, this pt requires psychiatric hospitalization at this time.  Inocencio Homes, RN, [facility] has assigned pt to [facility] Rm 402-1; [facility] will be ready to receive pt after 08:00.  This Probation officer spoke to pt about consents, and found that pt had already signed Consent for Admission and Treatment.  She has misgivings about this, reporting that she is to graduate from college tomorrow, and has family coming from out of town.  I explained the 72 hour release provision detailed on the consent form.  Pt has signed Consent to Release Information to her husband, and both signed forms have been faxed to [facility].  Pt's nurse has been notified, and agrees to send original paperwork along with pt via [place], and to call report to [phone].  Jalene Mullet, [place] Coordinator [phone]

## 2017-11-18 NOTE — BH Assessment (Addendum)
Pt has been accepted to [facility] Bed 1. Pt can come after 8am. Accepting: Patriciaann Clan, PA Attending: Dr Parke Poisson [phone]
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
## 2017-11-18 NOTE — ED Triage Notes (Signed)
Pt took 12 tramadol 50mg  each for a suicide attempt, EMS was called by her husband Pt states she's stressed and has many family issues

## 2017-11-18 NOTE — ED Notes (Signed)
Attempted to give report. RN in the middle of medication pass. [facility] nurse will call back after.

## 2017-11-18 NOTE — ED Notes (Signed)
Poison control contacted 

## 2017-11-18 NOTE — BHH Suicide Risk Assessment (Signed)
[facility] Admission Suicide Risk Assessment   Nursing information obtained from:    Demographic factors:    Current Mental Status:    Loss Factors:    Historical Factors:    Risk Reduction Factors:     Total Time spent with patient: 45 minutes Principal Problem: <principal problem not specified> Diagnosis:   Patient Active Problem List   Diagnosis Date Noted  . MDD (major depressive disorder), recurrent episode, severe ([place]) [F33.2] 11/18/2017  . MDD (major depressive disorder), recurrent severe, without psychosis ([place]) [F33.2] 11/18/2017  . Mild intermittent asthma [J45.20] 06/12/2017  . Depression [F32.9] 03/17/2017  . Vitamin D deficiency [E55.9] 03/17/2017  . Class 2 obesity with serious comorbidity and body mass index (BMI) of 35.0 to 35.9 in adult [IMO0001] 03/17/2017  . Prediabetes [R73.03] 02/23/2017  . 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 Obesity [E66.9] 01/12/2015  . Chronic fatigue [R53.82] 12/01/2014  . Solitary pulmonary nodule [R91.1] 06/28/2013  . Cervix abnormality [N88.9] 05/12/2011  . Polycystic ovaries [E28.2] 05/12/2011  . Female infertility of unspecified origin [N97.9] 05/12/2011   Subjective Data: Patient is seen and examined.  Patient is a 37 year old female with a past psychiatric history significant for unspecified anxiety and depression who presented to the [facility] emergency room yesterday after taken an intentional overdose of 10-12 tramadol tablets.  Patient stated she got into an argument with her husband, and felt minimized by the argument.  She is graduating from [employer], and this hurt her greatly.  She states she does have some underlying anxiety as well as depression, but this is the first time she ever did anything abruptly.  She denied any previous psychiatric admissions, and denied any real treatment with psychiatric medications.  She admitted that she still has grief issues but the death of her more mother 4 years  ago, and the upcoming Mother's Day is causing that as well.  She was admitted to the hospital for evaluation and stabilization.  Continued Clinical Symptoms:  Alcohol Use Disorder Identification Test Final Score (AUDIT): 0 The "Alcohol Use Disorders Identification Test", Guidelines for Use in Primary Care, Second Edition.  World Pharmacologist [facility]). Score between 0-7:  no or low risk or alcohol related problems. Score between 8-15:  moderate risk of alcohol related problems. Score between 16-19:  high risk of alcohol related problems. Score 20 or above:  warrants further diagnostic evaluation for alcohol dependence and treatment.   CLINICAL FACTORS:   Depression:   Anhedonia Hopelessness Impulsivity   Musculoskeletal: Strength & Muscle Tone: within normal limits Gait & Station: normal Patient leans: N/A  Psychiatric Specialty Exam: Physical Exam  Nursing note and vitals reviewed. Constitutional: She is oriented to person, place, and time. She appears well-developed and well-nourished.  HENT:  Head: Normocephalic and atraumatic.  Respiratory: Effort normal.  Musculoskeletal: Normal range of motion.  Neurological: She is alert and oriented to person, place, and time.    ROS  Blood pressure (!) 121/96, pulse 60, temperature 98 F (36.7 C), temperature source Oral, resp. rate 18, height 5\' 8"  (1.727 m), weight 105.2 kg (232 lb), last menstrual period 11/10/2017, SpO2 98 %.Body mass index is 35.28 kg/m.  General Appearance: Casual  Eye Contact:  Good  Speech:  Normal Rate  Volume:  Normal  Mood:  Anxious  Affect:  Congruent  Thought Process:  Coherent  Orientation:  Full (Time, Place, and Person)  Thought Content:  Logical  Suicidal Thoughts:  No  Homicidal Thoughts:  No  Memory:  Immediate;   Fair  Judgement:  Intact  Insight:  Fair  Psychomotor Activity:  Normal  Concentration:  Concentration: Fair  Recall:  [place] of Knowledge:  Good  Language:  Fair   Akathisia:  Negative  Handed:  Right  AIMS (if indicated):     Assets:  Desire for Improvement Resilience Social Support Vocational/Educational  ADL's:  Intact  Cognition:  WNL  Sleep:         COGNITIVE FEATURES THAT CONTRIBUTE TO RISK:  None    SUICIDE RISK:   Minimal: No identifiable suicidal ideation.  Patients presenting with no risk factors but with morbid ruminations; may be classified as minimal risk based on the severity of the depressive symptoms  PLAN OF CARE: Patient is seen and examined.  Patient is a 37 year old female with a probable past psychiatric history for depression as well as anxiety.  This was an impulsive event.  She has not seen a psychiatrist before, no previous psychiatric admissions before, no attempts at self-harm.  She has agreed to a trial of Celexa 10 mg p.o. daily.  I talked to her husband and he is agreed with her past history.  If everything goes well we will plan on discharging her tomorrow.  She is encouraged to go to groups.  She is encouraged to attempt learning some coping skills.  I certify that inpatient services furnished can reasonably be expected to improve the patient's condition.   Sharma Covert, MD 11/18/2017, 1:21 PM

## 2017-11-19 MED ORDER — TRAZODONE HCL 50 MG PO TABS: 50.0000 mg | ORAL_TABLET | Freq: Every evening | ORAL | 0 refills | Status: DC | PRN

## 2017-11-19 MED ORDER — HYDROXYZINE HCL 25 MG PO TABS: 25.0000 mg | ORAL_TABLET | Freq: Three times a day (TID) | ORAL | 0 refills | Status: DC | PRN

## 2017-11-19 MED ORDER — CITALOPRAM HYDROBROMIDE 10 MG PO TABS: 10.0000 mg | ORAL_TABLET | Freq: Every day | ORAL | 0 refills | Status: DC

## 2017-11-19 NOTE — BHH Suicide Risk Assessment (Signed)
[facility] Discharge Suicide Risk Assessment   Principal Problem: <principal problem not specified> Discharge Diagnoses:  Patient Active Problem List   Diagnosis Date Noted  . MDD (major depressive disorder), recurrent episode, severe ([place]) [F33.2] 11/18/2017  . MDD (major depressive disorder), recurrent severe, without psychosis ([place]) [F33.2] 11/18/2017  . Mild intermittent asthma [J45.20] 06/12/2017  . Depression [F32.9] 03/17/2017  . Vitamin D deficiency [E55.9] 03/17/2017  . Class 2 obesity with serious comorbidity and body mass index (BMI) of 35.0 to 35.9 in adult [IMO0001] 03/17/2017  . Prediabetes [R73.03] 02/23/2017  . 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 Obesity [E66.9] 01/12/2015  . Chronic fatigue [R53.82] 12/01/2014  . Solitary pulmonary nodule [R91.1] 06/28/2013  . Cervix abnormality [N88.9] 05/12/2011  . Polycystic ovaries [E28.2] 05/12/2011  . Female infertility of unspecified origin [N97.9] 05/12/2011    Total Time spent with patient: 30 minutes  Musculoskeletal: Strength & Muscle Tone: within normal limits Gait & Station: normal Patient leans: N/A  Psychiatric Specialty Exam: Review of Systems  All other systems reviewed and are negative.   Blood pressure (!) 119/94, pulse 76, temperature 98.1 F (36.7 C), temperature source Oral, resp. rate 16, height 5\' 8"  (1.727 m), weight 105.2 kg (232 lb), last menstrual period 11/10/2017, SpO2 98 %.Body mass index is 35.28 kg/m.  General Appearance: Casual  Eye Contact::  Good  Speech:  Normal Rate409  Volume:  Normal  Mood:  Euthymic  Affect:  Appropriate  Thought Process:  Coherent  Orientation:  Full (Time, Place, and Person)  Thought Content:  Logical  Suicidal Thoughts:  No  Homicidal Thoughts:  No  Memory:  Immediate;   Good  Judgement:  Fair  Insight:  Fair  Psychomotor Activity:  Normal  Concentration:  Good  Recall:  Good  Fund of Knowledge:Good  Language: Good  Akathisia:  Negative   Handed:  Right  AIMS (if indicated):     Assets:  Communication Skills Desire for Improvement Financial Resources/Insurance Housing Resilience Talents/Skills  Sleep:     Cognition: WNL  ADL's:  Intact   Mental Status Per Nursing Assessment::   On Admission:     Demographic Factors:  NA  Loss Factors: NA  Historical Factors: Impulsivity  Risk Reduction Factors:   Responsible for children under 60 years of age and Sense of responsibility to family  Continued Clinical Symptoms:  Depression:   Impulsivity  Cognitive Features That Contribute To Risk:  None    Suicide Risk:  Minimal: No identifiable suicidal ideation.  Patients presenting with no risk factors but with morbid ruminations; may be classified as minimal risk based on the severity of the depressive symptoms    Plan Of Care/Follow-up recommendations:  Activity:  ad lib  Sharma Covert, MD 11/19/2017, 7:41 AM

## 2017-11-19 NOTE — Progress Notes (Signed)
Adult Psychoeducational Group Note  Date:  11/19/2017 Time:  1:46 PM  Group Topic/Focus:  Goals Group:   The focus of this group is to help patients establish daily goals to achieve during treatment and discuss how the patient can incorporate goal setting into their daily lives to aide in recovery.  Participation Level:  Active  Participation Quality:  Appropriate  Affect:  Appropriate  Cognitive:  Alert  Insight: Appropriate  Engagement in Group:  Engaged  Modes of Intervention:  Discussion  Additional Comments:  Pt attended group and participated in group discussion.  Faizon Capozzi R Paiden Cavell 11/19/2017, 1:46 PM

## 2017-11-19 NOTE — Discharge Summary (Signed)
Physician Discharge Summary Note  Patient:  Brenda Williams is an 37 y.o., female MRN:  [id number] DOB:  06-09-1981 Patient phone:  [phone] (home)  Patient address:   [place] [address],  Total Time spent with patient: 20 minutes  Date of Admission:  11/18/2017 Date of Discharge: 11/19/17  Reason for Admission:  Worsening depression with intentional overdose  Principal Problem: MDD (major depressive disorder), recurrent episode, severe [facility]) Discharge Diagnoses: Patient Active Problem List   Diagnosis Date Noted  . MDD (major depressive disorder), recurrent episode, severe ([place]) [F33.2] 11/18/2017  . Mild intermittent asthma [J45.20] 06/12/2017  . Depression [F32.9] 03/17/2017  . Vitamin D deficiency [E55.9] 03/17/2017  . Class 2 obesity with serious comorbidity and body mass index (BMI) of 35.0 to 35.9 in adult [IMO0001] 03/17/2017  . Prediabetes [R73.03] 02/23/2017  . 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 Obesity [E66.9] 01/12/2015  . Chronic fatigue [R53.82] 12/01/2014  . Solitary pulmonary nodule [R91.1] 06/28/2013  . Cervix abnormality [N88.9] 05/12/2011  . Polycystic ovaries [E28.2] 05/12/2011  . Female infertility of unspecified origin [N97.9] 05/12/2011    Past Psychiatric History: Denies  Past Medical History:  Past Medical History:  Diagnosis Date  . Abnormal Pap smear 2006   leep  . Anxiety   . Arthritis    osteoarthritis  . Back pain   . Complication of anesthesia    [facility], HAD ??  SEIZURE/ SHAKING  . Constipation   . Depression   . Dyspnea   . GERD (gastroesophageal reflux disease)   . Headache   . Herpes    never had outbreak. pos per blood, doesn't know which type  . History of chicken pox   . Joint pain   . Lactose intolerance   . Lymphangiomatosis   . Polycystic ovarian syndrome   . Prediabetes   . Shortness of breath dyspnea    W/ EXERTION   . SVD (spontaneous vaginal delivery)     x 1  . Varicose vein of leg   . Vitamin D deficiency     Past Surgical History:  Procedure Laterality Date  . DILATION AND EVACUATION N/A 09/14/2012   Procedure: DILATATION AND EVACUATION;  Surgeon: Allena Katz, MD;  Location: [facility];  Service: Gynecology;  Laterality: N/A;  . HYSTEROSCOPY  2009   uterine polyp  . IVF Retrival  2010, 2011  . KNEE SURGERY  1998   right knee  . LEEP  2006  . LUNG BIOPSY Right 12/28/2015   Procedure: RIGHT LUNG BIOPSY;  Surgeon: Ivin Poot, MD;  Location: [facility];  Service: Thoracic;  Laterality: Right;  . SPHINCTEROTOMY  2010  . uterine polyp removal    . VIDEO ASSISTED THORACOSCOPY Right 12/28/2015   Procedure: RIGHT VIDEO ASSISTED THORACOSCOPY;  Surgeon: Ivin Poot, MD;  Location: [facility];  Service: Thoracic;  Laterality: Right;   Family History:  Family History  Problem Relation Age of Onset  . Diabetes Maternal Grandmother   . Heart disease Maternal Grandmother   . Hypertension Maternal Grandfather   . Cancer Maternal Grandfather 70       bone, colon   . Diabetes Maternal Grandfather   . Diabetes Mother   . Cancer Mother 23       breast cancer  . Hyperlipidemia Mother   . Depression Mother   . Obesity Mother   . Hypertension Father   . Hyperlipidemia Father   . Thyroid disease Father   .  Alcohol abuse Father   . Drug abuse Father   . Obesity Father   . Anesthesia problems Neg Hx    Family Psychiatric  History: Denies Social History:  Social History   Substance and Sexual Activity  Alcohol Use Yes  . Alcohol/week: 0.0 oz   Comment: OCC     Social History   Substance and Sexual Activity  Drug Use No    Social History   Socioeconomic History  . Marital status: Married    Spouse name: Astronomer  . Number of children: Not on file  . Years of education: Not on file  . Highest education level: Not on file  Occupational History  . Occupation: account coordinator  Social Needs  . Financial resource strain: Not on  file  . Food insecurity:    Worry: Not on file    Inability: Not on file  . Transportation needs:    Medical: Not on file    Non-medical: Not on file  Tobacco Use  . Smoking status: Never Smoker  . Smokeless tobacco: Never Used  Substance and Sexual Activity  . Alcohol use: Yes    Alcohol/week: 0.0 oz    Comment: OCC  . Drug use: No  . Sexual activity: Yes    Birth control/protection: None  Lifestyle  . Physical activity:    Days per week: Not on file    Minutes per session: Not on file  . Stress: Not on file  Relationships  . Social connections:    Talks on phone: Not on file    Gets together: Not on file    Attends religious service: Not on file    Active member of club or organization: Not on file    Attends meetings of clubs or organizations: Not on file    Relationship status: Not on file  Other Topics Concern  . Not on file  Social History Narrative  . Not on file    Hospital Course:   11/18/17 [facility] MD Assessment: Patient is seen and examined.  Patient is a 37 year old female with a probable past psychiatric history significant for depression and anxiety who presented to the [facility] emergency department after an intentional overdose of 12-15 tramadol 50 mg tablets.  Patient stated that she and her husband had gotten into an argument, and she felt minimized by this.  They have had problems ongoing for some time.  She got so frustrated that she took the overdose.  She felt as though it was stupid to minutes after she did it.  She has no real previous psychiatric history.  She has a negative family psychiatric history.  She has not taken psychiatric medications for depression or anxiety in the past.  She was admitted to the hospital for evaluation and stabilization.  Patient remained on the [facility] unit for 1 days. The patient stabilized on medication and therapy. Patient was discharged on Celexa 10 mg Daily, Vistaril 25 mg TID PRN, and Trazodone 50 mg QHS PRN. Patient has  shown improvement with improved mood, affect, sleep, appetite, and interaction. Patient has attended group and participated. Patient has been seen in the day room interacting with peers and staff appropriately. Patient denies any SI/HI/AVH and contracts for safety. Patient agrees to follow up at [place]. Patient is provided with prescriptions for their medications upon discharge.    Physical Findings: AIMS:  , ,  ,  ,    CIWA:    COWS:     Musculoskeletal: Strength &  Muscle Tone: within normal limits Gait & Station: normal Patient leans: N/A  Psychiatric Specialty Exam: Physical Exam  Nursing note and vitals reviewed. Constitutional: She is oriented to person, place, and time. She appears well-developed and well-nourished.  Cardiovascular: Normal rate.  Respiratory: Effort normal.  Musculoskeletal: Normal range of motion.  Neurological: She is alert and oriented to person, place, and time.  Skin: Skin is warm.    Review of Systems  Constitutional: Negative.   HENT: Negative.   Eyes: Negative.   Respiratory: Negative.   Cardiovascular: Negative.   Gastrointestinal: Negative.   Genitourinary: Negative.   Musculoskeletal: Negative.   Skin: Negative.   Neurological: Negative.   Endo/Heme/Allergies: Negative.   Psychiatric/Behavioral: Negative.     Blood pressure (!) 119/94, pulse 76, temperature 98.1 F (36.7 C), temperature source Oral, resp. rate 16, height 5\' 8"  (1.727 m), weight 105.2 kg (232 lb), last menstrual period 11/10/2017, SpO2 98 %.Body mass index is 35.28 kg/m.  General Appearance: Casual  Eye Contact:  Good  Speech:  Clear and Coherent and Normal Rate  Volume:  Normal  Mood:  Euthymic  Affect:  Congruent  Thought Process:  Goal Directed and Descriptions of Associations: Intact  Orientation:  Full (Time, Place, and Person)  Thought Content:  WDL  Suicidal Thoughts:  No  Homicidal Thoughts:  No  Memory:  Immediate;   Good Recent;    Good Remote;   Good  Judgement:  Fair  Insight:  Good  Psychomotor Activity:  Normal  Concentration:  Concentration: Good and Attention Span: Good  Recall:  Good  Fund of Knowledge:  Good  Language:  Good  Akathisia:  No  Handed:  Right  AIMS (if indicated):     Assets:  Communication Skills Desire for Improvement Financial Resources/Insurance Housing Physical Health Social Support Transportation  ADL's:  Intact  Cognition:  WNL  Sleep:           Has this patient used any form of tobacco in the last 30 days? (Cigarettes, Smokeless Tobacco, Cigars, and/or Pipes) Yes, No  Blood Alcohol level:  Lab Results  Component Value Date   ETH <10 59/56/3875    Metabolic Disorder Labs:  Lab Results  Component Value Date   HGBA1C 6.1 (H) 01/28/2017   No results found for: PROLACTIN Lab Results  Component Value Date   CHOL 233 (H) 01/28/2017   TRIG 99 01/28/2017   HDL 53 01/28/2017   CHOLHDL 4 09/18/2015   VLDL 12.6 09/18/2015   LDLCALC 160 (H) 01/28/2017   LDLCALC 154 (H) 09/18/2015    See Psychiatric Specialty Exam and Suicide Risk Assessment completed by Attending Physician prior to discharge.  Discharge destination:  Home  Is patient on multiple antipsychotic therapies at discharge:  No   Has Patient had three or more failed trials of antipsychotic monotherapy by history:  No  Recommended Plan for Multiple Antipsychotic Therapies: NA   Allergies as of 11/19/2017      Reactions   Fentanyl Itching   Benadryl effective for itching.      Medication List    STOP taking these medications   acetaminophen 500 MG tablet Commonly known as:  TYLENOL   ibuprofen 800 MG tablet Commonly known as:  ADVIL,MOTRIN     TAKE these medications     Indication  albuterol 108 (90 Base) MCG/ACT inhaler Commonly known as:  PROVENTIL HFA;VENTOLIN HFA Inhale 2 puffs into the lungs every 4 (four) hours as needed for wheezing or shortness of breath.  Indication:  Asthma    citalopram 10 MG tablet Commonly known as:  CELEXA Take 1 tablet (10 mg total) by mouth daily. For mood control  Indication:  mood stability   hydrOXYzine 25 MG tablet Commonly known as:  ATARAX/VISTARIL Take 1 tablet (25 mg total) by mouth 3 (three) times daily as needed for anxiety.  Indication:  Feeling Anxious   sirolimus 1 MG tablet Commonly known as:  RAPAMUNE Take 1 tablet (1 mg total) daily by mouth.  Indication:  Per PCP   traZODone 50 MG tablet Commonly known as:  DESYREL Take 1 tablet (50 mg total) by mouth at bedtime as needed for sleep.  Indication:  [place]. Go on 11/27/2017.   Why:  Please attend your therapy intake appt on Friday, 11/27/17, at 10:30am.  Please contact [facility] by Friday, 11/20/17 to confirm this appt and pay the $20 deposit. Contact information: [place] [address] [place], [facility]. Go on 12/01/2017.   Why:  Please attend your medication appt with Eligah East on Tuesday, 12/01/17, at 2:00pm.  Your follow up visits can take place in the [place] office.  Please contact the office by 11/20/17 to confirm this appt and pay the $20 deposit. Contact information: [address] [place] [address] [phone]           Follow-up recommendations:  Continue activity as tolerated. Continue diet as recommended by your PCP. Ensure to keep all appointments with outpatient providers.  Comments:  Patient is instructed prior to discharge to: Take all medications as prescribed by his/her mental healthcare provider. Report any adverse effects and or reactions from the medicines to his/her outpatient provider promptly. Patient has been instructed & cautioned: To not engage in alcohol and or illegal drug use while on prescription medicines. In the event of worsening symptoms, patient is instructed to call the crisis hotline,  911 and or go to the nearest ED for appropriate evaluation and treatment of symptoms. To follow-up with his/her primary care provider for your other medical issues, concerns and or health care needs.    Signed: [place], FNP 11/19/2017, 10:15 AM

## 2017-11-19 NOTE — BHH Suicide Risk Assessment (Signed)
[place] INPATIENT:  Family/Significant Other Suicide Prevention Education  Suicide Prevention Education:  Education Completed; Michaella Imai, husband, [phone], has been identified by the patient as the family member/significant other with whom the patient will be residing, and identified as the person(s) who will aid the patient in the event of a mental health crisis (suicidal ideations/suicide attempt).  With written consent from the patient, the family member/significant other has been provided the following suicide prevention education, prior to the and/or following the discharge of the patient.  The suicide prevention education provided includes the following:  Suicide risk factors  Suicide prevention and interventions  National Suicide Hotline telephone number  [facility] assessment telephone number  [facility] Emergency Assistance Trail and/or Residential Mobile Crisis Unit telephone number  Request made of family/significant other to:  Remove weapons (e.g., guns, rifles, knives), all items previously/currently identified as safety concern.  No guns in the home, per [place].  Remove drugs/medications (over-the-counter, prescriptions, illicit drugs), all items previously/currently identified as a safety concern.  The family member/significant other verbalizes understanding of the suicide prevention education information provided.  The family member/significant other agrees to remove the items of safety concern listed above.  Husband believes pt depression is a combination of things: mother's day reminds pt of her mother's death, some marital/communication issues.  They are having some trouble talking through some things.    Joanne Chars, LCSW 11/19/2017, 9:35 AM

## 2017-11-19 NOTE — Progress Notes (Signed)
  [facility] Adult Case Management Discharge Plan :  Will you be returning to the same living situation after discharge:  Yes,  with family At discharge, do you have transportation home?: Yes,  husband Do you have the ability to pay for your medications: Yes,  [employer]  Release of information consent forms completed and in the chart;  Patient's signature needed at discharge.  Patient to Follow up at: Follow-up [place]. Go on 11/27/2017.   Why:  Please attend your therapy intake appt on Friday, 11/27/17, at 10:30am.  Please contact [facility] by Friday, 11/20/17 to confirm this appt and pay the $20 deposit. Contact information: [place] [phone] [place], [facility]. Go on 12/01/2017.   Why:  Please attend your medication appt with Eligah East on Tuesday, 12/01/17, at 2:00pm.  Your follow up visits can take place in the [place] office.  Please contact the office by 11/20/17 to confirm this appt and pay the $20 deposit. Contact information: [place] [address] [phone]           Next level of care provider has access to [place] and Suicide Prevention discussed: Yes,  with husband     Has patient been referred to the Quitline?: N/A patient is not a smoker  Patient has been referred for addiction treatment: N/A  Joanne Chars, [place] 11/19/2017, 10:27 AM

## 2017-11-19 NOTE — Telephone Encounter (Signed)
OK to continue current dose sirolimus. Thanks./

## 2017-11-19 NOTE — Progress Notes (Signed)
Patient ID: Brenda Williams, female   DOB: [birth year], 37 y.o.   MRN: [id number] Pt presents to the unit with a flat/blank affect. Pt observed playing card with peers. Pt at assessment denied any depression, anxiety, pain, SI, HI or AVH; "I am ready to go home. I feel much better since I got here."-Pt only just arrived today. All patient's questions and concerns addressed. Support, encouragement, and safe environment provided. Will continue to monitor for any changes. 15-minute safety checks continue. Pt attended wrap-up group. Pt did not have any scheduled night medications.

## 2017-11-19 NOTE — Progress Notes (Signed)
Patient discharged to lobby. Patient was stable and appreciative at that time. All papers and prescriptions were given and valuables returned. Verbal understanding expressed. Denies SI/HI and A/VH. Patient given opportunity to express concerns and ask questions.  

## 2017-11-19 NOTE — Telephone Encounter (Signed)
Attempted to call pt. I did not receive an answer. I have left a message for pt to return our call.  

## 2017-11-20 ENCOUNTER — Telehealth: Payer: Self-pay | Admitting: Emergency Medicine

## 2017-11-20 NOTE — Telephone Encounter (Signed)
Left a message on the pt's named voicemail letting her know her results. Nothing further was needed.

## 2017-11-20 NOTE — Telephone Encounter (Signed)
Called and spoke with pt clarifying the lab results and that she needed to continue on the sirolimus due to the levels being critically low.  Pt expressed understanding. While speaking with pt she stated she was wanting to know the results of the cxr that she had done 5/6 and also the results of the HST.  Dr. Lamonte Sakai, please advise on this for pt. Thanks!

## 2017-11-23 NOTE — Telephone Encounter (Signed)
Please let her know that her CXR was unchanged, without any evidence for lung collapse or other changes. This is good news.  Her home sleep test shows mild OSA.  We could treat this with CPAP if she is willing to do so. Please have her let us know if she would like to do so.

## 2017-11-23 NOTE — Telephone Encounter (Signed)
Called pt and advised message from the provider. Pt understood and verbalized understanding. Nothing further is needed.   She states she will talk with her husband about CPAP treatment and give [place] a return call if she wants to get one.

## 2017-12-30 ENCOUNTER — Encounter: Payer: Self-pay | Admitting: Family Medicine

## 2017-12-30 ENCOUNTER — Ambulatory Visit: Payer: 59 | Admitting: Family Medicine

## 2017-12-30 VITALS — BP 120/80 | HR 88 | Temp 98.2°F | Ht 67.0 in | Wt 234.0 lb

## 2017-12-30 DIAGNOSIS — Z0184 Encounter for antibody response examination: Secondary | ICD-10-CM

## 2017-12-30 NOTE — Progress Notes (Signed)
Chief Complaint  Patient presents with  . Follow-up    transfering to [facility]    Subjective: Patient is a 37 y.o. female here for f/u.  Patient was recently completed courses at community college and plans to transfer to [employer]  She starts at the end of summer.  She needs updated list of her immunizations.  All of her immunizations are from [place] and we have nothing on file outside of a tetanus shot in the state.   ROS: Const: No fevers   Past Medical History:  Diagnosis Date  . Abnormal Pap smear 2006   leep  . Anxiety   . Arthritis    osteoarthritis  . Back pain   . Complication of anesthesia    [facility], HAD ??  SEIZURE/ SHAKING  . Constipation   . Depression   . Dyspnea   . GERD (gastroesophageal reflux disease)   . Headache   . Herpes    never had outbreak. pos per blood, doesn't know which type  . History of chicken pox   . Joint pain   . Lactose intolerance   . Lymphangiomatosis   . Polycystic ovarian syndrome   . Prediabetes   . Shortness of breath dyspnea    W/ EXERTION   . SVD (spontaneous vaginal delivery)    x 1  . Varicose vein of leg   . Vitamin D deficiency    Objective: BP 120/80 (BP Location: Left Arm, Patient Position: Sitting, Cuff Size: Large)   Pulse 88   Temp 98.2 F (36.8 C) (Oral)   Ht 5\' 7"  (1.702 m)   Wt 234 lb (106.1 kg)   SpO2 97%   BMI 36.65 kg/m  General: Awake, appears stated age HEENT: MMM, EOMi Heart: RRR, no murmurs Lungs: CTAB, no rales, wheezes or rhonchi. No accessory muscle use Psych: Age appropriate judgment and insight, normal affect and mood  Assessment and Plan: Immunity status testing - Plan: Varicella zoster antibody, IgG, Measles ([place]) Antibody IgG, Mumps antibody, IgG, Poliovirus antibodies, types 1, 2, and 3, CANCELED: Poliovirus antibodies, types 1, 2, and 3  Ck titres. Will try to get ahold of records in [place]. she will contact the provider her father sees to see if something similar can be  done. We will follow-up for once we have this information. F/u prn. The patient voiced understanding and agreement to the plan.  [employer], DO 12/30/17  1:11 PM     Would like copy to pick up.

## 2017-12-30 NOTE — Patient Instructions (Addendum)
We will do out best to get this completed by July 1.   We will get some labs today to check for immunity.  Check with dad's doctors office about getting your immunization report.  Let us know if you need anything.

## 2017-12-31 LAB — RUBEOLA ANTIBODY IGG: Rubeola IgG: 32.3 AU/mL

## 2017-12-31 LAB — MUMPS ANTIBODY, IGG: Mumps IgG: <9 AU/mL — ABNORMAL LOW

## 2017-12-31 LAB — VARICELLA ZOSTER ANTIBODY, IGG: Varicella IgG: 322.2 index

## 2018-01-05 LAB — POLIOVIRUS ANTIBODIES, TYPES 1, 2, AND 3

## 2018-01-07 ENCOUNTER — Encounter: Payer: Self-pay | Admitting: Family Medicine

## 2018-01-08 ENCOUNTER — Ambulatory Visit: Payer: 59 | Admitting: Family Medicine

## 2018-01-08 ENCOUNTER — Encounter: Payer: Self-pay | Admitting: Family Medicine

## 2018-01-08 VITALS — BP 110/84 | HR 68 | Temp 98.1°F | Ht 67.0 in | Wt 235.1 lb

## 2018-01-08 DIAGNOSIS — Z23 Encounter for immunization: Secondary | ICD-10-CM

## 2018-01-08 DIAGNOSIS — Z0184 Encounter for antibody response examination: Secondary | ICD-10-CM | POA: Diagnosis not present

## 2018-01-08 DIAGNOSIS — E6609 Other obesity due to excess calories: Secondary | ICD-10-CM | POA: Diagnosis not present

## 2018-01-08 DIAGNOSIS — Z6836 Body mass index (BMI) 36.0-36.9, adult: Secondary | ICD-10-CM

## 2018-01-08 MED ORDER — PHENTERMINE HCL 37.5 MG PO TABS: 37.5000 mg | ORAL_TABLET | Freq: Every day | ORAL | 2 refills | Status: DC

## 2018-01-08 NOTE — Progress Notes (Signed)
Chief Complaint  Patient presents with  . New Patient (Initial Visit)    Subjective: Patient is a 37 y.o. female here for transition of care and immunizations.  Pt needs booster of Td, MMR and IPV for school.    Pt also discussed obesity. She had hx of being on Adipex. She had an injury and gained weight back, would like to use this medicine to help get the ball rolling again.  ROS: Heart: Denies chest pain   Past Medical History:  Diagnosis Date  . Abnormal Pap smear 2006   leep  . Anxiety   . Arthritis    osteoarthritis  . Back pain   . Complication of anesthesia    [facility], HAD ??  SEIZURE/ SHAKING  . Constipation   . Depression   . Dyspnea   . GERD (gastroesophageal reflux disease)   . Headache   . Herpes    never had outbreak. pos per blood, doesn't know which type  . History of chicken pox   . Joint pain   . Lactose intolerance   . Lymphangiomatosis   . Polycystic ovarian syndrome   . Prediabetes   . Shortness of breath dyspnea    W/ EXERTION   . SVD (spontaneous vaginal delivery)    x 1  . Varicose vein of leg   . Vitamin D deficiency     Objective: BP 110/84 (BP Location: Left Arm, Patient Position: Sitting, Cuff Size: Large)   Pulse 68   Temp 98.1 F (36.7 C) (Oral)   Ht 5' 7" (1.702 m)   Wt 235 lb 2 oz (106.7 kg)   SpO2 98%   BMI 36.83 kg/m  General: Awake, appears stated age Heart: RRR, no murmurs Lungs: CTAB, no rales, wheezes or rhonchi. No accessory muscle use Psych: Age appropriate judgment and insight, normal affect and mood  Assessment and Plan: Immunity status testing  Class 2 obesity due to excess calories without serious comorbidity with body mass index (BMI) of 36.0 to 36.9 in adult - Plan: phentermine (ADIPEX-P) 37.5 MG tablet  Orders as above. Understands that I will not write for this longer than 3 mo. F/u in 3 mo for CPE or prn. The patient voiced understanding and agreement to the plan.  [place], DO 01/08/18  2:37 PM

## 2018-01-08 NOTE — Addendum Note (Signed)
Addended by: Sharon Seller B on: 01/08/2018 03:15 PM   Modules accepted: Orders

## 2018-01-08 NOTE — Patient Instructions (Addendum)
Aim to do some physical exertion for 150 minutes per week. This is typically divided into 5 days per week, 30 minutes per day. The activity should be enough to get your heart rate up. Anything is better than nothing if you have time constraints.  Keep the diet clean.  Let me know if medicine is too expensive.  Let us know if you need anything.

## 2018-03-09 NOTE — Telephone Encounter (Signed)
Yes, OK to complete

## 2018-03-09 NOTE — Telephone Encounter (Signed)
Pt is requesting a handicap placard for school use.  RB please advise if ok to fill out.  Thanks!

## 2018-03-09 NOTE — Telephone Encounter (Signed)
Form has been completed and placed on the white board in triage until the patient responds as to how she wants to receive this form.

## 2018-03-16 NOTE — Assessment & Plan Note (Signed)
We will arrange for a split night sleep study to evaluate for sleep apnea.  Follow with Dr Lamonte Sakai after your sleep testing is done to review.

## 2018-03-16 NOTE — Assessment & Plan Note (Signed)
.   We will hold off on any inhaled medication at this time.  We will repeat your PFT at your next visit to compare with your priors.

## 2018-03-16 NOTE — Assessment & Plan Note (Signed)
We will continue your sirolimus at your current dose for now.  We will repeat your PFT at your next visit to compare with your priors.

## 2018-04-14 ENCOUNTER — Ambulatory Visit (INDEPENDENT_AMBULATORY_CARE_PROVIDER_SITE_OTHER): Payer: 59 | Admitting: Nurse Practitioner

## 2018-04-14 ENCOUNTER — Encounter: Payer: 59 | Admitting: Family Medicine

## 2018-04-14 ENCOUNTER — Telehealth: Payer: Self-pay | Admitting: Emergency Medicine

## 2018-04-14 ENCOUNTER — Encounter: Payer: Self-pay | Admitting: Nurse Practitioner

## 2018-04-14 VITALS — BP 118/72 | HR 87 | Temp 98.1°F | Ht 67.0 in | Wt 225.2 lb

## 2018-04-14 DIAGNOSIS — J029 Acute pharyngitis, unspecified: Secondary | ICD-10-CM

## 2018-04-14 DIAGNOSIS — Z0289 Encounter for other administrative examinations: Secondary | ICD-10-CM

## 2018-04-14 DIAGNOSIS — J0100 Acute maxillary sinusitis, unspecified: Secondary | ICD-10-CM | POA: Diagnosis not present

## 2018-04-14 MED ORDER — BENZONATATE 100 MG PO CAPS: 100.0000 mg | ORAL_CAPSULE | Freq: Three times a day (TID) | ORAL | 0 refills | Status: AC

## 2018-04-14 MED ORDER — AMOXICILLIN 875 MG PO TABS: 875.0000 mg | ORAL_TABLET | Freq: Two times a day (BID) | ORAL | 0 refills | Status: AC

## 2018-04-14 NOTE — Telephone Encounter (Signed)
Spoke with the pt  She is c/o cough and sore throat x 4 days  She is coughing up yellow to green sputum  She denies any f/c/s  OV with Tonya at 10 am today

## 2018-04-14 NOTE — Progress Notes (Signed)
_0  ID: Brenda Williams, female    DOB: 05/18/1981, 36 y.o.   MRN: [id number]  Chief Complaint  Patient presents with  . 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 Lamonte Sakai.  Tests: Chest xray 04/14/18 - chronic lung changes without acute findings Chest HRCT 06/03/17 - Cystic lung disease, consistent with the known diagnosis of Lymphangioleiomyomatosis. Right middle lobe nodule, stable and most likely benign.  OV 04/14/18 - acute sinus congestion/sore throat Patient presents with sinus congestion pressure and pain that started on 04/10/18. She also complains of sore throat. She reports productive cough with green sputum and postnasal drip. States that symptoms are progressively worsening. She has taken OTC sinus medications and tylenol with no relief noted. She denies any fever, shortness of breath, or chest pain.    Allergies  Allergen Reactions  . Fentanyl Itching    Benadryl effective for itching.    Immunization History  Administered Date(s) Administered  . IPV 01/08/2018  . Influenza Split 07/03/2011  . MMR 01/08/2018  . Pneumococcal Conjugate-13 01/28/2016  . Td 01/08/2018  . Tdap 08/15/2011    Past Medical History:  Diagnosis Date  . Abnormal Pap smear 2006   leep  . Anxiety   . Arthritis    osteoarthritis  . Back pain   . Complication of anesthesia    [facility]+c [facility], HAD ??  SEIZURE/ SHAKING  . Constipation   . Depression   . Dyspnea   . GERD (gastroesophageal reflux disease)   . Headache   . Herpes    never had outbreak. pos per blood, doesn't know which type  . History of chicken pox   . Joint pain   . Lactose intolerance   . Lymphangiomatosis   . Polycystic ovarian syndrome   . Prediabetes   . Shortness of breath dyspnea    W/ EXERTION   . SVD (spontaneous vaginal delivery)    x 1  .  Varicose vein of leg   . Vitamin D deficiency     Tobacco History: Social History   Tobacco Use  Smoking Status Never Smoker  Smokeless Tobacco Never Used   Counseling given: Not Answered   Outpatient Encounter Medications as of 04/14/2018  Medication Sig  . albuterol (PROVENTIL HFA;VENTOLIN HFA) 108 (90 Base) MCG/ACT inhaler Inhale 2 puffs into the lungs every 4 (four) hours as needed for wheezing or shortness of breath.  . phentermine (ADIPEX-P) 37.5 MG tablet Take 1 tablet (37.5 mg total) by mouth daily before breakfast.  . sirolimus (RAPAMUNE) 1 MG tablet Take 1 tablet (1 mg total) daily by mouth.  Marland Kitchen amoxicillin (AMOXIL) 875 MG tablet Take 1 tablet (875 mg total) by mouth 2 (two) times daily for 10 days.  . benzonatate (TESSALON) 100 MG capsule Take 1 capsule (100 mg total) by mouth 3 (three) times daily.   No facility-administered encounter medications on file as of 04/14/2018.      Review of Systems  Review of Systems  Constitutional: Negative.  Negative for chills and fever.  HENT: Positive for congestion, postnasal drip, sinus pressure and sinus pain.   Respiratory: Positive for cough and shortness of breath. Negative for wheezing.   Cardiovascular: Negative.  Negative for chest pain, palpitations and leg swelling.  Gastrointestinal: Negative.   Allergic/Immunologic: Negative.   Neurological: Negative.   Psychiatric/Behavioral: Negative.  Physical Exam  BP 118/72 (BP Location: Left Arm, Patient Position: Sitting, Cuff Size: Normal)   Pulse 87   Temp 98.1 F (36.7 C)   Ht _0  (1.702 m)   Wt 225 lb 3.2 oz (102.2 kg)   SpO2 98%   BMI 35.27 kg/m   Wt Readings from Last 5 Encounters:  04/14/18 225 lb 3.2 oz (102.2 kg)  01/08/18 235 lb 2 oz (106.7 kg)  12/30/17 234 lb (106.1 kg)  11/13/17 235 lb (106.6 kg)  09/08/17 232 lb (105.2 kg)     Physical Exam  Constitutional: She is oriented to person, place, and time. She appears well-developed and  well-nourished. No distress.  HENT:  Nose: Right sinus exhibits maxillary sinus tenderness and frontal sinus tenderness. Left sinus exhibits maxillary sinus tenderness and frontal sinus tenderness.  Mouth/Throat: Oropharyngeal exudate and posterior oropharyngeal erythema present.  Cardiovascular: Normal rate and regular rhythm.  Pulmonary/Chest: Effort normal and breath sounds normal.  Neurological: She is alert and oriented to person, place, and time.  Psychiatric: She has a normal mood and affect.  Nursing note and vitals reviewed.     Assessment & Plan:   Pharyngitis Patient Instructions  Will order amoxicillin  - sinusitis/pharyngitis Will order tessalon pearls for cough May take mucinex May start zyrtec daily Warm salt water gargles 3 times daily for sore throat Drink plenty of fluids Get plenty of rest Follow up with Dr. Lamonte Sakai in 1 month or sooner if needed '  Acute maxillary sinusitis Patient Instructions  Will order amoxicillin  - sinusitis/pharyngitis Will order tessalon pearls for cough May take mucinex May start zyrtec daily Warm salt water gargles 3 times daily for sore throat Drink plenty of fluids Get plenty of rest Follow up with Dr. Lamonte Sakai in 1 month or sooner if needed       Fenton Foy, NP 04/14/2018

## 2018-04-14 NOTE — Assessment & Plan Note (Signed)
Patient Instructions  Will order amoxicillin  - sinusitis/pharyngitis Will order tessalon pearls for cough May take mucinex May start zyrtec daily Warm salt water gargles 3 times daily for sore throat Drink plenty of fluids Get plenty of rest Follow up with Dr. Lamonte Sakai in 1 month or sooner if needed '

## 2018-04-14 NOTE — Assessment & Plan Note (Signed)
Patient Instructions  Will order amoxicillin  - sinusitis/pharyngitis Will order tessalon pearls for cough May take mucinex May start zyrtec daily Warm salt water gargles 3 times daily for sore throat Drink plenty of fluids Get plenty of rest Follow up with Dr. Lamonte Sakai in 1 month or sooner if needed

## 2018-04-14 NOTE — Patient Instructions (Addendum)
Will order amoxicillin  - sinusitis/pharyngitis Will order tessalon pearls for cough May take mucinex May start zyrtec daily Warm salt water gargles 3 times daily for sore throat Drink plenty of fluids Get plenty of rest Follow up with Dr. Lamonte Sakai in 1 month or sooner if needed

## 2018-04-27 ENCOUNTER — Encounter: Payer: Self-pay | Admitting: Family Medicine

## 2018-05-18 ENCOUNTER — Ambulatory Visit (INDEPENDENT_AMBULATORY_CARE_PROVIDER_SITE_OTHER)
Admission: RE | Admit: 2018-05-18 | Discharge: 2018-05-18 | Disposition: A | Discharge status: Home / Self Care | Payer: 59 | Source: Ambulatory Visit | Attending: Emergency Medicine | Admitting: Emergency Medicine

## 2018-05-18 DIAGNOSIS — J8481 Lymphangioleiomyomatosis: Secondary | ICD-10-CM | POA: Diagnosis not present

## 2018-05-18 DIAGNOSIS — R918 Other nonspecific abnormal finding of lung field: Secondary | ICD-10-CM | POA: Diagnosis not present

## 2018-05-20 ENCOUNTER — Ambulatory Visit: Payer: 59 | Admitting: Emergency Medicine

## 2018-05-20 ENCOUNTER — Other Ambulatory Visit (INDEPENDENT_AMBULATORY_CARE_PROVIDER_SITE_OTHER): Payer: 59

## 2018-05-20 ENCOUNTER — Encounter: Payer: Self-pay | Admitting: Emergency Medicine

## 2018-05-20 VITALS — BP 120/78 | HR 80 | Ht 67.0 in | Wt 224.8 lb

## 2018-05-20 DIAGNOSIS — J8481 Lymphangioleiomyomatosis: Secondary | ICD-10-CM

## 2018-05-20 DIAGNOSIS — R918 Other nonspecific abnormal finding of lung field: Secondary | ICD-10-CM | POA: Diagnosis not present

## 2018-05-20 DIAGNOSIS — J4520 Mild intermittent asthma, uncomplicated: Secondary | ICD-10-CM | POA: Diagnosis not present

## 2018-05-20 LAB — CBC WITH DIFFERENTIAL/PLATELET
BASOS PCT: 0.9 % (ref 0.0–3.0)
Basophils Absolute: 0 10*3/uL (ref 0.0–0.1)
EOS ABS: 0.1 10*3/uL (ref 0.0–0.7)
Eosinophils Relative: 1.1 % (ref 0.0–5.0)
HCT: 42.5 % (ref 36.0–46.0)
HEMOGLOBIN: 14 g/dL (ref 12.0–15.0)
LYMPHS ABS: 1.7 10*3/uL (ref 0.7–4.0)
Lymphocytes Relative: 34 % (ref 12.0–46.0)
MCHC: 33 g/dL (ref 30.0–36.0)
MCV: 89.4 fl (ref 78.0–100.0)
MONO ABS: 0.2 10*3/uL (ref 0.1–1.0)
Monocytes Relative: 4.8 % (ref 3.0–12.0)
NEUTROS PCT: 59.2 % (ref 43.0–77.0)
Neutro Abs: 2.9 10*3/uL (ref 1.4–7.7)
PLATELETS: 302 10*3/uL (ref 150.0–400.0)
RBC: 4.76 Mil/uL (ref 3.87–5.11)
RDW: 14.7 % (ref 11.5–15.5)
WBC: 5 10*3/uL (ref 4.0–10.5)

## 2018-05-20 LAB — BASIC METABOLIC PANEL
BUN: 11 mg/dL (ref 6–23)
CO2: 26 mEq/L (ref 19–32)
CREATININE: 0.74 mg/dL (ref 0.40–1.20)
Calcium: 9.9 mg/dL (ref 8.4–10.5)
Chloride: 106 mEq/L (ref 96–112)
GFR: 112.39 mL/min (ref >60.00)
Glucose, Bld: 82 mg/dL (ref 70–99)
Potassium: 4.1 mEq/L (ref 3.5–5.1)
Sodium: 139 mEq/L (ref 135–145)

## 2018-05-20 MED ORDER — SIROLIMUS 1 MG PO TABS: 1.0000 mg | ORAL_TABLET | Freq: Two times a day (BID) | ORAL | 1 refills | Status: DC

## 2018-05-20 NOTE — Progress Notes (Signed)
Subjective:    Patient ID: Brenda Williams, female    DOB: 06/21/1981, 37 y.o.   MRN: [id number]  HPI 37 yo woman, never smoker, little PMH. Referred for abnormal CT scan characterized by cystic changes, a single stable RML nodule.  Has undergone VATS biopsy, tissue diagnosis of lymphangioleiomyomatosis.   ROV 05/20/18 --Brenda Williams is 37 with a history of biopsy-proven LAM in the setting of polycystic lung disease.  She also has some associated obstructive lung disease by spirometry.  She is not currently on bronchodilators, has been on Symbicort in the past.  She has had stable pulmonary function testing, most recently 11/13/2017.  Her last chest imaging was high-resolution CT scan of the chest 05/18/2018 which I have reviewed.  This shows a slight increase in the number and size of her numerous thin-walled cysts without any evidence of new interstitial disease or frank honeycomb change.  She has been managed on low-dose sirolimus 1 mg daily, most recent labs 11/18/2017.   She was seen here with sinus sx and green sputum in early October > treated with abx. She tells me that she has some R mid back and subscapular pain, worse recently with the cold weather. She can get some SOB with climbing hills or carrying things. She doesn't want the flu shot.    Review of Systems As per HPI     Objective:   Physical Exam Vitals:   05/20/18 0909  BP: 120/78  Pulse: 80  SpO2: 96%  Weight: 224 lb 12.8 oz (102 kg)  Height: 5\' 7"  (1.702 m)   Body mass index is 35.21 kg/m.  Gen: Pleasant, obese woman, in no distress,  normal affect  ENT: No lesions,  mouth clear,  oropharynx clear, no postnasal drip  Neck: No JVD, no stridor  Lungs: No use of accessory muscles, distant, clear without rales or rhonchi  Cardiovascular: RRR, heart sounds normal, no murmur or gallops, no peripheral edema  Musculoskeletal: No deformities, no cyanosis or clubbing  Neuro: alert, non focal  Skin: Warm, no lesions or  rashes    CT Chest 06/03/17 --  COMPARISON:  Prior examinations dating back to 06/30/2014.  FINDINGS: Cardiovascular: Vascular structures are unremarkable. Heart size within normal limits. No pericardial effusion.  Mediastinum/Nodes: No pathologically enlarged mediastinal or axillary lymph nodes. Hilar regions are difficult to definitively evaluate without IV contrast. Esophagus is grossly unremarkable.  Lungs/Pleura: Cystic changes are seen throughout the lungs. Mildly lobulated nodule in the right middle lobe measures 8 x 10 mm, stable from prior exams when differences in slice collimation are considered. Postoperative scarring in the right hemithorax. No pleural fluid. Airway is unremarkable.  Upper Abdomen: Visualized portions of the liver, adrenal glands, kidneys, spleen, pancreas, stomach and bowel are grossly unremarkable.  Musculoskeletal: Degenerative changes in the spine. No worrisome lytic or sclerotic lesions.  IMPRESSION: 1. Cystic lung disease, consistent with the known diagnosis of lymphangioleiomyomatosis. 2. Right middle lobe nodule, stable and most likely benign   Assessment & Plan:    Lymphangioleiomyomatosis [facility]) Her most recent high-resolution CT scan shows that her widespread cystic disease is slightly more prominent, slightly enlarged.  Based on this I think we need to increase her sirolimus.  We have been maintaining her at subtherapeutic levels given her clinical stability.  We will change to 1 mg twice a day.  Check CBC and BMP today to ensure stability, repeat in 6 weeks after the medication is been increased.  Check a sirolimus level at that  time as well.  Mild intermittent asthma Associated with her LAM.  Managing currently with albuterol as needed, rare usage.  Pulmonary nodules Stable in size on her most recent CT, have been stable for approaching 2 years and are likely benign.  Baltazar Apo, MD, PhD 05/20/2018, 9:46 AM [facility] [phone] or if no answer [phone]

## 2018-05-20 NOTE — Assessment & Plan Note (Signed)
Associated with her LAM.  Managing currently with albuterol as needed, rare usage.

## 2018-05-20 NOTE — Assessment & Plan Note (Signed)
Her most recent high-resolution CT scan shows that her widespread cystic disease is slightly more prominent, slightly enlarged.  Based on this I think we need to increase her sirolimus.  We have been maintaining her at subtherapeutic levels given her clinical stability.  We will change to 1 mg twice a day.  Check CBC and BMP today to ensure stability, repeat in 6 weeks after the medication is been increased.  Check a sirolimus level at that time as well.

## 2018-05-20 NOTE — Assessment & Plan Note (Signed)
Stable in size on her most recent CT, have been stable for approaching 2 years and are likely benign.

## 2018-05-20 NOTE — Patient Instructions (Addendum)
We will increase your sirolimus to 1 mg twice a day.  Please call us if you have any side effects with this change Blood work today Keep your albuterol available to use 2 puffs if you needed for shortness of breath, chest tightness, wheezing. You would probably benefit from getting the flu shot each fall.  Let us know if you would like to get this. Follow-up with Dr. Lamonte Sakai or APP in 6 weeks with lab work (CBC, BMP)

## 2018-06-15 ENCOUNTER — Ambulatory Visit: Payer: 59 | Admitting: Family

## 2018-06-15 ENCOUNTER — Encounter: Payer: Self-pay | Admitting: Family

## 2018-06-15 VITALS — BP 137/89 | HR 75 | Temp 97.9°F | Resp 16 | Ht 67.0 in | Wt 223.0 lb

## 2018-06-15 DIAGNOSIS — R110 Nausea: Secondary | ICD-10-CM | POA: Diagnosis not present

## 2018-06-15 DIAGNOSIS — H8110 Benign paroxysmal vertigo, unspecified ear: Secondary | ICD-10-CM

## 2018-06-15 MED ORDER — MECLIZINE HCL 25 MG PO TABS: 25.0000 mg | ORAL_TABLET | Freq: Three times a day (TID) | ORAL | 0 refills | Status: DC | PRN

## 2018-06-15 MED ORDER — ONDANSETRON 4 MG PO TBDP: 4.0000 mg | ORAL_TABLET | Freq: Three times a day (TID) | ORAL | 0 refills | Status: DC | PRN

## 2018-06-15 MED ORDER — ONDANSETRON HCL 4 MG/2ML IJ SOLN
4.0000 mg | Freq: Once | INTRAMUSCULAR | Status: AC
  Administered 2018-06-15: 4 mg via INTRAMUSCULAR

## 2018-06-15 NOTE — Progress Notes (Signed)
Subjective:    Patient ID: Brenda Williams, female    DOB: [birth year], 37 y.o.   MRN: [id number]  HPI  Brenda Williams is a 37 yr old female who presents today with chief complaint of dizziness.  Notes that her sirolimus was recently increased from once daily to bid.  Notes that last night around midnight "I was super dizzy and the room was spinning."  Had associated nausea.   Vomited once today.  Reports "pressure" headache in the back of her head which she attributes to "tension."  Denies fever.  Mild diarrhea yesterday before the dizziness started.  Denies dysuria/frequency/hematuria.  Denies cough/cold symptoms.  Denies previous hx of vertigo. Continues to have dizziness which is worse with head movement.    Review of Systems See HPI  Past Medical History:  Diagnosis Date  . Abnormal Pap smear 2006   leep  . Anxiety   . Arthritis    osteoarthritis  . Back pain   . Complication of anesthesia    2014 [facility]c [facility], HAD ??  SEIZURE/ SHAKING  . Constipation   . Depression   . Dyspnea   . GERD (gastroesophageal reflux disease)   . Headache   . Herpes    never had outbreak. pos per blood, doesn't know which type  . History of chicken pox   . Joint pain   . Lactose intolerance   . Lymphangiomatosis   . Polycystic ovarian syndrome   . Prediabetes   . Shortness of breath dyspnea    W/ EXERTION   . SVD (spontaneous vaginal delivery)    x 1  . Varicose vein of leg   . Vitamin D deficiency      Social History   Socioeconomic History  . Marital status: Married    Spouse name: Brenda Williams  . Number of children: Not on file  . Years of education: Not on file  . Highest education level: Not on file  Occupational History  . Occupation: account coordinator  Social Needs  . Financial resource strain: Not on file  . Food insecurity:    Worry: Not on file    Inability: Not on file  . Transportation needs:    Medical: Not on file    Non-medical: Not on file  Tobacco Use  .  Smoking status: Never Smoker  . Smokeless tobacco: Never Used  Substance and Sexual Activity  . Alcohol use: Yes    Alcohol/week: 0.0 standard drinks    Comment: OCC  . Drug use: No  . Sexual activity: Yes    Birth control/protection: None  Lifestyle  . Physical activity:    Days per week: Not on file    Minutes per session: Not on file  . Stress: Not on file  Relationships  . Social connections:    Talks on phone: Not on file    Gets together: Not on file    Attends religious service: Not on file    Active member of club or organization: Not on file    Attends meetings of clubs or organizations: Not on file    Relationship status: Not on file  . Intimate partner violence:    Fear of current or ex partner: Not on file    Emotionally abused: Not on file    Physically abused: Not on file    Forced sexual activity: Not on file  Other Topics Concern  . Not on file  Social History Narrative  . Not on file  Past Surgical History:  Procedure Laterality Date  . DILATION AND EVACUATION N/A 09/14/2012   Procedure: DILATATION AND EVACUATION;  Surgeon: Allena Katz, MD;  Location: [facility];  Service: Gynecology;  Laterality: N/A;  . HYSTEROSCOPY  2009   uterine polyp  . IVF Retrival  2010, 2011  . KNEE SURGERY  1998   right knee  . LEEP  2006  . LUNG BIOPSY Right 12/28/2015   Procedure: RIGHT LUNG BIOPSY;  Surgeon: Ivin Poot, MD;  Location: [facility];  Service: Thoracic;  Laterality: Right;  . SPHINCTEROTOMY  2010  . uterine polyp removal    . VIDEO ASSISTED THORACOSCOPY Right 12/28/2015   Procedure: RIGHT VIDEO ASSISTED THORACOSCOPY;  Surgeon: Ivin Poot, MD;  Location: [facility];  Service: Thoracic;  Laterality: Right;    Family History  Problem Relation Age of Onset  . Diabetes Maternal Grandmother   . Heart disease Maternal Grandmother   . Hypertension Maternal Grandfather   . Cancer Maternal Grandfather 70       bone, colon   . Diabetes Maternal Grandfather     . Diabetes Mother   . Cancer Mother 66       breast cancer  . Hyperlipidemia Mother   . Depression Mother   . Obesity Mother   . Hypertension Father   . Hyperlipidemia Father   . Thyroid disease Father   . Alcohol abuse Father   . Drug abuse Father   . Obesity Father   . Anesthesia problems Neg Hx     Allergies  Allergen Reactions  . Fentanyl Itching    Benadryl effective for itching.    Current Outpatient Medications on File Prior to Visit  Medication Sig Dispense Refill  . albuterol (PROVENTIL HFA;VENTOLIN HFA) 108 (90 Base) MCG/ACT inhaler Inhale 2 puffs into the lungs every 4 (four) hours as needed for wheezing or shortness of breath. 1 Inhaler 5  . phentermine (ADIPEX-P) 37.5 MG tablet Take 1 tablet (37.5 mg total) by mouth daily before breakfast. 30 tablet 2  . sirolimus (RAPAMUNE) 1 MG tablet Take 1 tablet (1 mg total) by mouth 2 (two) times daily. 180 tablet 1   No current facility-administered medications on file prior to visit.     BP 137/89 (BP Location: Right Arm, Patient Position: Sitting, Cuff Size: Large)   Pulse 75   Temp 97.9 F (36.6 C) (Oral)   Resp 16   Ht 5\' 7"  (1.702 m)   Wt 223 lb (101.2 kg)   LMP 05/27/2018   SpO2 98%   BMI 34.93 kg/m       Objective:   Physical Exam  Constitutional: She is oriented to person, place, and time. She appears well-developed and well-nourished. No distress.  Cardiovascular: Normal rate and regular rhythm.  No murmur heard. Pulmonary/Chest: Effort normal and breath sounds normal. No stridor. No respiratory distress.  Musculoskeletal: She exhibits no edema.  Neurological: She is alert and oriented to person, place, and time.  + nystagmus noted when pt was reclined  Skin: Skin is warm and dry.  Psychiatric: She has a normal mood and affect. Her behavior is normal.          Assessment & Plan:  Benign Positional Vertigo- New. symptoms are most consistent with vertigo. She had such severe dizziness and  nystagmus when I attempted to recline her that I did not complete a formal Dix-Hallpike maneuver.  She will be given Zofran 4 mg IM today.  This will be  followed by as needed oral Zofran at home and as needed meclizine.  She is advised to call if symptoms worsen or if symptoms are not improved in 3 to 4 days.

## 2018-06-15 NOTE — Addendum Note (Signed)
Addended by: Jiles Prows on: 06/15/2018 10:00 AM   Modules accepted: Orders

## 2018-06-15 NOTE — Patient Instructions (Signed)
Please begin meclizine as needed for dizziness. You may use zofran as needed for nausea. Call if symptoms worsen or if symptoms are not improved in 3-4 days.    Vertigo Vertigo means that you feel like you are moving when you are not. Vertigo can also make you feel like things around you are moving when they are not. This feeling can come and go at any time. Vertigo often goes away on its own. Follow these instructions at home:  Avoid making fast movements.  Avoid driving.  Avoid using heavy machinery.  Avoid doing any task or activity that might cause danger to you or other people if you would have a vertigo attack while you are doing it.  Sit down right away if you feel dizzy or have trouble with your balance.  Take over-the-counter and prescription medicines only as told by your doctor.  Follow instructions from your doctor about which positions or movements you should avoid.  Drink enough fluid to keep your pee (urine) clear or pale yellow.  Keep all follow-up visits as told by your doctor. This is important. Contact a doctor if:  Medicine does not help your vertigo.  You have a fever.  Your problems get worse or you have new symptoms.  Your family or friends see changes in your behavior.  You feel sick to your stomach (nauseous) or you throw up (vomit).  You have a "pins and needles" feeling or you are numb in part of your body. Get help right away if:  You have trouble moving or talking.  You are always dizzy.  You pass out (faint).  You get very bad headaches.  You feel weak or have trouble using your hands, arms, or legs.  You have changes in your hearing.  You have changes in your seeing (vision).  You get a stiff neck.  Bright light starts to bother you. This information is not intended to replace advice given to you by your health care provider. Make sure you discuss any questions you have with your health care provider. Document Released:  04/08/2008 Document Revised: 12/06/2015 Document Reviewed: 10/23/2014 Elsevier Interactive Patient Education  [employer].

## 2018-06-16 ENCOUNTER — Ambulatory Visit: Payer: Self-pay | Admitting: Family

## 2018-07-01 ENCOUNTER — Ambulatory Visit: Payer: Self-pay | Admitting: Emergency Medicine

## 2018-07-21 ENCOUNTER — Ambulatory Visit: Payer: Self-pay | Admitting: Emergency Medicine

## 2018-07-26 ENCOUNTER — Ambulatory Visit: Payer: BLUE CROSS/BLUE SHIELD | Admitting: Emergency Medicine

## 2018-07-26 ENCOUNTER — Encounter: Payer: Self-pay | Admitting: Emergency Medicine

## 2018-07-26 DIAGNOSIS — J8481 Lymphangioleiomyomatosis: Secondary | ICD-10-CM | POA: Diagnosis not present

## 2018-07-26 DIAGNOSIS — J4520 Mild intermittent asthma, uncomplicated: Secondary | ICD-10-CM | POA: Diagnosis not present

## 2018-07-26 DIAGNOSIS — R0602 Shortness of breath: Secondary | ICD-10-CM | POA: Diagnosis not present

## 2018-07-26 LAB — CBC WITH DIFFERENTIAL/PLATELET
BASOS PCT: 0.5 % (ref 0.0–3.0)
Basophils Absolute: 0 10*3/uL (ref 0.0–0.1)
Eosinophils Absolute: 0 10*3/uL (ref 0.0–0.7)
Eosinophils Relative: 0.4 % (ref 0.0–5.0)
HCT: 39.8 % (ref 36.0–46.0)
Hemoglobin: 13.4 g/dL (ref 12.0–15.0)
Lymphocytes Relative: 38 % (ref 12.0–46.0)
Lymphs Abs: 1.7 10*3/uL (ref 0.7–4.0)
MCHC: 33.8 g/dL (ref 30.0–36.0)
MCV: 88.2 fl (ref 78.0–100.0)
Monocytes Absolute: 0.2 10*3/uL (ref 0.1–1.0)
Monocytes Relative: 5.1 % (ref 3.0–12.0)
NEUTROS ABS: 2.6 10*3/uL (ref 1.4–7.7)
Neutrophils Relative %: 56 % (ref 43.0–77.0)
PLATELETS: 296 10*3/uL (ref 150.0–400.0)
RBC: 4.51 Mil/uL (ref 3.87–5.11)
RDW: 15.2 % (ref 11.5–15.5)
WBC: 4.6 10*3/uL (ref 4.0–10.5)

## 2018-07-26 LAB — COMPREHENSIVE METABOLIC PANEL
ALT: 10 U/L (ref 0–35)
AST: 11 U/L (ref 0–37)
Albumin: 4.1 g/dL (ref 3.5–5.2)
Alkaline Phosphatase: 49 U/L (ref 39–117)
BUN: 10 mg/dL (ref 6–23)
CO2: 25 mEq/L (ref 19–32)
Calcium: 9.5 mg/dL (ref 8.4–10.5)
Chloride: 106 mEq/L (ref 96–112)
Creatinine, Ser: 0.76 mg/dL (ref 0.40–1.20)
GFR: 108.88 mL/min (ref >60.00)
Glucose, Bld: 77 mg/dL (ref 70–99)
Potassium: 3.5 mEq/L (ref 3.5–5.1)
SODIUM: 137 meq/L (ref 135–145)
Total Bilirubin: 0.5 mg/dL (ref 0.2–1.2)
Total Protein: 7.1 g/dL (ref 6.0–8.3)

## 2018-07-26 NOTE — Addendum Note (Signed)
Addended by: Desmond Dike C on: 07/26/2018 10:24 AM   Modules accepted: Orders

## 2018-07-26 NOTE — Addendum Note (Signed)
Addended by: Suzzanne Cloud E on: 07/26/2018 10:25 AM   Modules accepted: Orders

## 2018-07-26 NOTE — Progress Notes (Signed)
Subjective:    Patient ID: Brenda Williams, female    DOB: Nov 17, 1980, 38 y.o.   MRN: [id number]  HPI  ROV 05/20/18 --Brenda Williams is 38 with a history of biopsy-proven LAM in the setting of polycystic lung disease.  She also has some associated obstructive lung disease by spirometry.  She is not currently on bronchodilators, has been on Symbicort in the past.  She has had stable pulmonary function testing, most recently 11/13/2017.  Her last chest imaging was high-resolution CT scan of the chest 05/18/2018 which I have reviewed.  This shows a slight increase in the number and size of her numerous thin-walled cysts without any evidence of new interstitial disease or frank honeycomb change.  She has been managed on low-dose sirolimus 1 mg daily, most recent labs 11/18/2017.   She was seen here with sinus sx and green sputum in early October > treated with abx. She tells me that she has some R mid back and subscapular pain, worse recently with the cold weather. She can get some SOB with climbing hills or carrying things. She doesn't want the flu shot.   ROV 07/26/18 --Brenda Williams is 38 and has LAM that was discovered after biopsy to evaluate polycystic lung disease.  She also has associated obstructive lung disease, off symbicort. Rare albuterol use.  We have been following her physiology and also her imaging.  Based on her most recent imaging I increased her sirolimus to 1 mg twice a day.  Labs in November were reassuring.  She returns today reporting that her breathing has been stable. She has been dealing with some vertigo - ? Possible side effect. She will need repeat Ct chest in ~ June   Review of Systems As per HPI     Objective:   Physical Exam Vitals:   07/26/18 0944  BP: 92/60  Pulse: 78  SpO2: 100%  Weight: 221 lb 9.6 oz (100.5 kg)  Height: 5' 6.5" (1.689 m)   Body mass index is 35.23 kg/m.  Gen: Pleasant, obese woman, in no distress,  normal affect  ENT: No lesions,  mouth clear,   oropharynx clear, no postnasal drip  Neck: No JVD, no stridor  Lungs: No use of accessory muscles, distant, clear without rales or rhonchi  Cardiovascular: RRR, heart sounds normal, no murmur or gallops, no peripheral edema  Musculoskeletal: No deformities, no cyanosis or clubbing  Neuro: alert, non focal  Skin: Warm, no lesions or rashes    CT Chest 06/03/17 --  COMPARISON:  Prior examinations dating back to 06/30/2014.  FINDINGS: Cardiovascular: Vascular structures are unremarkable. Heart size within normal limits. No pericardial effusion.  Mediastinum/Nodes: No pathologically enlarged mediastinal or axillary lymph nodes. Hilar regions are difficult to definitively evaluate without IV contrast. Esophagus is grossly unremarkable.  Lungs/Pleura: Cystic changes are seen throughout the lungs. Mildly lobulated nodule in the right middle lobe measures 8 x 10 mm, stable from prior exams when differences in slice collimation are considered. Postoperative scarring in the right hemithorax. No pleural fluid. Airway is unremarkable.  Upper Abdomen: Visualized portions of the liver, adrenal glands, kidneys, spleen, pancreas, stomach and bowel are grossly unremarkable.  Musculoskeletal: Degenerative changes in the spine. No worrisome lytic or sclerotic lesions.  IMPRESSION: 1. Cystic lung disease, consistent with the known diagnosis of lymphangioleiomyomatosis. 2. Right middle lobe nodule, stable and most likely benign   Assessment & Plan:    Lymphangioleiomyomatosis ([place]) Please continue sirolimus 1 mg twice a day. Lab work today.  We will likely repeat your CT scan of the chest in June 2020 to assess for interval change. Keep up the good work with your exercise routine Follow with Dr Lamonte Sakai in 6 months or sooner if you have any problems  Mild intermittent asthma Stable currently off maintenance therapy.  Rare albuterol use.  Continue same plan  Baltazar Apo, MD,  PhD 07/26/2018, 10:17 AM [facility] [phone] or if no answer [phone]

## 2018-07-26 NOTE — Assessment & Plan Note (Signed)
Please continue sirolimus 1 mg twice a day. Lab work today. We will likely repeat your CT scan of the chest in June 2020 to assess for interval change. Keep up the good work with your exercise routine Follow with Dr Lamonte Sakai in 6 months or sooner if you have any problems

## 2018-07-26 NOTE — Addendum Note (Signed)
Addended by: Desmond Dike C on: 07/26/2018 10:26 AM   Modules accepted: Orders

## 2018-07-26 NOTE — Assessment & Plan Note (Signed)
Stable currently off maintenance therapy.  Rare albuterol use.  Continue same plan

## 2018-07-26 NOTE — Patient Instructions (Signed)
Please continue sirolimus 1 mg twice a day. Lab work today. We will likely repeat your CT scan of the chest in June 2020 to assess for interval change. Keep up the good work with your exercise routine Follow with Dr Lamonte Sakai in 6 months or sooner if you have any problems

## 2018-07-27 LAB — SIROLIMUS LEVEL: Sirolimus (Rapamycin): <1 mcg/L — CL (ref 3.0–18.0)

## 2018-07-28 NOTE — Progress Notes (Signed)
LMTCB

## 2018-08-09 ENCOUNTER — Ambulatory Visit (INDEPENDENT_AMBULATORY_CARE_PROVIDER_SITE_OTHER): Payer: BLUE CROSS/BLUE SHIELD | Admitting: Family Medicine

## 2018-08-09 ENCOUNTER — Encounter: Payer: Self-pay | Admitting: Family Medicine

## 2018-08-09 ENCOUNTER — Other Ambulatory Visit: Payer: Self-pay | Admitting: Family Medicine

## 2018-08-09 VITALS — BP 120/86 | HR 78 | Temp 98.6°F | Ht 67.0 in | Wt 224.4 lb

## 2018-08-09 DIAGNOSIS — H8110 Benign paroxysmal vertigo, unspecified ear: Secondary | ICD-10-CM

## 2018-08-09 DIAGNOSIS — R413 Other amnesia: Secondary | ICD-10-CM

## 2018-08-09 DIAGNOSIS — H538 Other visual disturbances: Secondary | ICD-10-CM

## 2018-08-09 DIAGNOSIS — E559 Vitamin D deficiency, unspecified: Secondary | ICD-10-CM

## 2018-08-09 LAB — COMPREHENSIVE METABOLIC PANEL
ALT: 11 U/L (ref 0–35)
AST: 10 U/L (ref 0–37)
Albumin: 4.2 g/dL (ref 3.5–5.2)
Alkaline Phosphatase: 56 U/L (ref 39–117)
BUN: 8 mg/dL (ref 6–23)
CO2: 26 mEq/L (ref 19–32)
Calcium: 9.9 mg/dL (ref 8.4–10.5)
Chloride: 103 mEq/L (ref 96–112)
Creatinine, Ser: 0.76 mg/dL (ref 0.40–1.20)
GFR: 102.42 mL/min (ref >60.00)
Glucose, Bld: 118 mg/dL — ABNORMAL HIGH (ref 70–99)
Potassium: 3.8 mEq/L (ref 3.5–5.1)
Sodium: 139 mEq/L (ref 135–145)
Total Bilirubin: 0.5 mg/dL (ref 0.2–1.2)
Total Protein: 7 g/dL (ref 6.0–8.3)

## 2018-08-09 LAB — CBC
HCT: 41.5 % (ref 36.0–46.0)
HEMOGLOBIN: 13.5 g/dL (ref 12.0–15.0)
MCHC: 32.5 g/dL (ref 30.0–36.0)
MCV: 89.6 fl (ref 78.0–100.0)
Platelets: 325 10*3/uL (ref 150.0–400.0)
RBC: 4.64 Mil/uL (ref 3.87–5.11)
RDW: 15.3 % (ref 11.5–15.5)
WBC: 4.1 10*3/uL (ref 4.0–10.5)

## 2018-08-09 LAB — VITAMIN D 25 HYDROXY (VIT D DEFICIENCY, FRACTURES): VITD: 15.05 ng/mL — ABNORMAL LOW (ref 30.00–100.00)

## 2018-08-09 LAB — TSH: TSH: 0.51 u[IU]/mL (ref 0.35–4.50)

## 2018-08-09 LAB — VITAMIN B12: Vitamin B-12: 312 pg/mL (ref 211–911)

## 2018-08-09 MED ORDER — VITAMIN D (ERGOCALCIFEROL) 1.25 MG (50000 UNIT) PO CAPS: 50000.0000 [IU] | ORAL_CAPSULE | ORAL | 0 refills | Status: DC

## 2018-08-09 NOTE — Patient Instructions (Addendum)
Give [place] 2-3 business days to get the results of your labs back.   Let me know if anything changes.  Set up an appointment with an eye doctor.  Check out YouTube if these exercises don't make sense.   Let us know if you need anything. How to Perform the Epley Maneuver The Epley maneuver is an exercise that relieves symptoms of vertigo. Vertigo is the feeling that you or your surroundings are moving when they are not. When you feel vertigo, you may feel like the room is spinning and have trouble walking. Dizziness is a little different than vertigo. When you are dizzy, you may feel unsteady or light-headed. You can do this maneuver at home whenever you have symptoms of vertigo. You can do it up to 3 times a day until your symptoms go away. Even though the Epley maneuver may relieve your vertigo for a few weeks, it is possible that your symptoms will return. This maneuver relieves vertigo, but it does not relieve dizziness. What are the risks? If it is done correctly, the Epley maneuver is considered safe. Sometimes it can lead to dizziness or nausea that goes away after a short time. If you develop other symptoms, such as changes in vision, weakness, or numbness, stop doing the maneuver and call your health care provider. How to perform the Epley maneuver 1. Sit on the edge of a bed or table with your back straight and your legs extended or hanging over the edge of the bed or table. 2. Turn your head halfway toward the affected ear or side. 3. Lie backward quickly with your head turned until you are lying flat on your back. You may want to position a pillow under your shoulders. 4. Hold this position for 30 seconds. You may experience an attack of vertigo. This is normal. 5. Turn your head to the opposite direction until your unaffected ear is facing the floor. 6. Hold this position for 30 seconds. You may experience an attack of vertigo. This is normal. Hold this position until the vertigo  stops. 7. Turn your whole body to the same side as your head. Hold for another 30 seconds. 8. Sit back up. You can repeat this exercise up to 3 times a day. Follow these instructions at home:  After doing the Epley maneuver, you can return to your normal activities.  Ask your health care provider if there is anything you should do at home to prevent vertigo. He or she may recommend that you: ? Keep your head raised (elevated) with two or more pillows while you sleep. ? Do not sleep on the side of your affected ear. ? Get up slowly from bed. ? Avoid sudden movements during the day. ? Avoid extreme head movement, like looking up or bending over. Contact a health care provider if:  Your vertigo gets worse.  You have other symptoms, including: ? Nausea. ? Vomiting. ? Headache. Get help right away if:  You have vision changes.  You have a severe or worsening headache or neck pain.  You cannot stop vomiting.  You have new numbness or weakness in any part of your body. Summary  Vertigo is the feeling that you or your surroundings are moving when they are not.  The Epley maneuver is an exercise that relieves symptoms of vertigo.  If the Epley maneuver is done correctly, it is considered safe. You can do it up to 3 times a day. This information is not intended to replace advice  given to you by your health care provider. Make sure you discuss any questions you have with your health care provider. Document Released: 07/05/2013 Document Revised: 05/20/2016 Document Reviewed: 05/20/2016 Elsevier Interactive Patient Education  2019 [employer].

## 2018-08-09 NOTE — Progress Notes (Signed)
Chief Complaint  Patient presents with  . Dizziness  . Blurred Vision    Subjective: Patient is a 38 y.o. female here for follow-up vertigo.  This is been going on for around 2 months.  It is better overall but she still has it when she turns her head.  It last for short period of time.  She was given stretches/movements to do but was not very compliant with them.  Denies difficulty swallowing, or trouble with speech, weakness, numbness/tingling.  She has been having blurred vision as well out of the left eye.  She does wear readers.  She is due for an eye exam.  She states she is been having memory issues as well.  She will be walking at the gym and cannot remember how me lap she is walked.  She is doing well in school and with her activities of daily living otherwise.  ROS: Neuro: +vertigo Eyes: As noted in HPI  Past Medical History:  Diagnosis Date  . Abnormal Pap smear 2006   leep  . Anxiety   . Arthritis    osteoarthritis  . Back pain   . Complication of anesthesia    [facility], HAD ??  SEIZURE/ SHAKING  . Constipation   . Depression   . Dyspnea   . GERD (gastroesophageal reflux disease)   . Headache   . Herpes    never had outbreak. pos per blood, doesn't know which type  . History of chicken pox   . Joint pain   . Lactose intolerance   . Lymphangiomatosis   . Polycystic ovarian syndrome   . Prediabetes   . Shortness of breath dyspnea    W/ EXERTION   . SVD (spontaneous vaginal delivery)    x 1  . Varicose vein of leg   . Vitamin D deficiency     Objective: BP 120/86 (BP Location: Left Arm, Patient Position: Sitting, Cuff Size: Normal)   Pulse 78   Temp 98.6 F (37 C) (Oral)   Ht 5\' 7"  (1.702 m)   Wt 224 lb 6 oz (101.8 kg)   SpO2 97%   BMI 35.14 kg/m  General: Awake, appears stated age HEENT: MMM, EOMi, PERLLA Heart: RRR, no murmurs Lungs: CTAB, no rales, wheezes or rhonchi. No accessory muscle use Neuro: DTRs equal and symmetric  throughout, no cerebellar signs, no clonus; positive Dix-Hallpike on the right Psych: Age appropriate judgment and insight, normal affect and mood  Assessment and Plan: Benign paroxysmal positional vertigo, unspecified laterality  Memory difficulty - Plan: CBC, Comprehensive metabolic panel, TSH, Z56, Vitamin D (25 hydroxy)  Blurred vision, left eye  Epley maneuver information given.  If no improvement, will let us know and we will get set up with vestibular rehab. For the memory difficulty, will check metabolic causes.  She does not feel it is bothersome enough to have a formal evaluation.  I did encourage her to get at least 7 hours of sleep nightly. She will schedule an appointment with her eye provider. Follow-up as needed for these above issues.  I will see her in 6 months for a physical. The patient voiced understanding and agreement to the plan.  [place], DO 08/09/18  12:12 PM

## 2018-09-10 NOTE — Telephone Encounter (Signed)
Dr. Byrum please advise 

## 2018-09-13 NOTE — Telephone Encounter (Signed)
mychart message was sent by Brenda Williams 09/10/2018 in regards to renewal of handicap placard. Below is the message:  Good Morning,  I just noticed my temporary handicap sticker is about to expire and it has really been helpful for me for getting around campus.Is it possible for me to get another one.I graduate in May 2021.Is it possible for me to get one to cover until then?( cause after that I shouldn't need another one since I won't be at [employer] anymore)  Dr. Lamonte Sakai, please advise on this for Brenda Williams as she has sent another message to check on status of renewal of placard application. Thanks!

## 2018-09-13 NOTE — Telephone Encounter (Signed)
OK with me to renew the handicap parking sticker

## 2018-09-15 ENCOUNTER — Telehealth: Payer: Self-pay | Admitting: Family Medicine

## 2018-09-15 ENCOUNTER — Encounter: Payer: Self-pay | Admitting: Family Medicine

## 2018-09-15 NOTE — Telephone Encounter (Signed)
Called left message to call back 

## 2018-09-15 NOTE — Telephone Encounter (Signed)
Please pull her immunization database info. Ty.

## 2018-09-15 NOTE — Telephone Encounter (Signed)
Pt came in office stating that her job needs to have her immunizations updated, pt is wanting to know if provider can see which immunization is due. Please call  pt tel [phone]. Please advise. (so pt can schedule a nurse visit for her shots to be done)

## 2018-09-15 NOTE — Telephone Encounter (Signed)
On counter for review.

## 2018-09-15 NOTE — Telephone Encounter (Signed)
2019 she did not show immunity to mumps or polio and received a dose of the MMR and IPV vaccine. She did show immunity to varicella (chicken pox). UTD with tetanus. Sometimes schools require proof of immunity to hepatitis B which we can do as a lab draw. I do not believe that she requires, other than a yearly flu shot, any other immunizations unless she has a specific requirement. TY.

## 2018-09-16 DIAGNOSIS — Z23 Encounter for immunization: Secondary | ICD-10-CM | POA: Diagnosis not present

## 2018-09-16 NOTE — Telephone Encounter (Signed)
Called the patient informed of PCP instructions regarding immunizations. She has taken care of at [facility] today and everything is updated now.

## 2018-11-08 ENCOUNTER — Other Ambulatory Visit: Payer: Self-pay

## 2018-11-08 ENCOUNTER — Other Ambulatory Visit: Payer: Self-pay | Admitting: Family Medicine

## 2018-11-08 ENCOUNTER — Other Ambulatory Visit (INDEPENDENT_AMBULATORY_CARE_PROVIDER_SITE_OTHER): Payer: BLUE CROSS/BLUE SHIELD

## 2018-11-08 DIAGNOSIS — E559 Vitamin D deficiency, unspecified: Secondary | ICD-10-CM | POA: Diagnosis not present

## 2018-11-08 LAB — VITAMIN D 25 HYDROXY (VIT D DEFICIENCY, FRACTURES): VITD: 28.15 ng/mL — ABNORMAL LOW (ref 30.00–100.00)

## 2018-11-08 MED ORDER — VITAMIN D (ERGOCALCIFEROL) 1.25 MG (50000 UNIT) PO CAPS: 50000.0000 [IU] | ORAL_CAPSULE | ORAL | 0 refills | Status: DC

## 2018-12-29 ENCOUNTER — Other Ambulatory Visit: Payer: BLUE CROSS/BLUE SHIELD

## 2019-01-03 ENCOUNTER — Telehealth: Payer: Self-pay | Admitting: *Deleted

## 2019-01-03 NOTE — Telephone Encounter (Signed)

## 2019-01-04 ENCOUNTER — Other Ambulatory Visit: Payer: Self-pay

## 2019-01-04 ENCOUNTER — Ambulatory Visit (INDEPENDENT_AMBULATORY_CARE_PROVIDER_SITE_OTHER)
Admission: RE | Admit: 2019-01-04 | Discharge: 2019-01-04 | Disposition: A | Discharge status: Home / Self Care | Payer: BC Managed Care – PPO | Source: Ambulatory Visit | Attending: Emergency Medicine | Admitting: Emergency Medicine

## 2019-01-04 DIAGNOSIS — R918 Other nonspecific abnormal finding of lung field: Secondary | ICD-10-CM | POA: Diagnosis not present

## 2019-01-04 DIAGNOSIS — J8481 Lymphangioleiomyomatosis: Secondary | ICD-10-CM | POA: Diagnosis not present

## 2019-02-03 DIAGNOSIS — Z01419 Encounter for gynecological examination (general) (routine) without abnormal findings: Secondary | ICD-10-CM | POA: Diagnosis not present

## 2019-02-03 DIAGNOSIS — R102 Pelvic and perineal pain: Secondary | ICD-10-CM | POA: Diagnosis not present

## 2019-02-03 DIAGNOSIS — Z1231 Encounter for screening mammogram for malignant neoplasm of breast: Secondary | ICD-10-CM | POA: Diagnosis not present

## 2019-02-03 DIAGNOSIS — Z6836 Body mass index (BMI) 36.0-36.9, adult: Secondary | ICD-10-CM | POA: Diagnosis not present

## 2019-02-08 ENCOUNTER — Other Ambulatory Visit: Payer: BLUE CROSS/BLUE SHIELD

## 2019-02-10 DIAGNOSIS — R102 Pelvic and perineal pain: Secondary | ICD-10-CM | POA: Diagnosis not present

## 2019-05-01 IMAGING — CT CT CHEST HIGH RESOLUTION W/O CM
2 of 5 series · 15 of 36 positions shown, 18 images · non-contrast
Comparison: Prior examinations dating back to 06/30/2014.

CLINICAL DATA: Worsening shortness of breath with exertion.
Pulmonary cysts.

EXAM:
CT CHEST WITHOUT CONTRAST
TECHNIQUE: Multidetector CT imaging of the chest was performed following the
standard protocol without intravenous contrast. High resolution
imaging of the lungs, as well as inspiratory and expiratory imaging,
was performed.

[Series 2: high resolution · axial · 0.68mm/px · z∈[-287,-21]mm · 12 of 147 slices shown, 15 images]
[im 7/147  mediastinal]
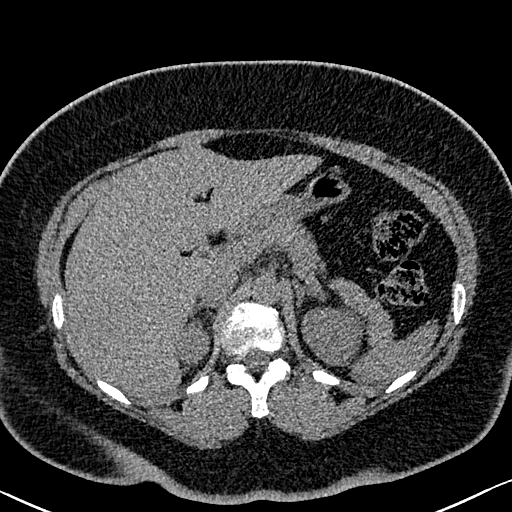
[im 7/147  lung]
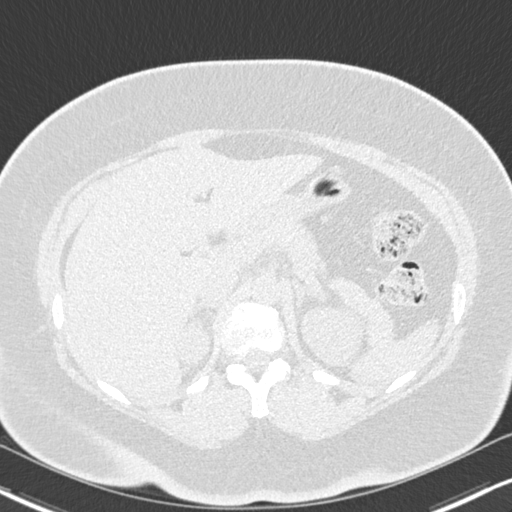
[im 20/147  lung]
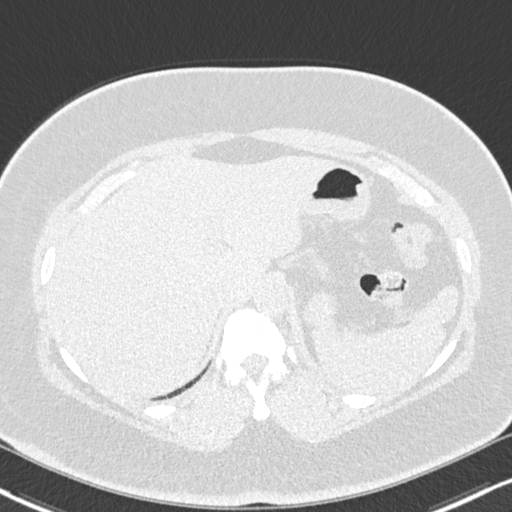
[im 34/147  lung]
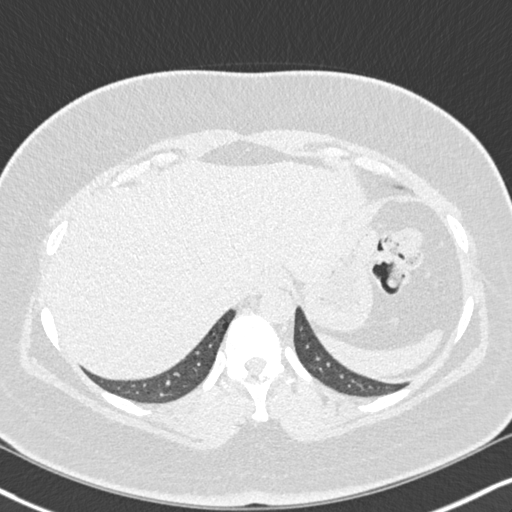
[im 47/147  lung]
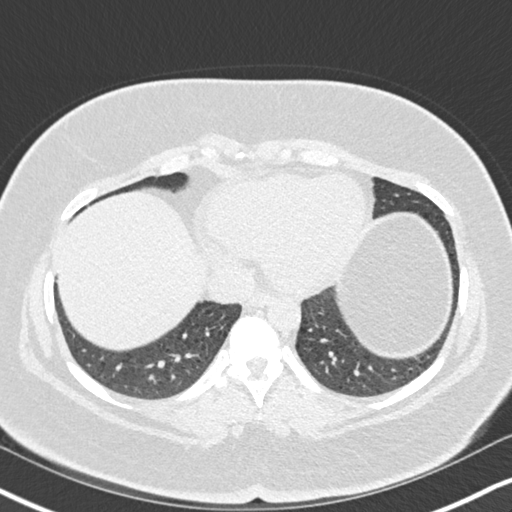
[im 54/147  mediastinal]
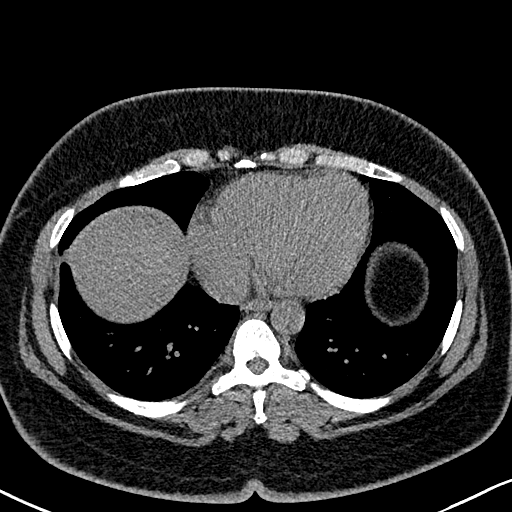
[im 54/147  lung]
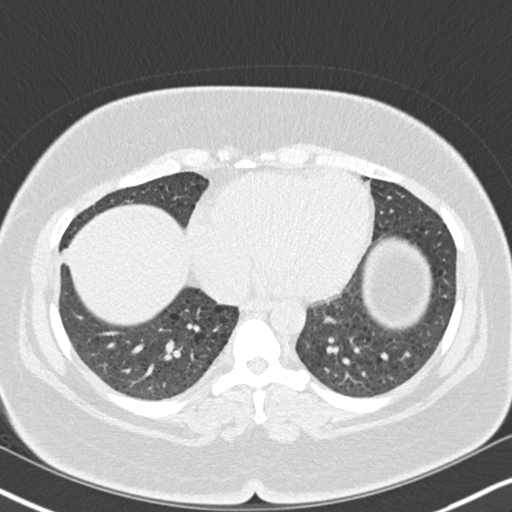
[im 67/147  lung]
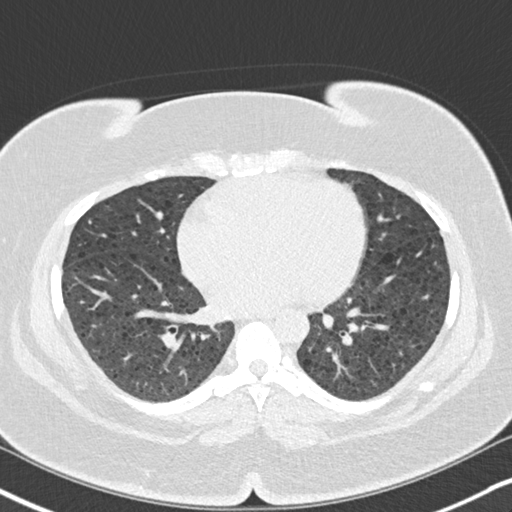
[im 80/147  lung]
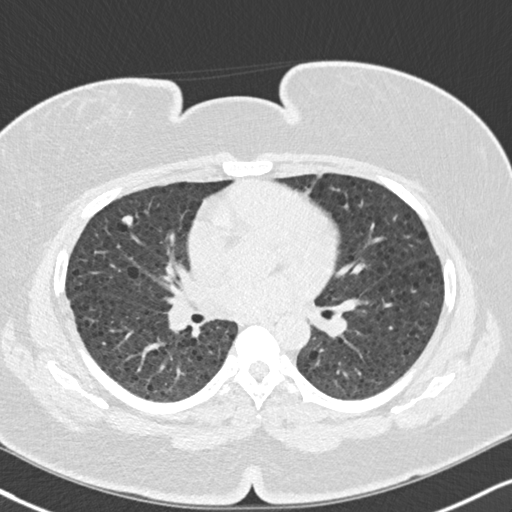
[im 93/147  lung]
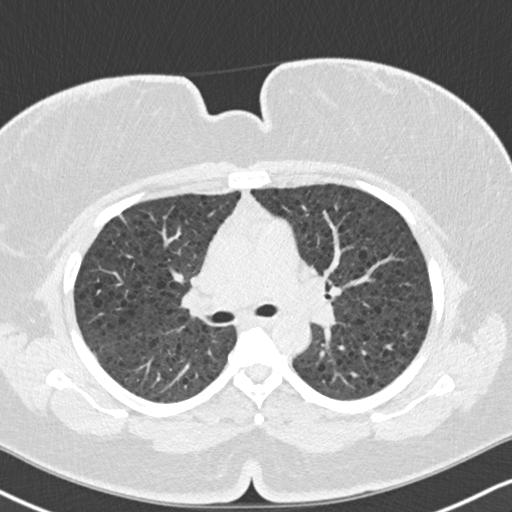
[im 100/147  mediastinal]
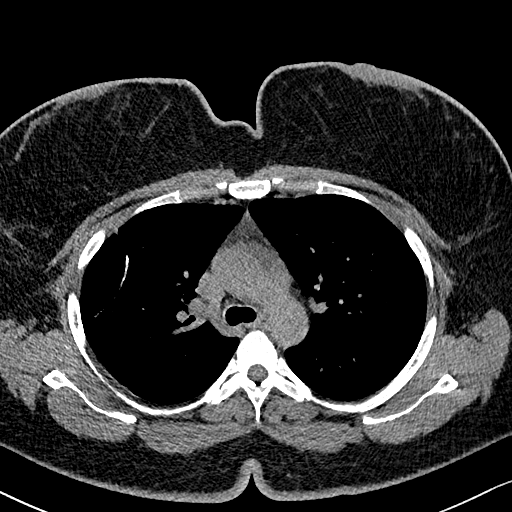
[im 100/147  lung]
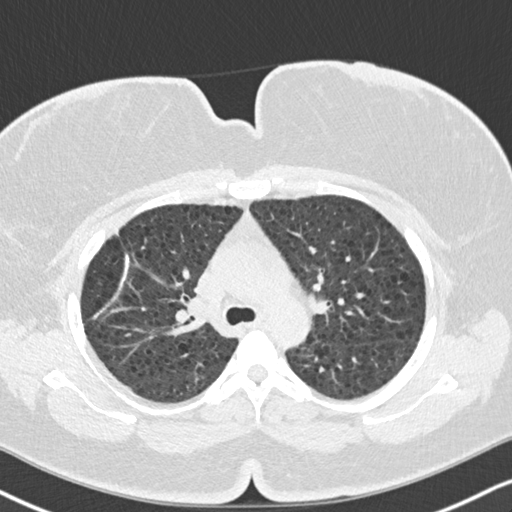
[im 113/147  lung]
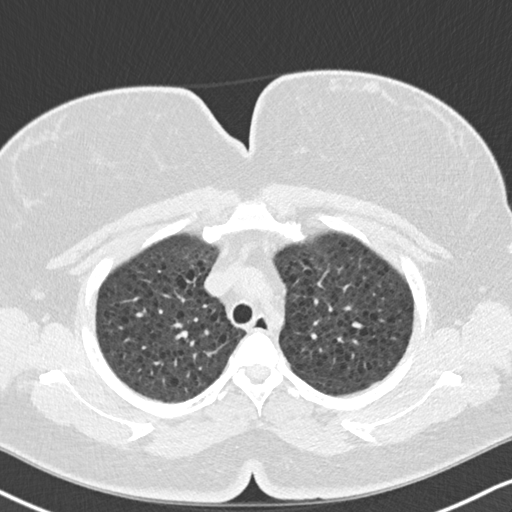
[im 127/147  lung]
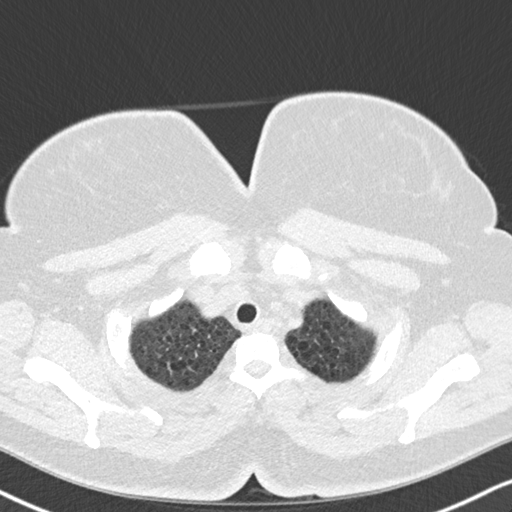
[im 140/147  lung]
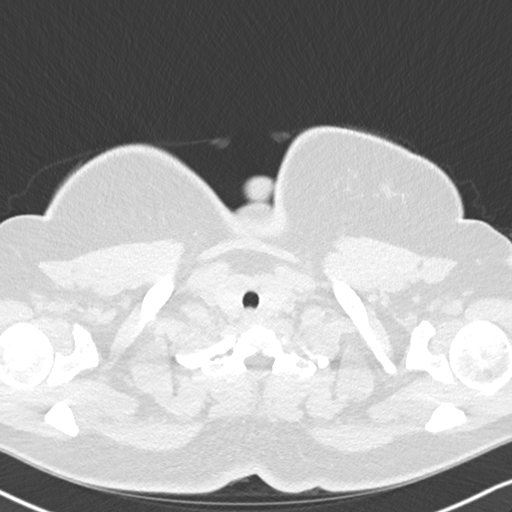

[Series 8: coronal · coronal · 0.59mm/px · 3 of 101 slices shown]
[im 21/101  lung]
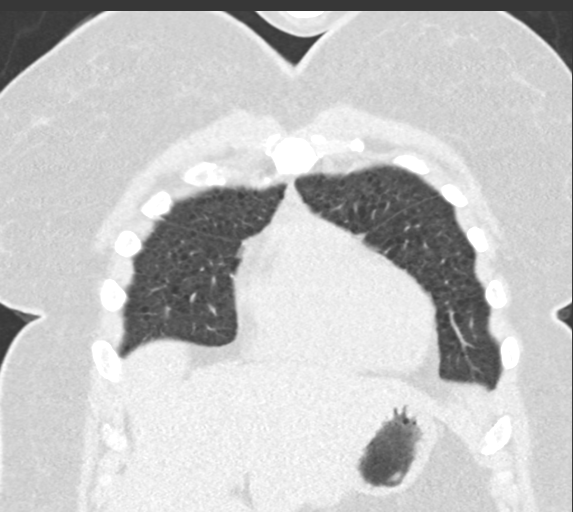
[im 41/101  lung]
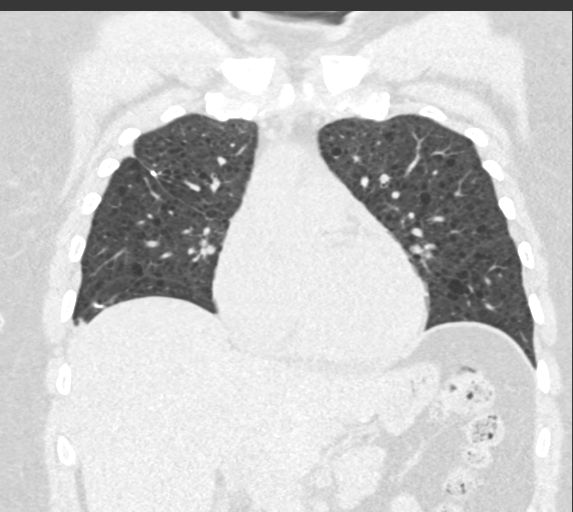
[im 61/101  lung]
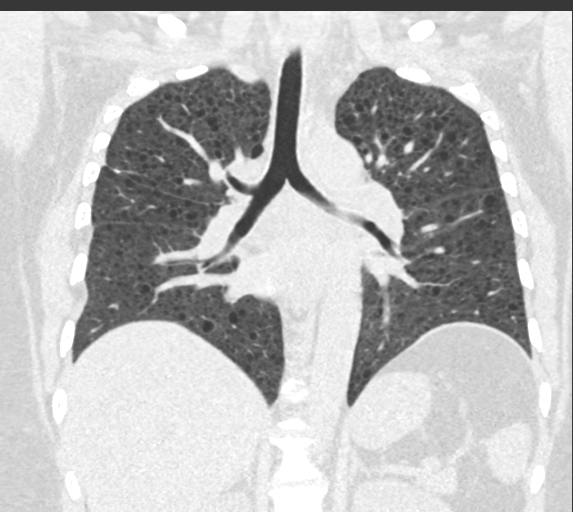

[15 of 36 positions shown; findings below may reference images not displayed]

FINDINGS: Cardiovascular: Vascular structures are unremarkable. Heart size
within normal limits. No pericardial effusion.

Mediastinum/Nodes: No pathologically enlarged mediastinal or
axillary lymph nodes. Hilar regions are difficult to definitively
evaluate without IV contrast. Esophagus is grossly unremarkable.

Lungs/Pleura: Cystic changes are seen throughout the lungs. Mildly
lobulated nodule in the right middle lobe measures 8 x 10 mm, stable
from prior exams when differences in slice collimation are
considered. Postoperative scarring in the right hemithorax. No
pleural fluid. Airway is unremarkable.

Upper Abdomen: Visualized portions of the liver, adrenal glands,
kidneys, spleen, pancreas, stomach and bowel are grossly
unremarkable.

Musculoskeletal: Degenerative changes in the spine. No worrisome
lytic or sclerotic lesions.
IMPRESSION: 1. Cystic lung disease, consistent with the known diagnosis of
lymphangioleiomyomatosis.
2. Right middle lobe nodule, stable and most likely benign.

## 2019-06-02 ENCOUNTER — Ambulatory Visit
Admission: EM | Admit: 2019-06-02 | Discharge: 2019-06-02 | Disposition: A | Discharge status: Home / Self Care | Payer: BC Managed Care – PPO | Attending: Nurse Practitioner | Admitting: Nurse Practitioner

## 2019-06-02 ENCOUNTER — Encounter: Payer: Self-pay | Admitting: Emergency Medicine

## 2019-06-02 ENCOUNTER — Other Ambulatory Visit: Payer: Self-pay

## 2019-06-02 DIAGNOSIS — N3090 Cystitis, unspecified without hematuria: Secondary | ICD-10-CM | POA: Diagnosis not present

## 2019-06-02 DIAGNOSIS — B9689 Other specified bacterial agents as the cause of diseases classified elsewhere: Secondary | ICD-10-CM

## 2019-06-02 LAB — POCT URINALYSIS DIP (MANUAL ENTRY)
Bilirubin, UA: NEGATIVE
Glucose, UA: NEGATIVE mg/dL
Ketones, POC UA: NEGATIVE mg/dL
Nitrite, UA: NEGATIVE
Protein Ur, POC: 100 mg/dL — AB
Spec Grav, UA: >=1.03 — AB (ref 1.010–1.025)
Urobilinogen, UA: 0.2 E.U./dL
pH, UA: 7 (ref 5.0–8.0)

## 2019-06-02 LAB — POCT URINE PREGNANCY: Preg Test, Ur: NEGATIVE

## 2019-06-02 MED ORDER — PHENAZOPYRIDINE HCL 200 MG PO TABS: 200.0000 mg | ORAL_TABLET | Freq: Three times a day (TID) | ORAL | 0 refills | Status: DC

## 2019-06-02 MED ORDER — CIPROFLOXACIN HCL 500 MG PO TABS: 500.0000 mg | ORAL_TABLET | Freq: Two times a day (BID) | ORAL | 0 refills | Status: DC

## 2019-06-02 NOTE — ED Provider Notes (Signed)
[facility]    CSN: [id number] Arrival date & time: 06/02/19  1421      History   Chief Complaint Chief Complaint  Patient presents with  . Dysuria    HPI Brenda Williams is a 38 y.o. female.   Subjective:  Brenda Williams is a 38 y.o. female who complains of dysuria, frequency and suprapubic pressure for 2 days. Patient denies back pain, fever, stomach ache, vaginal discharge, nausea or vomiting. Patient does not have a history of recurrent UTI.  Patient does not have a history of pyelonephritis. Patient in not on birth control. She is sexually active with her husband. LMP 1 week ago. No concerns for STDs at this time.   The following portions of the patient's history were reviewed and updated as appropriate: allergies, current medications, past family history, past medical history, past social history, past surgical history and problem list.         Past Medical History:  Diagnosis Date  . Abnormal Pap smear 2006   leep  . Anxiety   . Arthritis    osteoarthritis  . Complication of anesthesia    [facility], HAD ??  SEIZURE/ SHAKING  . Depression   . GERD (gastroesophageal reflux disease)   . Herpes    never had outbreak. pos per blood, doesn't know which type  . Lactose intolerance   . Lymphangiomatosis   . Polycystic ovarian syndrome   . Prediabetes   . SVD (spontaneous vaginal delivery)    x 1  . Varicose vein of leg   . Vitamin D deficiency     Patient Active Problem List   Diagnosis Date Noted  . MDD (major depressive disorder), recurrent episode, severe ([place]) 11/18/2017  . Mild intermittent asthma 06/12/2017  . Depression 03/17/2017  . Vitamin D deficiency 03/17/2017  . Class 2 obesity with serious comorbidity and body mass index (BMI) of 35.0 to 35.9 in adult 03/17/2017  . Prediabetes 02/23/2017  . 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 Obesity 01/12/2015  . Chronic fatigue 12/01/2014  . Pulmonary nodules  06/28/2013  . Cervix abnormality 05/12/2011  . Polycystic ovaries 05/12/2011  . Female infertility of unspecified origin 05/12/2011    Past Surgical History:  Procedure Laterality Date  . DILATION AND EVACUATION N/A 09/14/2012   Procedure: DILATATION AND EVACUATION;  Surgeon: Allena Katz, MD;  Location: [facility];  Service: Gynecology;  Laterality: N/A;  . HYSTEROSCOPY  2009   uterine polyp  . IVF Retrival  2010, 2011  . KNEE SURGERY  1998   right knee  . LEEP  2006  . LUNG BIOPSY Right 12/28/2015   Procedure: RIGHT LUNG BIOPSY;  Surgeon: Ivin Poot, MD;  Location: [facility];  Service: Thoracic;  Laterality: Right;  . SPHINCTEROTOMY  2010  . uterine polyp removal    . VIDEO ASSISTED THORACOSCOPY Right 12/28/2015   Procedure: RIGHT VIDEO ASSISTED THORACOSCOPY;  Surgeon: Ivin Poot, MD;  Location: [facility];  Service: Thoracic;  Laterality: Right;    OB History    Gravida  5   Para  1   Term  1   Preterm  0   AB  2   Living  1     SAB  1   TAB  0   Ectopic  1   Multiple  0   Live Births  1            Home Medications    Prior  to Admission medications   Medication Sig Start Date End Date Taking? Authorizing Provider  albuterol (PROVENTIL HFA;VENTOLIN HFA) 108 (90 Base) MCG/ACT inhaler Inhale 2 puffs into the lungs every 4 (four) hours as needed for wheezing or shortness of breath. 06/12/17   Collene Gobble, MD  ciprofloxacin (CIPRO) 500 MG tablet Take 1 tablet (500 mg total) by mouth 2 (two) times daily. 06/02/19   Enrique Sack, FNP  meclizine (ANTIVERT) 25 MG tablet Take 1 tablet (25 mg total) by mouth 3 (three) times daily as needed for dizziness. 06/15/18   Debbrah Alar, NP  phenazopyridine (PYRIDIUM) 200 MG tablet Take 1 tablet (200 mg total) by mouth 3 (three) times daily. 06/02/19   Enrique Sack, FNP  sirolimus (RAPAMUNE) 1 MG tablet Take 1 tablet (1 mg total) by mouth 2 (two) times daily. 05/20/18   Collene Gobble, MD  Vitamin D,  Ergocalciferol, (DRISDOL) 1.25 MG (50000 UT) CAPS capsule Take 1 capsule (50,000 Units total) by mouth every 7 (seven) days. 11/08/18   Shelda Pal, DO  phentermine (ADIPEX-P) 37.5 MG tablet Take 1 tablet (37.5 mg total) by mouth daily before breakfast. 01/08/18 06/02/19  Shelda Pal, DO    Family History Family History  Problem Relation Age of Onset  . Diabetes Maternal Grandmother   . Heart disease Maternal Grandmother   . Hypertension Maternal Grandfather   . Cancer Maternal Grandfather 70       bone, colon   . Diabetes Maternal Grandfather   . Diabetes Mother   . Cancer Mother 38       breast cancer  . Hyperlipidemia Mother   . Depression Mother   . Obesity Mother   . Hypertension Father   . Hyperlipidemia Father   . Thyroid disease Father   . Alcohol abuse Father   . Drug abuse Father   . Obesity Father   . Anesthesia problems Neg Hx     Social History Social History   Tobacco Use  . Smoking status: Never Smoker  . Smokeless tobacco: Never Used  Substance Use Topics  . Alcohol use: Yes    Alcohol/week: 0.0 standard drinks    Comment: OCC  . Drug use: No     Allergies   Fentanyl   Review of Systems Review of Systems  Constitutional: Negative for fever.  Gastrointestinal: Negative for nausea and vomiting.  Genitourinary: Positive for dysuria and frequency. Negative for flank pain and vaginal discharge.  Musculoskeletal: Negative for back pain.  All other systems reviewed and are negative.    Physical Exam Triage Vital Signs ED Triage Vitals  Enc Vitals Group     BP 06/02/19 1433 116/81     Pulse Rate 06/02/19 1433 76     Resp 06/02/19 1433 18     Temp 06/02/19 1433 98.5 F (36.9 C)     Temp Source 06/02/19 1433 Oral     SpO2 06/02/19 1433 93 %     Weight --      Height --      Head Circumference --      Peak Flow --      Pain Score 06/02/19 1432 0     Pain Loc --      Pain Edu? --      Excl. in [place]? --    No data  found.  Updated Vital Signs BP 116/81 (BP Location: Left Arm)   Pulse 76   Temp 98.5 F (36.9 C) (Oral)   Resp  18   LMP 05/19/2019   SpO2 93%   Visual Acuity Right Eye Distance:   Left Eye Distance:   Bilateral Distance:    Right Eye Near:   Left Eye Near:    Bilateral Near:     Physical Exam Vitals signs reviewed.  Constitutional:      Appearance: Normal appearance. She is not ill-appearing or toxic-appearing.  HENT:     Head: Normocephalic.  Neck:     Musculoskeletal: Normal range of motion.  Cardiovascular:     Rate and Rhythm: Normal rate and regular rhythm.  Pulmonary:     Effort: Pulmonary effort is normal.     Breath sounds: Normal breath sounds.  Abdominal:     Tenderness: There is no right CVA tenderness or left CVA tenderness.  Musculoskeletal: Normal range of motion.  Skin:    General: Skin is warm and dry.  Neurological:     General: No focal deficit present.     Mental Status: She is alert.      UC Treatments / Results  Labs (all labs ordered are listed, but only abnormal results are displayed) Labs Reviewed  POCT URINALYSIS DIP (MANUAL ENTRY) - Abnormal; Notable for the following components:      Result Value   Clarity, UA cloudy (*)    Spec Grav, UA >=1.030 (*)    Blood, UA large (*)    Protein Ur, POC =100 (*)    Leukocytes, UA Small (1+) (*)    All other components within normal limits  POCT URINE PREGNANCY - Normal  URINE CULTURE    EKG   Radiology No results found.  Procedures Procedures (including critical care time)  Medications Ordered in UC Medications - No data to display  Initial Impression / Assessment and Plan / UC Course  I have reviewed the triage vital signs and the nursing notes.  Pertinent labs & imaging results that were available during my care of the patient were reviewed by me and considered in my medical decision making (see chart for details).    38 yo female presenting with dysuria, frequency and  suprapubic pressure x 2 days. She denies back pain, fever, stomach ache, vaginal discharge, nausea or vomiting.   Plan:  1. Medications: ciprofloxacin and Pyridium  2. Maintain adequate hydration 3. Follow up if symptoms not improving, and prn.  Today's evaluation has revealed no signs of a dangerous process. Discussed diagnosis with patient and/or guardian. Patient and/or guardian aware of their diagnosis, possible red flag symptoms to watch out for and need for close follow up. Patient and/or guardian understands verbal and written discharge instructions. Patient and/or guardian comfortable with plan and disposition.  Patient and/or guardian has a clear mental status at this time, good insight into illness (after discussion and teaching) and has clear judgment to make decisions regarding their care  This care was provided during an unprecedented National Emergency due to the Novel Coronavirus (COVID-19) pandemic. COVID-19 infections and transmission risks place heavy strains on healthcare resources.  As this pandemic evolves, our facility, providers, and staff strive to respond fluidly, to remain operational, and to provide care relative to available resources and information. Outcomes are unpredictable and treatments are without well-defined guidelines. Further, the impact of COVID-19 on all aspects of urgent care, including the impact to patients seeking care for reasons other than COVID-19, is unavoidable during this national emergency. At this time of the global pandemic, management of patients has significantly changed, even for non-COVID positive patients  given high local and regional COVID volumes at this time requiring high healthcare system and resource utilization. The standard of care for management of both COVID suspected and non-COVID suspected patients continues to change rapidly at the local, regional, national, and global levels. This patient was worked up and treated to the best  available but ever changing evidence and resources available at this current time.   Documentation was completed with the aid of voice recognition software. Transcription may contain typographical errors.  Final Clinical Impressions(s) / UC Diagnoses   Final diagnoses:  Bladder infection     Discharge Instructions     Take medications as prescribed. Drink plenty of fluids.     ED Prescriptions    Medication Sig Dispense Auth. Provider   ciprofloxacin (CIPRO) 500 MG tablet Take 1 tablet (500 mg total) by mouth 2 (two) times daily. 14 tablet Enrique Sack, FNP   phenazopyridine (PYRIDIUM) 200 MG tablet Take 1 tablet (200 mg total) by mouth 3 (three) times daily. 6 tablet Enrique Sack, FNP     PDMP not reviewed this encounter.   Enrique Sack, [place] 06/02/19 1536

## 2019-06-02 NOTE — ED Triage Notes (Signed)
Pt presents to [facility] for assessment of lower abdominal pain with urination x 2 days, worsening last night, and today PTA patient noted bright red blood when wiping.

## 2019-06-02 NOTE — ED Notes (Signed)
Patient able to ambulate independently  

## 2019-06-02 NOTE — Discharge Instructions (Addendum)
Take medications as prescribed. Drink plenty of fluids.

## 2019-06-05 LAB — URINE CULTURE
Culture: 50000 — AB
Special Requests: NORMAL

## 2019-06-22 ENCOUNTER — Encounter: Payer: Self-pay | Admitting: Family Medicine

## 2019-06-22 ENCOUNTER — Ambulatory Visit: Payer: BC Managed Care – PPO | Admitting: Family Medicine

## 2019-06-22 ENCOUNTER — Other Ambulatory Visit: Payer: Self-pay

## 2019-06-22 VITALS — BP 132/86 | HR 77 | Temp 97.3°F | Ht 67.0 in | Wt 239.0 lb

## 2019-06-22 DIAGNOSIS — Z01818 Encounter for other preprocedural examination: Secondary | ICD-10-CM

## 2019-06-22 LAB — CBC
HCT: 41.7 % (ref 36.0–46.0)
Hemoglobin: 13.3 g/dL (ref 12.0–15.0)
MCHC: 31.9 g/dL (ref 30.0–36.0)
MCV: 90.4 fl (ref 78.0–100.0)
Platelets: 304 10*3/uL (ref 150.0–400.0)
RBC: 4.61 Mil/uL (ref 3.87–5.11)
RDW: 15.2 % (ref 11.5–15.5)
WBC: 5.2 10*3/uL (ref 4.0–10.5)

## 2019-06-22 LAB — BASIC METABOLIC PANEL
BUN: 15 mg/dL (ref 6–23)
CO2: 29 mEq/L (ref 19–32)
Calcium: 10 mg/dL (ref 8.4–10.5)
Chloride: 104 mEq/L (ref 96–112)
Creatinine, Ser: 0.7 mg/dL (ref 0.40–1.20)
GFR: 112.11 mL/min (ref >60.00)
Glucose, Bld: 115 mg/dL — ABNORMAL HIGH (ref 70–99)
Potassium: 4.2 mEq/L (ref 3.5–5.1)
Sodium: 137 mEq/L (ref 135–145)

## 2019-06-22 LAB — HEMOGLOBIN A1C: Hgb A1c MFr Bld: 6.1 % (ref 4.6–6.5)

## 2019-06-22 NOTE — Patient Instructions (Addendum)
Give [place] 2-3 business days to get the results of your labs back.   No med changes prior to surgery.  We will mail you your form when completed.   Let us know if you need anything.

## 2019-06-22 NOTE — Progress Notes (Signed)
Subjective:   Chief Complaint  Patient presents with  . Medical Clearance    Brenda Williams  is here for a Pre-operative clearance.   Pt has liposuction procedure on 3/3. Local anesthesia w possible benzo/narcotic use. Other than Fentanyl, no issues with opiates. No IV meds to be used. She has a hx of prediabetes. Last A1c roughly 2 years ago. No CP or SOB.     Patient Active Problem List   Diagnosis Date Noted  . MDD (major depressive disorder), recurrent episode, severe ([place]) 11/18/2017  . Mild intermittent asthma 06/12/2017  . Depression 03/17/2017  . Vitamin D deficiency 03/17/2017  . Class 2 obesity with serious comorbidity and body mass index (BMI) of 35.0 to 35.9 in adult 03/17/2017  . Prediabetes 02/23/2017  . 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 Obesity 01/12/2015  . Chronic fatigue 12/01/2014  . Pulmonary nodules 06/28/2013  . Cervix abnormality 05/12/2011  . Polycystic ovaries 05/12/2011  . Female infertility of unspecified origin 05/12/2011   Past Medical History:  Diagnosis Date  . Abnormal Pap smear 2006   leep  . Anxiety   . Arthritis    osteoarthritis  . Complication of anesthesia    2014 d+c [facility], HAD ??  SEIZURE/ SHAKING  . Depression   . GERD (gastroesophageal reflux disease)   . Herpes    never had outbreak. pos per blood, doesn't know which type  . Lactose intolerance   . Lymphangiomatosis   . Polycystic ovarian syndrome   . Prediabetes   . SVD (spontaneous vaginal delivery)    x 1  . Varicose vein of leg   . Vitamin D deficiency     Past Surgical History:  Procedure Laterality Date  . DILATION AND EVACUATION N/A 09/14/2012   Procedure: DILATATION AND EVACUATION;  Surgeon: Allena Katz, MD;  Location: [facility];  Service: Gynecology;  Laterality: N/A;  . HYSTEROSCOPY  2009   uterine polyp  . IVF Retrival  2010, 2011  . KNEE SURGERY  1998   right knee  . LEEP  2006  . LUNG BIOPSY Right 12/28/2015   Procedure: RIGHT LUNG BIOPSY;   Surgeon: Ivin Poot, MD;  Location: [facility];  Service: Thoracic;  Laterality: Right;  . SPHINCTEROTOMY  2010  . uterine polyp removal    . VIDEO ASSISTED THORACOSCOPY Right 12/28/2015   Procedure: RIGHT VIDEO ASSISTED THORACOSCOPY;  Surgeon: Ivin Poot, MD;  [facility];  Service: Thoracic;  Laterality: Right;    Current Outpatient Medications  Medication Sig Dispense Refill  . albuterol (PROVENTIL HFA;VENTOLIN HFA) 108 (90 Base) MCG/ACT inhaler Inhale 2 puffs into the lungs every 4 (four) hours as needed for wheezing or shortness of breath. 1 Inhaler 5  . ciprofloxacin (CIPRO) 500 MG tablet Take 1 tablet (500 mg total) by mouth 2 (two) times daily. 14 tablet 0  . meclizine (ANTIVERT) 25 MG tablet Take 1 tablet (25 mg total) by mouth 3 (three) times daily as needed for dizziness. 30 tablet 0  . phenazopyridine (PYRIDIUM) 200 MG tablet Take 1 tablet (200 mg total) by mouth 3 (three) times daily. 6 tablet 0  . sirolimus (RAPAMUNE) 1 MG tablet Take 1 tablet (1 mg total) by mouth 2 (two) times daily. 180 tablet 1  . Vitamin D, Ergocalciferol, (DRISDOL) 1.25 MG (50000 UT) CAPS capsule Take 1 capsule (50,000 Units total) by mouth every 7 (seven) days. 12 capsule 0   Allergies  Allergen Reactions  . Fentanyl Itching  Benadryl effective for itching.    Family History  Problem Relation Age of Onset  . Diabetes Maternal Grandmother   . Heart disease Maternal Grandmother   . Hypertension Maternal Grandfather   . Cancer Maternal Grandfather 70       bone, colon   . Diabetes Maternal Grandfather   . Diabetes Mother   . Cancer Mother 68       breast cancer  . Hyperlipidemia Mother   . Depression Mother   . Obesity Mother   . Hypertension Father   . Hyperlipidemia Father   . Thyroid disease Father   . Alcohol abuse Father   . Drug abuse Father   . Obesity Father   . Anesthesia problems Neg Hx      Review of Systems: Pulmonary:  No shortness of breath Cardiovascular:  no  chest pain   Objective:   Vitals:   06/22/19 0751  BP: 132/86  Pulse: 77  Temp: (!) 97.3 F (36.3 C)  TempSrc: Temporal  SpO2: 93%  Weight: 239 lb (108.4 kg)  Height: 5\' 7"  (1.702 m)   Body mass index is 37.43 kg/m.  General:  well developed, well nourished, in no apparent distress Skin:  warm, no pallor or diaphoresis Head:  normocephalic, atraumatic Eyes:  pupils equal and round, sclera anicteric without injection Throat/Pharynx:  lips and gingiva without lesion; tongue and uvula midline; non-inflamed pharynx; no exudates or postnasal drainage Neck: neck supple without adenopathy, thyromegaly, or masses, no bruits, no jugular venous distention Lungs:  clear to auscultation, breath sounds equal bilaterally, no respiratory distress Cardio:  regular rate and rhythm without murmurs, no LE edema Musculoskeletal:  symmetrical muscle groups noted without atrophy or deformity Neuro:  gait normal Psych: Age appropriate judgment and insight; normal mood   Assessment:   Pre-op exam - Plan: HgB A1c, CBC, Basic Metabolic Panel (BMET)   Plan:   Ck basic labs. Low cardiac risk, not undergoing gen anesthesia. Barring extremely high A1c, no contraindications to procedure. Will mail to patient F/u in 6 mo for CPE or prn.  The patient voiced understanding and agreement to the plan.  [place], DO 06/22/19  8:21 AM

## 2019-06-28 ENCOUNTER — Telehealth: Payer: Self-pay | Admitting: Emergency Medicine

## 2019-06-28 NOTE — Telephone Encounter (Signed)
Checked RB's box to see if we had received fax for the medical clearance form and there was nothing in there.   Called [facility] and spoke with Benjamine Mola to let her know that we had not received the form. Asked her which number she faxed it to and after verifying that, she had faxed it to our old office fax. I provided her with our new office fax number for her to fax it in Dr. Agustina Caroli attn. Will keep encounter open until we have received fax.

## 2019-06-29 ENCOUNTER — Telehealth: Payer: Self-pay

## 2019-06-29 NOTE — Telephone Encounter (Signed)
Fax for what? I have not seen anything.

## 2019-06-29 NOTE — Telephone Encounter (Signed)
I have not seen anything.   See telephone note from Dr. Agustina Caroli office as looks like they should be receiving this fax

## 2019-06-29 NOTE — Telephone Encounter (Signed)
Copied from [place] [id number]. Topic: General - Other >> Jun 28, 2019 12:37 PM Rayann Heman wrote: Reason for CRM: Benjamine Mola calling from [facility] called and would like to know if we received fax. Please advise

## 2019-07-01 NOTE — Telephone Encounter (Signed)
The medical clearance is now in RB box/to sign folder. RB does pt need an OV for you to sign the medical clearance? Please advise.

## 2019-07-01 NOTE — Telephone Encounter (Signed)
We have not seen this

## 2019-07-04 NOTE — Telephone Encounter (Signed)
Medical clearance to do what?  Also, when does she need a response - is she ok to wait until I can review in office?

## 2019-07-04 NOTE — Telephone Encounter (Signed)
Dr. Lamonte Sakai, I called back [facility]. I was told by Benjamine Mola they are planning scheduling the patient for liposuction in March 2021. They will be using lidocaine with epinephrine and expect the procedure to last 3 hours.  She has not been seen by you since 07/26/18. Are you comfortable signing the form based on those notes when you return to clinic? OR do require an appointment with her for evaluation before signing?

## 2019-07-05 NOTE — Telephone Encounter (Signed)
I do not have forms on this patient in my possession.

## 2019-07-05 NOTE — Telephone Encounter (Signed)
[place] and spoke with Tillie Rung, advised that no form was needed but we could just type a letter of clearance and fax it to [phone] attn Carolee Rota  Letter has been typed and sent as requested.  Nothing further needed at this time- will close encounter.

## 2019-07-05 NOTE — Telephone Encounter (Signed)
As long as she feels well, I am comfortable with signing for that kind of minimal anesthesia.

## 2019-07-05 NOTE — Telephone Encounter (Signed)
I checked front cubby and cubby on B side for this release but was unable to locate it.   Ria Comment please advise if you have forms on this patient for this matter, or if these need to be re-requested.  Thanks!

## 2019-07-05 NOTE — Telephone Encounter (Signed)
Have not received any paperwork

## 2019-07-15 HISTORY — PX: LIPOSUCTION: SHX10

## 2019-08-02 ENCOUNTER — Telehealth: Payer: Self-pay | Admitting: Emergency Medicine

## 2019-08-02 NOTE — Telephone Encounter (Signed)
I don't have any forms for this pt. These forms were filled out back in December and returned to this office.  LMTCB x1 for [employer] to refax these forms again.

## 2019-08-03 NOTE — Telephone Encounter (Signed)
[place] and spoke with Zipporah who reported she did not receive the clearance forms from December and does request these be refaxed to her again at [phone], with a  cover sheet attn [place].  Ria Comment, I don't see these scanned into patient's chart.  Do you still happen to have them?  Zipporah will call back to let us know once she has received these.

## 2019-08-03 NOTE — Telephone Encounter (Signed)
[phone] calling back Zittorah

## 2019-08-03 NOTE — Telephone Encounter (Signed)
Upon further inspection of patient's chart, formal clearance forms were not received by [employer].  Per the 12.22.2020 phone note, a letter would suffice.  Called Zipporah back to verify that a letter is still okay rather than a formal clearance form.  Per Zipporah, as long as they have some form of documentation clearing pt for surgery.  Advised will reprint the letter from 12/22 and refax.  This has been done. Will await call back from Brenda Williams verifying that she received the clearance.

## 2019-08-03 NOTE — Telephone Encounter (Signed)
I don't have these forms. I sent them to be scanned.

## 2019-08-09 ENCOUNTER — Encounter (HOSPITAL_BASED_OUTPATIENT_CLINIC_OR_DEPARTMENT_OTHER): Payer: Self-pay | Admitting: *Deleted

## 2019-08-09 ENCOUNTER — Emergency Department (HOSPITAL_BASED_OUTPATIENT_CLINIC_OR_DEPARTMENT_OTHER)
Admission: EM | Admit: 2019-08-09 | Discharge: 2019-08-09 | Disposition: A | Discharge status: Home / Self Care | Payer: No Typology Code available for payment source | Attending: Emergency Medicine | Admitting: Emergency Medicine

## 2019-08-09 ENCOUNTER — Emergency Department (HOSPITAL_BASED_OUTPATIENT_CLINIC_OR_DEPARTMENT_OTHER): Payer: No Typology Code available for payment source

## 2019-08-09 ENCOUNTER — Other Ambulatory Visit: Payer: Self-pay

## 2019-08-09 DIAGNOSIS — Z79899 Other long term (current) drug therapy: Secondary | ICD-10-CM | POA: Diagnosis not present

## 2019-08-09 DIAGNOSIS — Z885 Allergy status to narcotic agent status: Secondary | ICD-10-CM | POA: Insufficient documentation

## 2019-08-09 DIAGNOSIS — R102 Pelvic and perineal pain: Secondary | ICD-10-CM | POA: Diagnosis not present

## 2019-08-09 DIAGNOSIS — R7303 Prediabetes: Secondary | ICD-10-CM | POA: Diagnosis not present

## 2019-08-09 DIAGNOSIS — R1032 Left lower quadrant pain: Secondary | ICD-10-CM | POA: Diagnosis present

## 2019-08-09 LAB — CBC WITH DIFFERENTIAL/PLATELET
Abs Immature Granulocytes: 0.02 10*3/uL (ref 0.00–0.07)
Basophils Absolute: 0 10*3/uL (ref 0.0–0.1)
Basophils Relative: 1 %
Eosinophils Absolute: 0 10*3/uL (ref 0.0–0.5)
Eosinophils Relative: 0 %
HCT: 43.1 % (ref 36.0–46.0)
Hemoglobin: 14 g/dL (ref 12.0–15.0)
Immature Granulocytes: 0 %
Lymphocytes Relative: 24 %
Lymphs Abs: 1.8 10*3/uL (ref 0.7–4.0)
MCH: 29.4 pg (ref 26.0–34.0)
MCHC: 32.5 g/dL (ref 30.0–36.0)
MCV: 90.5 fL (ref 80.0–100.0)
Monocytes Absolute: 0.3 10*3/uL (ref 0.1–1.0)
Monocytes Relative: 3 %
Neutro Abs: 5.5 10*3/uL (ref 1.7–7.7)
Neutrophils Relative %: 72 %
Platelets: 313 10*3/uL (ref 150–400)
RBC: 4.76 MIL/uL (ref 3.87–5.11)
RDW: 14.4 % (ref 11.5–15.5)
WBC: 7.7 10*3/uL (ref 4.0–10.5)
nRBC: 0 % (ref 0.0–0.2)

## 2019-08-09 LAB — BASIC METABOLIC PANEL
Anion gap: 7 (ref 5–15)
BUN: 10 mg/dL (ref 6–20)
CO2: 24 mmol/L (ref 22–32)
Calcium: 9.7 mg/dL (ref 8.9–10.3)
Chloride: 105 mmol/L (ref 98–111)
Creatinine, Ser: 0.61 mg/dL (ref 0.44–1.00)
GFR calc Af Amer: >60 mL/min (ref >60)
GFR calc non Af Amer: >60 mL/min (ref >60)
Glucose, Bld: 105 mg/dL — ABNORMAL HIGH (ref 70–99)
Potassium: 4 mmol/L (ref 3.5–5.1)
Sodium: 136 mmol/L (ref 135–145)

## 2019-08-09 LAB — URINALYSIS, ROUTINE W REFLEX MICROSCOPIC
Bilirubin Urine: NEGATIVE
Glucose, UA: NEGATIVE mg/dL
Hgb urine dipstick: NEGATIVE
Ketones, ur: NEGATIVE mg/dL
Leukocytes,Ua: NEGATIVE
Nitrite: NEGATIVE
Protein, ur: NEGATIVE mg/dL
Specific Gravity, Urine: 1.02 (ref 1.005–1.030)
pH: 7 (ref 5.0–8.0)

## 2019-08-09 LAB — PREGNANCY, URINE: Preg Test, Ur: NEGATIVE

## 2019-08-09 LAB — WET PREP, GENITAL
Clue Cells Wet Prep HPF POC: NONE SEEN
Sperm: NONE SEEN
Trich, Wet Prep: NONE SEEN
Yeast Wet Prep HPF POC: NONE SEEN

## 2019-08-09 MED ORDER — ACETAMINOPHEN 500 MG PO TABS
1000.0000 mg | ORAL_TABLET | Freq: Once | ORAL | Status: AC
  Administered 2019-08-09: 1000 mg via ORAL
  Filled 2019-08-09: qty 2

## 2019-08-09 MED ORDER — IOHEXOL 300 MG/ML  SOLN
100.0000 mL | Freq: Once | INTRAMUSCULAR | Status: AC | PRN
  Administered 2019-08-09: 100 mL via INTRAVENOUS

## 2019-08-09 NOTE — ED Provider Notes (Signed)
Brenda Williams   CSN: [id number] Arrival date & time: 08/09/19  1122   History Chief Complaint  Patient presents with  . Abdominal Pain  . Back Pain    Brenda Williams is a 39 y.o. female with past medical history significant for PCOS, prediabetes who presents for evaluation of pelvic pain.  Patient states approximately 2 hours PTA she developed a severe onset suprapubic and left lower quadrant abdominal pain.  Pain initially 10.  Patient states her pain has been shortly resolved and is only currently rated a 4/10.  Patient states she did have pain similarly when she had an ovarian cyst rupture.  States she is sexually active with her husband is not concerned about STDs.  Her last menstrual cycle was 3 weeks ago.  No vaginal discharge.  She is been tolerating p.o. intake at home without difficulty.  No fever, chills, nausea, vomiting, chest pain, shortness of breath, abdominal pain, diarrhea, dysuria, hematuria.  Denies additional aggravating or alleviating factors.  She has not take anything for pain PTA. Described her pain originally as a lightning bolt which is not a dull achy sensation.  History obtained from patient and past medical records.  No interpreter is used.  HPI     Past Medical History:  Diagnosis Date  . Abnormal Pap smear 2006   leep  . Anxiety   . Arthritis    osteoarthritis  . Complication of anesthesia    2014 [facility]c [facility], HAD ??  SEIZURE/ SHAKING  . Depression   . GERD (gastroesophageal reflux disease)   . Herpes    never had outbreak. pos per blood, doesn't know which type  . Lactose intolerance   . Lymphangiomatosis   . Polycystic ovarian syndrome   . Prediabetes   . SVD (spontaneous vaginal delivery)    x 1  . Varicose vein of leg   . Vitamin D deficiency     Patient Active Problem List   Diagnosis Date Noted  . MDD (major depressive disorder), recurrent episode, severe ([place]) 11/18/2017  . Mild  intermittent asthma 06/12/2017  . Depression 03/17/2017  . Vitamin D deficiency 03/17/2017  . Class 2 obesity with serious comorbidity and body mass index (BMI) of 35.0 to 35.9 in adult 03/17/2017  . Prediabetes 02/23/2017  . 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 Obesity 01/12/2015  . Chronic fatigue 12/01/2014  . Pulmonary nodules 06/28/2013  . Cervix abnormality 05/12/2011  . Polycystic ovaries 05/12/2011  . Female infertility of unspecified origin 05/12/2011    Past Surgical History:  Procedure Laterality Date  . DILATION AND EVACUATION N/A 09/14/2012   Procedure: DILATATION AND EVACUATION;  Surgeon: Allena Katz, MD;  Location: [facility];  Service: Gynecology;  Laterality: N/A;  . HYSTEROSCOPY  2009   uterine polyp  . IVF Retrival  2010, 2011  . KNEE SURGERY  1998   right knee  . LEEP  2006  . LUNG BIOPSY Right 12/28/2015   Procedure: RIGHT LUNG BIOPSY;  Surgeon: Ivin Poot, MD;  Location: [facility];  Service: Thoracic;  Laterality: Right;  . SPHINCTEROTOMY  2010  . uterine polyp removal    . VIDEO ASSISTED THORACOSCOPY Right 12/28/2015   Procedure: RIGHT VIDEO ASSISTED THORACOSCOPY;  Surgeon: Ivin Poot, MD;  Location: [facility];  Service: Thoracic;  Laterality: Right;     OB History    Gravida  5   Para  1   Term  1   Preterm  0   AB  2   Living  1     SAB  1   TAB  0   Ectopic  1   Multiple  0   Live Births  1           Family History  Problem Relation Age of Onset  . Diabetes Maternal Grandmother   . Heart disease Maternal Grandmother   . Hypertension Maternal Grandfather   . Cancer Maternal Grandfather 70       bone, colon   . Diabetes Maternal Grandfather   . Diabetes Mother   . Cancer Mother 75       breast cancer  . Hyperlipidemia Mother   . Depression Mother   . Obesity Mother   . Hypertension Father   . Hyperlipidemia Father   . Thyroid disease Father   . Alcohol abuse Father   . Drug abuse Father   . Obesity Father    . Anesthesia problems Neg Hx     Social History   Tobacco Use  . Smoking status: Never Smoker  . Smokeless tobacco: Never Used  Substance Use Topics  . Alcohol use: Yes    Alcohol/week: 0.0 standard drinks    Comment: OCC  . Drug use: No    Home Medications Prior to Admission medications   Medication Sig Start Date End Date Taking? Authorizing Provider  albuterol (PROVENTIL HFA;VENTOLIN HFA) 108 (90 Base) MCG/ACT inhaler Inhale 2 puffs into the lungs every 4 (four) hours as needed for wheezing or shortness of breath. 06/12/17  Yes Collene Gobble, MD  sirolimus (RAPAMUNE) 1 MG tablet Take 1 tablet (1 mg total) by mouth 2 (two) times daily. 05/20/18  Yes Collene Gobble, MD  phentermine (ADIPEX-P) 37.5 MG tablet Take 1 tablet (37.5 mg total) by mouth daily before breakfast. 01/08/18 06/02/19  Shelda Pal, DO    Allergies    Fentanyl  Review of Systems   Review of Systems  Constitutional: Negative.   HENT: Negative.   Respiratory: Negative.   Cardiovascular: Negative.   Gastrointestinal: Negative.   Genitourinary: Positive for pelvic pain. Negative for decreased urine volume, difficulty urinating, dyspareunia, dysuria, enuresis, flank pain, frequency, genital sores, hematuria, menstrual problem, urgency, vaginal bleeding, vaginal discharge and vaginal pain.  Musculoskeletal: Negative.   Skin: Negative.   Neurological: Negative.   All other systems reviewed and are negative.   Physical Exam Updated Vital Signs BP (!) 135/93   Pulse 76   Temp 98.5 F (36.9 C) (Oral)   Resp 20   Ht 5\' 7"  (1.702 m)   Wt 106.6 kg   LMP 07/19/2019   SpO2 98%   BMI 36.81 kg/m   Physical Exam Vitals and nursing Williams reviewed. Exam conducted with a chaperone present.  Constitutional:      General: She is not in acute distress.    Appearance: She is well-developed. She is not ill-appearing, toxic-appearing or diaphoretic.  HENT:     Head: Normocephalic and atraumatic.      Mouth/Throat:     Mouth: Mucous membranes are moist.  Eyes:     Extraocular Movements: Extraocular movements intact.     Pupils: Pupils are equal, round, and reactive to light.  Cardiovascular:     Rate and Rhythm: Normal rate.     Heart sounds: Normal heart sounds.  Pulmonary:     Effort: Pulmonary effort is normal. No respiratory distress.     Breath sounds: Normal breath sounds.  Abdominal:  General: Bowel sounds are normal. There is no distension.     Palpations: Abdomen is soft.     Tenderness: There is abdominal tenderness in the left lower quadrant. There is no right CVA tenderness, left CVA tenderness, guarding or rebound. Negative signs include Murphy's sign and McBurney's sign.     Hernia: No hernia is present.     Comments: Left pelvic pain just inferior to LLQ  Genitourinary:    Comments: Normal appearing external female genitalia without rashes or lesions, normal vaginal epithelium. Normal appearing cervix with light white discharge. No cervical petechiae. Cervical os is closed. There is no bleeding noted at the os. No Odor. Bimanual: No CMT, nontender.  No palpable adnexal masses or tenderness. Uterus midline and not fixed. Rectovaginal exam was deferred.  No cystocele or rectocele noted. No pelvic lymphadenopathy noted. Wet prep was obtained.  Cultures for gonorrhea and chlamydia collected. Exam performed with chaperone in room. Musculoskeletal:        General: Normal range of motion.     Cervical back: Normal range of motion.     Comments: No lumbar tenderness palpation.  Full range of motion bilateral hips.  Skin:    General: Skin is warm and dry.     Capillary Refill: Capillary refill takes less than 2 seconds.     Comments: Brisk capillary refill.  Neurological:     Mental Status: She is alert.    ED Results / Procedures / Treatments   Labs (all labs ordered are listed, but only abnormal results are displayed) Labs Reviewed  WET PREP, GENITAL - Abnormal;  Notable for the following components:      Result Value   WBC, Wet Prep HPF POC FEW (*)    All other components within normal limits  BASIC METABOLIC PANEL - Abnormal; Notable for the following components:   Glucose, Bld 105 (*)    All other components within normal limits  URINALYSIS, ROUTINE W REFLEX MICROSCOPIC  PREGNANCY, URINE  CBC WITH DIFFERENTIAL/PLATELET  GC/CHLAMYDIA PROBE AMP ([place]) NOT AT [facility]    EKG None  Radiology CT Abdomen Pelvis W Contrast  Result Date: 08/09/2019 CLINICAL DATA:  Left lower quadrant abdominal pain. EXAM: CT ABDOMEN AND PELVIS WITH CONTRAST TECHNIQUE: Multidetector CT imaging of the abdomen and pelvis was performed using the standard protocol following bolus administration of intravenous contrast. CONTRAST:  169mL OMNIPAQUE IOHEXOL 300 MG/ML  SOLN COMPARISON:  None. FINDINGS: Lower chest: Lucencies in the lung parenchyma are compatible with the patient's known history of pulmonary lymphangioleiomyomatosis. Hepatobiliary: Insert normal infused liver There is no evidence for gallstones, gallbladder wall thickening, or pericholecystic fluid. No intrahepatic or extrahepatic biliary dilation. Pancreas: No focal mass lesion. No dilatation of the main duct. No intraparenchymal cyst. No peripancreatic edema. Spleen: No splenomegaly. No focal mass lesion. Adrenals/Urinary Tract: No adrenal nodule or mass. 1.3 cm water density lesion interpolar left kidney is compatible with a cyst. Kidneys otherwise unremarkable. No evidence for hydroureter. The urinary bladder appears normal for the degree of distention. Stomach/Bowel: Stomach is unremarkable. No gastric wall thickening. No evidence of outlet obstruction. Duodenum is normally positioned as is the ligament of Treitz. No small bowel wall thickening. No small bowel dilatation. The terminal ileum is normal. The appendix is normal. No gross colonic mass. No colonic wall thickening. Vascular/Lymphatic: No abdominal aortic  aneurysm. Aortocaval node on image 45/series 2 measures 10 mm short axis. 10 mm short axis right common iliac node identified on 59/2. Right pelvic sidewall lymph  node measures 14 mm short axis on 70/2. Reproductive: The uterus is unremarkable. Right ovary unremarkable. 3.4 x 1.9 cm probably benign cyst identified left ovary. Other: Trace free fluid noted in the cul-de-sac. Musculoskeletal: No worrisome lytic or sclerotic osseous abnormality. IMPRESSION: 1. 3.4 x 1.9 cm probably benign cyst identified in the left ovary. Given presence of symptoms, patient age, and lesion size, follow-up ultrasound recommended in 6-12 weeks. This recommendation follows ACR consensus guidelines: White Paper of the ACR Incidental Findings Committee II on Adnexal Findings. J Am Coll Radiol 2013:10:675-681. 2. Borderline lymphadenopathy in the retroperitoneum of the abdomen with mild right pelvic sidewall lymphadenopathy. While likely reactive, follow-up CT scan in 3 months recommended to ensure stability. Electronically Signed   By: Misty Stanley M.D.   On: 08/09/2019 14:51   US PELVIC COMPLETE W TRANSVAGINAL AND TORSION R/O  Result Date: 08/09/2019 CLINICAL DATA:  Pelvic pain. EXAM: TRANSABDOMINAL AND TRANSVAGINAL ULTRASOUND OF PELVIS DOPPLER ULTRASOUND OF OVARIES TECHNIQUE: Both transabdominal and transvaginal ultrasound examinations of the pelvis were performed. Transabdominal technique was performed for global imaging of the pelvis including uterus, ovaries, adnexal regions, and pelvic cul-de-sac. It was necessary to proceed with endovaginal exam following the transabdominal exam to visualize the ovaries and endometrium. Color and duplex Doppler ultrasound was utilized to evaluate blood flow to the ovaries. COMPARISON:  01/24/2017 FINDINGS: Uterus Measurements: 8.9 x 4.4 x 4.9 cm = volume: 101.3 mL. No fibroids or other mass visualized. Endometrium Thickness: 13.4 mm.  No focal abnormality visualized. Right ovary Measurements:  3.7 x 2.5 x 3.3 cm = volume: 16.3 mL. Normal appearance/no adnexal mass. Left ovary Measurements: 4.5 x 3.5 x 4.4 cm = volume: 36.2 mL. 3.0 x 2.0 x 2.8 cm simple appearing cyst. Pulsed Doppler evaluation of both ovaries demonstrates normal low-resistance arterial and venous waveforms. Other findings No abnormal free fluid. IMPRESSION: Unremarkable pelvic ultrasound examination. Electronically Signed   By: Marijo Sanes M.D.   On: 08/09/2019 13:48    Procedures Procedures (including critical care time)  Medications Ordered in ED Medications  acetaminophen (TYLENOL) tablet 1,000 mg (1,000 mg Oral Given 08/09/19 1234)  iohexol (OMNIPAQUE) 300 MG/ML solution 100 mL (100 mLs Intravenous Contrast Given 08/09/19 1424)    ED Course  I have reviewed the triage vital signs and the nursing notes.  Pertinent labs & imaging results that were available during my care of the patient were reviewed by me and considered in my medical decision making (see chart for details).  39 year old female appears otherwise well presents for evaluation of sudden onset left pelvic pain 2 hours PTA.  Pain is significantly improved PTA.  History of PCOS and states pain similar to ovarian cyst rupture.  She has no concerns for STDs, sexually active with her husband.  No urinary symptoms, diarrhea.  No systemic symptoms.  LMP 3 weeks ago.  Plan for urine, pregnancy test, ultrasound and pelvic.  GU exam without CMT, adnexal tenderness. Pelvic ultrasound without any acute findings, no evidence of TOA, torsion or large cyst. Urinalysis negative for infection   Reevaluation discussed ultrasound findings with patient.  She continues to have some mild pain to her left lower quadrant however denies any gastrointestinal symptoms.  She is requesting CT scan.  Shared decision making with patient given her pain is improved.  Patient requesting CT scan.  Will obtain some labs.  She does not need the additional pain at this time.  Wet prep  with few WBC  Labs and imaging personally reviewed and  interpreted.  CBC without leukocytosis, hemoglobin stable at 14.0 BMP mild hyperglycemia to 105, no additional joint, renal or liver abnormality CT AP ovarian cyst.  There is right lymphadenopathy.  Recommends CT scan imaging in 3 months for reevaluation.  I discussed this with patient and she will follow-up with this with OB/GYN or her PCP.  Reevaluation abdomen is soft, nontender without rebound or guarding.  Nonsurgical abdomen.  She is tolerating p.o. intake without difficulty.   Patient is nontoxic, nonseptic appearing, in no apparent distress.  Patient's pain and other symptoms adequately managed in emergency department.  Fluid bolus given.  Labs, imaging and vitals reviewed.  Patient does not meet the SIRS or Sepsis criteria.  On repeat exam patient does not have a surgical abdomin and there are no peritoneal signs.  No indication of appendicitis, bowel obstruction, bowel perforation, cholecystitis, diverticulitis, PID, torsion, TOA or ectopic pregnancy.  Patient discharged home with symptomatic treatment and given strict instructions for follow-up with their primary care physician.  I have also discussed reasons to return immediately to the ER.  Patient expresses understanding and agrees with plan.  The patient has been appropriately medically screened and/or stabilized in the ED. I have low suspicion for any other emergent medical condition which would require further screening, evaluation or treatment in the ED or require inpatient management.  Patient is hemodynamically stable and in no acute distress.  Patient able to ambulate in department prior to ED.  Evaluation does not show acute pathology that would require ongoing or additional emergent interventions while in the emergency department or further inpatient treatment.  I have discussed the diagnosis with the patient and answered all questions.  Pain is been managed while in the  emergency department and patient has no further complaints prior to discharge.  Patient is comfortable with plan discussed in room and is stable for discharge at this time.  I have discussed strict return precautions for returning to the emergency department.  Patient was encouraged to follow-up with PCP/specialist refer to at discharge.      MDM Rules/Calculators/A&P                      Final Clinical Impression(s) / ED Diagnoses Final diagnoses:  Pelvic pain  LLQ pain    Rx / DC Orders ED Discharge Orders    None       Mckennon Zwart A, PA-C 08/09/19 1504    Long, Wonda [facility], MD 08/10/19 1404

## 2019-08-09 NOTE — Discharge Instructions (Signed)
Take tylenol or ibuprofen as needed for pain.  If you develop severe, worsening pain please return to the emergency department for evaluation.  As discussed in room you do have some lymphadenopathy in the right side of your pelvis/lower back.  Radiology is recommending CT reimaging in 3 months for reevaluation of this.  I would follow-up with your PCP or OB/GYN for this.

## 2019-08-09 NOTE — ED Triage Notes (Signed)
Sharp pain sudden onset to her lower abdomen and into her back. States the pain is not as severe now as earlier. Coughing makes the pain worse. She states she had the same pain when an ovarian cyst ruptured in the past.

## 2019-08-10 LAB — GC/CHLAMYDIA PROBE AMP (~~LOC~~) NOT AT ARMC
Chlamydia: NEGATIVE
Neisseria Gonorrhea: NEGATIVE

## 2019-09-30 ENCOUNTER — Telehealth: Payer: Self-pay | Admitting: Emergency Medicine

## 2019-09-30 MED ORDER — ALBUTEROL SULFATE HFA 108 (90 BASE) MCG/ACT IN AERS: 2.0000 | INHALATION_SPRAY | RESPIRATORY_TRACT | 0 refills | Status: DC | PRN

## 2019-09-30 NOTE — Telephone Encounter (Signed)
Spoke with pt, states she has just moved and started a new job, and has lost her albuterol inhaler in the move.  Requesting a sample.  I advised we do not carry samples in the office.  Sent 1 refill of albuterol to pharmacy.  I advised pt that it's been over 1 year since we've seen her in in clinic and need to schedule an OV in order to continue refilling her meds.  Pt states that she currently does not have insurance since just starting a new job, advised that once her insurance goes into effect she will call office to schedule OV.  I again stressed the importance of keeping follow up appts.  Pt expressed understanding.  Nothing further needed at this time- will close encounter.

## 2019-10-01 ENCOUNTER — Ambulatory Visit: Payer: No Typology Code available for payment source

## 2019-10-08 ENCOUNTER — Ambulatory Visit: Payer: Self-pay | Attending: Internal Medicine

## 2019-10-08 DIAGNOSIS — Z23 Encounter for immunization: Secondary | ICD-10-CM

## 2019-10-08 NOTE — Progress Notes (Signed)
   Covid-19 Vaccination Clinic  Name:  Brenda Williams    MRN: [id number] DOB: [birth year]  10/08/2019  Brenda Williams was observed post Covid-19 immunization for 15 minutes without incident. She was provided with Vaccine Information Sheet and instruction to access the V-Safe system.   Brenda Williams was instructed to call 911 with any severe reactions post vaccine: Marland Kitchen Difficulty breathing  . Swelling of face and throat  . A fast heartbeat  . A bad rash all over body  . Dizziness and weakness   Immunizations Administered    Name Date Dose VIS Date Route   Pfizer COVID-19 Vaccine 10/08/2019  3:02 PM 0.3 mL 06/24/2019 Intramuscular   Manufacturer: [place]   Lot: [id number]   [place]: [id number]

## 2019-11-02 ENCOUNTER — Ambulatory Visit: Payer: Self-pay | Attending: Internal Medicine

## 2019-11-02 DIAGNOSIS — Z23 Encounter for immunization: Secondary | ICD-10-CM

## 2019-11-02 NOTE — Progress Notes (Signed)
   Covid-19 Vaccination Clinic  Name:  Brenda Williams    MRN: [id number] DOB: January 05, 1981  11/02/2019  [name] was observed post Covid-19 immunization for 15 minutes without incident. She was provided with Vaccine Information Sheet and instruction to access the V-Safe system.   [name] was instructed to call 911 with any severe reactions post vaccine: Marland Kitchen Difficulty breathing  . Swelling of face and throat  . A fast heartbeat  . A bad rash all over body  . Dizziness and weakness   Immunizations Administered    Name Date Dose VIS Date Route   Pfizer COVID-19 Vaccine 11/02/2019  8:42 AM 0.3 mL 09/07/2018 Intramuscular   Manufacturer: [place]   Lot: [id number]   [place]: [id number]

## 2019-11-12 HISTORY — PX: OTHER SURGICAL HISTORY: SHX169

## 2020-01-17 ENCOUNTER — Telehealth: Payer: Self-pay | Admitting: Emergency Medicine

## 2020-01-17 DIAGNOSIS — J8481 Lymphangioleiomyomatosis: Secondary | ICD-10-CM

## 2020-01-17 DIAGNOSIS — R0602 Shortness of breath: Secondary | ICD-10-CM

## 2020-01-17 NOTE — Telephone Encounter (Signed)
Pt called back about this. Pt insists she needs pft and CT before upcoming byrum appt but no order exists. Please call pt to explain why we are unable to schedule these tests at this time. I explained to pt that we cannot schedule the tests without orders and that we would check with dr. Lamonte Sakai for further instruction. Pt still wanted to schedule pft.

## 2020-01-17 NOTE — Telephone Encounter (Signed)
Pt also sent a MyChart message in regards to this same matter. RB has to give the consent for a PFT to be done. I have sent the MyChart message to him to address.

## 2020-01-17 NOTE — Telephone Encounter (Signed)
New order has been placed. Routing back to [place]. Nothing further needed.

## 2020-01-17 NOTE — Telephone Encounter (Signed)
Will need a current CT order in order to schedule.  The CT order that we had for the one that was scheduled for 01/2019 has expired.  Will route to triage to have new order placed.

## 2020-01-18 ENCOUNTER — Ambulatory Visit (INDEPENDENT_AMBULATORY_CARE_PROVIDER_SITE_OTHER): Payer: BC Managed Care – PPO | Admitting: Emergency Medicine

## 2020-01-18 ENCOUNTER — Other Ambulatory Visit: Payer: Self-pay

## 2020-01-18 DIAGNOSIS — J8481 Lymphangioleiomyomatosis: Secondary | ICD-10-CM | POA: Diagnosis not present

## 2020-01-18 LAB — PULMONARY FUNCTION TEST
DL/VA % pred: 80 %
DL/VA: 3.49 ml/min/mmHg/L
DLCO cor % pred: 57 %
DLCO cor: 13.79 ml/min/mmHg
DLCO unc % pred: 57 %
DLCO unc: 13.79 ml/min/mmHg
FEF 25-75 Post: 1.25 L/sec
FEF 25-75 Pre: 1.33 L/sec
FEF2575-%Change-Post: -6 %
FEF2575-%Pred-Post: 40 %
FEF2575-%Pred-Pre: 43 %
FEV1-%Change-Post: -1 %
FEV1-%Pred-Post: 65 %
FEV1-%Pred-Pre: 66 %
FEV1-Post: 1.84 L
FEV1-Pre: 1.87 L
FEV1FVC-%Change-Post: 1 %
FEV1FVC-%Pred-Pre: 87 %
FEV6-%Change-Post: -3 %
FEV6-%Pred-Post: 73 %
FEV6-%Pred-Pre: 76 %
FEV6-Post: 2.48 L
FEV6-Pre: 2.57 L
FEV6FVC-%Change-Post: 0 %
FEV6FVC-%Pred-Post: 101 %
FEV6FVC-%Pred-Pre: 101 %
FVC-%Change-Post: -3 %
FVC-%Pred-Post: 72 %
FVC-%Pred-Pre: 74 %
FVC-Post: 2.48 L
FVC-Pre: 2.57 L
Post FEV1/FVC ratio: 74 %
Post FEV6/FVC ratio: 100 %
Pre FEV1/FVC ratio: 73 %
Pre FEV6/FVC Ratio: 100 %
RV % pred: 113 %
RV: 1.96 L
TLC % pred: 86 %
TLC: 4.75 L

## 2020-01-18 NOTE — Telephone Encounter (Signed)
Agree we need to get a CT chest w/o contrast and full PFT (both for LAM)  Also can repeat her split night sleep study - dx can be nocturnal SOB, non-restorative sleep.   She needs to follow up with me after completed to review and get her back on meds.

## 2020-01-18 NOTE — Progress Notes (Signed)
PFT completed today.  

## 2020-01-30 ENCOUNTER — Other Ambulatory Visit: Payer: Self-pay

## 2020-01-30 ENCOUNTER — Ambulatory Visit (HOSPITAL_BASED_OUTPATIENT_CLINIC_OR_DEPARTMENT_OTHER)
Admission: RE | Admit: 2020-01-30 | Discharge: 2020-01-30 | Disposition: A | Discharge status: Home / Self Care | Payer: BC Managed Care – PPO | Source: Ambulatory Visit | Attending: Emergency Medicine | Admitting: Emergency Medicine

## 2020-01-30 DIAGNOSIS — R0602 Shortness of breath: Secondary | ICD-10-CM | POA: Diagnosis not present

## 2020-01-30 DIAGNOSIS — J8481 Lymphangioleiomyomatosis: Secondary | ICD-10-CM | POA: Diagnosis not present

## 2020-02-11 ENCOUNTER — Ambulatory Visit (HOSPITAL_BASED_OUTPATIENT_CLINIC_OR_DEPARTMENT_OTHER): Payer: BC Managed Care – PPO | Attending: Emergency Medicine | Admitting: Pulmonary Disease

## 2020-02-11 ENCOUNTER — Other Ambulatory Visit: Payer: Self-pay

## 2020-02-11 DIAGNOSIS — R0602 Shortness of breath: Secondary | ICD-10-CM

## 2020-02-11 DIAGNOSIS — G4733 Obstructive sleep apnea (adult) (pediatric): Secondary | ICD-10-CM | POA: Insufficient documentation

## 2020-02-21 DIAGNOSIS — F4320 Adjustment disorder, unspecified: Secondary | ICD-10-CM | POA: Diagnosis not present

## 2020-03-03 DIAGNOSIS — F4320 Adjustment disorder, unspecified: Secondary | ICD-10-CM | POA: Diagnosis not present

## 2020-03-06 DIAGNOSIS — G4709 Other insomnia: Secondary | ICD-10-CM | POA: Diagnosis not present

## 2020-03-06 DIAGNOSIS — R0683 Snoring: Secondary | ICD-10-CM | POA: Diagnosis not present

## 2020-03-06 DIAGNOSIS — G4733 Obstructive sleep apnea (adult) (pediatric): Secondary | ICD-10-CM | POA: Diagnosis not present

## 2020-03-06 NOTE — Procedures (Signed)
Patient Name: Brenda Williams, Brenda Williams Date: 02/11/2020 Gender: Female D.O.B: [birth year] Age (years): 18 Referring Provider: Baltazar Apo Height (inches): 67 Interpreting Physician: Kara Mead MD, ABSM Weight (lbs): 240 RPSGT: Earney [place] BMI: 38 MRN: [id number] Neck Size: 16.00 <br> <br> CLINICAL INFORMATION Sleep Study Type: Split Night CPAP    Indication for sleep study: Excessive Daytime Sleepiness, Snoring    Epworth Sleepiness Score: 8  SLEEP STUDY TECHNIQUE As per the AASM Manual for the Scoring of Sleep and Associated Events v2.3 (April 2016) with a hypopnea requiring 4% desaturations.  The channels recorded and monitored were frontal, central and occipital EEG, electrooculogram (EOG), submentalis EMG (chin), nasal and oral airflow, thoracic and abdominal wall motion, anterior tibialis EMG, snore microphone, electrocardiogram, and pulse oximetry. Continuous positive airway pressure (CPAP) was initiated when the patient met split night criteria and was titrated according to treat sleep-disordered breathing.  MEDICATIONS Medications self-administered by patient taken the night of the study : N/A  RESPIRATORY PARAMETERS Diagnostic  Total AHI (/hr): 16.7 RDI (/hr): 19.6 OA Index (/hr): 3.7 CA Index (/hr): 0.4 REM AHI (/hr): N/A NREM AHI (/hr): 16.7 Supine AHI (/hr): 23.8 Non-supine AHI (/hr): 0 Min O2 Sat (%): 88.0 Mean O2 (%): 91.7 Time below 88% (min): 0.4   Titration  Optimal Pressure (cm): 10 AHI at Optimal Pressure (/hr): 0.0 Min O2 at Optimal Pressure (%): 89.0 Supine % at Optimal (%): 52 Sleep % at Optimal (%): 89   SLEEP ARCHITECTURE The recording time for the entire night was 365.2 minutes.  During a baseline period of 174.1 minutes, the patient slept for 147.0 minutes in REM and nonREM, yielding a sleep efficiency of 84.4%%. Sleep onset after lights out was 6.9 minutes with a REM latency of N/A minutes. The patient spent 4.4%% of the night in stage N1  sleep, 91.5%% in stage N2 sleep, 4.1%% in stage N3 and 0% in REM.    During the titration period of 183.1 minutes, the patient slept for 137.5 minutes in REM and nonREM, yielding a sleep efficiency of 75.1%%. Sleep onset after CPAP initiation was 23.3 minutes with a REM latency of 99.0 minutes. The patient spent 4.0%% of the night in stage N1 sleep, 74.5%% in stage N2 sleep, 0.0%% in stage N3 and 21.5% in REM.  CARDIAC DATA The 2 lead EKG demonstrated sinus rhythm. The mean heart rate was 100.0 beats per minute. Other EKG findings include: None.  LEG MOVEMENT DATA The total Periodic Limb Movements of Sleep (PLMS) were 0. The PLMS index was 0.0 .  IMPRESSIONS - Moderate obstructive sleep apnea occurred during the diagnostic portion of the study(AHI = 16.7/hour). Events were predominantly in supine sleep.An optimal PAP pressure was selected for this patient ( 10 cm of water) - No significant central sleep apnea occurred during the diagnostic portion of the study (CAI = 0.4/hour). - Mild oxygen desaturation was noted during the diagnostic portion of the study (Min O2 = 88.0%). - The patient snored with soft snoring volume during the diagnostic portion of the study. - No cardiac abnormalities were noted during this study. - Clinically significant periodic limb movements did not occur during sleep.   DIAGNOSIS - Obstructive Sleep Apnea (G47.33)   RECOMMENDATIONS - Trial of CPAP therapy on 10 cm H2O with a Small size Fisher&Paykel Full Face Mask Simplus mask and heated humidification. - Avoid alcohol, sedatives and other CNS depressants that may worsen sleep apnea and disrupt normal sleep architecture. - Sleep hygiene should be reviewed to assess factors  that may improve sleep quality. - Weight management and regular exercise should be initiated or continued. - Return to Sleep Center for re-evaluation after 4 weeks of therapy   Kara Mead MD Board Certified in [place]

## 2020-03-08 ENCOUNTER — Encounter: Payer: Self-pay | Admitting: Emergency Medicine

## 2020-03-08 ENCOUNTER — Other Ambulatory Visit: Payer: Self-pay

## 2020-03-08 ENCOUNTER — Ambulatory Visit: Payer: BC Managed Care – PPO | Admitting: Emergency Medicine

## 2020-03-08 DIAGNOSIS — R918 Other nonspecific abnormal finding of lung field: Secondary | ICD-10-CM

## 2020-03-08 DIAGNOSIS — G4733 Obstructive sleep apnea (adult) (pediatric): Secondary | ICD-10-CM | POA: Insufficient documentation

## 2020-03-08 DIAGNOSIS — J4520 Mild intermittent asthma, uncomplicated: Secondary | ICD-10-CM | POA: Diagnosis not present

## 2020-03-08 DIAGNOSIS — J8481 Lymphangioleiomyomatosis: Secondary | ICD-10-CM

## 2020-03-08 LAB — CBC WITH DIFFERENTIAL/PLATELET
Basophils Absolute: 0 10*3/uL (ref 0.0–0.1)
Basophils Relative: 0.7 % (ref 0.0–3.0)
Eosinophils Absolute: 0.1 10*3/uL (ref 0.0–0.7)
Eosinophils Relative: 1.3 % (ref 0.0–5.0)
HCT: 41.8 % (ref 36.0–46.0)
Hemoglobin: 13.5 g/dL (ref 12.0–15.0)
Lymphocytes Relative: 36.1 % (ref 12.0–46.0)
Lymphs Abs: 1.9 10*3/uL (ref 0.7–4.0)
MCHC: 32.2 g/dL (ref 30.0–36.0)
MCV: 88.7 fl (ref 78.0–100.0)
Monocytes Absolute: 0.3 10*3/uL (ref 0.1–1.0)
Monocytes Relative: 5 % (ref 3.0–12.0)
Neutro Abs: 3 10*3/uL (ref 1.4–7.7)
Neutrophils Relative %: 56.9 % (ref 43.0–77.0)
Platelets: 315 10*3/uL (ref 150.0–400.0)
RBC: 4.71 Mil/uL (ref 3.87–5.11)
RDW: 16.2 % — ABNORMAL HIGH (ref 11.5–15.5)
WBC: 5.3 10*3/uL (ref 4.0–10.5)

## 2020-03-08 LAB — HEPATIC FUNCTION PANEL
ALT: 11 U/L (ref 0–35)
AST: 11 U/L (ref 0–37)
Albumin: 4.1 g/dL (ref 3.5–5.2)
Alkaline Phosphatase: 51 U/L (ref 39–117)
Bilirubin, Direct: 0.1 mg/dL (ref 0.0–0.3)
Total Bilirubin: 0.4 mg/dL (ref 0.2–1.2)
Total Protein: 7.4 g/dL (ref 6.0–8.3)

## 2020-03-08 MED ORDER — SIROLIMUS 1 MG PO TABS: 1.0000 mg | ORAL_TABLET | Freq: Two times a day (BID) | ORAL | 1 refills | Status: DC

## 2020-03-08 MED ORDER — DULERA 100-5 MCG/ACT IN AERO: 2.0000 | INHALATION_SPRAY | Freq: Two times a day (BID) | RESPIRATORY_TRACT | 2 refills | Status: DC

## 2020-03-08 NOTE — Progress Notes (Signed)
Subjective:    Patient ID: Brenda Williams, female    DOB: [birth year], 39 y.o.   MRN: [id number]  HPI  ROV 03/08/20 --Brenda Williams follows up today.  39 year old woman with LAM discovered by biopsy with associated polycystic lung disease.  Also with associated obstructive lung disease not currently on bronchodilators.  We have been treating her with sirolimus and she reports that she is been off this medication since.  She has undergone surveillance studies, repeat CT scan, repeat PFT.  She also had a sleep study as below  Pulmonary function testing 01/18/2020 reviewed by me, shows moderate severe obstruction with an FEV1 1.87 L (66% predicted), no bronchodilator response, normal lung volumes with decreased diffusion capacity that corrects to the normal range when adjusted for alveolar volume.  Her FEV1 11/13/2017 was 2.26 L (79), in 09/17/2016 was 2.14 L (74). Split-night sleep study reviewed by me 02/11/2020 showed moderate OSA with an AHI of 16.7/h predominantly in supine position.  CPAP was titrated to 10 cmH2O for optimal control. A CT scan of the chest done on 01/30/2020 reviewed by me, shows diffuse cystic lung disease similar to her previous CT.  A 9 mm right middle lobe nodules unchanged compared with 06/03/2017.  She reports that her breathing has been more problematic - more difficult to do her daily chores, walking a distance or up stairs. She can tolerate slow pace.  She stopped the sirolimus in early 2020 do to fear from [place]. No real wheeze or cough. She does not use albuterol - very rare.  She has a lot of daytime fatigue, would nap if she cough. ? Whether she snores.   CPAP therapy on 10 cm H2O with a Small size Fisher&Paykel Full Face Mask Simplus mask and heated humidification  COVID vaccine up to date.   Most recent CBC was 08/09/2019, normal.   Review of Systems As per HPI     Objective:   Physical Exam Vitals:   03/08/20 0903  BP: 132/70  Pulse: 81  Temp: 97.6 F (36.4  C)  TempSrc: Temporal  SpO2: 96%  Weight: 241 lb (109.3 kg)  Height: 5\' 7"  (1.702 m)   Body mass index is 37.75 kg/m.  Gen: Pleasant, obese woman, in no distress,  normal affect  ENT: No lesions,  mouth clear,  oropharynx clear, no postnasal drip  Neck: No JVD, no stridor  Lungs: No use of accessory muscles, distant, clear without rales or rhonchi  Cardiovascular: RRR, heart sounds normal, no murmur or gallops, no peripheral edema  Musculoskeletal: No deformities, no cyanosis or clubbing  Neuro: alert, non focal  Skin: Warm, no lesions or rashes      Assessment & Plan:    Lymphangioleiomyomatosis (HCC) Off sirolimus. She has progression of her obstructive disease on pulmonary function testing, concerning for progression of her LAM. CT scan shows numerous cysts. She needs to be back on the sirolimus. We will check CBC and LFT today, restart 1 mg twice daily. We may need to uptitrate and follow levels. I will repeat her labs at next visit.  Mild intermittent asthma Again likely associated with LAM. She has dyspnea, rarely uses albuterol. She is willing to restart Symbicort to see if she gets benefit we will do so today.  Pulmonary nodules 9 mm right middle lobe pulmonary nodule stable in size since 06/03/2017. Should not need to follow this any further, benign.  Obstructive sleep apnea Identified on her PSG from 02/11/2020. She is willing to undertake  a trial of CPAP and we will order today. She was titrated to 10 cm water.  Baltazar Apo, MD, PhD 03/08/2020, 9:32 AM [facility] [phone] or if no answer [phone]

## 2020-03-08 NOTE — Assessment & Plan Note (Signed)
Off sirolimus. She has progression of her obstructive disease on pulmonary function testing, concerning for progression of her LAM. CT scan shows numerous cysts. She needs to be back on the sirolimus. We will check CBC and LFT today, restart 1 mg twice daily. We may need to uptitrate and follow levels. I will repeat her labs at next visit.

## 2020-03-08 NOTE — Patient Instructions (Addendum)
We will restart your sirolimus 1 mg twice a day. Blood work today and at your next visit We will start Symbicort 80/4.5 mcg, 2 puffs twice a day. Rinse and gargle after using. Keep your albuterol available use 2 puffs if needed for shortness of breath, chest tightness, wheezing. We will start CPAP 10 cmH2O. Mask: Small size Fisher&Paykel Full Face Mask Simplus mask and heated humidification Follow with Dr Lamonte Sakai in 1 month or next available. We will do repeat lab work on that day.

## 2020-03-08 NOTE — Assessment & Plan Note (Signed)
Again likely associated with LAM. She has dyspnea, rarely uses albuterol. She is willing to restart Symbicort to see if she gets benefit we will do so today.

## 2020-03-08 NOTE — Assessment & Plan Note (Signed)
9 mm right middle lobe pulmonary nodule stable in size since 06/03/2017. Should not need to follow this any further, benign.

## 2020-03-08 NOTE — Assessment & Plan Note (Signed)
Identified on her PSG from 02/11/2020. She is willing to undertake a trial of CPAP and we will order today. She was titrated to 10 cm water.

## 2020-03-10 DIAGNOSIS — F4320 Adjustment disorder, unspecified: Secondary | ICD-10-CM | POA: Diagnosis not present

## 2020-03-17 DIAGNOSIS — F4320 Adjustment disorder, unspecified: Secondary | ICD-10-CM | POA: Diagnosis not present

## 2020-03-22 ENCOUNTER — Other Ambulatory Visit: Payer: Self-pay

## 2020-03-22 ENCOUNTER — Ambulatory Visit
Admission: EM | Admit: 2020-03-22 | Discharge: 2020-03-22 | Disposition: A | Discharge status: Home / Self Care | Payer: BC Managed Care – PPO | Attending: Physician Assistant | Admitting: Physician Assistant

## 2020-03-22 DIAGNOSIS — R059 Cough, unspecified: Secondary | ICD-10-CM

## 2020-03-22 DIAGNOSIS — Z1152 Encounter for screening for COVID-19: Secondary | ICD-10-CM

## 2020-03-22 DIAGNOSIS — J3489 Other specified disorders of nose and nasal sinuses: Secondary | ICD-10-CM

## 2020-03-22 DIAGNOSIS — J392 Other diseases of pharynx: Secondary | ICD-10-CM

## 2020-03-22 MED ORDER — AZELASTINE HCL 0.1 % NA SOLN: 2.0000 | Freq: Two times a day (BID) | NASAL | 0 refills | Status: DC

## 2020-03-22 MED ORDER — BENZONATATE 200 MG PO CAPS: 200.0000 mg | ORAL_CAPSULE | Freq: Three times a day (TID) | ORAL | 0 refills | Status: DC

## 2020-03-22 MED ORDER — FLUTICASONE PROPIONATE 50 MCG/ACT NA SUSP: 2.0000 | Freq: Every day | NASAL | 0 refills | Status: DC

## 2020-03-22 NOTE — ED Provider Notes (Signed)
[facility]    CSN: [id number] Arrival date & time: 03/22/20  0109      History   Chief Complaint Chief Complaint  Patient presents with  . Cough    HPI Brenda Williams is a 39 y.o. female.   39 year old female comes in for 2 day of URI symptoms. Cough, nasal congestion, scratching throat, body aches, rhinorrhea. Denies fever, chills. Denies abdominal pain, nausea, vomiting, diarrhea. Denies loss of taste/smell. Has baseline shortness of breath due to asthma, no changes. Negative Rapid COVID. Never smoker.      Past Medical History:  Diagnosis Date  . Abnormal Pap smear 2006   leep  . Anxiety   . Arthritis    osteoarthritis  . Complication of anesthesia    [facility]c [facility], HAD ??  SEIZURE/ SHAKING  . Depression   . GERD (gastroesophageal reflux disease)   . Herpes    never had outbreak. pos per blood, doesn't know which type  . Lactose intolerance   . Lymphangiomatosis   . Polycystic ovarian syndrome   . Prediabetes   . SVD (spontaneous vaginal delivery)    x 1  . Varicose vein of leg   . Vitamin D deficiency     Patient Active Problem List   Diagnosis Date Noted  . Obstructive sleep apnea 03/08/2020  . MDD (major depressive disorder), recurrent episode, severe ([place]) 11/18/2017  . Mild intermittent asthma 06/12/2017  . Depression 03/17/2017  . Vitamin D deficiency 03/17/2017  . Class 2 obesity with serious comorbidity and body mass index (BMI) of 35.0 to 35.9 in adult 03/17/2017  . Prediabetes 02/23/2017  . Lymphangioleiomyomatosis ([place]) 12/28/2015  . Obesity 01/12/2015  . Chronic fatigue 12/01/2014  . Pulmonary nodules 06/28/2013  . Cervix abnormality 05/12/2011  . Polycystic ovaries 05/12/2011  . Female infertility of unspecified origin 05/12/2011    Past Surgical History:  Procedure Laterality Date  . DILATION AND EVACUATION N/A 09/14/2012   Procedure: DILATATION AND EVACUATION;  Surgeon: Allena Katz, MD;  Location: [facility];  Service: Gynecology;  Laterality: N/A;  . HYSTEROSCOPY  2009   uterine polyp  . IVF Retrival  2010, 2011  . KNEE SURGERY  1998   right knee  . LEEP  2006  . LUNG BIOPSY Right 12/28/2015   Procedure: RIGHT LUNG BIOPSY;  Surgeon: Ivin Poot, MD;  Location: [facility];  Service: Thoracic;  Laterality: Right;  . SPHINCTEROTOMY  2010  . uterine polyp removal    . VIDEO ASSISTED THORACOSCOPY Right 12/28/2015   Procedure: RIGHT VIDEO ASSISTED THORACOSCOPY;  Surgeon: Ivin Poot, MD;  Location: [facility];  Service: Thoracic;  Laterality: Right;    OB History    Gravida  5   Para  1   Term  1   Preterm  0   AB  2   Living  1     SAB  1   TAB  0   Ectopic  1   Multiple  0   Live Births  1            Home Medications    Prior to Admission medications   Medication Sig Start Date End Date Taking? Authorizing Provider  albuterol (VENTOLIN HFA) 108 (90 Base) MCG/ACT inhaler Inhale 2 puffs into the lungs every 4 (four) hours as needed for wheezing or shortness of breath. 09/30/19   Collene Gobble, MD  azelastine (ASTELIN) 0.1 % nasal spray Place 2  sprays into both nostrils 2 (two) times daily. 03/22/20   Tasia Catchings, Kialee Kham V, PA-C  benzonatate (TESSALON) 200 MG capsule Take 1 capsule (200 mg total) by mouth every 8 (eight) hours. 03/22/20   Tasia Catchings, Nea Gittens V, PA-C  fluticasone (FLONASE) 50 MCG/ACT nasal spray Place 2 sprays into both nostrils daily. 03/22/20   Tasia Catchings, Arielys Wandersee V, PA-C  mometasone-formoterol (DULERA) 100-5 MCG/ACT AERO Inhale 2 puffs into the lungs in the morning and at bedtime. 03/08/20   Collene Gobble, MD  sirolimus (RAPAMUNE) 1 MG tablet Take 1 tablet (1 mg total) by mouth 2 (two) times daily. 03/08/20   Collene Gobble, MD  phentermine (ADIPEX-P) 37.5 MG tablet Take 1 tablet (37.5 mg total) by mouth daily before breakfast. 01/08/18 06/02/19  Shelda Pal, DO    Family History Family History  Problem Relation Age of Onset  . Diabetes Maternal Grandmother   . Heart disease  Maternal Grandmother   . Hypertension Maternal Grandfather   . Cancer Maternal Grandfather 70       bone, colon   . Diabetes Maternal Grandfather   . Diabetes Mother   . Cancer Mother 12       breast cancer  . Hyperlipidemia Mother   . Depression Mother   . Obesity Mother   . Hypertension Father   . Hyperlipidemia Father   . Thyroid disease Father   . Alcohol abuse Father   . Drug abuse Father   . Obesity Father   . Anesthesia problems Neg Hx     Social History Social History   Tobacco Use  . Smoking status: Never Smoker  . Smokeless tobacco: Never Used  Vaping Use  . Vaping Use: Never used  Substance Use Topics  . Alcohol use: Not Currently    Alcohol/week: 0.0 standard drinks    Comment: OCC  . Drug use: No     Allergies   Fentanyl   Review of Systems Review of Systems  Reason unable to perform ROS: See HPI as above.     Physical Exam Triage Vital Signs ED Triage Vitals [03/22/20 0936]  Enc Vitals Group     BP (!) 137/91     Pulse Rate 79     Resp 18     Temp 98.1 F (36.7 C)     Temp Source Oral     SpO2 94 %     Weight      Height      Head Circumference      Peak Flow      Pain Score 4     Pain Loc      Pain Edu?      Excl. in [place]?    No data found.  Updated Vital Signs BP (!) 137/91 (BP Location: Left Arm)   Pulse 79   Temp 98.1 F (36.7 C) (Oral)   Resp 18   LMP 03/01/2020   SpO2 94%   Physical Exam Constitutional:      General: She is not in acute distress.    Appearance: Normal appearance. She is well-developed. She is not ill-appearing, toxic-appearing or diaphoretic.  HENT:     Head: Normocephalic and atraumatic.     Right Ear: Ear canal and external ear normal. A middle ear effusion is present. Tympanic membrane is not erythematous or bulging.     Left Ear: Ear canal and external ear normal. A middle ear effusion is present. Tympanic membrane is not erythematous or bulging.  Nose:     Right Sinus: No maxillary sinus  tenderness or frontal sinus tenderness.     Left Sinus: No maxillary sinus tenderness or frontal sinus tenderness.     Mouth/Throat:     Mouth: Mucous membranes are moist.     Pharynx: Oropharynx is clear. Uvula midline.  Eyes:     Conjunctiva/sclera: Conjunctivae normal.     Pupils: Pupils are equal, round, and reactive to light.  Cardiovascular:     Rate and Rhythm: Normal rate and regular rhythm.  Pulmonary:     Effort: Pulmonary effort is normal. No accessory muscle usage, prolonged expiration, respiratory distress or retractions.     Breath sounds: No decreased air movement or transmitted upper airway sounds. No decreased breath sounds.     Comments: LCTAB Musculoskeletal:     Cervical back: Normal range of motion and neck supple.  Skin:    General: Skin is warm and dry.  Neurological:     Mental Status: She is alert and oriented to person, place, and time.      UC Treatments / Results  Labs (all labs ordered are listed, but only abnormal results are displayed) Labs Reviewed  NOVEL CORONAVIRUS, NAA    EKG   Radiology No results found.  Procedures Procedures (including critical care time)  Medications Ordered in UC Medications - No data to display  Initial Impression / Assessment and Plan / UC Course  I have reviewed the triage vital signs and the nursing notes.  Pertinent labs & imaging results that were available during my care of the patient were reviewed by me and considered in my medical decision making (see chart for details).    COVID PCR test ordered. Patient to quarantine until testing results return. No alarming signs on exam. LCTAB. Symptomatic treatment discussed.  Push fluids.  Return precautions given.  Patient expresses understanding and agrees to plan.  Final Clinical Impressions(s) / UC Diagnoses   Final diagnoses:  Encounter for screening for COVID-19  Rhinorrhea  Cough  Throat irritation    ED Prescriptions    Medication Sig Dispense  Auth. Provider   fluticasone (FLONASE) 50 MCG/ACT nasal spray Place 2 sprays into both nostrils daily. 1 g Rafe Mackowski V, PA-C   azelastine (ASTELIN) 0.1 % nasal spray Place 2 sprays into both nostrils 2 (two) times daily. 30 mL Da Authement V, PA-C   benzonatate (TESSALON) 200 MG capsule Take 1 capsule (200 mg total) by mouth every 8 (eight) hours. 21 capsule Ok Edwards, PA-C     PDMP not reviewed this encounter.   Ok Edwards, PA-C 03/22/20 1004

## 2020-03-22 NOTE — Discharge Instructions (Addendum)
COVID PCR testing ordered. I would like you to quarantine until testing results. Tessalon for cough if needed. Start flonase, azelastine nasal spray for nasal congestion/drainage. You can use over the counter nasal saline rinse such as neti pot for nasal congestion. Keep hydrated, your urine should be clear to pale yellow in color. Tylenol/motrin for fever and pain. If experiencing shortness of breath, trouble breathing, go to the emergency department for further evaluation needed.

## 2020-03-22 NOTE — ED Triage Notes (Signed)
Pt c/o cough, nasal congestion, scratchy throat, and body aches since Monday night. States had a neg rapid covid on Tuesday.

## 2020-03-23 ENCOUNTER — Telehealth: Payer: Self-pay | Admitting: Emergency Medicine

## 2020-03-23 NOTE — Telephone Encounter (Signed)
Yes. Please insure that she has follow up with me - we will need to check her labs since we started the medication

## 2020-03-23 NOTE — Telephone Encounter (Signed)
Dr. Lamonte Sakai, can triage send in this prescription? The pharmacy is requesting a new prescription.  "calling to get a new prescription for the RAPAMUNE 2mg "

## 2020-03-24 LAB — NOVEL CORONAVIRUS, NAA: SARS-CoV-2, NAA: NOT DETECTED

## 2020-03-24 LAB — SARS-COV-2, NAA 2 DAY TAT

## 2020-03-24 MED ORDER — SIROLIMUS 1 MG PO TABS
1.0000 mg | ORAL_TABLET | Freq: Two times a day (BID) | ORAL | 1 refills | Status: DC
Start: 2020-03-24 — End: 2023-09-10

## 2020-03-24 NOTE — Telephone Encounter (Signed)
Pt does have a f/u already scheduled with RB. Rx has been electronically sent to [facility] for pt. Nothing further needed.

## 2020-03-27 ENCOUNTER — Telehealth: Payer: Self-pay | Admitting: Family Medicine

## 2020-03-27 NOTE — Telephone Encounter (Signed)
Called pt from  [place] message from  6 days ago. Patient said she was feeling better and no appt was needed.

## 2020-04-12 ENCOUNTER — Ambulatory Visit: Payer: BC Managed Care – PPO | Admitting: Emergency Medicine

## 2020-05-14 ENCOUNTER — Ambulatory Visit: Payer: BC Managed Care – PPO | Admitting: Emergency Medicine

## 2020-05-16 DIAGNOSIS — N912 Amenorrhea, unspecified: Secondary | ICD-10-CM | POA: Diagnosis not present

## 2020-06-13 ENCOUNTER — Ambulatory Visit: Payer: BC Managed Care – PPO | Admitting: Family Medicine

## 2020-06-13 ENCOUNTER — Encounter: Payer: Self-pay | Admitting: Family Medicine

## 2020-06-13 ENCOUNTER — Other Ambulatory Visit: Payer: Self-pay

## 2020-06-13 VITALS — BP 118/76 | HR 80 | Temp 97.8°F | Ht 67.0 in | Wt 245.4 lb

## 2020-06-13 DIAGNOSIS — M545 Low back pain, unspecified: Secondary | ICD-10-CM | POA: Diagnosis not present

## 2020-06-13 DIAGNOSIS — R140 Abdominal distension (gaseous): Secondary | ICD-10-CM | POA: Diagnosis not present

## 2020-06-13 DIAGNOSIS — R109 Unspecified abdominal pain: Secondary | ICD-10-CM | POA: Diagnosis not present

## 2020-06-13 DIAGNOSIS — K644 Residual hemorrhoidal skin tags: Secondary | ICD-10-CM

## 2020-06-13 MED ORDER — PANTOPRAZOLE SODIUM 40 MG PO TBEC: 40.0000 mg | DELAYED_RELEASE_TABLET | Freq: Every day | ORAL | 1 refills | Status: DC

## 2020-06-13 NOTE — Progress Notes (Signed)
Chief Complaint  Patient presents with  . GI Problem  . Nausea    Brenda Williams is here for abdominal pain.  Duration: 2 months  Was having pain due to bloating, intermittent cramping lasting 2-3 minutes. Nighttime awakenings? Yes from last night Bleeding? Only related to hemorrhoids Weight loss? No Palliation: laying on side Provocation: meals Associated symptoms: nausea; had diarrhea last night Denies: fever and vomiting Treatment to date: None  Past Medical History:  Diagnosis Date  . Abnormal Pap smear 2006   leep  . Anxiety   . Arthritis    osteoarthritis  . Complication of anesthesia    [facility], HAD ??  SEIZURE/ SHAKING  . Depression   . GERD (gastroesophageal reflux disease)   . Herpes    never had outbreak. pos per blood, doesn't know which type  . Lactose intolerance   . Lymphangiomatosis   . Polycystic ovarian syndrome   . Prediabetes   . SVD (spontaneous vaginal delivery)    x 1  . Varicose vein of leg   . Vitamin D deficiency     BP 118/76 (BP Location: Left Arm, Patient Position: Sitting, Cuff Size: Large)   Pulse 80   Temp 97.8 F (36.6 C) (Oral)   Ht 5\' 7"  (1.702 m)   Wt 245 lb 6 oz (111.3 kg)   SpO2 95%   BMI 38.43 kg/m  Gen.: Awake, alert, appears stated age 10: Mucous membranes moist without mucosal lesions Heart: Regular rate and rhythm without murmurs Lungs: Clear auscultation bilaterally, no rales or wheezing, normal effort without accessory muscle use. Abdomen: Bowel sounds are present. Abdomen is soft, diffuse discomfort to palpation, mildly distended, no masses or organomegaly. Negative Murphy's, Rovsing's, McBurney's, and Carnett's sign. MSK: +TTp over R lumbar parasp msc, neg Straight leg, poor hamstring rom b/l Psych: Age appropriate judgment and insight. Normal mood and affect.  Abdominal discomfort - Plan: Ambulatory referral to Gastroenterology, pantoprazole (PROTONIX) 40 MG tablet  Abdominal bloating -  Plan: Ambulatory referral to Gastroenterology, pantoprazole (PROTONIX) 40 MG tablet  External hemorrhoids - Plan: Ambulatory referral to Gastroenterology  Left-sided low back pain without sciatica, unspecified chronicity  Refer GI. Start Protonix daily. Reflux precautions discussed.  Stretches/exercises, heat, ice Tylenol, hold off on NSAIDs as they can worsen above.  F/u for CPE at convenience. Pt voiced understanding and agreement to the plan.  [place], DO 06/13/20 4:28 PM

## 2020-06-13 NOTE — Patient Instructions (Signed)
If you do not hear anything about your referral in the next 1-2 weeks, call our office and ask for an update.  The only lifestyle changes that have data behind them are weight loss for the overweight/obese and elevating the head of the bed. Finding out which foods/positions are triggers is important.  Heat (pad or rice pillow in microwave) over affected area, 10-15 minutes twice daily.   Ice/cold pack over area for 10-15 min twice daily.  OK to take Tylenol 1000 mg (2 extra strength tabs) or 975 mg (3 regular strength tabs) every 6 hours as needed.  Let us know if you need anything.  EXERCISES  RANGE OF MOTION (ROM) AND STRETCHING EXERCISES - Low Back Pain Most people with lower back pain will find that their symptoms get worse with excessive bending forward (flexion) or arching at the lower back (extension). The exercises that will help resolve your symptoms will focus on the opposite motion.  If you have pain, numbness or tingling which travels down into your buttocks, leg or foot, the goal of the therapy is for these symptoms to move closer to your back and eventually resolve. Sometimes, these leg symptoms will get better, but your lower back pain may worsen. This is often an indication of progress in your rehabilitation. Be very alert to any changes in your symptoms and the activities in which you participated in the 24 hours prior to the change. Sharing this information with your caregiver will allow him or her to most efficiently treat your condition. These exercises may help you when beginning to rehabilitate your injury. Your symptoms may resolve with or without further involvement from your physician, physical therapist or athletic trainer. While completing these exercises, remember:   Restoring tissue flexibility helps normal motion to return to the joints. This allows healthier, less painful movement and activity.  An effective stretch should be held for at least 30 seconds.  A  stretch should never be painful. You should only feel a gentle lengthening or release in the stretched tissue. FLEXION RANGE OF MOTION AND STRETCHING EXERCISES:  STRETCH - Flexion, Single Knee to Chest   Lie on a firm bed or floor with both legs extended in front of you.  Keeping one leg in contact with the floor, bring your opposite knee to your chest. Hold your leg in place by either grabbing behind your thigh or at your knee.  Pull until you feel a gentle stretch in your low back. Hold 30 seconds.  Slowly release your grasp and repeat the exercise with the opposite side. Repeat 2 times. Complete this exercise 3 times per week.   STRETCH - Flexion, Double Knee to Chest  Lie on a firm bed or floor with both legs extended in front of you.  Keeping one leg in contact with the floor, bring your opposite knee to your chest.  Tense your stomach muscles to support your back and then lift your other knee to your chest. Hold your legs in place by either grabbing behind your thighs or at your knees.  Pull both knees toward your chest until you feel a gentle stretch in your low back. Hold 30 seconds.  Tense your stomach muscles and slowly return one leg at a time to the floor. Repeat 2 times. Complete this exercise 3 times per week.   STRETCH - Low Trunk Rotation  Lie on a firm bed or floor. Keeping your legs in front of you, bend your knees so they are both  pointed toward the ceiling and your feet are flat on the floor.  Extend your arms out to the side. This will stabilize your upper body by keeping your shoulders in contact with the floor.  Gently and slowly drop both knees together to one side until you feel a gentle stretch in your low back. Hold for 30 seconds.  Tense your stomach muscles to support your lower back as you bring your knees back to the starting position. Repeat the exercise to the other side. Repeat 2 times. Complete this exercise at least 3 times per week.    EXTENSION RANGE OF MOTION AND FLEXIBILITY EXERCISES:  STRETCH - Extension, Prone on Elbows   Lie on your stomach on the floor, a bed will be too soft. Place your palms about shoulder width apart and at the height of your head.  Place your elbows under your shoulders. If this is too painful, stack pillows under your chest.  Allow your body to relax so that your hips drop lower and make contact more completely with the floor.  Hold this position for 30 seconds.  Slowly return to lying flat on the floor. Repeat 2 times. Complete this exercise 3 times per week.   RANGE OF MOTION - Extension, Prone Press Ups  Lie on your stomach on the floor, a bed will be too soft. Place your palms about shoulder width apart and at the height of your head.  Keeping your back as relaxed as possible, slowly straighten your elbows while keeping your hips on the floor. You may adjust the placement of your hands to maximize your comfort. As you gain motion, your hands will come more underneath your shoulders.  Hold this position 30 seconds.  Slowly return to lying flat on the floor. Repeat 2 times. Complete this exercise 3 times per week.   RANGE OF MOTION- Quadruped, Neutral Spine   Assume a hands and knees position on a firm surface. Keep your hands under your shoulders and your knees under your hips. You may place padding under your knees for comfort.  Drop your head and point your tailbone toward the ground below you. This will round out your lower back like an angry cat. Hold this position for 30 seconds.  Slowly lift your head and release your tail bone so that your back sags into a large arch, like an old horse.  Hold this position for 30 seconds.  Repeat this until you feel limber in your low back.  Now, find your "sweet spot." This will be the most comfortable position somewhere between the two previous positions. This is your neutral spine. Once you have found this position, tense your  stomach muscles to support your low back.  Hold this position for 30 seconds. Repeat 2 times. Complete this exercise 3 times per week.   STRENGTHENING EXERCISES - Low Back Sprain These exercises may help you when beginning to rehabilitate your injury. These exercises should be done near your "sweet spot." This is the neutral, low-back arch, somewhere between fully rounded and fully arched, that is your least painful position. When performed in this safe range of motion, these exercises can be used for people who have either a flexion or extension based injury. These exercises may resolve your symptoms with or without further involvement from your physician, physical therapist or athletic trainer. While completing these exercises, remember:   Muscles can gain both the endurance and the strength needed for everyday activities through controlled exercises.  Complete these exercises  as instructed by your physician, physical therapist or athletic trainer. Increase the resistance and repetitions only as guided.  You may experience muscle soreness or fatigue, but the pain or discomfort you are trying to eliminate should never worsen during these exercises. If this pain does worsen, stop and make certain you are following the directions exactly. If the pain is still present after adjustments, discontinue the exercise until you can discuss the trouble with your caregiver.  STRENGTHENING - Deep Abdominals, Pelvic Tilt   Lie on a firm bed or floor. Keeping your legs in front of you, bend your knees so they are both pointed toward the ceiling and your feet are flat on the floor.  Tense your lower abdominal muscles to press your low back into the floor. This motion will rotate your pelvis so that your tail bone is scooping upwards rather than pointing at your feet or into the floor. With a gentle tension and even breathing, hold this position for 3 seconds. Repeat 2 times. Complete this exercise 3 times per  week.   STRENGTHENING - Abdominals, Crunches   Lie on a firm bed or floor. Keeping your legs in front of you, bend your knees so they are both pointed toward the ceiling and your feet are flat on the floor. Cross your arms over your chest.  Slightly tip your chin down without bending your neck.  Tense your abdominals and slowly lift your trunk high enough to just clear your shoulder blades. Lifting higher can put excessive stress on the lower back and does not further strengthen your abdominal muscles.  Control your return to the starting position. Repeat 2 times. Complete this exercise 3 times per week.   STRENGTHENING - Quadruped, Opposite UE/LE Lift   Assume a hands and knees position on a firm surface. Keep your hands under your shoulders and your knees under your hips. You may place padding under your knees for comfort.  Find your neutral spine and gently tense your abdominal muscles so that you can maintain this position. Your shoulders and hips should form a rectangle that is parallel with the floor and is not twisted.  Keeping your trunk steady, lift your right hand no higher than your shoulder and then your left leg no higher than your hip. Make sure you are not holding your breath. Hold this position for 30 seconds.  Continuing to keep your abdominal muscles tense and your back steady, slowly return to your starting position. Repeat with the opposite arm and leg. Repeat 2 times. Complete this exercise 3 times per week.   STRENGTHENING - Abdominals and Quadriceps, Straight Leg Raise   Lie on a firm bed or floor with both legs extended in front of you.  Keeping one leg in contact with the floor, bend the other knee so that your foot can rest flat on the floor.  Find your neutral spine, and tense your abdominal muscles to maintain your spinal position throughout the exercise.  Slowly lift your straight leg off the floor about 6 inches for a count of 3, making sure to not hold  your breath.  Still keeping your neutral spine, slowly lower your leg all the way to the floor. Repeat this exercise with each leg 2 times. Complete this exercise 3 times per week.  POSTURE AND BODY MECHANICS CONSIDERATIONS - Low Back Sprain Keeping correct posture when sitting, standing or completing your activities will reduce the stress put on different body tissues, allowing injured tissues a chance to  heal and limiting painful experiences. The following are general guidelines for improved posture.  While reading these guidelines, remember:  The exercises prescribed by your provider will help you have the flexibility and strength to maintain correct postures.  The correct posture provides the best environment for your joints to work. All of your joints have less wear and tear when properly supported by a spine with good posture. This means you will experience a healthier, less painful body.  Correct posture must be practiced with all of your activities, especially prolonged sitting and standing. Correct posture is as important when doing repetitive low-stress activities (typing) as it is when doing a single heavy-load activity (lifting).  RESTING POSITIONS Consider which positions are most painful for you when choosing a resting position. If you have pain with flexion-based activities (sitting, bending, stooping, squatting), choose a position that allows you to rest in a less flexed posture. You would want to avoid curling into a fetal position on your side. If your pain worsens with extension-based activities (prolonged standing, working overhead), avoid resting in an extended position such as sleeping on your stomach. Most people will find more comfort when they rest with their spine in a more neutral position, neither too rounded nor too arched. Lying on a non-sagging bed on your side with a pillow between your knees, or on your back with a pillow under your knees will often provide some  relief. Keep in mind, being in any one position for a prolonged period of time, no matter how correct your posture, can still lead to stiffness.  PROPER SITTING POSTURE In order to minimize stress and discomfort on your spine, you must sit with correct posture. Sitting with good posture should be effortless for a healthy body. Returning to good posture is a gradual process. Many people can work toward this most comfortably by using various supports until they have the flexibility and strength to maintain this posture on their own. When sitting with proper posture, your ears will fall over your shoulders and your shoulders will fall over your hips. You should use the back of the chair to support your upper back. Your lower back will be in a neutral position, just slightly arched. You may place a small pillow or folded towel at the base of your lower back for  support.  When working at a desk, create an environment that supports good, upright posture. Without extra support, muscles tire, which leads to excessive strain on joints and other tissues. Keep these recommendations in mind:  CHAIR:  A chair should be able to slide under your desk when your back makes contact with the back of the chair. This allows you to work closely.  The chair's height should allow your eyes to be level with the upper part of your monitor and your hands to be slightly lower than your elbows.  BODY POSITION  Your feet should make contact with the floor. If this is not possible, use a foot rest.  Keep your ears over your shoulders. This will reduce stress on your neck and low back.  INCORRECT SITTING POSTURES  If you are feeling tired and unable to assume a healthy sitting posture, do not slouch or slump. This puts excessive strain on your back tissues, causing more damage and pain. Healthier options include:  Using more support, like a lumbar pillow.  Switching tasks to something that requires you to be upright or  walking.  Talking a brief walk.  Lying down to rest  in a neutral-spine position.  PROLONGED STANDING WHILE SLIGHTLY LEANING FORWARD  When completing a task that requires you to lean forward while standing in one place for a long time, place either foot up on a stationary 2-4 inch high object to help maintain the best posture. When both feet are on the ground, the lower back tends to lose its slight inward curve. If this curve flattens (or becomes too large), then the back and your other joints will experience too much stress, tire more quickly, and can cause pain.  CORRECT STANDING POSTURES Proper standing posture should be assumed with all daily activities, even if they only take a few moments, like when brushing your teeth. As in sitting, your ears should fall over your shoulders and your shoulders should fall over your hips. You should keep a slight tension in your abdominal muscles to brace your spine. Your tailbone should point down to the ground, not behind your body, resulting in an over-extended swayback posture.   INCORRECT STANDING POSTURES  Common incorrect standing postures include a forward head, locked knees and/or an excessive swayback. WALKING Walk with an upright posture. Your ears, shoulders and hips should all line-up.  PROLONGED ACTIVITY IN A FLEXED POSITION When completing a task that requires you to bend forward at your waist or lean over a low surface, try to find a way to stabilize 3 out of 4 of your limbs. You can place a hand or elbow on your thigh or rest a knee on the surface you are reaching across. This will provide you more stability, so that your muscles do not tire as quickly. By keeping your knees relaxed, or slightly bent, you will also reduce stress across your lower back. CORRECT LIFTING TECHNIQUES  DO :  Assume a wide stance. This will provide you more stability and the opportunity to get as close as possible to the object which you are lifting.  Tense  your abdominals to brace your spine. Bend at the knees and hips. Keeping your back locked in a neutral-spine position, lift using your leg muscles. Lift with your legs, keeping your back straight.  Test the weight of unknown objects before attempting to lift them.  Try to keep your elbows locked down at your sides in order get the best strength from your shoulders when carrying an object.     Always ask for help when lifting heavy or awkward objects. INCORRECT LIFTING TECHNIQUES DO NOT:   Lock your knees when lifting, even if it is a small object.  Bend and twist. Pivot at your feet or move your feet when needing to change directions.  Assume that you can safely pick up even a paperclip without proper posture.

## 2020-06-22 DIAGNOSIS — N390 Urinary tract infection, site not specified: Secondary | ICD-10-CM | POA: Insufficient documentation

## 2020-06-22 DIAGNOSIS — Z01419 Encounter for gynecological examination (general) (routine) without abnormal findings: Secondary | ICD-10-CM | POA: Diagnosis not present

## 2020-06-22 DIAGNOSIS — G43909 Migraine, unspecified, not intractable, without status migrainosus: Secondary | ICD-10-CM | POA: Insufficient documentation

## 2020-06-22 DIAGNOSIS — N939 Abnormal uterine and vaginal bleeding, unspecified: Secondary | ICD-10-CM | POA: Diagnosis not present

## 2020-06-22 DIAGNOSIS — J45909 Unspecified asthma, uncomplicated: Secondary | ICD-10-CM | POA: Insufficient documentation

## 2020-06-22 DIAGNOSIS — R140 Abdominal distension (gaseous): Secondary | ICD-10-CM | POA: Diagnosis not present

## 2020-06-26 ENCOUNTER — Ambulatory Visit: Payer: BC Managed Care – PPO | Admitting: Emergency Medicine

## 2020-07-11 ENCOUNTER — Telehealth: Payer: Self-pay | Admitting: Emergency Medicine

## 2020-07-11 MED ORDER — BUDESONIDE-FORMOTEROL FUMARATE 80-4.5 MCG/ACT IN AERO: 2.0000 | INHALATION_SPRAY | Freq: Two times a day (BID) | RESPIRATORY_TRACT | 12 refills | Status: DC

## 2020-07-11 NOTE — Telephone Encounter (Signed)
Spoke with pt and informed her walk could be preformed in OV on 08/04/19 and if more scans were needed Dr. Delton Coombes would decide in same OV. Pt states understatement. Nothing further needed at this time.

## 2020-07-11 NOTE — Telephone Encounter (Signed)
I agree with going back on the symbicort to see how she responds >> 2 puffs bid, rinse and gargle after  I believe she needs to be seen to discuss these sx and to perform walking oximetry. She may need repeat imaging or PFT as well. Please set her up for OV

## 2020-07-11 NOTE — Telephone Encounter (Signed)
Dr. Delton Coombes, please see mychart message sent by pt and advise:  To: [facility]    From: Beacher May    Created: 07/10/2020 11:00 AM     *-*-*This message was handled on 07/10/2020 4:37 PM by CAULFIELD, ASHLEY L*-*-*  Hi there.  I'm reaching out because I had been having some issues with breathing lately, especially with walking up stairs and activity so I bought a pulse oximeter for home to see what my oxygen levels were doing.  Well, I measured it while sitting down, after walking around the kitchen, and then after walking up stairs.  While sitting down it was measuring between 92-94, after walking upstairs it measured 84, and when walking around the kitchen it measured 91.  I have been taking the sirolimus but never took the  symbicort.  Will taking that help with those levels?  I constantly have chest pressure when I walk around now too.  Do I just need to add the sybicort back in and maintain my pills?     We have already resent the Rx for Symbicort to the pharmacy for pt.

## 2020-07-24 ENCOUNTER — Telehealth: Payer: Self-pay | Admitting: Emergency Medicine

## 2020-07-24 DIAGNOSIS — J8481 Lymphangioleiomyomatosis: Secondary | ICD-10-CM

## 2020-07-24 NOTE — Telephone Encounter (Addendum)
Spoke with the pt  She c/o cough with clear sputum, chest tightness and increased SOB since 07/23/20  She denies f/c/s, aches  Has had her covid vax x 2- last vax in May 2021  She states that she wants to know what to take for cough with will not interfere with her rapamune  She states still on her symbicort and has albuterol prn   Per RB- really needs to have some blood work done (cmet and cbc, orders placed)- very important to see what is going on with her. She can use delsym OTC for coug- will not interfere with other meds. Needs ov if not improving.   Spoke with pt and notified of this and she verbalized understanding. She states that she already has appt next wk and will keep this. I urged her to come asap for labs since these are overdue and she agreed to do so.

## 2020-07-25 ENCOUNTER — Other Ambulatory Visit (INDEPENDENT_AMBULATORY_CARE_PROVIDER_SITE_OTHER): Payer: BC Managed Care – PPO

## 2020-07-25 DIAGNOSIS — J8481 Lymphangioleiomyomatosis: Secondary | ICD-10-CM

## 2020-07-25 LAB — COMPREHENSIVE METABOLIC PANEL
ALT: 13 U/L (ref 0–35)
AST: 10 U/L (ref 0–37)
Albumin: 4.1 g/dL (ref 3.5–5.2)
Alkaline Phosphatase: 59 U/L (ref 39–117)
BUN: 13 mg/dL (ref 6–23)
CO2: 26 mEq/L (ref 19–32)
Calcium: 9.1 mg/dL (ref 8.4–10.5)
Chloride: 105 mEq/L (ref 96–112)
Creatinine, Ser: 0.76 mg/dL (ref 0.40–1.20)
GFR: 97.52 mL/min (ref >60.00)
Glucose, Bld: 148 mg/dL — ABNORMAL HIGH (ref 70–99)
Potassium: 3.7 mEq/L (ref 3.5–5.1)
Sodium: 139 mEq/L (ref 135–145)
Total Bilirubin: 0.4 mg/dL (ref 0.2–1.2)
Total Protein: 7.2 g/dL (ref 6.0–8.3)

## 2020-07-25 LAB — CBC WITH DIFFERENTIAL/PLATELET
Basophils Absolute: 0 10*3/uL (ref 0.0–0.1)
Basophils Relative: 0.8 % (ref 0.0–3.0)
Eosinophils Absolute: 0.1 10*3/uL (ref 0.0–0.7)
Eosinophils Relative: 1.3 % (ref 0.0–5.0)
HCT: 41.4 % (ref 36.0–46.0)
Hemoglobin: 13.6 g/dL (ref 12.0–15.0)
Lymphocytes Relative: 35.1 % (ref 12.0–46.0)
Lymphs Abs: 2 10*3/uL (ref 0.7–4.0)
MCHC: 32.9 g/dL (ref 30.0–36.0)
MCV: 86.9 fl (ref 78.0–100.0)
Monocytes Absolute: 0.2 10*3/uL (ref 0.1–1.0)
Monocytes Relative: 3.5 % (ref 3.0–12.0)
Neutro Abs: 3.3 10*3/uL (ref 1.4–7.7)
Neutrophils Relative %: 59.3 % (ref 43.0–77.0)
Platelets: 315 10*3/uL (ref 150.0–400.0)
RBC: 4.76 Mil/uL (ref 3.87–5.11)
RDW: 15.3 % (ref 11.5–15.5)
WBC: 5.6 10*3/uL (ref 4.0–10.5)

## 2020-07-31 ENCOUNTER — Telehealth: Payer: Self-pay | Admitting: Emergency Medicine
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
## 2020-08-01 ENCOUNTER — Encounter: Payer: Self-pay | Admitting: Gastroenterology

## 2020-08-03 ENCOUNTER — Ambulatory Visit (INDEPENDENT_AMBULATORY_CARE_PROVIDER_SITE_OTHER): Payer: Managed Care, Other (non HMO) | Admitting: Emergency Medicine

## 2020-08-03 ENCOUNTER — Other Ambulatory Visit: Payer: Self-pay

## 2020-08-03 ENCOUNTER — Encounter: Payer: Self-pay | Admitting: Emergency Medicine

## 2020-08-03 ENCOUNTER — Ambulatory Visit: Payer: BC Managed Care – PPO | Admitting: Emergency Medicine

## 2020-08-03 VITALS — BP 122/80 | HR 86 | Temp 97.5°F | Ht 67.0 in | Wt 243.6 lb

## 2020-08-03 DIAGNOSIS — J4520 Mild intermittent asthma, uncomplicated: Secondary | ICD-10-CM

## 2020-08-03 DIAGNOSIS — J8481 Lymphangioleiomyomatosis: Secondary | ICD-10-CM

## 2020-08-03 DIAGNOSIS — R0600 Dyspnea, unspecified: Secondary | ICD-10-CM

## 2020-08-03 DIAGNOSIS — R0609 Other forms of dyspnea: Secondary | ICD-10-CM

## 2020-08-03 DIAGNOSIS — G4733 Obstructive sleep apnea (adult) (pediatric): Secondary | ICD-10-CM

## 2020-08-03 NOTE — Assessment & Plan Note (Signed)
Back on sirolimus since November.  I will check VEGF and sirolimus level.  Contemplated getting her CT chest and PFT now but I think they will be more useful if we can get them a while she is on therapy.  I had planned initially to do these in July.  We will follow-up in 3 months and decide whether to do them early.  I may need to redose, uptitrate her sirolimus (her levels are usually low), depending on her CT and PFT.

## 2020-08-03 NOTE — Assessment & Plan Note (Signed)
Progressive exertional dyspnea.  Suspect that this is due to marginal compliance with most of our therapies.  She is been off her Symbicort, all of her sirolimus for large chunks of time.  Has not yet obtained her CPAP to treat sleep apnea.  The believe we need to work on treating all of these issues more effectively, follow her dyspnea.  She is also deconditioned and would probably benefit from pulmonary rehab.  She is willing to be referred

## 2020-08-03 NOTE — Assessment & Plan Note (Signed)
She is trying to restart Symbicort and I encouraged her to do so.  Keep albuterol as needed

## 2020-08-03 NOTE — Patient Instructions (Addendum)
We will check a sirolimus level, VEGF level Will need to repeat your CT scan of the chest in July 2022 Will need to repeat your pulmonary function testing in July 2022 We will speak with [employer] to see if we can expedite your CPAP delivery and teaching Continue your Symbicort 2 puffs twice a day. Rinse and gargle after using.  Keep albuterol available to use 2 puffs up to every 4 hours if needed for shortness of breath, chest tightness, wheezing.  We can refer you to Pulmonary Rehab to help build your respiratory conditioning.  Follow with Dr Lamonte Sakai in 3 months or sooner if you have any problems.

## 2020-08-03 NOTE — Progress Notes (Signed)
Subjective:    Patient ID: Brenda Williams, female    DOB: 02/18/1981, 40 y.o.   MRN: [id number]  HPI  ROV 03/08/20 --Brenda Williams follows up today.  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  Also with associated obstructive lung disease not currently on bronchodilators.  We have been treating her with sirolimus and she reports that she is been off this medication since.  She has undergone surveillance studies, repeat CT scan, repeat PFT.  She also had a sleep study as below  Pulmonary function testing 01/18/2020 reviewed by me, shows moderate severe obstruction with an FEV1 1.87 L (66% predicted), no bronchodilator response, normal lung volumes with decreased diffusion capacity that corrects to the normal range when adjusted for alveolar volume.  Her FEV1 11/13/2017 was 2.26 L (79), in 09/17/2016 was 2.14 L (74). Split-night sleep study reviewed by me 02/11/2020 showed moderate OSA with an AHI of 16.7/h predominantly in supine position.  CPAP was titrated to 10 cmH2O for optimal control. A CT scan of the chest done on 01/30/2020 reviewed by me, shows diffuse cystic lung disease similar to her previous CT.  A 9 mm right middle lobe nodules unchanged compared with 06/03/2017.  She reports that her breathing has been more problematic - more difficult to do her daily chores, walking a distance or up stairs. She can tolerate slow pace.  She stopped the sirolimus in early 2020 do to fear from [place]. No real wheeze or cough. She does not use albuterol - very rare.  She has a lot of daytime fatigue, would nap if she cough. ? Whether she snores.   CPAP therapy on 10 cm H2O with a Small size [employer] Full Face Mask Simplus mask and heated humidification   ROV 08/03/20 --40 year old woman with biopsy-proven LAM with associated polycystic lung disease and obstructive lung disease.  She also has obstructive sleep apnea, but does not have a CPAP machine - we ordered  it but a shortage through [facility] [employer] apparently.   She has been managed on sirolimus 1 mg twice daily since November 2021, was off for over a year.   She was going to restart her Symbicort last summer, but only did so about 3 weeks ago. Unsure whether it is helping her breathing, but she has noticed some hoarse voice with it. She uses albuterol about every 2 weeks.   Her last CT chest was 01/30/2020 as above.  She reports that she has been having more dyspnea for last few months, associated with some chest discomfort.  She bought a pulse oximeter and identified exertional hypoxemia in December  Labs reviewed from 07/25/2020 significant for normal transaminases and liver function, normal renal function, normal CBC.  Review of Systems As per HPI     Objective:   Physical Exam Vitals:   08/03/20 1542  BP: 122/80  Pulse: 86  Temp: (!) 97.5 F (36.4 C)  SpO2: 96%  Weight: 243 lb 9.6 oz (110.5 kg)  Height: 5\' 7"  (1.702 m)   Body mass index is 38.15 kg/m.  Gen: Pleasant, obese woman, in no distress,  normal affect  ENT: No lesions,  mouth clear,  oropharynx clear, no postnasal drip  Neck: No JVD, no stridor  Lungs: No use of accessory muscles, distant, clear without rales or rhonchi  Cardiovascular: RRR, heart sounds normal, no murmur or gallops, no peripheral edema  Musculoskeletal: No deformities, no cyanosis or clubbing  Neuro: alert, non focal  Skin: Warm,  no lesions or rashes      Assessment & Plan:    Dyspnea on exertion Progressive exertional dyspnea.  Suspect that this is due to marginal compliance with most of our therapies.  She is been off her Symbicort, all of her sirolimus for large chunks of time.  Has not yet obtained her CPAP to treat sleep apnea.  The believe we need to work on treating all of these issues more effectively, follow her dyspnea.  She is also deconditioned and would probably benefit from pulmonary rehab.  She is willing to be  referred  Lymphangioleiomyomatosis (Brenda Williams) Back on sirolimus since November.  I will check VEGF and sirolimus level.  Contemplated getting her CT chest and PFT now but I think they will be more useful if we can get them a while she is on therapy.  I had planned initially to do these in July.  We will follow-up in 3 months and decide whether to do them early.  I may need to redose, uptitrate her sirolimus (her levels are usually low), depending on her CT and PFT.  Mild intermittent asthma She is trying to restart Symbicort and I encouraged her to do so.  Keep albuterol as needed  Obstructive sleep apnea Not clear to me why she has not gotten her CPAP from [place].  I think on at least a couple occasions she deferred it.  Most recently they have had a shortage and could not get her a supply.  Baltazar Apo, MD, PhD 08/03/2020, 4:57 PM [facility] [phone] or if no answer [phone]

## 2020-08-03 NOTE — Assessment & Plan Note (Signed)
Not clear to me why she has not gotten her CPAP from [place].  I think on at least a couple occasions she deferred it.  Most recently they have had a shortage and could not get her a supply.

## 2020-08-05 LAB — SIROLIMUS LEVEL: Sirolimus (Rapamycin): 13.5 mcg/L (ref 3.0–18.0)

## 2020-08-07 ENCOUNTER — Encounter (HOSPITAL_COMMUNITY): Payer: Self-pay | Admitting: *Deleted

## 2020-08-07 LAB — VEGF, SERUM: VEGF, Serum: 462 pg/mL (ref 62–707)

## 2020-08-07 NOTE — Progress Notes (Signed)
Received referral from Dr. Lamonte Sakai for this pt to participate in pulmonary rehab with the the diagnosis of Lymphangioleiomyomatosis (LAM). Clinical review of pt follow up appt on 08/03/20 Pulmonary office note.  Pt with Covid Risk Score - 3. Pt appropriate for scheduling for Pulmonary rehab.  Will forward to support staff for scheduling and verification of insurance eligibility/benefits with pt consent. Cherre Huger, BSN Cardiac and Training and development officer

## 2020-08-08 ENCOUNTER — Telehealth: Payer: Self-pay | Admitting: Emergency Medicine

## 2020-08-08 NOTE — Telephone Encounter (Signed)
Called and spoke with pt and she stated that she spoke with [facility] today and they stated that they still do not have any cpap machines for her.  She is wanting to buy a cpap online and she will let us know if she ends up doing that.

## 2020-08-09 ENCOUNTER — Telehealth: Payer: Self-pay | Admitting: Emergency Medicine

## 2020-08-09 DIAGNOSIS — G4733 Obstructive sleep apnea (adult) (pediatric): Secondary | ICD-10-CM

## 2020-08-09 NOTE — Telephone Encounter (Signed)
New order placed

## 2020-08-09 NOTE — Telephone Encounter (Signed)
We need a new order the one in the system is from 02/2020

## 2020-08-13 ENCOUNTER — Telehealth (HOSPITAL_COMMUNITY): Payer: Self-pay

## 2020-08-13 NOTE — Telephone Encounter (Signed)
Pt insurance is active and benefits verified through [employer] Co-pay 0, DED $1,500/0 met, out of pocket $5,000/$25 met, co-insurance 30% after ded.. no pre-authorization required. Passport, [place]/[employer] 08/13/2020@8 :26am, REF# [id number]

## 2020-08-13 NOTE — Telephone Encounter (Signed)
Called patient to see if she is interested in the Pulmonary Rehab Program. Patient expressed interest. Explained scheduling process and went over insurance, patient verbalized understanding. Will contact patient for scheduling at a later time.

## 2020-08-20 ENCOUNTER — Encounter: Payer: Self-pay | Admitting: Gastroenterology

## 2020-08-20 ENCOUNTER — Ambulatory Visit: Payer: Managed Care, Other (non HMO) | Admitting: Gastroenterology

## 2020-08-20 VITALS — BP 132/90 | HR 86 | Ht 67.0 in | Wt 246.0 lb

## 2020-08-20 DIAGNOSIS — R140 Abdominal distension (gaseous): Secondary | ICD-10-CM | POA: Diagnosis not present

## 2020-08-20 DIAGNOSIS — K921 Melena: Secondary | ICD-10-CM | POA: Diagnosis not present

## 2020-08-20 DIAGNOSIS — R112 Nausea with vomiting, unspecified: Secondary | ICD-10-CM

## 2020-08-20 DIAGNOSIS — K219 Gastro-esophageal reflux disease without esophagitis: Secondary | ICD-10-CM | POA: Diagnosis not present

## 2020-08-20 DIAGNOSIS — R195 Other fecal abnormalities: Secondary | ICD-10-CM

## 2020-08-20 MED ORDER — CLENPIQ 10-3.5-12 MG-GM -GM/160ML PO SOLN: 1.0000 | Freq: Once | ORAL | 0 refills | Status: AC

## 2020-08-20 NOTE — Patient Instructions (Signed)
If you are age 42 or older, your body mass index should be between 23-30. Your Body mass index is 38.53 kg/m. If this is out of the aforementioned range listed, please consider follow up with your Primary Care Provider.  If you are age 48 or younger, your body mass index should be between 19-25. Your Body mass index is 38.53 kg/m. If this is out of the aformentioned range listed, please consider follow up with your Primary Care Provider.   Please purchase the following medications over the counter and take as directed: Probiotic Fiber Supplement  You have been scheduled for an endoscopy and colonoscopy. Please follow the written instructions given to you at your visit today. Please pick up your prep supplies at the pharmacy within the next 1-3 days. If you use inhalers (even only as needed), please bring them with you on the day of your procedure.  We have sent the following medications to your pharmacy for you to pick up at your convenience: Clenpiq  It was a pleasure to see you today!  Vito Cirigliano, D.O.

## 2020-08-20 NOTE — Progress Notes (Signed)
Chief Complaint: Hematochezia, change in bowel habits, nausea, heartburn   Referring Provider:     Shelda Pal, DO   HPI:     Brenda Williams is a 40 y.o. female with a history of lymphangioleiomyomatosis with associated polycystic lung disease (follows Pulmonary Clinic, on sirolimus), intermittent asthma, OSA (on CPAP), depression, anxiety, PCOS, prediabetes, liposuction 09/2019 with tummy tuck 11/2019, referred to the Gastroenterology Clinic for evaluation of hematochezia and change in bowel habits.  She reports a prior hx of hemorrhoids and fissures, s/p sphincterotomy ~2008 for fissures. Now with increasing sxs of hematoachezia, with BRB on tissue paper. No dyschezia. Otherwise, formed stools and no straining to have BM, but will have pellet like stools about 10 mins after BM.   Separately, she reports intemrittent abdominal bloating. Can occur independent of PO intake. Sxs for the last 2-3 months. Had restarted Sirolimus, but thinks sxs were already present at that time.   Separately, she does c/o intermittent nausea without vomiting, regurgitation, and metallic taste in the mouth. Recently started Protonix 40 mg/day. No change in bloating, but nausea resolved and regurgitation/HB improved. Still with metallic taste in AM. No dysphagia.   No prior EGD or colonoscopy.  No known family history of CRC, GI malignancy, liver disease, pancreatic disease, or IBD.   CBC Latest Ref Rng & Units 07/25/2020 03/08/2020 08/09/2019  WBC 4.0 - 10.5 K/uL 5.6 5.3 7.7  Hemoglobin 12.0 - 15.0 g/dL 13.6 13.5 14.0  Hematocrit 36.0 - 46.0 % 41.4 41.8 43.1  Platelets 150.0 - 400.0 K/uL 315.0 315.0 313   CMP Latest Ref Rng & Units 07/25/2020 03/08/2020 08/09/2019  Glucose 70 - 99 mg/dL 148(H) - 105(H)  BUN 6 - 23 mg/dL 13 - 10  Creatinine 0.40 - 1.20 mg/dL 0.76 - 0.61  Sodium 135 - 145 mEq/L 139 - 136  Potassium 3.5 - 5.1 mEq/L 3.7 - 4.0  Chloride 96 - 112 mEq/L 105 - 105  CO2 19  - 32 mEq/L 26 - 24  Calcium 8.4 - 10.5 mg/dL 9.1 - 9.7  Total Protein 6.0 - 8.3 g/dL 7.2 7.4 -  Total Bilirubin 0.2 - 1.2 mg/dL 0.4 0.4 -  Alkaline Phos 39 - 117 U/L 59 51 -  AST 0 - 37 U/L 10 11 -  ALT 0 - 35 U/L 13 11 -      Past Medical History:  Diagnosis Date  . Abnormal Pap smear 2006   leep  . Anxiety   . Arthritis    osteoarthritis  . Complication of anesthesia    2014 d+c [facility], HAD ??  SEIZURE/ SHAKING  . Cough   . Depression   . Fatigue   . GERD (gastroesophageal reflux disease)   . Herpes    never had outbreak. pos per blood, doesn't know which type  . Lactose intolerance   . Lymphangiomatosis   . Polycystic ovarian syndrome   . Prediabetes   . SVD (spontaneous vaginal delivery)    x 1  . Varicose vein of leg   . Vitamin D deficiency      Past Surgical History:  Procedure Laterality Date  . DILATION AND EVACUATION N/A 09/14/2012   Procedure: DILATATION AND EVACUATION;  Surgeon: Allena Katz, MD;  Location: [facility];  Service: Gynecology;  Laterality: N/A;  . HYSTEROSCOPY  2009   uterine polyp  . IVF Retrival  2010, 2011  . KNEE SURGERY  1998   right knee  . LEEP  2006  . LIPOSUCTION  2021  . LUNG BIOPSY Right 12/28/2015   Procedure: RIGHT LUNG BIOPSY;  Surgeon: Ivin Poot, MD;  Location: [facility];  Service: Thoracic;  Laterality: Right;  . SPHINCTEROTOMY  2010  . tummy tuck  11/12/2019  . uterine polyp removal    . VIDEO ASSISTED THORACOSCOPY Right 12/28/2015   Procedure: RIGHT VIDEO ASSISTED THORACOSCOPY;  Surgeon: Ivin Poot, MD;  Location: [facility];  Service: Thoracic;  Laterality: Right;   Family History  Problem Relation Age of Onset  . Diabetes Maternal Grandmother   . Heart disease Maternal Grandmother   . Hypertension Maternal Grandfather   . Cancer Maternal Grandfather 70       bone, colon   . Diabetes Maternal Grandfather   . Diabetes Mother   . Cancer Mother 58       breast cancer  . Hyperlipidemia Mother   .  Depression Mother   . Obesity Mother   . Hypertension Father   . Hyperlipidemia Father   . Thyroid disease Father   . Alcohol abuse Father   . Drug abuse Father   . Obesity Father   . Anesthesia problems Neg Hx    Social History   Tobacco Use  . Smoking status: Never Smoker  . Smokeless tobacco: Never Used  Vaping Use  . Vaping Use: Never used  Substance Use Topics  . Alcohol use: Not Currently    Alcohol/week: 0.0 standard drinks    Comment: OCC  . Drug use: No   Current Outpatient Medications  Medication Sig Dispense Refill  . albuterol (VENTOLIN HFA) 108 (90 Base) MCG/ACT inhaler Inhale 2 puffs into the lungs every 4 (four) hours as needed for wheezing or shortness of breath. 18 g 0  . budesonide-formoterol (SYMBICORT) 80-4.5 MCG/ACT inhaler Inhale 2 puffs into the lungs in the morning and at bedtime. 10.2 g 12  . pantoprazole (PROTONIX) 40 MG tablet Take 1 tablet (40 mg total) by mouth daily. 30 tablet 1  . sirolimus (RAPAMUNE) 1 MG tablet Take 1 tablet (1 mg total) by mouth 2 (two) times daily. 180 tablet 1   No current facility-administered medications for this visit.   Allergies  Allergen Reactions  . Fentanyl Itching    Benadryl effective for itching.     Review of Systems: All systems reviewed and negative except where noted in HPI.     Physical Exam:    Wt Readings from Last 3 Encounters:  08/20/20 246 lb (111.6 kg)  08/03/20 243 lb 9.6 oz (110.5 kg)  06/13/20 245 lb 6 oz (111.3 kg)    BP 132/90 (BP Location: Left Arm, Patient Position: Sitting, Cuff Size: Normal)   Pulse 86   Ht 5\' 7"  (1.702 m)   Wt 246 lb (111.6 kg)   BMI 38.53 kg/m  Constitutional:  Pleasant, in no acute distress. Psychiatric: Normal mood and affect. Behavior is normal. EENT: Pupils normal.  Conjunctivae are normal. No scleral icterus. Neck supple. No cervical LAD. Cardiovascular: Normal rate, regular rhythm. No edema Pulmonary/chest: Effort normal and breath sounds normal.  No wheezing, rales or rhonchi. Abdominal: Soft, nondistended, nontender. Bowel sounds active throughout. There are no masses palpable. No hepatomegaly. Neurological: Alert and oriented to person place and time. Skin: Skin is warm and dry. No rashes noted. Rectal: Exam deferred by patient to time of colonoscopy.    ASSESSMENT AND PLAN;   1) Hematochezia 2) Change in bowel  habits 3) History of perianal fissures s/p sphincterotomy -Colonoscopy to evaluate for etiology, to include hemorrhoidal disease, internal fissures, etc. -If clinically significant internal hemorrhoids, discussed plan for hemorrhoid banding -Start fiber supplement -Avoid prolonged time on toilet  4) Abdominal bloating -Evaluate for mucosal/luminal pathology at time of EGD/colonoscopy -Start probiotic -Evaluate for clinical improvement with fiber supplement as above  5) Nausea without vomiting 6) Regurgitation -Suspected GERD -EGD to evaluate for erosive esophagitis, LES laxity, hiatal hernia, etc. -Resume Protonix that was recently started -Discussed antireflux lifestyle/dietary modifications  The indications, risks, and benefits of EGD and colonoscopy were explained to the patient in detail. Risks include but are not limited to bleeding, perforation, adverse reaction to medications, and cardiopulmonary compromise. Sequelae include but are not limited to the possibility of surgery, hospitalization, and mortality. The patient verbalized understanding and wished to proceed. All questions answered, referred to scheduler and bowel prep ordered. Further recommendations pending results of the exam.    Lavena Bullion, DO, FACG  08/20/2020, 9:49 AM   Nani Ravens, [place] [facility]*

## 2020-09-13 ENCOUNTER — Encounter: Payer: Self-pay | Admitting: Gastroenterology

## 2020-09-18 ENCOUNTER — Other Ambulatory Visit: Payer: Self-pay | Admitting: Gastroenterology

## 2020-09-18 MED ORDER — CLENPIQ 10-3.5-12 MG-GM -GM/160ML PO SOLN: 1.0000 | Freq: Once | ORAL | 0 refills | Status: AC

## 2020-09-21 ENCOUNTER — Ambulatory Visit (AMBULATORY_SURGERY_CENTER): Payer: Managed Care, Other (non HMO) | Admitting: Gastroenterology

## 2020-09-21 ENCOUNTER — Other Ambulatory Visit: Payer: Self-pay | Admitting: Gastroenterology

## 2020-09-21 ENCOUNTER — Other Ambulatory Visit: Payer: Self-pay

## 2020-09-21 ENCOUNTER — Encounter: Payer: Self-pay | Admitting: Gastroenterology

## 2020-09-21 VITALS — BP 120/79 | HR 74 | Temp 97.3°F | Resp 18 | Ht 67.0 in | Wt 246.0 lb

## 2020-09-21 DIAGNOSIS — K921 Melena: Secondary | ICD-10-CM | POA: Diagnosis present

## 2020-09-21 DIAGNOSIS — K219 Gastro-esophageal reflux disease without esophagitis: Secondary | ICD-10-CM

## 2020-09-21 DIAGNOSIS — R110 Nausea: Secondary | ICD-10-CM | POA: Diagnosis not present

## 2020-09-21 DIAGNOSIS — R140 Abdominal distension (gaseous): Secondary | ICD-10-CM

## 2020-09-21 DIAGNOSIS — R12 Heartburn: Secondary | ICD-10-CM | POA: Diagnosis not present

## 2020-09-21 DIAGNOSIS — K640 First degree hemorrhoids: Secondary | ICD-10-CM

## 2020-09-21 MED ORDER — SODIUM CHLORIDE 0.9 % IV SOLN: 500.0000 mL | Freq: Once | INTRAVENOUS | Status: DC

## 2020-09-21 NOTE — Progress Notes (Addendum)
Pt's husband waited in the car.  I called him when his wife arrived in the recovery room, he was not in the parking lot, but left and went to work.  I explained he was not supposed to leave and he said he "had to step away".  He said he was on his way back to [facility] and I asked he come up to 4th floor when he arrives, so we will know when to discharge his wife. Dr. Bryan Lemma went over the findings with the pt.   I went over the discharge instructions with the pt.Maw  No problems noted in the recovery room. maw

## 2020-09-21 NOTE — Op Note (Signed)
[facility] Patient Name: Brenda Williams Procedure Date: 09/21/2020 1:52 PM MRN: [id number] Endoscopist: Gerrit Heck , MD Age: 40 Referring MD:  Date of Birth: 08/28/1980 Gender: Female Account #: [id number] Procedure:                Colonoscopy Indications:              Hematochezia. History of anal fissure s/p                            fissurotomy. Medicines:                Monitored Anesthesia Care Procedure:                Pre-Anesthesia Assessment:                           - Prior to the procedure, a History and Physical                            was performed, and patient medications and                            allergies were reviewed. The patient's tolerance of                            previous anesthesia was also reviewed. The risks                            and benefits of the procedure and the sedation                            options and risks were discussed with the patient.                            All questions were answered, and informed consent                            was obtained. Prior Anticoagulants: The patient has                            taken no previous anticoagulant or antiplatelet                            agents. ASA Grade Assessment: II - A patient with                            mild systemic disease. After reviewing the risks                            and benefits, the patient was deemed in                            satisfactory condition to undergo the procedure.  After obtaining informed consent, the colonoscope                            was passed under direct vision. Throughout the                            procedure, the patient's blood pressure, pulse, and                            oxygen saturations were monitored continuously. The                            Colonoscope was introduced through the anus and                            advanced to the the terminal ileum. The colonoscopy                             was performed without difficulty. The patient                            tolerated the procedure well. The quality of the                            bowel preparation was good. The terminal ileum,                            ileocecal valve, appendiceal orifice, and rectum                            were photographed. Scope In: 2:06:53 PM Scope Out: 2:18:28 PM Scope Withdrawal Time: 0 hours 9 minutes 7 seconds  Total Procedure Duration: 0 hours 11 minutes 35 seconds  Findings:                 The perianal and digital rectal examinations were                            normal.                           The colon appeared normal throughout.                           Non-bleeding internal hemorrhoids were found during                            retroflexion. The hemorrhoids were small.                           The terminal ileum appeared normal. Complications:            No immediate complications. Estimated Blood Loss:     Estimated blood loss: none. Impression:               - The entire examined colon is normal.                           -  Non-bleeding internal hemorrhoids.                           - The examined portion of the ileum was normal.                           - No specimens collected. Recommendation:           - Patient has a contact number available for                            emergencies. The signs and symptoms of potential                            delayed complications were discussed with the                            patient. Return to normal activities tomorrow.                            Written discharge instructions were provided to the                            patient.                           - Resume previous diet.                           - Continue present medications.                           - Repeat colonoscopy in 10 years for screening                            purposes.                           - Return to GI office  PRN.                           - Use fiber, for example Citrucel, Fibercon, Konsyl                            or Metamucil.                           - Internal hemorrhoids were noted on this study and                            may be amenable to hemorrhoid band ligation. If you                            are interested in further treatment of these                            hemorrhoids  with band ligation, please contact my                            clinic to set up an appointment for evaluation and                            treatment. Gerrit Heck, MD 09/21/2020 2:27:04 PM

## 2020-09-21 NOTE — Patient Instructions (Addendum)
Handouts were given to your care partner on hemorrhoids and hemorrhoid banding.  If you are interested in further treatment of these hemorrhoids with banding ligation.  Please call the office to set up an appointment to discuss if interested. [phone] Add over the counter fiber supplement, example Fibercon, Konsyl, Metamucil and Citrucel. You may resume your current medications today. Await biopsy results.  May take 1-3 weeks to receive pathology results. Repeat next screening colonoscopy in 10 years. Please call if any questions or concerns.     YOU HAD AN ENDOSCOPIC PROCEDURE TODAY AT [facility]:   Refer to the procedure report that was given to you for any specific questions about what was found during the examination.  If the procedure report does not answer your questions, please call your gastroenterologist to clarify.  If you requested that your care partner not be given the details of your procedure findings, then the procedure report has been included in a sealed envelope for you to review at your convenience later.  YOU SHOULD EXPECT: Some feelings of bloating in the abdomen. Passage of more gas than usual.  Walking can help get rid of the air that was put into your GI tract during the procedure and reduce the bloating. If you had a lower endoscopy (such as a colonoscopy or flexible sigmoidoscopy) you may notice spotting of blood in your stool or on the toilet paper. If you underwent a bowel prep for your procedure, you may not have a normal bowel movement for a few days.  Please Note:  You might notice some irritation and congestion in your nose or some drainage.  This is from the oxygen used during your procedure.  There is no need for concern and it should clear up in a day or so.  SYMPTOMS TO REPORT IMMEDIATELY:   Following lower endoscopy (colonoscopy or flexible sigmoidoscopy):  Excessive amounts of blood in the stool  Significant tenderness or worsening of  abdominal pains  Swelling of the abdomen that is new, acute  Fever of 100F or higher   Following upper endoscopy (EGD)  Vomiting of blood or coffee ground material  New chest pain or pain under the shoulder blades  Painful or persistently difficult swallowing  New shortness of breath  Fever of 100F or higher  Black, tarry-looking stools  For urgent or emergent issues, a gastroenterologist can be reached at any hour by calling [phone]. Do not use MyChart messaging for urgent concerns.    DIET:  We do recommend a small meal at first, but then you may proceed to your regular diet.  Drink plenty of fluids but you should avoid alcoholic beverages for 24 hours.  ACTIVITY:  You should plan to take it easy for the rest of today and you should NOT DRIVE or use heavy machinery until tomorrow (because of the sedation medicines used during the test).    FOLLOW UP: Our staff will call the number listed on your records 48-72 hours following your procedure to check on you and address any questions or concerns that you may have regarding the information given to you following your procedure. If we do not reach you, we will leave a message.  We will attempt to reach you two times.  During this call, we will ask if you have developed any symptoms of COVID 19. If you develop any symptoms (ie: fever, flu-like symptoms, shortness of breath, cough etc.) before then, please call [phone].  If you test positive for  Covid 19 in the 2 weeks post procedure, please call and report this information to [place].    If any biopsies were taken you will be contacted by phone or by letter within the next 1-3 weeks.  Please call us at [phone] if you have not heard about the biopsies in 3 weeks.    SIGNATURES/CONFIDENTIALITY: You and/or your care partner have signed paperwork which will be entered into your electronic medical record.  These signatures attest to the fact that that the information above on your  After Visit Summary has been reviewed and is understood.  Full responsibility of the confidentiality of this discharge information lies with you and/or your care-partner.

## 2020-09-21 NOTE — Op Note (Signed)
[facility] Patient Name: Brenda Williams Procedure Date: 09/21/2020 1:53 PM MRN: [id number] Endoscopist: Gerrit Heck , MD Age: 40 Referring MD:  Date of Birth: 1981-06-14 Gender: Female Account #: [id number] Procedure:                Upper GI endoscopy Indications:              Heartburn, Suspected esophageal reflux, Abdominal                            bloating, Nausea, Regurgitation Medicines:                Monitored Anesthesia Care Procedure:                Pre-Anesthesia Assessment:                           - Prior to the procedure, a History and Physical                            was performed, and patient medications and                            allergies were reviewed. The patient's tolerance of                            previous anesthesia was also reviewed. The risks                            and benefits of the procedure and the sedation                            options and risks were discussed with the patient.                            All questions were answered, and informed consent                            was obtained. Prior Anticoagulants: The patient has                            taken no previous anticoagulant or antiplatelet                            agents. ASA Grade Assessment: II - A patient with                            mild systemic disease. After reviewing the risks                            and benefits, the patient was deemed in                            satisfactory condition to undergo the procedure.  After obtaining informed consent, the endoscope was                            passed under direct vision. Throughout the                            procedure, the patient's blood pressure, pulse, and                            oxygen saturations were monitored continuously. The                            Endoscope was introduced through the mouth, and                            advanced to the second  part of duodenum. The upper                            GI endoscopy was accomplished without difficulty.                            The patient tolerated the procedure well. Scope In: Scope Out: Findings:                 The examined esophagus was normal.                           The Z-line was regular and was found 40 cm from the                            incisors.                           The entire examined stomach was normal. Biopsies                            were taken with a cold forceps for Helicobacter                            pylori testing. Estimated blood loss was minimal.                           The examined duodenum was normal. Biopsies for                            histology were taken with a cold forceps for                            evaluation of celiac disease. Estimated blood loss                            was minimal. Complications:            No immediate complications. Estimated Blood Loss:     Estimated blood loss: none. Estimated blood loss  was minimal. Impression:               - Normal esophagus.                           - Z-line regular, 40 cm from the incisors.                           - Normal stomach. Biopsied.                           - Normal examined duodenum. Biopsied. Recommendation:           - Patient has a contact number available for                            emergencies. The signs and symptoms of potential                            delayed complications were discussed with the                            patient. Return to normal activities tomorrow.                            Written discharge instructions were provided to the                            patient.                           - Resume previous diet.                           - Continue present medications.                           - Await pathology results. Gerrit Heck, MD 09/21/2020 2:24:31 PM

## 2020-09-21 NOTE — Progress Notes (Signed)
Called to room to assist during endoscopic procedure.  Patient ID and intended procedure confirmed with present staff. Received instructions for my participation in the procedure from the performing physician.  

## 2020-09-21 NOTE — Progress Notes (Signed)
Report to PACU, RN, vss, BBS= Clear.  

## 2020-09-25 ENCOUNTER — Other Ambulatory Visit: Payer: Self-pay

## 2020-09-25 ENCOUNTER — Encounter: Payer: Self-pay | Admitting: Emergency Medicine

## 2020-09-25 ENCOUNTER — Ambulatory Visit
Admission: EM | Admit: 2020-09-25 | Discharge: 2020-09-25 | Disposition: A | Discharge status: Home / Self Care | Payer: Managed Care, Other (non HMO) | Attending: Emergency Medicine | Admitting: Emergency Medicine

## 2020-09-25 DIAGNOSIS — J069 Acute upper respiratory infection, unspecified: Secondary | ICD-10-CM | POA: Diagnosis present

## 2020-09-25 LAB — POCT RAPID STREP A (OFFICE): Rapid Strep A Screen: NEGATIVE

## 2020-09-25 MED ORDER — BENZONATATE 200 MG PO CAPS: 200.0000 mg | ORAL_CAPSULE | Freq: Three times a day (TID) | ORAL | 0 refills | Status: AC | PRN

## 2020-09-25 MED ORDER — DM-GUAIFENESIN ER 30-600 MG PO TB12: 1.0000 | ORAL_TABLET | Freq: Two times a day (BID) | ORAL | 0 refills | Status: DC

## 2020-09-25 MED ORDER — FLUTICASONE PROPIONATE 50 MCG/ACT NA SUSP: 1.0000 | Freq: Every day | NASAL | 0 refills | Status: DC

## 2020-09-25 MED ORDER — AMOXICILLIN-POT CLAVULANATE 875-125 MG PO TABS: 1.0000 | ORAL_TABLET | Freq: Two times a day (BID) | ORAL | 0 refills | Status: AC

## 2020-09-25 MED ORDER — IBUPROFEN 600 MG PO TABS: 600.0000 mg | ORAL_TABLET | Freq: Four times a day (QID) | ORAL | 0 refills | Status: DC | PRN

## 2020-09-25 NOTE — ED Provider Notes (Signed)
[facility]    CSN: [id number] Arrival date & time: 09/25/20  0825      History   Chief Complaint Chief Complaint  Patient presents with  . Sore Throat  . Nasal Congestion    HPI Brenda Williams is a 40 y.o. female presenting today for evaluation of URI symptoms.  Reports that she has had sore throat cough and congestion.  Had EGD performed on 3/11 on day 1 of symptoms.  Denies any known fevers.  Reports scratchy sensation in throat with associated pain with swallowing.  Has felt hoarse.  Took a rapid at home Covid test which was negative.  Using warm liquids without relief.  HPI  Past Medical History:  Diagnosis Date  . Abnormal Pap smear 2006   leep  . Anxiety   . Arthritis    osteoarthritis  . Asthma   . Complication of anesthesia    2014 d[facility]c [facility], HAD ??  SEIZURE/ SHAKING  . Cough   . Depression   . Fatigue   . GERD (gastroesophageal reflux disease)   . Herpes    never had outbreak. pos per blood, doesn't know which type  . Lactose intolerance   . Lymphangiomatosis   . Polycystic ovarian syndrome   . Prediabetes   . Sleep apnea    uses CPAP  . SVD (spontaneous vaginal delivery)    x 1  . Varicose vein of leg   . Vitamin D deficiency     Patient Active Problem List   Diagnosis Date Noted  . Dyspnea on exertion 08/03/2020  . Obstructive sleep apnea 03/08/2020  . MDD (major depressive disorder), recurrent episode, severe ([place]) 11/18/2017  . Mild intermittent asthma 06/12/2017  . Depression 03/17/2017  . Vitamin D deficiency 03/17/2017  . Class 2 obesity with serious comorbidity and body mass index (BMI) of 35.0 to 35.9 in adult 03/17/2017  . Prediabetes 02/23/2017  . 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 Obesity 01/12/2015  . Chronic fatigue 12/01/2014  . Pulmonary nodules 06/28/2013  . Cervix abnormality 05/12/2011  . Polycystic ovaries 05/12/2011  . Female infertility of unspecified origin 05/12/2011    Past Surgical  History:  Procedure Laterality Date  . DILATION AND EVACUATION N/A 09/14/2012   Procedure: DILATATION AND EVACUATION;  Surgeon: Allena Katz, MD;  Location: [facility];  Service: Gynecology;  Laterality: N/A;  . HYSTEROSCOPY  2009   uterine polyp  . IVF Retrival  2010, 2011  . KNEE SURGERY  1998   right knee  . LEEP  2006  . LIPOSUCTION  2021  . LUNG BIOPSY Right 12/28/2015   Procedure: RIGHT LUNG BIOPSY;  Surgeon: Ivin Poot, MD;  Location: [facility];  Service: Thoracic;  Laterality: Right;  . SPHINCTEROTOMY  2010  . tummy tuck  11/12/2019  . uterine polyp removal    . VIDEO ASSISTED THORACOSCOPY Right 12/28/2015   Procedure: RIGHT VIDEO ASSISTED THORACOSCOPY;  Surgeon: Ivin Poot, MD;  Location[facility];  Service: Thoracic;  Laterality: Right;    OB History    Gravida  5   Para  1   Term  1   Preterm  0   AB  2   Living  1     SAB  1   IAB  0   Ectopic  1   Multiple  0   Live Births  1            Home Medications    Prior  to Admission medications   Medication Sig Start Date End Date Taking? Authorizing Provider  amoxicillin-clavulanate (AUGMENTIN) 875-125 MG tablet Take 1 tablet by mouth every 12 (twelve) hours for 7 days. 09/28/20 10/05/20 Yes Canna Nickelson C, PA-C  benzonatate (TESSALON) 200 MG capsule Take 1 capsule (200 mg total) by mouth 3 (three) times daily as needed for up to 7 days for cough. 09/25/20 10/02/20 Yes Haywood Meinders C, PA-C  dextromethorphan-guaiFENesin (MUCINEX DM) 30-600 MG 12hr tablet Take 1 tablet by mouth 2 (two) times daily. 09/25/20  Yes Kristyanna Barcelo C, PA-C  fluticasone (FLONASE) 50 MCG/ACT nasal spray Place 1-2 sprays into both nostrils daily. 09/25/20  Yes Kyron Schlitt C, PA-C  ibuprofen (ADVIL) 600 MG tablet Take 1 tablet (600 mg total) by mouth every 6 (six) hours as needed. 09/25/20  Yes Jennesis Ramaswamy C, PA-C  pantoprazole (PROTONIX) 40 MG tablet Take 1 tablet (40 mg total) by mouth daily. 06/13/20  Yes Shelda Pal, DO  sirolimus (RAPAMUNE) 1 MG tablet Take 1 tablet (1 mg total) by mouth 2 (two) times daily. 03/24/20  Yes Collene Gobble, MD  albuterol (VENTOLIN HFA) 108 (90 Base) MCG/ACT inhaler Inhale 2 puffs into the lungs every 4 (four) hours as needed for wheezing or shortness of breath. 09/30/19   Collene Gobble, MD  budesonide-formoterol (SYMBICORT) 80-4.5 MCG/ACT inhaler Inhale 2 puffs into the lungs in the morning and at bedtime. 07/11/20   Collene Gobble, MD  phentermine (ADIPEX-P) 37.5 MG tablet Take 1 tablet (37.5 mg total) by mouth daily before breakfast. 01/08/18 06/02/19  Shelda Pal, DO    Family History Family History  Problem Relation Age of Onset  . Diabetes Maternal Grandmother   . Heart disease Maternal Grandmother   . Hypertension Maternal Grandfather   . Cancer Maternal Grandfather 70       bone, colon   . Diabetes Maternal Grandfather   . Colon cancer Maternal Grandfather   . Diabetes Mother   . Cancer Mother 73       breast cancer  . Hyperlipidemia Mother   . Depression Mother   . Obesity Mother   . Hypertension Father   . Hyperlipidemia Father   . Thyroid disease Father   . Alcohol abuse Father   . Drug abuse Father   . Obesity Father   . Anesthesia problems Neg Hx   . Colon polyps Neg Hx   . Esophageal cancer Neg Hx   . Stomach cancer Neg Hx     Social History Social History   Tobacco Use  . Smoking status: Never Smoker  . Smokeless tobacco: Never Used  Vaping Use  . Vaping Use: Never used  Substance Use Topics  . Alcohol use: Not Currently    Alcohol/week: 0.0 standard drinks  . Drug use: No     Allergies   Fentanyl   Review of Systems Review of Systems  Constitutional: Negative for activity change, appetite change, chills, fatigue and fever.  HENT: Positive for congestion, rhinorrhea, sinus pressure and sore throat. Negative for ear pain and trouble swallowing.   Eyes: Negative for discharge and redness.   Respiratory: Positive for cough. Negative for chest tightness and shortness of breath.   Cardiovascular: Negative for chest pain.  Gastrointestinal: Negative for abdominal pain, diarrhea, nausea and vomiting.  Musculoskeletal: Negative for myalgias.  Skin: Negative for rash.  Neurological: Negative for dizziness, light-headedness and headaches.     Physical Exam Triage Vital Signs ED Triage Vitals  Enc Vitals Group     BP      Pulse      Resp      Temp      Temp src      SpO2      Weight      Height      Head Circumference      Peak Flow      Pain Score      Pain Loc      Pain Edu?      Excl. in [place]?    No data found.  Updated Vital Signs BP 133/88 (BP Location: Left Arm)   Pulse 69   Temp 97.9 F (36.6 C) (Oral)   Resp 18   Ht 5\' 7"  (1.702 m)   Wt 240 lb (108.9 kg)   LMP 09/23/2020   SpO2 94%   BMI 37.59 kg/m   Visual Acuity Right Eye Distance:   Left Eye Distance:   Bilateral Distance:    Right Eye Near:   Left Eye Near:    Bilateral Near:     Physical Exam Vitals and nursing note reviewed.  Constitutional:      Appearance: She is well-developed.     Comments: No acute distress  HENT:     Head: Normocephalic and atraumatic.     Ears:     Comments: Bilateral ears without tenderness to palpation of external auricle, tragus and mastoid, EAC's without erythema or swelling, TM's with good bony landmarks and cone of light. Non erythematous.     Nose: Nose normal.     Mouth/Throat:     Comments: Oral mucosa pink and moist, no tonsillar enlargement or exudate. Posterior pharynx patent and nonerythematous, no uvula deviation or swelling. Normal phonation. Eyes:     Conjunctiva/sclera: Conjunctivae normal.  Cardiovascular:     Rate and Rhythm: Normal rate.  Pulmonary:     Effort: Pulmonary effort is normal. No respiratory distress.     Comments: Breathing comfortably at rest, CTABL, no wheezing, rales or other adventitious sounds auscultated Abdominal:      General: There is no distension.  Musculoskeletal:        General: Normal range of motion.     Cervical back: Neck supple.  Skin:    General: Skin is warm and dry.  Neurological:     Mental Status: She is alert and oriented to person, place, and time.      UC Treatments / Results  Labs (all labs ordered are listed, but only abnormal results are displayed) Labs Reviewed  POCT RAPID STREP A (OFFICE)    EKG   Radiology No results found.  Procedures Procedures (including critical care time)  Medications Ordered in UC Medications - No data to display  Initial Impression / Assessment and Plan / UC Course  I have reviewed the triage vital signs and the nursing notes.  Pertinent labs & imaging results that were available during my care of the patient were reviewed by me and considered in my medical decision making (see chart for details).     Viral URI with cough-exam reassuring, lungs clear to auscultation, suspect viral etiology, possible some throat irritation stemming from recent EGD.  Recommending continued symptomatic and supportive care.  Day 5 of symptoms, did provide prescription of Augmentin to fill in 3 to 4 days if still not having improvement with continued supportive care.  Discussed strict return precautions. Patient verbalized understanding and is agreeable with plan.  Final Clinical Impressions(s) /  UC Diagnoses   Final diagnoses:  Viral URI with cough     Discharge Instructions     Continue to rest and fluids Tylenol and ibuprofen for throat pain May restart daily Zyrtec, Flonase nasal spray 1 to 2 spray in each nostril daily Mucinex DM twice daily for cough/congestion Tessalon for cough Or may use other over-the-counter medicines if preferred Rest and fluids Follow-up if not improving or worsening If still not seeing any improvement over the next 3 to 4 days may fill prescription for Augmentin to cover sinus infection on Friday    ED  Prescriptions    Medication Sig Dispense Auth. Provider   benzonatate (TESSALON) 200 MG capsule Take 1 capsule (200 mg total) by mouth 3 (three) times daily as needed for up to 7 days for cough. 28 capsule Reona Zendejas C, PA-C   ibuprofen (ADVIL) 600 MG tablet Take 1 tablet (600 mg total) by mouth every 6 (six) hours as needed. 30 tablet Rakisha Pincock C, PA-C   fluticasone (FLONASE) 50 MCG/ACT nasal spray Place 1-2 sprays into both nostrils daily. 16 g Jaylean Buenaventura C, PA-C   dextromethorphan-guaiFENesin (MUCINEX DM) 30-600 MG 12hr tablet Take 1 tablet by mouth 2 (two) times daily. 20 tablet Erez Mccallum C, PA-C   amoxicillin-clavulanate (AUGMENTIN) 875-125 MG tablet Take 1 tablet by mouth every 12 (twelve) hours for 7 days. 14 tablet Neco Kling, [place] C, PA-C     PDMP not reviewed this encounter.   Janith Lima, [place] 09/25/20 [phone]

## 2020-09-25 NOTE — ED Triage Notes (Signed)
Patient c/o sore throat and nasal congestion x 5 days.   Patient denies any fever at home.   Patient states "my throat feels scratchy and I have some pain with swallowing".   Patient endorses hoarseness.   Patient has an endoscopic procedure done on Friday upon onset of symptoms.   Patient endorses preforming a rapid COVID test at home with negative test results on Sunday.   Patient has used warm liquids for symptoms with no relief.

## 2020-09-25 NOTE — Discharge Instructions (Signed)
Continue to rest and fluids Tylenol and ibuprofen for throat pain May restart daily Zyrtec, Flonase nasal spray 1 to 2 spray in each nostril daily Mucinex DM twice daily for cough/congestion Tessalon for cough Or may use other over-the-counter medicines if preferred Rest and fluids Follow-up if not improving or worsening If still not seeing any improvement over the next 3 to 4 days may fill prescription for Augmentin to cover sinus infection on Friday

## 2020-09-26 ENCOUNTER — Telehealth: Payer: Self-pay

## 2020-09-26 LAB — NOVEL CORONAVIRUS, NAA: SARS-CoV-2, NAA: NOT DETECTED

## 2020-09-26 LAB — SARS-COV-2, NAA 2 DAY TAT

## 2020-09-26 NOTE — Telephone Encounter (Signed)
LVM

## 2020-09-27 LAB — CULTURE, GROUP A STREP (THRC)

## 2020-10-01 ENCOUNTER — Encounter: Payer: Self-pay | Admitting: Gastroenterology

## 2020-10-11 ENCOUNTER — Telehealth (HOSPITAL_COMMUNITY): Payer: Self-pay

## 2020-10-11 NOTE — Telephone Encounter (Signed)
Called pt to confirm pulmonary orientation tomorrow morning at 0900. Pt confirmed she will be here. Gave her instructions of where we are located. Also instructed pt to wear mask and closed toe/heel shoes. Pt verbalized understanding.

## 2020-10-12 ENCOUNTER — Encounter (HOSPITAL_COMMUNITY): Payer: Self-pay

## 2020-10-12 ENCOUNTER — Other Ambulatory Visit: Payer: Self-pay

## 2020-10-12 ENCOUNTER — Encounter (HOSPITAL_COMMUNITY)
Admission: RE | Admit: 2020-10-12 | Discharge: 2020-10-12 | Disposition: A | Discharge status: Home / Self Care | Payer: Managed Care, Other (non HMO) | Source: Ambulatory Visit | Attending: Emergency Medicine | Admitting: Emergency Medicine

## 2020-10-12 VITALS — BP 130/82 | HR 79 | Ht 67.5 in | Wt 240.5 lb

## 2020-10-12 DIAGNOSIS — J8481 Lymphangioleiomyomatosis: Secondary | ICD-10-CM | POA: Diagnosis not present

## 2020-10-12 NOTE — Progress Notes (Signed)
[name] 40 y.o. female Pulmonary Rehab Physical Assessment Note  Physical assessment reveals heart rate is normal, breath sounds clear to auscultation, no wheezes, rales, or rhonchi but diminished throughout. Active bowel sounds x 4 quadrants.  No complaints of nausea, vomiting, diarrhea or poor appetite. Grip strength equal, strong. Distal pulses palpable.  Trace swelling noted bilateral. Brenda Williams is looking forward to participating in pulmonary rehab. Cherre Huger, BSN Cardiac and Training and development officer

## 2020-10-12 NOTE — Progress Notes (Signed)
Pulmonary Individual Treatment Plan  Patient Details  Name: Brenda Williams MRN: [id number] Date of Birth: 1980/10/17 Referring Provider:   April Manson Pulmonary Rehab Walk Test from 10/12/2020 in [place]  Referring Provider Dr. 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  albuterol (VENTOLIN HFA) 108 (90 Base) MCG/ACT inhaler, Inhale 2 puffs into the lungs every 4 (four) hours as needed for wheezing or shortness of breath., Disp: 18 g, Rfl: 0 .  budesonide-formoterol (SYMBICORT) 80-4.5 MCG/ACT inhaler, Inhale 2 puffs into the lungs in the morning and at bedtime., Disp: 10.2 g, Rfl: 12 .  dextromethorphan-guaiFENesin (MUCINEX DM) 30-600 MG 12hr tablet, Take 1 tablet by mouth 2 (two) times daily. (Patient not taking: Reported on 10/12/2020), Disp: 20 tablet, Rfl: 0 .  fluticasone (FLONASE) 50 MCG/ACT nasal spray, Place 1-2 sprays into both nostrils daily., Disp: 16 g, Rfl: 0 .  ibuprofen (ADVIL) 600 MG tablet, Take 1 tablet (600 mg total) by mouth every 6 (six) hours as needed., Disp: 30 tablet, Rfl: 0 .  pantoprazole (PROTONIX) 40 MG tablet, Take 1 tablet (40 mg total) by mouth daily., Disp: 30 tablet, Rfl: 1 .  sirolimus (RAPAMUNE) 1 MG tablet, Take 1 tablet (1 mg total) by mouth 2 (two) times daily., Disp: 180 tablet, Rfl: 1  Past Medical History: Past Medical History:  Diagnosis Date  . Abnormal Pap smear 2006   leep  . Anxiety   . Arthritis    osteoarthritis  . Asthma   . Complication of anesthesia    2014 d+c [facility], HAD ??  SEIZURE/ SHAKING  . Cough   . Depression   . Fatigue   . GERD (gastroesophageal reflux disease)   . Herpes    never had outbreak. pos per blood, doesn't know which type   . Lactose intolerance   . Lymphangiomatosis   . Polycystic ovarian syndrome   . Prediabetes   . Sleep apnea    uses CPAP  . SVD (spontaneous vaginal delivery)    x 1  . Varicose vein of leg   . Vitamin D deficiency     Tobacco Use: Social History   Tobacco Use  Smoking Status Never Smoker  Smokeless Tobacco Never Used    Labs: Recent Review Flowsheet Data    Labs for ITP Cardiac and Pulmonary Rehab Latest Ref Rng & Units 09/18/2015 12/26/2015 12/29/2015 01/28/2017 06/22/2019   Cholestrol 100 - 199 mg/dL 223(H) - - 233(H) -   LDLCALC 0 - 99 mg/dL 154(H) - - 160(H) -   LDLDIRECT mg/dL - - - - -   HDL >39 mg/dL 57.00 - - 53 -   Trlycerides 0 - 149 mg/dL 63.0 - - 99 -   Hemoglobin A1c 4.6 - 6.5 % 6.0 - - 6.1(H) 6.1   PHART 7.350 - 7.450 - 7.426 7.361 - -   PCO2ART 35.0 - 45.0 mmHg - 38.0 45.5(H) - -   HCO3 20.0 - 24.0 mEq/L - 24.5(H) 25.8(H) - -   TCO2 0 - 100 mmol/L - 25.7 27 - -   O2SAT % - 97.0 96.0 - -      Capillary Blood Glucose: Lab Results  Component Value Date   GLUCAP 106 (H) 11/18/2017  GLUCAP 116 (H) 12/31/2015   GLUCAP 84 12/30/2015   GLUCAP 124 (H) 12/30/2015   GLUCAP 136 (H) 12/29/2015     Pulmonary Assessment Scores:  Pulmonary Assessment Scores    Row Name 10/12/20 1218         ADL UCSD   ADL Phase Entry     SOB Score total 44           CAT Score   CAT Score 18           mMRC Score   mMRC Score 2           UCSD: Self-administered rating of dyspnea associated with activities of daily living (ADLs) 6-point scale (0 = "not at all" to 5 = "maximal or unable to do because of breathlessness")  Scoring Scores range from 0 to 120.  Minimally important difference is 5 units  CAT: CAT can identify the health impairment of COPD patients and is better correlated with disease progression.  CAT has a scoring range of zero to 40. The CAT score is classified into four groups of low (less than 10), medium (10 - 20), high (21-30) and very high  (31-40) based on the impact Williams of disease on health status. A CAT score over 10 suggests significant symptoms.  A worsening CAT score could be explained by an exacerbation, poor medication adherence, poor inhaler technique, or progression of COPD or comorbid conditions.  CAT MCID is 2 points  mMRC: mMRC (Modified Medical Research Council) Dyspnea Scale is used to assess the degree of baseline functional disability in patients of respiratory disease due to dyspnea. No minimal important difference is established. A decrease in score of 1 point or greater is considered a positive change.   Pulmonary Function Assessment:  Pulmonary Function Assessment - 10/12/20 0936      Breath   Shortness of Breath Yes;Limiting activity           Exercise Target Goals: Exercise Program Goal: Individual exercise prescription set using results from initial 6 min walk test and THRR while considering  patient's activity barriers and safety.   Exercise Prescription Goal: Initial exercise prescription builds to 30-45 minutes a day of aerobic activity, 2-3 days per week.  Home exercise guidelines will be given to patient during program as part of exercise prescription that the participant will acknowledge.  Activity Barriers & Risk Stratification:  Activity Barriers & Cardiac Risk Stratification - 10/12/20 0934      Activity Barriers & Cardiac Risk Stratification   Activity Barriers Arthritis;Deconditioning;Shortness of Breath;Joint Problems           6 Minute Walk:  6 Minute Walk    Row Name 10/12/20 1207         6 Minute Walk   Phase Initial     Distance 1294 feet     Walk Time 6 minutes     # of Rest Breaks 0     MPH 2.45     METS 4.07     RPE 11     Perceived Dyspnea  2     VO2 Peak 14.26     Symptoms Yes (comment)     Comments pt complained of chest tightness but is aware this is due to lung issue     Resting HR 79 bpm     Resting BP 130/82     Resting Oxygen Saturation  94 %      Exercise Oxygen Saturation  during 6 min walk 87 %  Max Ex. HR 112 bpm     Max Ex. BP 136/90     2 Minute Post BP 132/86           Interval HR   1 Minute HR 98     2 Minute HR 101     3 Minute HR 96     4 Minute HR 105     5 Minute HR 112     6 Minute HR 107     2 Minute Post HR 79     Interval Heart Rate? Yes           Interval Oxygen   Interval Oxygen? Yes     Baseline Oxygen Saturation % 94 %     1 Minute Oxygen Saturation % 92 %     1 Minute Liters of Oxygen 0 L     2 Minute Oxygen Saturation % 89 %     2 Minute Liters of Oxygen 0 L     3 Minute Oxygen Saturation % 87 %     3 Minute Liters of Oxygen 0 L     4 Minute Oxygen Saturation % 87 %     4 Minute Liters of Oxygen 0 L     5 Minute Oxygen Saturation % 87 %     5 Minute Liters of Oxygen 0 L     6 Minute Oxygen Saturation % 87 %     6 Minute Liters of Oxygen 0 L     2 Minute Post Oxygen Saturation % 96 %     2 Minute Post Liters of Oxygen 0 L            Oxygen Initial Assessment:  Oxygen Initial Assessment - 10/12/20 0936      Home Oxygen   Home Oxygen Device None    Sleep Oxygen Prescription CPAP    Home Exercise Oxygen Prescription None    Home Resting Oxygen Prescription None    Compliance with Home Oxygen Use Yes      Initial 6 min Walk   Oxygen Used None      Program Oxygen Prescription   Program Oxygen Prescription None      Intervention   Short Term Goals To learn and understand importance of monitoring SPO2 with pulse oximeter and demonstrate accurate use of the pulse oximeter.;To learn and understand importance of maintaining oxygen saturations>88%;To learn and demonstrate proper pursed lip breathing techniques or other breathing techniques.;To learn and demonstrate proper use of respiratory medications    Long  Term Goals Verbalizes importance of monitoring SPO2 with pulse oximeter and return demonstration;Maintenance of O2 saturations>88%;Exhibits proper breathing techniques, such as  pursed lip breathing or other method taught during program session;Compliance with respiratory medication;Demonstrates proper use of MDI's           Oxygen Re-Evaluation:   Oxygen Discharge (Final Oxygen Re-Evaluation):   Initial Exercise Prescription:  Initial Exercise Prescription - 10/12/20 1200      Date of Initial Exercise RX and Referring Provider   Date 10/12/20    Referring Provider Dr. Lamonte Sakai    Expected Discharge Date 12/13/20      Treadmill   MPH 2.2    Grade 0    Minutes 15      NuStep   Williams 2    SPM 80    Minutes 15      Prescription Details   Frequency (times per week) 2    Duration Progress to 30 minutes  of continuous aerobic without signs/symptoms of physical distress      Intensity   THRR 40-80% of Max Heartrate 72-143    Ratings of Perceived Exertion 11-13    Perceived Dyspnea 0-4      Progression   Progression Continue to progress workloads to maintain intensity without signs/symptoms of physical distress.      Resistance Training   Training Prescription Yes    Weight Red bands    Reps 10-15           Perform Capillary Blood Glucose checks as needed.  Exercise Prescription Changes:   Exercise Comments:   Exercise Goals and Review:  Exercise Goals    Row Name 10/12/20 1204             Exercise Goals   Increase Physical Activity Yes       Intervention Provide advice, education, support and counseling about physical activity/exercise needs.;Develop an individualized exercise prescription for aerobic and resistive training based on initial evaluation findings, risk stratification, comorbidities and participant's personal goals.       Expected Outcomes Short Term: Attend rehab on a regular basis to increase amount of physical activity.;Long Term: Add in home exercise to make exercise part of routine and to increase amount of physical activity.;Long Term: Exercising regularly at least 3-5 days a week.       Increase Strength and  Stamina Yes       Intervention Provide advice, education, support and counseling about physical activity/exercise needs.;Develop an individualized exercise prescription for aerobic and resistive training based on initial evaluation findings, risk stratification, comorbidities and participant's personal goals.       Expected Outcomes Short Term: Increase workloads from initial exercise prescription for resistance, speed, and METs.;Short Term: Perform resistance training exercises routinely during rehab and add in resistance training at home;Long Term: Improve cardiorespiratory fitness, muscular endurance and strength as measured by increased METs and functional capacity (6MWT)       Able to understand and use rate of perceived exertion (RPE) scale Yes       Intervention Provide education and explanation on how to use RPE scale       Expected Outcomes Short Term: Able to use RPE daily in rehab to express subjective intensity Williams;Long Term:  Able to use RPE to guide intensity Williams when exercising independently       Able to understand and use Dyspnea scale Yes       Intervention Provide education and explanation on how to use Dyspnea scale       Expected Outcomes Short Term: Able to use Dyspnea scale daily in rehab to express subjective sense of shortness of breath during exertion;Long Term: Able to use Dyspnea scale to guide intensity Williams when exercising independently       Knowledge and understanding of Target Heart Rate Range (THRR) Yes       Intervention Provide education and explanation of THRR including how the numbers were predicted and where they are located for reference       Expected Outcomes Short Term: Able to state/look up THRR;Long Term: Able to use THRR to govern intensity when exercising independently;Short Term: Able to use daily as guideline for intensity in rehab       Understanding of Exercise Prescription Yes       Intervention Provide education, explanation, and written  materials on patient's individual exercise prescription       Expected Outcomes Short Term: Able to explain program exercise prescription;Long Term: Able  to explain home exercise prescription to exercise independently              Exercise Goals Re-Evaluation :   Discharge Exercise Prescription (Final Exercise Prescription Changes):   Nutrition:  Target Goals: Understanding of nutrition guidelines, daily intake of sodium 1500mg , cholesterol 200mg , calories 30% from fat and 7% or less from saturated fats, daily to have 5 or more servings of fruits and vegetables.  Biometrics:  Pre Biometrics - 10/12/20 1205      Pre Biometrics   Grip Strength 31 kg   Left Hand           Nutrition Therapy Plan and Nutrition Goals:   Nutrition Assessments:  MEDIFICTS Score Key:  ?70 Need to make dietary changes   40-70 Heart Healthy Diet  ? 40 Therapeutic Williams Cholesterol Diet   Picture Your Plate Scores:  <52 Unhealthy dietary pattern with much room for improvement.  41-50 Dietary pattern unlikely to meet recommendations for good health and room for improvement.  51-60 More healthful dietary pattern, with some room for improvement.   >60 Healthy dietary pattern, although there may be some specific behaviors that could be improved.    Nutrition Goals Re-Evaluation:   Nutrition Goals Discharge (Final Nutrition Goals Re-Evaluation):   Psychosocial: Target Goals: Acknowledge presence or absence of significant depression and/or stress, maximize coping skills, provide positive support system. Participant is able to verbalize types and ability to use techniques and skills needed for reducing stress and depression.  Initial Review & Psychosocial Screening:  Initial Psych Review & Screening - 10/12/20 0937      Initial Review   Current issues with History of Depression;Current Stress Concerns   Diagnosed with depression when she was diagnosed with lung disease   Source of  Stress Concerns Chronic Illness;Occupation    Comments Job is stressfull. And she is not able to to do things with her kids and that upsets her.      Family Dynamics   Good Support System? Yes   Friends, family, and Husband     Barriers   Psychosocial barriers to participate in program The patient should benefit from training in stress management and relaxation.      Screening Interventions   Interventions Encouraged to exercise    Expected Outcomes Long Term goal: The participant improves quality of Life and PHQ9 Scores as seen by post scores and/or verbalization of changes;Long Term Goal: Stressors or current issues are controlled or eliminated.           Quality of Life Scores:  Scores of 19 and below usually indicate a poorer quality of life in these areas.  A difference of  2-3 points is a clinically meaningful difference.  A difference of 2-3 points in the total score of the Quality of Life Index has been associated with significant improvement in overall quality of life, self-image, physical symptoms, and general health in studies assessing change in quality of life.  PHQ-9: Recent Review Flowsheet Data    Depression screen [facility] 2/9 10/12/2020 01/28/2017 04/28/2016 09/17/2015   Decreased Interest 0 3 0 0   Down, Depressed, Hopeless 0 3 0 0   PHQ - 2 Score 0 6 0 0   Altered sleeping 0 1 - -   Tired, decreased energy 2 - - -   Change in appetite 0 3 - -   Feeling bad or failure about yourself  0 2 - -   Trouble concentrating 0 2 - -  Moving slowly or fidgety/restless 0 2 - -   Suicidal thoughts 0 1 - -   PHQ-9 Score - 17 - -   Difficult doing work/chores Not difficult at all - - -     Interpretation of Total Score  Total Score Depression Severity:  1-4 = Minimal depression, 5-9 = Mild depression, 10-14 = Moderate depression, 15-19 = Moderately severe depression, 20-27 = Severe depression   Psychosocial Evaluation and Intervention:  Psychosocial Evaluation - 10/12/20 1039       Psychosocial Evaluation & Interventions   Expected Outcomes Decrease shortness of breath with daily activities and improve quality of life.           Psychosocial Re-Evaluation:   Psychosocial Discharge (Final Psychosocial Re-Evaluation):   Education: Education Goals: Education classes will be provided on a weekly basis, covering required topics. Participant will state understanding/return demonstration of topics presented.  Learning Barriers/Preferences:  Learning Barriers/Preferences - 10/12/20 0944      Learning Barriers/Preferences   Learning Barriers None    Learning Preferences Skilled Demonstration;Individual Instruction;Group Instruction           Education Topics: Risk Factor Reduction:  -Group instruction that is supported by a PowerPoint presentation. Instructor discusses the definition of a risk factor, different risk factors for pulmonary disease, and how the heart and lungs work together.     Nutrition for Pulmonary Patient:  -Group instruction provided by PowerPoint slides, verbal discussion, and written materials to support subject matter. The instructor gives an explanation and review of healthy diet recommendations, which includes a discussion on weight management, recommendations for fruit and vegetable consumption, as well as protein, fluid, caffeine, fiber, sodium, sugar, and alcohol. Tips for eating when patients are short of breath are discussed.   Pursed Lip Breathing:  -Group instruction that is supported by demonstration and informational handouts. Instructor discusses the benefits of pursed lip and diaphragmatic breathing and detailed demonstration on how to preform both.     Oxygen Safety:  -Group instruction provided by PowerPoint, verbal discussion, and written material to support subject matter. There is an overview of "What is Oxygen" and "Why do we need it".  Instructor also reviews how to create a safe environment for oxygen use, the  importance of using oxygen as prescribed, and the risks of noncompliance. There is a brief discussion on traveling with oxygen and resources the patient may utilize.   Oxygen Equipment:  -Group instruction provided by [facility] Staff utilizing handouts, written materials, and equipment demonstrations.   Signs and Symptoms:  -Group instruction provided by written material and verbal discussion to support subject matter. Warning signs and symptoms of infection, stroke, and heart attack are reviewed and when to call the physician/911 reinforced. Tips for preventing the spread of infection discussed.   Advanced Directives:  -Group instruction provided by verbal instruction and written material to support subject matter. Instructor reviews Advanced Directive laws and proper instruction for filling out document.   Pulmonary Video:  -Group video education that reviews the importance of medication and oxygen compliance, exercise, good nutrition, pulmonary hygiene, and pursed lip and diaphragmatic breathing for the pulmonary patient.   Exercise for the Pulmonary Patient:  -Group instruction that is supported by a PowerPoint presentation. Instructor discusses benefits of exercise, core components of exercise, frequency, duration, and intensity of an exercise routine, importance of utilizing pulse oximetry during exercise, safety while exercising, and options of places to exercise outside of rehab.     Pulmonary Medications:  -Verbally interactive group  education provided by instructor with focus on inhaled medications and proper administration.   Anatomy and Physiology of the Respiratory System and Intimacy:  -Group instruction provided by PowerPoint, verbal discussion, and written material to support subject matter. Instructor reviews respiratory cycle and anatomical components of the respiratory system and their functions. Instructor also reviews differences in obstructive and restrictive  respiratory diseases with examples of each. Intimacy, Sex, and Sexuality differences are reviewed with a discussion on how relationships can change when diagnosed with pulmonary disease. Common sexual concerns are reviewed.   MD DAY -A group question and answer session with a medical doctor that allows participants to ask questions that relate to their pulmonary disease state.   OTHER EDUCATION -Group or individual verbal, written, or video instructions that support the educational goals of the pulmonary rehab program.   Holiday Eating Survival Tips:  -Group instruction provided by PowerPoint slides, verbal discussion, and written materials to support subject matter. The instructor gives patients tips, tricks, and techniques to help them not only survive but enjoy the holidays despite the onslaught of food that accompanies the holidays.   Knowledge Questionnaire Score:  Knowledge Questionnaire Score - 10/12/20 1216      Knowledge Questionnaire Score   Pre Score 16/18           Core Components/Risk Factors/Patient Goals at Admission:  Personal Goals and Risk Factors at Admission - 10/12/20 0944      Core Components/Risk Factors/Patient Goals on Admission    Weight Management Yes;Obesity    Goal Weight: Short Term 200 lb (90.7 kg)    Goal Weight: Long Term 175 lb (79.4 kg)    Improve shortness of breath with ADL's Yes    Intervention Provide education, individualized exercise plan and daily activity instruction to help decrease symptoms of SOB with activities of daily living.    Expected Outcomes Short Term: Improve cardiorespiratory fitness to achieve a reduction of symptoms when performing ADLs;Long Term: Be able to perform more ADLs without symptoms or delay the onset of symptoms           Core Components/Risk Factors/Patient Goals Review:    Core Components/Risk Factors/Patient Goals at Discharge (Final Review):    ITP Comments:   Comments:

## 2020-10-12 NOTE — Progress Notes (Signed)
Brenda Williams 40 y.o. female Pulmonary Rehab Orientation Note This patient who was referred to Pulmonary rehab by Dr. Lamonte Sakai with the diagnosis of Lymphangioleiomyomatosis arrived today in Cardiac and Pulmonary Rehab. She arrived ambulatory with steady gait. She does not carry portable oxygen. Per pt, she uses oxygen at night when going to sleep. Color good, skin warm and dry. Patient is oriented to time and place. Patient's medical history, psychosocial health, and medications reviewed. Psychosocial assessment reveals pt lives with their spouse and children. Pt is currently working full time as a Chief of Staff. Pt hobbies include singing, painting, and reading. Pt reports her stress level is moderate. Areas of stress/anxiety include Health Work.  Pt does not exhibit signs of depression. PHQ2/9 score 0/2. Pt shows good coping skills with positive outlook. Brenda Williams was offered emotional support and reassurance. Will continue to monitor and evaluate progress toward psychosocial goal(s) of improving quality of life and decreasing stress. Patient reports she does take medications as prescribed. Patient states she follows a Regular diet. The patient reports wanting to lose weight but has not been doing anything special in order to do so. Pt is eager to meet with dietician to help with weight loss.. Patient's weight will be monitored closely. Demonstration and practice of PLB using pulse oximeter. Patient able to return demonstration satisfactorily. Safety and hand hygiene in the exercise area reviewed with patient. Patient voices understanding of the information reviewed. Department expectations discussed with patient and achievable goals were set. The patient shows enthusiasm about attending the program and we look forward to working with this nice lady. The patient completed a 6 min walk test today and is scheduled to begin exercise on 10/16/20.  45 minutes was spent on a variety of activities such as  assessment of the patient, obtaining baseline data including height, weight, BMI, and grip strength, verifying medical history, allergies, and current medications, and teaching patient strategies for performing tasks with less respiratory effort with emphasis on pursed lip breathing.  Rick Duff MS, ACSM CEP

## 2020-10-16 ENCOUNTER — Encounter (HOSPITAL_COMMUNITY)
Admission: RE | Admit: 2020-10-16 | Discharge: 2020-10-16 | Disposition: A | Discharge status: Home / Self Care | Payer: Managed Care, Other (non HMO) | Source: Ambulatory Visit | Attending: Emergency Medicine | Admitting: Emergency Medicine

## 2020-10-16 ENCOUNTER — Other Ambulatory Visit: Payer: Self-pay

## 2020-10-16 VITALS — Wt 240.5 lb

## 2020-10-16 DIAGNOSIS — J8481 Lymphangioleiomyomatosis: Secondary | ICD-10-CM

## 2020-10-16 NOTE — Progress Notes (Signed)
Daily Session Note  Patient Details  Name: Brenda Williams MRN: [id number] Date of Birth: [birth year] Referring Provider:   April Manson Pulmonary Rehab Walk Test from 10/12/2020 in [place]  Referring Provider Dr. Lamonte Sakai      Encounter Date: 10/16/2020  Check In:  Session Check In - 10/16/20 1433      Check-In   Supervising physician immediately available to respond to emergencies [facility] immediately available    Physician(s) Dr. Tawanna Solo    Location [facility]    Staff Present Maurice Small, RN, BSN;Ido Wollman Ysidro Evert, RN;Jessica Hassell Done, MS, ACSM-CEP, Exercise Physiologist    Virtual Visit No    Medication changes reported     No    Fall or balance concerns reported    No    Tobacco Cessation No Change    Warm-up and Cool-down Performed on first and last piece of equipment    Resistance Training Performed Yes    VAD Patient? No    PAD/SET Patient? No      Pain Assessment   Currently in Pain? No/denies    Pain Score 0-No pain    Multiple Pain Sites No           Capillary Blood Glucose: No results found for this or any previous visit (from the past 24 hour(s)).   Exercise Prescription Changes - 10/16/20 1500      Response to Exercise   Blood Pressure (Admit) 124/86    Blood Pressure (Exercise) 122/84    Blood Pressure (Exit) 110/80    Heart Rate (Admit) 91 bpm    Heart Rate (Exercise) 115 bpm    Heart Rate (Exit) 91 bpm    Oxygen Saturation (Admit) 95 %    Oxygen Saturation (Exercise) 89 %    Oxygen Saturation (Exit) 95 %    Rating of Perceived Exertion (Exercise) 12    Perceived Dyspnea (Exercise) 2    Duration Progress to 30 minutes of  aerobic without signs/symptoms of physical distress    Intensity Other (comment)   40-80% of HRR     Progression   Progression Continue to progress workloads to maintain intensity without signs/symptoms of physical distress.      Resistance Training   Training  Prescription Yes    Weight red band    Reps 10-15    Time 10 Minutes      Treadmill   MPH 2.2    Grade 0    Minutes 15      NuStep   Level 2    SPM 80    Minutes 1.8           Social History   Tobacco Use  Smoking Status Never Smoker  Smokeless Tobacco Never Used    Goals Met:  Exercise tolerated well No report of cardiac concerns or symptoms Strength training completed today  Goals Unmet:  Not Applicable  Comments: Service time is from 1400 to 1500    Dr. Fransico Him is Medical Director for Cardiac Rehab at [facility].

## 2020-10-18 ENCOUNTER — Encounter (HOSPITAL_COMMUNITY): Payer: Managed Care, Other (non HMO)

## 2020-10-23 ENCOUNTER — Encounter (HOSPITAL_COMMUNITY)
Admission: RE | Admit: 2020-10-23 | Discharge: 2020-10-23 | Disposition: A | Discharge status: Home / Self Care | Payer: Managed Care, Other (non HMO) | Source: Ambulatory Visit | Attending: Emergency Medicine | Admitting: Emergency Medicine

## 2020-10-23 ENCOUNTER — Other Ambulatory Visit: Payer: Self-pay

## 2020-10-23 DIAGNOSIS — J8481 Lymphangioleiomyomatosis: Secondary | ICD-10-CM

## 2020-10-23 NOTE — Progress Notes (Signed)
Daily Session Note  Patient Details  Name: Brenda Williams MRN: [id number] Date of Birth: 13-Feb-1981 Referring Provider:   April Manson Pulmonary Rehab Walk Test from 10/12/2020 in [place]  Referring Provider Dr. Lamonte Sakai      Encounter Date: 10/23/2020  Check In:  Session Check In - 10/23/20 1430      Check-In   Supervising physician immediately available to respond to emergencies [facility] immediately available    Physician(s) Dr. Tawanna Solo    [facility]    Staff Present Rosebud Poles, RN, Milus Glazier, MS, EP-C, CCRP;Jessica Hassell Done, MS, ACSM-CEP, Exercise Physiologist    Virtual Visit No    Medication changes reported     No    Fall or balance concerns reported    No    Tobacco Cessation No Change    Warm-up and Cool-down Performed on first and last piece of equipment    Resistance Training Performed Yes    VAD Patient? No    PAD/SET Patient? No      Pain Assessment   Currently in Pain? No/denies    Multiple Pain Sites No           Capillary Blood Glucose: No results found for this or any previous visit (from the past 24 hour(s)).    Social History   Tobacco Use  Smoking Status Never Smoker  Smokeless Tobacco Never Used    Goals Met:  Proper associated with RPD/PD & O2 Sat Exercise tolerated well Strength training completed today  Goals Unmet:  Not Applicable  Comments: Service time is from 1405  to [place]    Dr. Fransico Him is Medical Director for Cardiac Rehab at [facility].

## 2020-10-25 ENCOUNTER — Telehealth (HOSPITAL_COMMUNITY): Payer: Self-pay | Admitting: Family Medicine

## 2020-10-25 ENCOUNTER — Encounter (HOSPITAL_COMMUNITY): Payer: Managed Care, Other (non HMO)

## 2020-10-30 ENCOUNTER — Other Ambulatory Visit: Payer: Self-pay

## 2020-10-30 ENCOUNTER — Encounter (HOSPITAL_COMMUNITY)
Admission: RE | Admit: 2020-10-30 | Discharge: 2020-10-30 | Disposition: A | Discharge status: Home / Self Care | Payer: Managed Care, Other (non HMO) | Source: Ambulatory Visit | Attending: Emergency Medicine | Admitting: Emergency Medicine

## 2020-10-30 VITALS — Wt 241.4 lb

## 2020-10-30 DIAGNOSIS — J8481 Lymphangioleiomyomatosis: Secondary | ICD-10-CM

## 2020-10-30 NOTE — Progress Notes (Signed)
Daily Session Note  Patient Details  Name: SHANNIA JACUINDE MRN: [id number] Date of Birth: October 08, 1980 Referring Provider:   April Manson Pulmonary Rehab Walk Test from 10/12/2020 in [place]  Referring Provider Dr. Lamonte Sakai      Encounter Date: 10/30/2020  Check In:  Session Check In - 10/30/20 [place]      Check-In   Supervising physician immediately available to respond to emergencies [facility] immediately available    Physician(s) Dr. Tawanna Solo    [facility]    Staff Present Rosebud Poles, RN, BSN;Jenavee Laguardia Ysidro Evert, RN;Jessica Hassell Done, MS, ACSM-CEP, Exercise Physiologist    Virtual Visit No    Medication changes reported     No    Fall or balance concerns reported    No    Tobacco Cessation No Change    Warm-up and Cool-down Performed on first and last piece of equipment    Resistance Training Performed Yes    VAD Patient? No    PAD/SET Patient? No      Pain Assessment   Currently in Pain? No/denies    Multiple Pain Sites No           Capillary Blood Glucose: No results found for this or any previous visit (from the past 24 hour(s)).   Exercise Prescription Changes - 10/30/20 1500      Response to Exercise   Blood Pressure (Admit) 118/82    Blood Pressure (Exercise) 120/76    Blood Pressure (Exit) 122/88    Heart Rate (Admit) 68 bpm    Heart Rate (Exercise) 102 bpm    Heart Rate (Exit) 78 bpm    Oxygen Saturation (Admit) 98 %    Oxygen Saturation (Exercise) 89 %    Oxygen Saturation (Exit) 93 %    Rating of Perceived Exertion (Exercise) 11    Perceived Dyspnea (Exercise) 1    Duration Continue with 30 min of aerobic exercise without signs/symptoms of physical distress.    Intensity THRR unchanged      Progression   Progression Continue to progress workloads to maintain intensity without signs/symptoms of physical distress.      Resistance Training   Training Prescription Yes    Weight red bands     Reps 10-15    Time 10 Minutes      Treadmill   MPH 2.2    Grade 0    Minutes 15      NuStep   Level 2    SPM 80    Minutes 15    METs 2.2           Social History   Tobacco Use  Smoking Status Never Smoker  Smokeless Tobacco Never Used    Goals Met:  Exercise tolerated well No report of cardiac concerns or symptoms Strength training completed today  Goals Unmet:  Not Applicable  Comments:Service time is from 1400 to 1500      Dr. Fransico Him is Medical Director for Cardiac Rehab at [facility].

## 2020-10-31 ENCOUNTER — Emergency Department (HOSPITAL_BASED_OUTPATIENT_CLINIC_OR_DEPARTMENT_OTHER): Payer: Managed Care, Other (non HMO)

## 2020-10-31 ENCOUNTER — Other Ambulatory Visit: Payer: Self-pay

## 2020-10-31 ENCOUNTER — Encounter (HOSPITAL_BASED_OUTPATIENT_CLINIC_OR_DEPARTMENT_OTHER): Payer: Self-pay

## 2020-10-31 ENCOUNTER — Emergency Department (HOSPITAL_BASED_OUTPATIENT_CLINIC_OR_DEPARTMENT_OTHER)
Admission: EM | Admit: 2020-10-31 | Discharge: 2020-10-31 | Disposition: A | Discharge status: Home / Self Care | Payer: Managed Care, Other (non HMO) | Attending: Emergency Medicine | Admitting: Emergency Medicine

## 2020-10-31 DIAGNOSIS — Z7951 Long term (current) use of inhaled steroids: Secondary | ICD-10-CM | POA: Diagnosis not present

## 2020-10-31 DIAGNOSIS — R0789 Other chest pain: Secondary | ICD-10-CM | POA: Diagnosis present

## 2020-10-31 DIAGNOSIS — U071 COVID-19: Secondary | ICD-10-CM

## 2020-10-31 DIAGNOSIS — R079 Chest pain, unspecified: Secondary | ICD-10-CM

## 2020-10-31 DIAGNOSIS — J45909 Unspecified asthma, uncomplicated: Secondary | ICD-10-CM | POA: Insufficient documentation

## 2020-10-31 LAB — PREGNANCY, URINE: Preg Test, Ur: NEGATIVE

## 2020-10-31 LAB — RESP PANEL BY RT-PCR (FLU A&B, COVID) ARPGX2
Influenza A by PCR: NEGATIVE
Influenza B by PCR: NEGATIVE
SARS Coronavirus 2 by RT PCR: POSITIVE — AB

## 2020-10-31 LAB — CBC WITH DIFFERENTIAL/PLATELET
Abs Immature Granulocytes: 0.01 10*3/uL (ref 0.00–0.07)
Basophils Absolute: 0 10*3/uL (ref 0.0–0.1)
Basophils Relative: 1 %
Eosinophils Absolute: 0 10*3/uL (ref 0.0–0.5)
Eosinophils Relative: 1 %
HCT: 43.2 % (ref 36.0–46.0)
Hemoglobin: 14 g/dL (ref 12.0–15.0)
Immature Granulocytes: 0 %
Lymphocytes Relative: 10 %
Lymphs Abs: 0.4 10*3/uL — ABNORMAL LOW (ref 0.7–4.0)
MCH: 28.2 pg (ref 26.0–34.0)
MCHC: 32.4 g/dL (ref 30.0–36.0)
MCV: 87.1 fL (ref 80.0–100.0)
Monocytes Absolute: 0.3 10*3/uL (ref 0.1–1.0)
Monocytes Relative: 7 %
Neutro Abs: 3.5 10*3/uL (ref 1.7–7.7)
Neutrophils Relative %: 81 %
Platelets: 336 10*3/uL (ref 150–400)
RBC: 4.96 MIL/uL (ref 3.87–5.11)
RDW: 15.3 % (ref 11.5–15.5)
WBC: 4.3 10*3/uL (ref 4.0–10.5)
nRBC: 0 % (ref 0.0–0.2)

## 2020-10-31 LAB — COMPREHENSIVE METABOLIC PANEL
ALT: 21 U/L (ref 0–44)
AST: 19 U/L (ref 15–41)
Albumin: 4 g/dL (ref 3.5–5.0)
Alkaline Phosphatase: 52 U/L (ref 38–126)
Anion gap: 10 (ref 5–15)
BUN: 9 mg/dL (ref 6–20)
CO2: 22 mmol/L (ref 22–32)
Calcium: 9.4 mg/dL (ref 8.9–10.3)
Chloride: 104 mmol/L (ref 98–111)
Creatinine, Ser: 0.65 mg/dL (ref 0.44–1.00)
GFR, Estimated: >60 mL/min (ref >60)
Glucose, Bld: 114 mg/dL — ABNORMAL HIGH (ref 70–99)
Potassium: 3.6 mmol/L (ref 3.5–5.1)
Sodium: 136 mmol/L (ref 135–145)
Total Bilirubin: 0.6 mg/dL (ref 0.3–1.2)
Total Protein: 7.9 g/dL (ref 6.5–8.1)

## 2020-10-31 LAB — TROPONIN I (HIGH SENSITIVITY): Troponin I (High Sensitivity): 4 ng/L (ref <18)

## 2020-10-31 MED ORDER — ACETAMINOPHEN 325 MG PO TABS
650.0000 mg | ORAL_TABLET | ORAL | Status: DC | PRN
  Administered 2020-10-31: 650 mg via ORAL
  Filled 2020-10-31: qty 2

## 2020-10-31 NOTE — ED Triage Notes (Signed)
Reports chest tightness. History of chronic lung disease. Also reports headache, body aches, nausea, dry mouth, diarrhea, fatigue, insomnia, and cough.

## 2020-10-31 NOTE — Discharge Instructions (Signed)
Brenda Williams, It was a pleasure to meet you today. While in the ED, your COVID-19 test has resulted positive. This means you will need to isolate for 5 days from symptom onset (4/19-4/24). If you are fever free for 24 hours, you should be able to leave isolation on April 25th. Please let any close contacts know of your positive COVID-19 test, as they should quarantine for at least 5 days if they have not been fully vaccinated. If you have worsening shortness of breath or chest pain, please return to the Emergency Department.  Thank you, Sanjuan Dame, MD

## 2020-10-31 NOTE — ED Provider Notes (Signed)
[place] EMERGENCY DEPARTMENT Provider Note   CSN: [id number] Arrival date & time: 10/31/20  1324    History Chief Complaint  Patient presents with  . Chest Pain    Brenda Williams is a 40 y.o. female with lymphangioleiomyomatosis, sleep apnea, prediabetes, PCOS presenting to [facility] ED with myalgias, chest tightness, and shortness of breath. Patient reports she began having these symptoms yesterday. Describes chest tightness as mid-sternal, no radiation, worsens when she lays down flat. Patient also reports acute on chronic dyspnea that began around the same time. She is currently undergoing pulmonary rehabilitation for chronic dyspnea, but states these symptoms are similar to when she has had viral illnesses in the past. She mentions a chronic cough, but says this has actually improved over the last day or so. Ms. Brenda Williams also says she has developed some nausea and diarrhea overnight. Denies vomiting, abdominal pain, hematochezia. Also denies sick contacts. She does report getting Pzifer COVID-19 vaccine x2, no influenza vaccine.     Past Medical History:  Diagnosis Date  . Abnormal Pap smear 2006   leep  . Anxiety   . Arthritis    osteoarthritis  . Asthma   . Complication of anesthesia    2014 [facility]c [facility], HAD ??  SEIZURE/ SHAKING  . Cough   . Depression   . Fatigue   . GERD (gastroesophageal reflux disease)   . Herpes    never had outbreak. pos per blood, doesn't know which type  . Lactose intolerance   . Lymphangiomatosis   . Polycystic ovarian syndrome   . Prediabetes   . Sleep apnea    uses CPAP  . SVD (spontaneous vaginal delivery)    x 1  . Varicose vein of leg   . Vitamin D deficiency     Patient Active Problem List   Diagnosis Date Noted  . Dyspnea on exertion 08/03/2020  . Obstructive sleep apnea 03/08/2020  . MDD (major depressive disorder), recurrent episode, severe ([place]) 11/18/2017  . Mild intermittent asthma 06/12/2017  . Depression  03/17/2017  . Vitamin D deficiency 03/17/2017  . Class 2 obesity with serious comorbidity and body mass index (BMI) of 35.0 to 35.9 in adult 03/17/2017  . Prediabetes 02/23/2017  . Lymphangioleiomyomatosis ([place]) 12/28/2015  . Obesity 01/12/2015  . Chronic fatigue 12/01/2014  . Pulmonary nodules 06/28/2013  . Cervix abnormality 05/12/2011  . Polycystic ovaries 05/12/2011  . Female infertility of unspecified origin 05/12/2011    Past Surgical History:  Procedure Laterality Date  . DILATION AND EVACUATION N/A 09/14/2012   Procedure: DILATATION AND EVACUATION;  Surgeon: Allena Katz, MD;  Location: [facility];  Service: Gynecology;  Laterality: N/A;  . HYSTEROSCOPY  2009   uterine polyp  . IVF Retrival  2010, 2011  . KNEE SURGERY  1998   right knee  . LEEP  2006  . LIPOSUCTION  2021  . LUNG BIOPSY Right 12/28/2015   Procedure: RIGHT LUNG BIOPSY;  Surgeon: Ivin Poot, MD;  Location: [facility];  Service: Thoracic;  Laterality: Right;  . SPHINCTEROTOMY  2010  . tummy tuck  11/12/2019  . uterine polyp removal    . VIDEO ASSISTED THORACOSCOPY Right 12/28/2015   Procedure: RIGHT VIDEO ASSISTED THORACOSCOPY;  Surgeon: Ivin Poot, MD;  Location: [facility];  Service: Thoracic;  Laterality: Right;     OB History    Gravida  5   Para  1   Term  1   Preterm  0  AB  2   Living  1     SAB  1   IAB  0   Ectopic  1   Multiple  0   Live Births  1           Family History  Problem Relation Age of Onset  . Diabetes Maternal Grandmother   . Heart disease Maternal Grandmother   . Hypertension Maternal Grandfather   . Cancer Maternal Grandfather 70       bone, colon   . Diabetes Maternal Grandfather   . Colon cancer Maternal Grandfather   . Diabetes Mother   . Cancer Mother 38       breast cancer  . Hyperlipidemia Mother   . Depression Mother   . Obesity Mother   . Hypertension Father   . Hyperlipidemia Father   . Thyroid disease Father   . Alcohol abuse Father    . Drug abuse Father   . Obesity Father   . Anesthesia problems Neg Hx   . Colon polyps Neg Hx   . Esophageal cancer Neg Hx   . Stomach cancer Neg Hx     Social History   Tobacco Use  . Smoking status: Never Smoker  . Smokeless tobacco: Never Used  Vaping Use  . Vaping Use: Never used  Substance Use Topics  . Alcohol use: Not Currently    Alcohol/week: 0.0 standard drinks  . Drug use: No    Home Medications Prior to Admission medications   Medication Sig Start Date End Date Taking? Authorizing Provider  albuterol (VENTOLIN HFA) 108 (90 Base) MCG/ACT inhaler Inhale 2 puffs into the lungs every 4 (four) hours as needed for wheezing or shortness of breath. 09/30/19   Collene Gobble, MD  budesonide-formoterol (SYMBICORT) 80-4.5 MCG/ACT inhaler Inhale 2 puffs into the lungs in the morning and at bedtime. 07/11/20   Collene Gobble, MD  dextromethorphan-guaiFENesin (MUCINEX DM) 30-600 MG 12hr tablet Take 1 tablet by mouth 2 (two) times daily. Patient not taking: Reported on 10/12/2020 09/25/20   Wieters, Hallie C, PA-C  fluticasone (FLONASE) 50 MCG/ACT nasal spray Place 1-2 sprays into both nostrils daily. 09/25/20   Wieters, Hallie C, PA-C  ibuprofen (ADVIL) 600 MG tablet Take 1 tablet (600 mg total) by mouth every 6 (six) hours as needed. 09/25/20   Wieters, Hallie C, PA-C  pantoprazole (PROTONIX) 40 MG tablet Take 1 tablet (40 mg total) by mouth daily. 06/13/20   Shelda Pal, DO  sirolimus (RAPAMUNE) 1 MG tablet Take 1 tablet (1 mg total) by mouth 2 (two) times daily. 03/24/20   Collene Gobble, MD  phentermine (ADIPEX-P) 37.5 MG tablet Take 1 tablet (37.5 mg total) by mouth daily before breakfast. 01/08/18 06/02/19  Shelda Pal, DO    Allergies    Fentanyl  Review of Systems   Review of Systems  Constitutional: Negative for appetite change, chills and fever.  Respiratory: Positive for chest tightness and shortness of breath. Negative for cough.    Cardiovascular: Negative for palpitations.  Gastrointestinal: Positive for diarrhea and nausea. Negative for abdominal pain and vomiting.  Musculoskeletal: Positive for myalgias.    Physical Exam Updated Vital Signs BP (!) 153/101 (BP Location: Right Arm)   Pulse 92   Temp 99.4 F (37.4 C) (Oral)   Resp 16   Ht 5\' 7"  (1.702 m)   Wt 109.3 kg   LMP 10/31/2020   SpO2 96%   BMI 37.75 kg/m   Physical Exam  Constitutional:      General: She is awake. She is not in acute distress.    Appearance: She is well-developed. She is not toxic-appearing or diaphoretic.  HENT:     Head: Normocephalic and atraumatic.  Cardiovascular:     Rate and Rhythm: Normal rate and regular rhythm.     Heart sounds: Normal heart sounds.  Pulmonary:     Effort: Pulmonary effort is normal.     Breath sounds: Normal breath sounds.  Abdominal:     General: Bowel sounds are normal.     Palpations: Abdomen is soft.     Tenderness: There is no abdominal tenderness.  Musculoskeletal:     Right lower leg: No edema.     Left lower leg: No edema.  Skin:    General: Skin is warm and dry.  Neurological:     Mental Status: She is alert and oriented to person, place, and time.  Psychiatric:        Attention and Perception: Attention normal.        Mood and Affect: Mood normal.        Behavior: Behavior is cooperative.    ED Results / Procedures / Treatments   Labs (all labs ordered are listed, but only abnormal results are displayed) Labs Reviewed  RESP PANEL BY RT-PCR (FLU A&B, COVID) ARPGX2 - Abnormal; Notable for the following components:      Result Value   SARS Coronavirus 2 by RT PCR POSITIVE (*)    All other components within normal limits  CBC WITH DIFFERENTIAL/PLATELET - Abnormal; Notable for the following components:   Lymphs Abs 0.4 (*)    All other components within normal limits  COMPREHENSIVE METABOLIC PANEL - Abnormal; Notable for the following components:   Glucose, Bld 114 (*)    All  other components within normal limits  PREGNANCY, URINE  TROPONIN I (HIGH SENSITIVITY)   EKG EKG Interpretation  Date/Time:  Wednesday October 31 2020 06:50:48 EDT Ventricular Rate:  95 PR Interval:  152 QRS Duration: 100 QT Interval:  332 QTC Calculation: 418 R Axis:   21 Text Interpretation: Sinus rhythm Borderline T abnormalities, diffuse leads Rate is faster Confirmed by Shanon Rosser [phone]) on 10/31/2020 6:53:04 AM  Radiology DG Chest Port 1 View  Result Date: 10/31/2020 CLINICAL DATA:  Chest tightness. EXAM: PORTABLE CHEST 1 VIEW COMPARISON:  CT 01/30/2020.  Chest x-ray 11/13/2017. FINDINGS: Mediastinum and hilar structures normal. Heart size normal. Low lung volumes. Bilateral interstitial prominence again noted consistent chronic interstitial lung disease. No interim change. Reference is made to prior CT report of 01/30/2020. No new focal infiltrate. No pleural effusion or pneumothorax. Mild elevation left hemidiaphragm. IMPRESSION: Low lung volumes. Bilateral interstitial prominence again noted consistent chronic interstitial lung disease. No interim change. Reference is made to prior CT report 01/30/2020. No new infiltrate noted. Electronically Signed   By: Marcello Moores  Register   On: 10/31/2020 08:16   Procedures Procedures   Medications Ordered in ED Medications  acetaminophen (TYLENOL) tablet 650 mg (650 mg Oral Given 10/31/20 6222)   ED Course  I have reviewed the triage vital signs and the nursing notes.  Pertinent labs & imaging results that were available during my care of the patient were reviewed by me and considered in my medical decision making (see chart for details).   MDM Rules/Calculators/A&P                          On  arrival to ED patient is afebrile, hemodynamically stable, sating well on room air.  ECG without ischemic changes, very similar to previous study. Symptoms most concerning for viral-like illness. Will obtain influenza and COVID-19 testing, CBC, CMP,  CXR, and trend troponin.  Patient's COVID-19 test positive. CBC, CMP within normal limits, troponin negative. CXR stable, no new infiltrates. Patient continues to be hemodynamically stable, sating well on room air, ready for discharge. Discussed diagnosis with patient and isolation precautions. Also encouraged patient to discuss with close contacts for quarantine. Patient instructed to return to ED if worsening dyspnea or chest pain occurs.   Final Clinical Impression(s) / ED Diagnoses Final diagnoses:  [id number]   Rx / DC Orders ED Discharge Orders    None       Sanjuan Dame, MD 10/31/20 [id number]    Lucrezia Starch, MD 11/01/20 1319

## 2020-11-01 ENCOUNTER — Telehealth: Payer: Self-pay

## 2020-11-01 ENCOUNTER — Encounter (HOSPITAL_COMMUNITY)
Admission: RE | Admit: 2020-11-01 | Discharge: 2020-11-01 | Disposition: A | Discharge status: Home / Self Care | Payer: Managed Care, Other (non HMO) | Source: Ambulatory Visit | Attending: Emergency Medicine | Admitting: Emergency Medicine

## 2020-11-01 ENCOUNTER — Other Ambulatory Visit: Payer: Self-pay | Admitting: Oncology

## 2020-11-01 ENCOUNTER — Telehealth: Payer: Self-pay | Admitting: Oncology

## 2020-11-01 ENCOUNTER — Other Ambulatory Visit (HOSPITAL_COMMUNITY): Payer: Self-pay

## 2020-11-01 ENCOUNTER — Encounter: Payer: Self-pay | Admitting: Oncology

## 2020-11-01 ENCOUNTER — Ambulatory Visit: Payer: Managed Care, Other (non HMO)

## 2020-11-01 DIAGNOSIS — U071 COVID-19: Secondary | ICD-10-CM

## 2020-11-01 MED ORDER — MOLNUPIRAVIR EUA 200MG CAPSULE
4.0000 | ORAL_CAPSULE | Freq: Two times a day (BID) | ORAL | 0 refills | Status: AC
  Filled 2020-11-01: qty 40, 5d supply, fill #0

## 2020-11-01 NOTE — Telephone Encounter (Signed)
Called to discuss with patient about COVID-19 symptoms and the use of one of the available treatments for those with mild to moderate Covid symptoms and at a high risk of hospitalization.  Pt appears to qualify for outpatient treatment due to co-morbid conditions and/or a member of an at-risk group in accordance with the FDA Emergency Use Authorization.    Symptom onset: Cough,shortness of breath 10/30/20 Vaccinated: Yes Booster? No Immunocompromised? Yes Qualifiers: Asthma,LAM  Pt. Would like to speak with APP.  Marcello Moores

## 2020-11-01 NOTE — Telephone Encounter (Signed)
Outpatient Oral COVID Treatment Note  I connected with Brenda Williams on 11/01/2020/10:24 AM by telephone and verified that I am speaking with the correct person using two identifiers.  I discussed the limitations, risks, security, and privacy concerns of performing an evaluation and management service by telephone and the availability of in person appointments. I also discussed with the patient that there may be a patient responsible charge related to this service. The patient expressed understanding and agreed to proceed.  Patient location: Home Provider location: Clinic   Diagnosis: COVID-19 infection  Purpose of visit: Discussion of potential use of Molnupiravir or Paxlovid, a new treatment for mild to moderate COVID-19 viral infection in non-hospitalized patients.   Subjective: Patient is a 40 y.o. female who has been diagnosed with COVID 19 viral infection.  Their symptoms began on 10/30/20.   Past Medical History:  Diagnosis Date  . Abnormal Pap smear 2006   leep  . Anxiety   . Arthritis    osteoarthritis  . Asthma   . Complication of anesthesia    [facility], HAD ??  SEIZURE/ SHAKING  . Cough   . Depression   . Fatigue   . GERD (gastroesophageal reflux disease)   . Herpes    never had outbreak. pos per blood, doesn't know which type  . Lactose intolerance   . Lymphangiomatosis   . Polycystic ovarian syndrome   . Prediabetes   . Sleep apnea    uses CPAP  . SVD (spontaneous vaginal delivery)    x 1  . Varicose vein of leg   . Vitamin D deficiency     Allergies  Allergen Reactions  . Fentanyl Itching    Benadryl effective for itching.     Current Outpatient Medications:  .  albuterol (VENTOLIN HFA) 108 (90 Base) MCG/ACT inhaler, Inhale 2 puffs into the lungs every 4 (four) hours as needed for wheezing or shortness of breath., Disp: 18 g, Rfl: 0 .  budesonide-formoterol (SYMBICORT) 80-4.5 MCG/ACT inhaler, Inhale 2 puffs into the lungs in the morning  and at bedtime., Disp: 10.2 g, Rfl: 12 .  dextromethorphan-guaiFENesin (MUCINEX DM) 30-600 MG 12hr tablet, Take 1 tablet by mouth 2 (two) times daily. (Patient not taking: Reported on 10/12/2020), Disp: 20 tablet, Rfl: 0 .  fluticasone (FLONASE) 50 MCG/ACT nasal spray, Place 1-2 sprays into both nostrils daily., Disp: 16 g, Rfl: 0 .  ibuprofen (ADVIL) 600 MG tablet, Take 1 tablet (600 mg total) by mouth every 6 (six) hours as needed., Disp: 30 tablet, Rfl: 0 .  pantoprazole (PROTONIX) 40 MG tablet, Take 1 tablet (40 mg total) by mouth daily., Disp: 30 tablet, Rfl: 1 .  sirolimus (RAPAMUNE) 1 MG tablet, Take 1 tablet (1 mg total) by mouth 2 (two) times daily., Disp: 180 tablet, Rfl: 1  Objective: Patient appears/sounds stable.  They are in no apparent distress.  Breathing is non labored.  Mood and behavior are normal.  Laboratory Data:  Recent Results (from the past 2160 hour(s))  Sirolimus Level     Status: None   Collection Time: 08/03/20  4:37 PM  Result Value Ref Range   Sirolimus (Rapamycin) 13.5 3.0 - 18.0 mcg/L    Comment: . This test was developed and its analytical performance characteristics have been determined by [place], [place]. It has not been cleared or approved by the U.S. Food and Drug Administration. This assay has been validated pursuant to the CLIA regulations and is used for  clinical purposes. .   VEGF, Serum     Status: None   Collection Time: 08/03/20  4:37 PM  Result Value Ref Range   VEGF, Serum 462 62 - 707 pg/mL    Comment: [employer] Quantikine Enzyme Immunoassay (EIA) Results of this test are labeled for research purposes only by the assay's manufacturer. The performance characteristics of this assay have not been established by the manufacturer. The result should not be used for treatment or for diagnostic purposes without confirmation of the diagnosis by another medically established diagnostic product or procedure.  The performance characteristics were determined by [facility]. Values obtained with different assay methods or kits cannot be used interchangeably. Results cannot be interpreted as absolute evidence of the presence or absence of malignant disease.   POCT rapid strep A     Status: None   Collection Time: 09/25/20  8:56 AM  Result Value Ref Range   Rapid Strep A Screen Negative Negative  Culture, group A strep     Status: None   Collection Time: 09/25/20  9:04 AM   Specimen: Throat  Result Value Ref Range   Specimen Description THROAT    Special Requests NONE    Culture      NO GROUP A STREP (S.PYOGENES) ISOLATED Performed at [facility], [place] [address], [place], [place] [address]    Report Status 09/27/2020 FINAL   Novel Coronavirus, NAA ([facility])     Status: None   Collection Time: 09/25/20  9:06 AM   Specimen: Nasopharyngeal Swab; Nasopharyngeal(NP) swabs in vial transport medium   Nasopharynge  Result Value Ref Range   SARS-CoV-2, NAA Not Detected Not Detected    Comment: This nucleic acid amplification test was developed and its performance characteristics determined by [employer]. Nucleic acid amplification tests include RT-PCR and TMA. This test has not been FDA cleared or approved. This test has been authorized by FDA under an Emergency Use Authorization (EUA). This test is only authorized for the duration of time the declaration that circumstances exist justifying the authorization of the emergency use of in vitro diagnostic tests for detection of SARS-CoV-2 virus and/or diagnosis of COVID-19 infection under section 564(b)(1) of the Act, 21 U.S.C. 888KCM-0(L) (1), unless the authorization is terminated or revoked sooner. When diagnostic testing is negative, the possibility of a false negative result should be considered in the context of a patient's recent exposures and the presence of clinical signs and symptoms consistent with COVID-19. An individual  without symptoms of COVID-19 and who is not shedding SARS-CoV-2 virus wo uld expect to have a negative (not detected) result in this assay.   SARS-COV-2, NAA 2 DAY TAT     Status: None   Collection Time: 09/25/20  9:06 AM   Nasopharynge  Result Value Ref Range   SARS-CoV-2, NAA 2 DAY TAT Performed   Pregnancy, urine     Status: None   Collection Time: 10/31/20  7:21 AM  Result Value Ref Range   Preg Test, Ur NEGATIVE NEGATIVE    Comment:        THE SENSITIVITY OF THIS METHODOLOGY IS >20 mIU/mL. Performed at [facility], [address] [place], [place] [address]   CBC with Differential     Status: Abnormal   Collection Time: 10/31/20  7:44 AM  Result Value Ref Range   WBC 4.3 4.0 - 10.5 K/uL   RBC 4.96 3.87 - 5.11 MIL/uL   Hemoglobin 14.0 12.0 - 15.0  g/dL   HCT 43.2 36.0 - 46.0 %   MCV 87.1 80.0 - 100.0 fL   MCH 28.2 26.0 - 34.0 pg   MCHC 32.4 30.0 - 36.0 g/dL   RDW 15.3 11.5 - 15.5 %   Platelets 336 150 - 400 K/uL   nRBC 0.0 0.0 - 0.2 %   Neutrophils Relative % 81 %   Neutro Abs 3.5 1.7 - 7.7 K/uL   Lymphocytes Relative 10 %   Lymphs Abs 0.4 (L) 0.7 - 4.0 K/uL   Monocytes Relative 7 %   Monocytes Absolute 0.3 0.1 - 1.0 K/uL   Eosinophils Relative 1 %   Eosinophils Absolute 0.0 0.0 - 0.5 K/uL   Basophils Relative 1 %   Basophils Absolute 0.0 0.0 - 0.1 K/uL   Immature Granulocytes 0 %   Abs Immature Granulocytes 0.01 0.00 - 0.07 K/uL    Comment: Performed at [facility], [address] [place], [place] [address]  Comprehensive metabolic panel     Status: Abnormal   Collection Time: 10/31/20  7:44 AM  Result Value Ref Range   Sodium 136 135 - 145 mmol/L   Potassium 3.6 3.5 - 5.1 mmol/L   Chloride 104 98 - 111 mmol/L   CO2 22 22 - 32 mmol/L   Glucose, Bld 114 (H) 70 - 99 mg/dL    Comment: Glucose reference range applies only to samples taken after fasting for at least 8 hours.   BUN 9 6 - 20 mg/dL   Creatinine, Ser 0.65 0.44 - 1.00 mg/dL    Calcium 9.4 8.9 - 10.3 mg/dL   Total Protein 7.9 6.5 - 8.1 g/dL   Albumin 4.0 3.5 - 5.0 g/dL   AST 19 15 - 41 U/L   ALT 21 0 - 44 U/L   Alkaline Phosphatase 52 38 - 126 U/L   Total Bilirubin 0.6 0.3 - 1.2 mg/dL   GFR, Estimated >60 >60 mL/min    Comment: (NOTE) Calculated using the CKD-EPI Creatinine Equation (2021)    Anion gap 10 5 - 15    Comment: Performed at [facility], [address] [place], [place] [address]  Resp Panel by RT-PCR (Flu A&B, Covid) Nasopharyngeal Swab     Status: Abnormal   Collection Time: 10/31/20  7:44 AM   Specimen: Nasopharyngeal Swab; Nasopharyngeal(NP) swabs in vial transport medium  Result Value Ref Range   SARS Coronavirus 2 by RT PCR POSITIVE (A) NEGATIVE    Comment: RESULT CALLED TO, READ BACK BY AND VERIFIED WITH: SAM COBLE RN @0834  10/31/2020 OLSONM (NOTE) SARS-CoV-2 target nucleic acids are DETECTED.  The SARS-CoV-2 RNA is generally detectable in upper respiratory specimens during the acute phase of infection. Positive results are indicative of the presence of the identified virus, but do not rule out bacterial infection or co-infection with other pathogens not detected by the test. Clinical correlation with patient history and other diagnostic information is necessary to determine patient infection status. The expected result is Negative.  Fact Sheet for Patients: EntrepreneurPulse.com.au  Fact Sheet for Healthcare Providers: IncredibleEmployment.be  This test is not yet approved or cleared by the [place] FDA and  has been authorized for detection and/or diagnosis of SARS-CoV-2 by FDA under an Emergency Use Authorization (EUA).  This EUA will remain in effect (meaning this test can  be used) for the duration of  the COVID-19 declaration under Section 564(b)(1) of the Act, 21 U.S.C. section 360bbb-3(b)(1), unless the authorization is  terminated or revoked sooner.     Influenza A  by PCR NEGATIVE NEGATIVE   Influenza B by PCR NEGATIVE NEGATIVE    Comment: (NOTE) The Xpert Xpress SARS-CoV-2/FLU/RSV plus assay is intended as an aid in the diagnosis of influenza from Nasopharyngeal swab specimens and should not be used as a sole basis for treatment. Nasal washings and aspirates are unacceptable for Xpert Xpress SARS-CoV-2/FLU/RSV testing.  Fact Sheet for Patients: EntrepreneurPulse.com.au  Fact Sheet for Healthcare Providers: IncredibleEmployment.be  This test is not yet approved or cleared by the [place] FDA and has been authorized for detection and/or diagnosis of SARS-CoV-2 by FDA under an Emergency Use Authorization (EUA). This EUA will remain in effect (meaning this test can be used) for the duration of the COVID-19 declaration under Section 564(b)(1) of the Act, 21 U.S.C. section 360bbb-3(b)(1), unless the authorization is terminated or revoked.  Performed at [facility], [address] [place], [place] [address]   Troponin I (High Sensitivity)     Status: None   Collection Time: 10/31/20  7:44 AM  Result Value Ref Range   Troponin I (High Sensitivity) 4 <18 ng/L    Comment: (NOTE) Elevated high sensitivity troponin I (hsTnI) values and significant  changes across serial measurements may suggest ACS but many other  chronic and acute conditions are known to elevate hsTnI results.  Refer to the "Links" section for chest pain algorithms and additional  guidance. Performed at [facility], [address], [place], [place] [address]      Assessment: 40 y.o. female with mild/moderate COVID 19 viral infection diagnosed on 10/30/20 and is at high risk for progression to severe COVID 19.  Plan:  This patient is a 40 y.o. female that meets the following criteria for Emergency Use Authorization of: Molnupiravir  1. Age >18 yr 2. SARS-COV-2 positive test 3. Symptom onset < 5  days 4. Mild-to-moderate COVID disease with high risk for severe progression to hospitalization or death   I have spoken and communicated the following to the patient or parent/caregiver regarding: 1. Molnupiravir is an unapproved drug that is authorized for use under an Print production planner.  2. There are no adequate, approved, available products for the treatment of COVID-19 in adults who have mild-to-moderate COVID-19 and are at high risk for progressing to severe COVID-19, including hospitalization or death. 3. Other therapeutics are currently authorized. For additional information on all products authorized for treatment or prevention of COVID-19, please see [employer]Em[employer]ium.pl.  4. There are benefits and risks of taking this treatment as outlined in the "Fact Sheet for Patients and Caregivers."  5. "Fact Sheet for Patients and Caregivers" was reviewed with patient. A hard copy will be provided to patient from pharmacy prior to the patient receiving treatment. 6. Patients should continue to self-isolate and use infection control measures (e.g., wear mask, isolate, social distance, avoid sharing personal items, clean and disinfect "high touch" surfaces, and frequent handwashing) according to CDC guidelines.  7. The patient or parent/caregiver has the option to accept or refuse treatment. 8. [place] has established a pregnancy surveillance program. 9. Females of childbearing potential should use a reliable method of contraception correctly and consistently, as applicable, for the duration of treatment and for 4 days after the last dose of Molnupiravir. 60. Males of reproductive potential who are sexually active with females of childbearing potential should use a reliable method of contraception correctly and consistently during treatment and for at  least 3 months after the last  dose. 11. Pregnancy status and risk was assessed. Patient verbalized understanding of precautions.   After reviewing above information with the patient, the patient agrees to receive molnupiravir.  Follow up instructions:    . Take prescription BID x 5 days as directed . Reach out to pharmacist for counseling on medication if desired . For concerns regarding further COVID symptoms please follow up with your PCP or urgent care . For urgent or life-threatening issues, seek care at your local emergency department  The patient was provided an opportunity to ask questions, and all were answered. The patient agreed with the plan and demonstrated an understanding of the instructions.   Script sent to [facility] and opted to pick up RX.  The patient was advised to call their PCP or seek an in-person evaluation if the symptoms worsen or if the condition fails to improve as anticipated.   I provided 15 minutes of non face-to-face telephone visit time during this encounter, and > 50% was spent counseling as documented under my assessment & plan.  Jacquelin Hawking, NP   I connected by phone with Brenda Williams on 11/01/2020 at 10:25 AM to discuss the potential use of a new treatment for mild to moderate COVID-19 viral infection in non-hospitalized patients.  This patient is a 40 y.o. female that meets the FDA criteria for Emergency Use Authorization of COVID monoclonal antibody bebtelovimab.  Has a (+) direct SARS-CoV-2 viral test result  Has mild or moderate COVID-19   Is NOT hospitalized due to COVID-19  Is within 10 days of symptom onset  Has at least one of the high risk factor(s) for progression to severe COVID-19 and/or hospitalization as defined in EUA.  Specific high risk criteria : Immunosuppressive Disease or Treatment and Chronic Lung Disease   I have spoken and communicated the following to the patient or parent/caregiver regarding COVID monoclonal  antibody treatment:  2. FDA has authorized the emergency use for the treatment of mild to moderate COVID-19 in adults and pediatric patients with positive results of direct SARS-CoV-2 viral testing who are 66 years of age and older weighing at least 40 kg, and who are at high risk for progressing to severe COVID-19 and/or hospitalization.  3. The significant known and potential risks and benefits of COVID monoclonal antibody, and the extent to which such potential risks and benefits are unknown.  4. Information on available alternative treatments and the risks and benefits of those alternatives, including clinical trials.  5. Patients treated with COVID monoclonal antibody should continue to self-isolate and use infection control measures (e.g., wear mask, isolate, social distance, avoid sharing personal items, clean and disinfect "high touch" surfaces, and frequent handwashing) according to CDC guidelines.   6. The patient or parent/caregiver has the option to accept or refuse COVID monoclonal antibody treatment.  7. Discussion about the monoclonal antibody infusion does not ensure treatment. The patient will be placed on a list and scheduled according to risk, symptom onset and availability. A scheduler will reach to the patient to let them know if we can accommodate their infusion or not.  After reviewing this information with the patient, the patient has agreed to receive one of the available covid 19 monoclonal antibodies and will be provided an appropriate fact sheet prior to infusion. Jacquelin Hawking, NP 11/01/2020 10:25 AM     11/01/2020 /10:24 AM   I connected by phone with Brenda Williams on 11/01/2020 at  10:34 AM to discuss the potential use of a new treatment for mild to moderate COVID-19 viral infection in non-hospitalized patients.  This patient is a 40 y.o. female that meets the FDA criteria for Emergency Use Authorization of COVID monoclonal antibody bebtelovimab.  Has a (+)  direct SARS-CoV-2 viral test result  Has mild or moderate COVID-19   Is NOT hospitalized due to COVID-19  Is within 10 days of symptom onset  Has at least one of the high risk factor(s) for progression to severe COVID-19 and/or hospitalization as defined in EUA.  Specific high risk criteria : Immunosuppressive Disease or Treatment and Chronic Lung Disease   I have spoken and communicated the following to the patient or parent/caregiver regarding COVID monoclonal antibody treatment:  8. FDA has authorized the emergency use for the treatment of mild to moderate COVID-19 in adults and pediatric patients with positive results of direct SARS-CoV-2 viral testing who are 28 years of age and older weighing at least 40 kg, and who are at high risk for progressing to severe COVID-19 and/or hospitalization.  9. The significant known and potential risks and benefits of COVID monoclonal antibody, and the extent to which such potential risks and benefits are unknown.  10. Information on available alternative treatments and the risks and benefits of those alternatives, including clinical trials.  11. Patients treated with COVID monoclonal antibody should continue to self-isolate and use infection control measures (e.g., wear mask, isolate, social distance, avoid sharing personal items, clean and disinfect "high touch" surfaces, and frequent handwashing) according to CDC guidelines.   12. The patient or parent/caregiver has the option to accept or refuse COVID monoclonal antibody treatment.  13. Discussion about the monoclonal antibody infusion does not ensure treatment. The patient will be placed on a list and scheduled according to risk, symptom onset and availability. A scheduler will reach to the patient to let them know if we can accommodate their infusion or not.  After reviewing this information with the patient, the patient has agreed to receive one of the available covid 19 monoclonal antibodies  and will be provided an appropriate fact sheet prior to infusion. Jacquelin Hawking, NP 11/01/2020 10:34 AM

## 2020-11-02 ENCOUNTER — Ambulatory Visit (INDEPENDENT_AMBULATORY_CARE_PROVIDER_SITE_OTHER): Payer: Managed Care, Other (non HMO)

## 2020-11-02 ENCOUNTER — Other Ambulatory Visit: Payer: Self-pay

## 2020-11-02 DIAGNOSIS — U071 COVID-19: Secondary | ICD-10-CM | POA: Diagnosis not present

## 2020-11-02 MED ORDER — BEBTELOVIMAB 175 MG/2 ML IV (EUA)
175.0000 mg | Freq: Once | INTRAMUSCULAR | Status: AC
  Administered 2020-11-02: 175 mg via INTRAVENOUS

## 2020-11-02 MED ORDER — ALBUTEROL SULFATE HFA 108 (90 BASE) MCG/ACT IN AERS: 2.0000 | INHALATION_SPRAY | Freq: Once | RESPIRATORY_TRACT | Status: AC | PRN

## 2020-11-02 MED ORDER — SODIUM CHLORIDE 0.9 % IV SOLN: INTRAVENOUS | Status: DC | PRN

## 2020-11-02 MED ORDER — EPINEPHRINE 0.3 MG/0.3ML IJ SOAJ: 0.3000 mg | Freq: Once | INTRAMUSCULAR | Status: AC | PRN

## 2020-11-02 MED ORDER — DIPHENHYDRAMINE HCL 50 MG/ML IJ SOLN: 50.0000 mg | Freq: Once | INTRAMUSCULAR | Status: AC | PRN

## 2020-11-02 MED ORDER — FAMOTIDINE IN NACL 20-0.9 MG/50ML-% IV SOLN: 20.0000 mg | Freq: Once | INTRAVENOUS | Status: AC | PRN

## 2020-11-02 MED ORDER — METHYLPREDNISOLONE SODIUM SUCC 125 MG IJ SOLR: 125.0000 mg | Freq: Once | INTRAMUSCULAR | Status: AC | PRN

## 2020-11-02 NOTE — Progress Notes (Signed)
Diagnosis: Covid-19  Provider:  Marshell Garfinkel, MD  Procedure: Infusion  IV Type: Peripheral, IV Location: L Hand  bebtelovimab, Dose: 175 mg  Infusion Start Time: 4481  Infusion Stop Time: 1527  Post Infusion IV Care: Observation period completed  Discharge: Condition: Good, Destination: Home . AVS provided to patient.   Performed by:  Paul Dykes, RN

## 2020-11-02 NOTE — Patient Instructions (Signed)
10 Things You Can Do to Manage Your COVID-19 Symptoms at Home If you have possible or confirmed COVID-19: 1. Stay home except to get medical care. 2. Monitor your symptoms carefully. If your symptoms get worse, call your healthcare provider immediately. 3. Get rest and stay hydrated. 4. If you have a medical appointment, call the healthcare provider ahead of time and tell them that you have or may have COVID-19. 5. For medical emergencies, call 911 and notify the dispatch personnel that you have or may have COVID-19. 6. Cover your cough and sneezes with a tissue or use the inside of your elbow. 7. Wash your hands often with soap and water for at least 20 seconds or clean your hands with an alcohol-based hand sanitizer that contains at least 60% alcohol. 8. As much as possible, stay in a specific room and away from other people in your home. Also, you should use a separate bathroom, if available. If you need to be around other people in or outside of the home, wear a mask. 9. Avoid sharing personal items with other people in your household, like dishes, towels, and bedding. 10. Clean all surfaces that are touched often, like counters, tabletops, and doorknobs. Use household cleaning sprays or wipes according to the label instructions. cdc.gov/coronavirus 01/27/2020 This information is not intended to replace advice given to you by your health care provider. Make sure you discuss any questions you have with your health care provider. Document Revised: 05/14/2020 Document Reviewed: 05/14/2020 Elsevier Patient Education  2021 Elsevier Inc.  What types of side effects do monoclonal antibody drugs cause?  Common side effects  In general, the more common side effects caused by monoclonal antibody drugs include: . Allergic reactions, such as hives or itching . Flu-like signs and symptoms, including chills, fatigue, fever, and muscle aches and pains . Nausea, vomiting . Diarrhea . Skin  rashes . Low blood pressure   The CDC is recommending patients who receive monoclonal antibody treatments wait at least 90 days before being vaccinated.  Currently, there are no data on the safety and efficacy of mRNA COVID-19 vaccines in persons who received monoclonal antibodies or convalescent plasma as part of COVID-19 treatment. Based on the estimated half-life of such therapies as well as evidence suggesting that reinfection is uncommon in the 90 days after initial infection, vaccination should be deferred for at least 90 days, as a precautionary measure until additional information becomes available, to avoid interference of the antibody treatment with vaccine-induced immune responses.  

## 2020-11-06 ENCOUNTER — Encounter (HOSPITAL_COMMUNITY)
Admission: RE | Admit: 2020-11-06 | Discharge: 2020-11-06 | Disposition: A | Discharge status: Home / Self Care | Payer: Managed Care, Other (non HMO) | Source: Ambulatory Visit | Attending: Emergency Medicine | Admitting: Emergency Medicine

## 2020-11-06 DIAGNOSIS — J8481 Lymphangioleiomyomatosis: Secondary | ICD-10-CM

## 2020-11-06 NOTE — Progress Notes (Signed)
Pulmonary Individual Treatment Plan  Patient Details  Name: Brenda Williams MRN: [id number] Date of Birth: 1981-03-13 Referring Provider:   April Manson Pulmonary Rehab Walk Test from 10/12/2020 in [facility]  Referring Provider Dr. 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  albuterol (VENTOLIN HFA) 108 (90 Base) MCG/ACT inhaler, Inhale 2 puffs into the lungs every 4 (four) hours as needed for wheezing or shortness of breath., Disp: 18 g, Rfl: 0 .  budesonide-formoterol (SYMBICORT) 80-4.5 MCG/ACT inhaler, Inhale 2 puffs into the lungs in the morning and at bedtime., Disp: 10.2 g, Rfl: 12 .  dextromethorphan-guaiFENesin (MUCINEX DM) 30-600 MG 12hr tablet, Take 1 tablet by mouth 2 (two) times daily. (Patient not taking: Reported on 10/12/2020), Disp: 20 tablet, Rfl: 0 .  fluticasone (FLONASE) 50 MCG/ACT nasal spray, Place 1-2 sprays into both nostrils daily., Disp: 16 g, Rfl: 0 .  ibuprofen (ADVIL) 600 MG tablet, Take 1 tablet (600 mg total) by mouth every 6 (six) hours as needed., Disp: 30 tablet, Rfl: 0 .  molnupiravir EUA 200 mg CAPS, Take 4 capsules (800 mg total) by mouth 2 (two) times daily for 5 days., Disp: 40 capsule, Rfl: 0 .  pantoprazole (PROTONIX) 40 MG tablet, Take 1 tablet (40 mg total) by mouth daily., Disp: 30 tablet, Rfl: 1 .  sirolimus (RAPAMUNE) 1 MG tablet, Take 1 tablet (1 mg total) by mouth 2 (two) times daily., Disp: 180 tablet, Rfl: 1  Current Facility-Administered Medications:  .  0.9 %  sodium chloride infusion, , Intravenous, PRN, Quay Burow Wandra Feinstein, NP  Past Medical History: Past Medical History:  Diagnosis Date  . Abnormal Pap smear 2006   leep  . Anxiety   . Arthritis     osteoarthritis  . Asthma   . Complication of anesthesia    2014 d+c [facility], HAD ??  SEIZURE/ SHAKING  . Cough   . Depression   . Fatigue   . GERD (gastroesophageal reflux disease)   . Herpes    never had outbreak. pos per blood, doesn't know which type  . Lactose intolerance   . Lymphangiomatosis   . Polycystic ovarian syndrome   . Prediabetes   . Sleep apnea    uses CPAP  . SVD (spontaneous vaginal delivery)    x 1  . Varicose vein of leg   . Vitamin D deficiency     Tobacco Use: Social History   Tobacco Use  Smoking Status Never Smoker  Smokeless Tobacco Never Used    Labs: Recent Review Flowsheet Data    Labs for ITP Cardiac and Pulmonary Rehab Latest Ref Rng & Units 09/18/2015 12/26/2015 12/29/2015 01/28/2017 06/22/2019   Cholestrol 100 - 199 mg/dL 223(H) - - 233(H) -   LDLCALC 0 - 99 mg/dL 154(H) - - 160(H) -   LDLDIRECT mg/dL - - - - -   HDL >39 mg/dL 57.00 - - 53 -   Trlycerides 0 - 149 mg/dL 63.0 - - 99 -   Hemoglobin A1c 4.6 - 6.5 % 6.0 - - 6.1(H) 6.1   PHART 7.350 - 7.450 - 7.426 7.361 - -   PCO2ART 35.0 - 45.0 mmHg - 38.0 45.5(H) - -   HCO3 20.0 - 24.0 mEq/L -  24.5(H) 25.8(H) - -   TCO2 0 - 100 mmol/L - 25.7 27 - -   O2SAT % - 97.0 96.0 - -      Capillary Blood Glucose: Lab Results  Component Value Date   GLUCAP 106 (H) 11/18/2017   GLUCAP 116 (H) 12/31/2015   GLUCAP 84 12/30/2015   GLUCAP 124 (H) 12/30/2015   GLUCAP 136 (H) 12/29/2015     Pulmonary Assessment Scores:  Pulmonary Assessment Scores    Row Name 10/12/20 1218         ADL UCSD   ADL Phase Entry     SOB Score total 44           CAT Score   CAT Score 18           mMRC Score   mMRC Score 2           UCSD: Self-administered rating of dyspnea associated with activities of daily living (ADLs) 6-point scale (0 = "not at all" to 5 = "maximal or unable to do because of breathlessness")  Scoring Scores range from 0 to 120.  Minimally important difference is 5  units  CAT: CAT can identify the health impairment of COPD patients and is better correlated with disease progression.  CAT has a scoring range of zero to 40. The CAT score is classified into four groups of low (less than 10), medium (10 - 20), high (21-30) and very high (31-40) based on the impact level of disease on health status. A CAT score over 10 suggests significant symptoms.  A worsening CAT score could be explained by an exacerbation, poor medication adherence, poor inhaler technique, or progression of COPD or comorbid conditions.  CAT MCID is 2 points  mMRC: mMRC (Modified Medical Research Council) Dyspnea Scale is used to assess the degree of baseline functional disability in patients of respiratory disease due to dyspnea. No minimal important difference is established. A decrease in score of 1 point or greater is considered a positive change.   Pulmonary Function Assessment:  Pulmonary Function Assessment - 10/12/20 0936      Breath   Shortness of Breath Yes;Limiting activity           Exercise Target Goals: Exercise Program Goal: Individual exercise prescription set using results from initial 6 min walk test and THRR while considering  patient's activity barriers and safety.   Exercise Prescription Goal: Initial exercise prescription builds to 30-45 minutes a day of aerobic activity, 2-3 days per week.  Home exercise guidelines will be given to patient during program as part of exercise prescription that the participant will acknowledge.  Activity Barriers & Risk Stratification:  Activity Barriers & Cardiac Risk Stratification - 10/12/20 0934      Activity Barriers & Cardiac Risk Stratification   Activity Barriers Arthritis;Deconditioning;Shortness of Breath;Joint Problems           6 Minute Walk:  6 Minute Walk    Row Name 10/12/20 1207         6 Minute Walk   Phase Initial     Distance 1294 feet     Walk Time 6 minutes     # of Rest Breaks 0     MPH  2.45     METS 4.07     RPE 11     Perceived Dyspnea  2     VO2 Peak 14.26     Symptoms Yes (comment)     Comments pt complained of chest tightness but  is aware this is due to lung issue     Resting HR 79 bpm     Resting BP 130/82     Resting Oxygen Saturation  94 %     Exercise Oxygen Saturation  during 6 min walk 87 %     Max Ex. HR 112 bpm     Max Ex. BP 136/90     2 Minute Post BP 132/86           Interval HR   1 Minute HR 98     2 Minute HR 101     3 Minute HR 96     4 Minute HR 105     5 Minute HR 112     6 Minute HR 107     2 Minute Post HR 79     Interval Heart Rate? Yes           Interval Oxygen   Interval Oxygen? Yes     Baseline Oxygen Saturation % 94 %     1 Minute Oxygen Saturation % 92 %     1 Minute Liters of Oxygen 0 L     2 Minute Oxygen Saturation % 89 %     2 Minute Liters of Oxygen 0 L     3 Minute Oxygen Saturation % 87 %     3 Minute Liters of Oxygen 0 L     4 Minute Oxygen Saturation % 87 %     4 Minute Liters of Oxygen 0 L     5 Minute Oxygen Saturation % 87 %     5 Minute Liters of Oxygen 0 L     6 Minute Oxygen Saturation % 87 %     6 Minute Liters of Oxygen 0 L     2 Minute Post Oxygen Saturation % 96 %     2 Minute Post Liters of Oxygen 0 L            Oxygen Initial Assessment:  Oxygen Initial Assessment - 10/12/20 0936      Home Oxygen   Home Oxygen Device None    Sleep Oxygen Prescription CPAP    Home Exercise Oxygen Prescription None    Home Resting Oxygen Prescription None    Compliance with Home Oxygen Use Yes      Initial 6 min Walk   Oxygen Used None      Program Oxygen Prescription   Program Oxygen Prescription None      Intervention   Short Term Goals To learn and understand importance of monitoring SPO2 with pulse oximeter and demonstrate accurate use of the pulse oximeter.;To learn and understand importance of maintaining oxygen saturations>88%;To learn and demonstrate proper pursed lip breathing techniques or  other breathing techniques.;To learn and demonstrate proper use of respiratory medications    Long  Term Goals Verbalizes importance of monitoring SPO2 with pulse oximeter and return demonstration;Maintenance of O2 saturations>88%;Exhibits proper breathing techniques, such as pursed lip breathing or other method taught during program session;Compliance with respiratory medication;Demonstrates proper use of MDI's           Oxygen Re-Evaluation:  Oxygen Re-Evaluation    Row Name 11/05/20 1635             Program Oxygen Prescription   Program Oxygen Prescription None               Home Oxygen   Home Oxygen Device None       Sleep Oxygen  Prescription CPAP       Home Exercise Oxygen Prescription None       Home Resting Oxygen Prescription None       Compliance with Home Oxygen Use Yes               Goals/Expected Outcomes   Short Term Goals To learn and understand importance of monitoring SPO2 with pulse oximeter and demonstrate accurate use of the pulse oximeter.;To learn and understand importance of maintaining oxygen saturations>88%;To learn and demonstrate proper pursed lip breathing techniques or other breathing techniques.;To learn and demonstrate proper use of respiratory medications       Long  Term Goals Verbalizes importance of monitoring SPO2 with pulse oximeter and return demonstration;Maintenance of O2 saturations>88%;Exhibits proper breathing techniques, such as pursed lip breathing or other method taught during program session;Compliance with respiratory medication;Demonstrates proper use of MDI's       Goals/Expected Outcomes compliance and understanding of oxygen saturation and pursed lip breathing.              Oxygen Discharge (Final Oxygen Re-Evaluation):  Oxygen Re-Evaluation - 11/05/20 1635      Program Oxygen Prescription   Program Oxygen Prescription None      Home Oxygen   Home Oxygen Device None    Sleep Oxygen Prescription CPAP    Home Exercise  Oxygen Prescription None    Home Resting Oxygen Prescription None    Compliance with Home Oxygen Use Yes      Goals/Expected Outcomes   Short Term Goals To learn and understand importance of monitoring SPO2 with pulse oximeter and demonstrate accurate use of the pulse oximeter.;To learn and understand importance of maintaining oxygen saturations>88%;To learn and demonstrate proper pursed lip breathing techniques or other breathing techniques.;To learn and demonstrate proper use of respiratory medications    Long  Term Goals Verbalizes importance of monitoring SPO2 with pulse oximeter and return demonstration;Maintenance of O2 saturations>88%;Exhibits proper breathing techniques, such as pursed lip breathing or other method taught during program session;Compliance with respiratory medication;Demonstrates proper use of MDI's    Goals/Expected Outcomes compliance and understanding of oxygen saturation and pursed lip breathing.           Initial Exercise Prescription:  Initial Exercise Prescription - 10/12/20 1200      Date of Initial Exercise RX and Referring Provider   Date 10/12/20    Referring Provider Dr. Lamonte Sakai    Expected Discharge Date 12/13/20      Treadmill   MPH 2.2    Grade 0    Minutes 15      NuStep   Level 2    SPM 80    Minutes 15      Prescription Details   Frequency (times per week) 2    Duration Progress to 30 minutes of continuous aerobic without signs/symptoms of physical distress      Intensity   THRR 40-80% of Max Heartrate 72-143    Ratings of Perceived Exertion 11-13    Perceived Dyspnea 0-4      Progression   Progression Continue to progress workloads to maintain intensity without signs/symptoms of physical distress.      Resistance Training   Training Prescription Yes    Weight Red bands    Reps 10-15           Perform Capillary Blood Glucose checks as needed.  Exercise Prescription Changes:  Exercise Prescription Changes    Row Name  10/16/20 1500 10/30/20 1500  Response to Exercise   Blood Pressure (Admit) 124/86 118/82      Blood Pressure (Exercise) 122/84 120/76      Blood Pressure (Exit) 110/80 122/88      Heart Rate (Admit) 91 bpm 68 bpm      Heart Rate (Exercise) 115 bpm 102 bpm      Heart Rate (Exit) 91 bpm 78 bpm      Oxygen Saturation (Admit) 95 % 98 %      Oxygen Saturation (Exercise) 89 % 89 %      Oxygen Saturation (Exit) 95 % 93 %      Rating of Perceived Exertion (Exercise) 12 11      Perceived Dyspnea (Exercise) 2 1      Duration Progress to 30 minutes of  aerobic without signs/symptoms of physical distress Continue with 30 min of aerobic exercise without signs/symptoms of physical distress.      Intensity Other (comment)  40-80% of HRR THRR unchanged             Progression   Progression Continue to progress workloads to maintain intensity without signs/symptoms of physical distress. Continue to progress workloads to maintain intensity without signs/symptoms of physical distress.             Resistance Training   Training Prescription Yes Yes      Weight red band red bands      Reps 10-15 10-15      Time 10 Minutes 10 Minutes             Treadmill   MPH 2.2 2.2      Grade 0 0      Minutes 15 15             NuStep   Level 2 2      SPM 80 80      Minutes 1.8 15      METs -- 2.2             Exercise Comments:   Exercise Goals and Review:  Exercise Goals    Row Name 10/12/20 1204             Exercise Goals   Increase Physical Activity Yes       Intervention Provide advice, education, support and counseling about physical activity/exercise needs.;Develop an individualized exercise prescription for aerobic and resistive training based on initial evaluation findings, risk stratification, comorbidities and participant's personal goals.       Expected Outcomes Short Term: Attend rehab on a regular basis to increase amount of physical activity.;Long Term: Add in home  exercise to make exercise part of routine and to increase amount of physical activity.;Long Term: Exercising regularly at least 3-5 days a week.       Increase Strength and Stamina Yes       Intervention Provide advice, education, support and counseling about physical activity/exercise needs.;Develop an individualized exercise prescription for aerobic and resistive training based on initial evaluation findings, risk stratification, comorbidities and participant's personal goals.       Expected Outcomes Short Term: Increase workloads from initial exercise prescription for resistance, speed, and METs.;Short Term: Perform resistance training exercises routinely during rehab and add in resistance training at home;Long Term: Improve cardiorespiratory fitness, muscular endurance and strength as measured by increased METs and functional capacity (6MWT)       Able to understand and use rate of perceived exertion (RPE) scale Yes       Intervention Provide education and  explanation on how to use RPE scale       Expected Outcomes Short Term: Able to use RPE daily in rehab to express subjective intensity level;Long Term:  Able to use RPE to guide intensity level when exercising independently       Able to understand and use Dyspnea scale Yes       Intervention Provide education and explanation on how to use Dyspnea scale       Expected Outcomes Short Term: Able to use Dyspnea scale daily in rehab to express subjective sense of shortness of breath during exertion;Long Term: Able to use Dyspnea scale to guide intensity level when exercising independently       Knowledge and understanding of Target Heart Rate Range (THRR) Yes       Intervention Provide education and explanation of THRR including how the numbers were predicted and where they are located for reference       Expected Outcomes Short Term: Able to state/look up THRR;Long Term: Able to use THRR to govern intensity when exercising independently;Short Term:  Able to use daily as guideline for intensity in rehab       Understanding of Exercise Prescription Yes       Intervention Provide education, explanation, and written materials on patient's individual exercise prescription       Expected Outcomes Short Term: Able to explain program exercise prescription;Long Term: Able to explain home exercise prescription to exercise independently              Exercise Goals Re-Evaluation :  Exercise Goals Re-Evaluation    [place] Name 11/05/20 1632             Exercise Goal Re-Evaluation   Exercise Goals Review Increase Physical Activity;Increase Strength and Stamina;Able to understand and use rate of perceived exertion (RPE) scale;Able to understand and use Dyspnea scale;Knowledge and understanding of Target Heart Rate Range (THRR);Understanding of Exercise Prescription       Comments Pt has completed 3 exercise sessions and has tolerated well so far with no complaints or concerns. In the short time she has been in the program she has already progressed with MET level increases. She is exercising at 2.2 METS on the Nustep and 2.5 METS on the treadmill. Will continue to monitor and progress as she is able.       Expected Outcomes Through exercise at rehab and home, the patient will decrease shortness of breath with daily activities and feel confident in carying out an exercise regimn at home.              Discharge Exercise Prescription (Final Exercise Prescription Changes):  Exercise Prescription Changes - 10/30/20 1500      Response to Exercise   Blood Pressure (Admit) 118/82    Blood Pressure (Exercise) 120/76    Blood Pressure (Exit) 122/88    Heart Rate (Admit) 68 bpm    Heart Rate (Exercise) 102 bpm    Heart Rate (Exit) 78 bpm    Oxygen Saturation (Admit) 98 %    Oxygen Saturation (Exercise) 89 %    Oxygen Saturation (Exit) 93 %    Rating of Perceived Exertion (Exercise) 11    Perceived Dyspnea (Exercise) 1    Duration Continue with 30 min  of aerobic exercise without signs/symptoms of physical distress.    Intensity THRR unchanged      Progression   Progression Continue to progress workloads to maintain intensity without signs/symptoms of physical distress.  Resistance Training   Training Prescription Yes    Weight red bands    Reps 10-15    Time 10 Minutes      Treadmill   MPH 2.2    Grade 0    Minutes 15      NuStep   Level 2    SPM 80    Minutes 15    METs 2.2           Nutrition:  Target Goals: Understanding of nutrition guidelines, daily intake of sodium <1552m, cholesterol <2019m calories 30% from fat and 7% or less from saturated fats, daily to have 5 or more servings of fruits and vegetables.  Biometrics:  Pre Biometrics - 10/12/20 1205      Pre Biometrics   Grip Strength 31 kg   Left Hand           Nutrition Therapy Plan and Nutrition Goals:   Nutrition Assessments:  MEDIFICTS Score Key:  ?70 Need to make dietary changes   40-70 Heart Healthy Diet  ? 40 Therapeutic Level Cholesterol Diet   Picture Your Plate Scores:  <4<83nhealthy dietary pattern with much room for improvement.  41-50 Dietary pattern unlikely to meet recommendations for good health and room for improvement.  51-60 More healthful dietary pattern, with some room for improvement.   >60 Healthy dietary pattern, although there may be some specific behaviors that could be improved.    Nutrition Goals Re-Evaluation:   Nutrition Goals Discharge (Final Nutrition Goals Re-Evaluation):   Psychosocial: Target Goals: Acknowledge presence or absence of significant depression and/or stress, maximize coping skills, provide positive support system. Participant is able to verbalize types and ability to use techniques and skills needed for reducing stress and depression.  Initial Review & Psychosocial Screening:  Initial Psych Review & Screening - 10/12/20 0937      Initial Review   Current issues with History  of Depression;Current Stress Concerns   Diagnosed with depression when she was diagnosed with lung disease   Source of Stress Concerns Chronic Illness;Occupation    Comments Job is stressfull. And she is not able to to do things with her kids and that upsets her.      Family Dynamics   Good Support System? Yes   Friends, family, and Husband     Barriers   Psychosocial barriers to participate in program The patient should benefit from training in stress management and relaxation.      Screening Interventions   Interventions Encouraged to exercise    Expected Outcomes Long Term goal: The participant improves quality of Life and PHQ9 Scores as seen by post scores and/or verbalization of changes;Long Term Goal: Stressors or current issues are controlled or eliminated.           Quality of Life Scores:  Scores of 19 and below usually indicate a poorer quality of life in these areas.  A difference of  2-3 points is a clinically meaningful difference.  A difference of 2-3 points in the total score of the Quality of Life Index has been associated with significant improvement in overall quality of life, self-image, physical symptoms, and general health in studies assessing change in quality of life.  PHQ-9: Recent Review Flowsheet Data    [facility] 10/12/2020 01/28/2017 04/28/2016 09/17/2015   Decreased Interest 0 3 0 0   Down, Depressed, Hopeless 0 3 0 0   PHQ - 2 Score 0 6 0 0   Altered sleeping 0 1 - -  Tired, decreased energy 2 - - -   Change in appetite 0 3 - -   Feeling bad or failure about yourself  0 2 - -   Trouble concentrating 0 2 - -   Moving slowly or fidgety/restless 0 2 - -   Suicidal thoughts 0 1 - -   PHQ-9 Score - 17 - -   Difficult doing work/chores Not difficult at all - - -     Interpretation of Total Score  Total Score Depression Severity:  1-4 = Minimal depression, 5-9 = Mild depression, 10-14 = Moderate depression, 15-19 = Moderately severe  depression, 20-27 = Severe depression   Psychosocial Evaluation and Intervention:  Psychosocial Evaluation - 10/12/20 1039      Psychosocial Evaluation & Interventions   Expected Outcomes Decrease shortness of breath with daily activities and improve quality of life.           Psychosocial Re-Evaluation:  Psychosocial Re-Evaluation    Row Name 11/01/20 [phone] 11/01/20 7416           Psychosocial Re-Evaluation   Current issues with Current Depression;History of Depression History of Depression;Current Depression;Current Stress Concerns      Comments -- Jasira just started pulmonary rehab, has attended 3 exercise sessions, tested positive for Covid 10/31/2020, will be out for 11 days.      Expected Outcomes -- For Alexiss to return to pulmonary rehab and continue to evaluate psychosocial concerns every 30 days.      Interventions -- Stress management education;Encouraged to attend Pulmonary Rehabilitation for the exercise      Continue Psychosocial Services  -- Follow up required by staff             Initial Review   Source of Stress Concerns -- Chronic Illness;Unable to participate in former interests or hobbies;Unable to perform yard/household activities             Psychosocial Discharge (Final Psychosocial Re-Evaluation):  Psychosocial Re-Evaluation - 11/01/20 3845      Psychosocial Re-Evaluation   Current issues with History of Depression;Current Depression;Current Stress Concerns    Comments Mahrukh just started pulmonary rehab, has attended 3 exercise sessions, tested positive for Covid 10/31/2020, will be out for 11 days.    Expected Outcomes For Kailea to return to pulmonary rehab and continue to evaluate psychosocial concerns every 30 days.    Interventions Stress management education;Encouraged to attend Pulmonary Rehabilitation for the exercise    Continue Psychosocial Services  Follow up required by staff      Initial Review   Source of Stress Concerns Chronic  Illness;Unable to participate in former interests or hobbies;Unable to perform yard/household activities           Education: Education Goals: Education classes will be provided on a weekly basis, covering required topics. Participant will state understanding/return demonstration of topics presented.  Learning Barriers/Preferences:  Learning Barriers/Preferences - 10/12/20 0944      Learning Barriers/Preferences   Learning Barriers None    Learning Preferences Skilled Demonstration;Individual Instruction;Group Instruction           Education Topics: Risk Factor Reduction:  -Group instruction that is supported by a PowerPoint presentation. Instructor discusses the definition of a risk factor, different risk factors for pulmonary disease, and how the heart and lungs work together.     Nutrition for Pulmonary Patient:  -Group instruction provided by PowerPoint slides, verbal discussion, and written materials to support subject matter. The instructor gives an explanation and review of healthy  diet recommendations, which includes a discussion on weight management, recommendations for fruit and vegetable consumption, as well as protein, fluid, caffeine, fiber, sodium, sugar, and alcohol. Tips for eating when patients are short of breath are discussed.   Pursed Lip Breathing:  -Group instruction that is supported by demonstration and informational handouts. Instructor discusses the benefits of pursed lip and diaphragmatic breathing and detailed demonstration on how to preform both.     Oxygen Safety:  -Group instruction provided by PowerPoint, verbal discussion, and written material to support subject matter. There is an overview of "What is Oxygen" and "Why do we need it".  Instructor also reviews how to create a safe environment for oxygen use, the importance of using oxygen as prescribed, and the risks of noncompliance. There is a brief discussion on traveling with oxygen and resources  the patient may utilize.   Oxygen Equipment:  -Group instruction provided by [facility] Staff utilizing handouts, written materials, and equipment demonstrations.   Signs and Symptoms:  -Group instruction provided by written material and verbal discussion to support subject matter. Warning signs and symptoms of infection, stroke, and heart attack are reviewed and when to call the physician/911 reinforced. Tips for preventing the spread of infection discussed.   Advanced Directives:  -Group instruction provided by verbal instruction and written material to support subject matter. Instructor reviews Advanced Directive laws and proper instruction for filling out document.   Pulmonary Video:  -Group video education that reviews the importance of medication and oxygen compliance, exercise, good nutrition, pulmonary hygiene, and pursed lip and diaphragmatic breathing for the pulmonary patient.   Exercise for the Pulmonary Patient:  -Group instruction that is supported by a PowerPoint presentation. Instructor discusses benefits of exercise, core components of exercise, frequency, duration, and intensity of an exercise routine, importance of utilizing pulse oximetry during exercise, safety while exercising, and options of places to exercise outside of rehab.     Pulmonary Medications:  -Verbally interactive group education provided by instructor with focus on inhaled medications and proper administration.   Anatomy and Physiology of the Respiratory System and Intimacy:  -Group instruction provided by PowerPoint, verbal discussion, and written material to support subject matter. Instructor reviews respiratory cycle and anatomical components of the respiratory system and their functions. Instructor also reviews differences in obstructive and restrictive respiratory diseases with examples of each. Intimacy, Sex, and Sexuality differences are reviewed with a discussion on how relationships can  change when diagnosed with pulmonary disease. Common sexual concerns are reviewed.   MD DAY -A group question and answer session with a medical doctor that allows participants to ask questions that relate to their pulmonary disease state.   OTHER EDUCATION -Group or individual verbal, written, or video instructions that support the educational goals of the pulmonary rehab program.   Holiday Eating Survival Tips:  -Group instruction provided by PowerPoint slides, verbal discussion, and written materials to support subject matter. The instructor gives patients tips, tricks, and techniques to help them not only survive but enjoy the holidays despite the onslaught of food that accompanies the holidays.   Knowledge Questionnaire Score:  Knowledge Questionnaire Score - 10/12/20 1216      Knowledge Questionnaire Score   Pre Score 16/18           Core Components/Risk Factors/Patient Goals at Admission:  Personal Goals and Risk Factors at Admission - 10/12/20 0944      Core Components/Risk Factors/Patient Goals on Admission    Weight Management Yes;Obesity    Goal  Weight: Short Term 200 lb (90.7 kg)    Goal Weight: Long Term 175 lb (79.4 kg)    Improve shortness of breath with ADL's Yes    Intervention Provide education, individualized exercise plan and daily activity instruction to help decrease symptoms of SOB with activities of daily living.    Expected Outcomes Short Term: Improve cardiorespiratory fitness to achieve a reduction of symptoms when performing ADLs;Long Term: Be able to perform more ADLs without symptoms or delay the onset of symptoms           Core Components/Risk Factors/Patient Goals Review:   Goals and Risk Factor Review    Row Name 11/01/20 0841             Core Components/Risk Factors/Patient Goals Review   Personal Goals Review Develop more efficient breathing techniques such as purse lipped breathing and diaphragmatic breathing and practicing  self-pacing with activity.;Increase knowledge of respiratory medications and ability to use respiratory devices properly.;Improve shortness of breath with ADL's       Review Tested positive for Covid-19 4/20, has attended 3 exercise sessions, will be absent for 11 days after testing positive.       Expected Outcomes See admission goals.              Core Components/Risk Factors/Patient Goals at Discharge (Final Review):   Goals and Risk Factor Review - 11/01/20 0841      Core Components/Risk Factors/Patient Goals Review   Personal Goals Review Develop more efficient breathing techniques such as purse lipped breathing and diaphragmatic breathing and practicing self-pacing with activity.;Increase knowledge of respiratory medications and ability to use respiratory devices properly.;Improve shortness of breath with ADL's    Review Tested positive for Covid-19 4/20, has attended 3 exercise sessions, will be absent for 11 days after testing positive.    Expected Outcomes See admission goals.           ITP Comments:   Comments: ITP REVIEW Pt is making expected progress toward pulmonary rehab goals after completing 3 sessions. Recommend continued exercise, life style modification, education, and utilization of breathing techniques to increase stamina and strength and decrease shortness of breath with exertion.

## 2020-11-08 ENCOUNTER — Telehealth (HOSPITAL_COMMUNITY): Payer: Self-pay | Admitting: *Deleted

## 2020-11-08 ENCOUNTER — Other Ambulatory Visit (HOSPITAL_COMMUNITY): Payer: Self-pay

## 2020-11-08 ENCOUNTER — Encounter (HOSPITAL_COMMUNITY): Payer: Managed Care, Other (non HMO)

## 2020-11-13 ENCOUNTER — Encounter (HOSPITAL_COMMUNITY): Admission: RE | Admit: 2020-11-13 | Payer: Managed Care, Other (non HMO) | Source: Ambulatory Visit

## 2020-11-13 ENCOUNTER — Other Ambulatory Visit: Payer: Self-pay

## 2020-11-15 ENCOUNTER — Encounter (HOSPITAL_COMMUNITY): Payer: Managed Care, Other (non HMO)

## 2020-11-20 ENCOUNTER — Encounter (HOSPITAL_COMMUNITY): Payer: Managed Care, Other (non HMO)

## 2020-11-22 ENCOUNTER — Other Ambulatory Visit: Payer: Self-pay

## 2020-11-22 ENCOUNTER — Encounter (HOSPITAL_COMMUNITY)
Admission: RE | Admit: 2020-11-22 | Discharge: 2020-11-22 | Disposition: A | Discharge status: Home / Self Care | Payer: Managed Care, Other (non HMO) | Source: Ambulatory Visit | Attending: Emergency Medicine | Admitting: Emergency Medicine

## 2020-11-22 DIAGNOSIS — J8481 Lymphangioleiomyomatosis: Secondary | ICD-10-CM | POA: Diagnosis not present

## 2020-11-22 NOTE — Progress Notes (Signed)
Daily Session Note  Patient Details  Name: Brenda Williams MRN: [id number] Date of Birth: Mar 04, 1981 Referring Provider:   April Manson Pulmonary Rehab Walk Test from 10/12/2020 in [place]  Referring Provider Dr. Lamonte Sakai      Encounter Date: 11/22/2020  Check In:  Session Check In - 11/22/20 1450      Check-In   Supervising physician immediately available to respond to emergencies [facility] immediately available    Physician(s) Dr. Lonny Prude    Location [facility]    Staff Present Rosebud Poles, RN, Isaac Laud, MS, ACSM-CEP, Exercise Physiologist;Annedrea Rosezella [place], RN, MHA;Carlette Wilber Oliphant, RN, Milus Glazier, MS, ACSM-CEP, CCRP, Exercise Physiologist    Virtual Visit No    Medication changes reported     No    Fall or balance concerns reported    No    Tobacco Cessation No Change    Warm-up and Cool-down Performed as group-led instruction    Resistance Training Performed Yes    VAD Patient? No    PAD/SET Patient? No      Pain Assessment   Currently in Pain? No/denies    Pain Score 0-No pain    Multiple Pain Sites No           Capillary Blood Glucose: No results found for this or any previous visit (from the past 24 hour(s)).    Social History   Tobacco Use  Smoking Status Never Smoker  Smokeless Tobacco Never Used    Goals Met:  Proper associated with RPD/PD & O2 Sat Exercise tolerated well Strength training completed today  Goals Unmet:  Not Applicable  Comments: Service time is from 3559 to 58    Dr. Fransico Him is Medical Director for Cardiac Rehab at [facility].

## 2020-11-26 NOTE — Telephone Encounter (Signed)
Called and informed patient there is a $29 fee for the forms to be completed - pt okay with proceeding. Prepared forms will give to Dr. Lamonte Sakai on 11/27/2020 when he returns to the office -pr

## 2020-11-27 ENCOUNTER — Other Ambulatory Visit: Payer: Self-pay

## 2020-11-27 ENCOUNTER — Encounter (HOSPITAL_COMMUNITY)
Admission: RE | Admit: 2020-11-27 | Discharge: 2020-11-27 | Disposition: A | Discharge status: Home / Self Care | Payer: Managed Care, Other (non HMO) | Source: Ambulatory Visit | Attending: Emergency Medicine | Admitting: Emergency Medicine

## 2020-11-27 VITALS — Wt 241.2 lb

## 2020-11-27 DIAGNOSIS — J8481 Lymphangioleiomyomatosis: Secondary | ICD-10-CM | POA: Diagnosis not present

## 2020-11-27 NOTE — Telephone Encounter (Signed)
RB please advise. 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  Can you all set me up one of those handicap things.  I'm in rehab trying to get my breathing better but I'm still winded trying to get in there in the morning.  I had one when I was in college in 2019/2020 but  stopped asking for them.

## 2020-11-27 NOTE — Progress Notes (Signed)
Daily Session Note  Patient Details  Name: Brenda Williams MRN: [id number] Date of Birth: 1981/03/20 Referring Provider:   April Manson Pulmonary Rehab Walk Test from 10/12/2020 in [place]  Referring Provider Dr. Lamonte Sakai      Encounter Date: 11/27/2020  Check In:  Session Check In - 11/27/20 1455      Check-In   Supervising physician immediately available to respond to emergencies [facility] immediately available    Physician(s) Dr. Teryl Lucy    [facility]    Staff Present Rosebud Poles, RN, Isaac Laud, MS, ACSM-CEP, Exercise Physiologist;Lisa Ysidro Evert, RN;Olinty Celesta Aver, MS, ACSM CEP, Exercise Physiologist    Virtual Visit No    Medication changes reported     No    Fall or balance concerns reported    No    Tobacco Cessation No Change    Warm-up and Cool-down Performed as group-led instruction    Resistance Training Performed Yes    VAD Patient? No    PAD/SET Patient? No      Pain Assessment   Currently in Pain? No/denies    Multiple Pain Sites No           Capillary Blood Glucose: No results found for this or any previous visit (from the past 24 hour(s)).   Exercise Prescription Changes - 11/27/20 1500      Response to Exercise   Blood Pressure (Admit) 128/80    Blood Pressure (Exercise) 124/82    Blood Pressure (Exit) 112/60    Heart Rate (Admit) 83 bpm    Heart Rate (Exercise) 112 bpm    Heart Rate (Exit) 88 bpm    Oxygen Saturation (Admit) 95 %    Oxygen Saturation (Exercise) 90 %    Oxygen Saturation (Exit) 96 %    Rating of Perceived Exertion (Exercise) 12    Perceived Dyspnea (Exercise) 1    Duration Continue with 30 min of aerobic exercise without signs/symptoms of physical distress.    Intensity THRR unchanged      Progression   Progression Continue to progress workloads to maintain intensity without signs/symptoms of physical distress.      Resistance Training   Training  Prescription Yes    Weight red bands    Reps 10-15    Time 10 Minutes      Treadmill   MPH 2.2    Grade 0    Minutes 15      NuStep   Level 3    SPM 80    Minutes 15    METs 2.1           Social History   Tobacco Use  Smoking Status Never Smoker  Smokeless Tobacco Never Used    Goals Met:  Proper associated with RPD/PD & O2 Sat Exercise tolerated well Strength training completed today  Goals Unmet:  Not Applicable  Comments: Service time is from 3474 to 1422    Dr. Fransico Him is Medical Director for Cardiac Rehab at [facility].

## 2020-11-28 NOTE — Progress Notes (Signed)
[name] 40 y.o. female Nutrition Note  Diagnosis: LAM  Past Medical History:  Diagnosis Date  . Abnormal Pap smear 2006   leep  . Anxiety   . Arthritis    osteoarthritis  . Asthma   . Complication of anesthesia    2014 [facility]c [facility], HAD ??  SEIZURE/ SHAKING  . Cough   . Depression   . Fatigue   . GERD (gastroesophageal reflux disease)   . Herpes    never had outbreak. pos per blood, doesn't know which type  . Lactose intolerance   . Lymphangiomatosis   . Polycystic ovarian syndrome   . Prediabetes   . Sleep apnea    uses CPAP  . SVD (spontaneous vaginal delivery)    x 1  . Varicose vein of leg   . Vitamin D deficiency      Medications reviewed.   Current Outpatient Medications:  .  albuterol (VENTOLIN HFA) 108 (90 Base) MCG/ACT inhaler, Inhale 2 puffs into the lungs every 4 (four) hours as needed for wheezing or shortness of breath., Disp: 18 g, Rfl: 0 .  budesonide-formoterol (SYMBICORT) 80-4.5 MCG/ACT inhaler, Inhale 2 puffs into the lungs in the morning and at bedtime., Disp: 10.2 g, Rfl: 12 .  dextromethorphan-guaiFENesin (MUCINEX DM) 30-600 MG 12hr tablet, Take 1 tablet by mouth 2 (two) times daily. (Patient not taking: Reported on 10/12/2020), Disp: 20 tablet, Rfl: 0 .  fluticasone (FLONASE) 50 MCG/ACT nasal spray, Place 1-2 sprays into both nostrils daily., Disp: 16 g, Rfl: 0 .  ibuprofen (ADVIL) 600 MG tablet, Take 1 tablet (600 mg total) by mouth every 6 (six) hours as needed., Disp: 30 tablet, Rfl: 0 .  pantoprazole (PROTONIX) 40 MG tablet, Take 1 tablet (40 mg total) by mouth daily., Disp: 30 tablet, Rfl: 1 .  sirolimus (RAPAMUNE) 1 MG tablet, Take 1 tablet (1 mg total) by mouth 2 (two) times daily., Disp: 180 tablet, Rfl: 1  Current Facility-Administered Medications:  .  0.9 %  sodium chloride infusion, , Intravenous, PRN, Jacquelin Hawking, NP   Ht Readings from Last 1 Encounters:  10/31/20 5\' 7"  (1.702 m)     Wt Readings from Last 3  Encounters:  11/27/20 241 lb 2.9 oz (109.4 kg)  10/31/20 241 lb (109.3 kg)  10/30/20 241 lb 6.5 oz (109.5 kg)     Body mass index is 37.77 kg/m.   Social History   Tobacco Use  Smoking Status Never Smoker  Smokeless Tobacco Never Used      Nutrition Note  Spoke with pt. Nutrition Plan and Nutrition Survey goals reviewed with pt.   Pt has Pre-diabetes. Last A1c indicates blood glucose well-controlled. Pt has been monitoring this with her annual labs. She has made recent diet and lifestyle changes to lower CBG and for weight loss to breath better. Reviewed diet and weight history. She has weight cycled. She wants to continue working on lifestyle changes to avoid weight cycling/restrictive dieting. Her family is supportive. She is exercising as well but it has become more difficult as breathing has gotten worse.  She has been maintaining a healthy diet recently. She has done healthy weight and wellness and was on a 1500 calorie diet. She struggled to eat enough as she often got busy and skipped meals on accident. She wants to continue making healthy choices and increase water and eat consistently.   Pt expressed understanding of the information reviewed.   Nutrition Diagnosis  ? Obese  II =  35-39.9 related to excessive energy intake as evidenced by a Body mass index is 37.77 kg/m.  Nutrition Intervention ? Pt's individual nutrition plan reviewed with pt. ? Benefits of adopting healthy diet reviewed with Rate My Plate survey   ? Continue client-centered nutrition education by RD, as part of interdisciplinary care.  Goal(s) ? Pt to identify food quantities necessary to achieve weight loss of 6-24 lb at graduation from cardiac rehab.  ? Pt to build a healthy plate including vegetables, fruits, whole grains, and low-fat dairy products in a healthy meal plan.  Plan:   Will provide client-centered nutrition education as part of interdisciplinary care  Monitor and evaluate  progress toward nutrition goal with team.   Michaele Offer, MS, RDN, LDN

## 2020-11-28 NOTE — Telephone Encounter (Signed)
Yes I am ok completing this for her

## 2020-11-29 ENCOUNTER — Encounter (HOSPITAL_COMMUNITY)
Admission: RE | Admit: 2020-11-29 | Discharge: 2020-11-29 | Disposition: A | Discharge status: Home / Self Care | Payer: Managed Care, Other (non HMO) | Source: Ambulatory Visit | Attending: Emergency Medicine | Admitting: Emergency Medicine

## 2020-11-29 ENCOUNTER — Other Ambulatory Visit: Payer: Self-pay

## 2020-11-29 DIAGNOSIS — J8481 Lymphangioleiomyomatosis: Secondary | ICD-10-CM | POA: Diagnosis not present

## 2020-11-29 NOTE — Telephone Encounter (Signed)
Rec'd form back from Dr. Lamonte Sakai - sent email to Kathlee Nations to drop charge -pr

## 2020-11-29 NOTE — Progress Notes (Signed)
Daily Session Note  Patient Details  Name: Brenda Williams MRN: [id number] Date of Birth: 04/03/1981 Referring Provider:   April Manson Pulmonary Rehab Walk Test from 10/12/2020 in [place]  Referring Provider Dr. Lamonte Sakai      Encounter Date: 11/29/2020  Check In:  Session Check In - 11/29/20 1440      Check-In   Supervising physician immediately available to respond to emergencies [facility] immediately available    Physician(s) Dr. Maren Beach    [facility]    Staff Present Rosebud Poles, RN, Isaac Laud, MS, ACSM-CEP, Exercise Physiologist;Lumi Winslett Ysidro Evert, RN    Virtual Visit No    Medication changes reported     No    Fall or balance concerns reported    No    Tobacco Cessation No Change    Warm-up and Cool-down Performed as group-led instruction    Resistance Training Performed Yes    VAD Patient? No    PAD/SET Patient? No      Pain Assessment   Currently in Pain? No/denies           Capillary Blood Glucose: No results found for this or any previous visit (from the past 24 hour(s)).    Social History   Tobacco Use  Smoking Status Never Smoker  Smokeless Tobacco Never Used    Goals Met:  Exercise tolerated well No report of cardiac concerns or symptoms Strength training completed today  Goals Unmet:  Not Applicable  Comments: Service time is from 1315 to 1430    Dr. Fransico Him is Medical Director for Cardiac Rehab at [facility].

## 2020-11-29 NOTE — Progress Notes (Signed)
I have reviewed a Home Exercise Prescription with Brenda Williams . Berdene is currently exercising at home.  She states she walks a couple of times a week with her daughter and they also have a pool at home and she has recently started doing some water aerobics. I encouraged continued use of the pool and walking. The patient was advised to walk and do resistance band exercises 2-3 days a week for at least 30 minutes.  Brenda Williams and I discussed how to progress their exercise prescription.  The patient stated that their goals were to improve breathing and to be able to walk up a flight of stairs without shortness of breath. The patient stated that they understand the exercise prescription.  We reviewed exercise guidelines, target heart rate during exercise, RPE Scale, weather conditions, use of a rescue inhaler, endpoints for exercise, warmup and cool down.  Patient is encouraged to come to me with any questions. I will continue to follow up with the patient to assist them with progression and safety.    Rick Duff MS, ACSM CEP

## 2020-12-03 NOTE — Telephone Encounter (Signed)
Patrice please advise on pt email. Thanks!

## 2020-12-04 ENCOUNTER — Telehealth (HOSPITAL_COMMUNITY): Payer: Self-pay | Admitting: Family Medicine

## 2020-12-04 ENCOUNTER — Encounter (HOSPITAL_COMMUNITY)
Admission: RE | Admit: 2020-12-04 | Discharge: 2020-12-04 | Disposition: A | Discharge status: Home / Self Care | Payer: Managed Care, Other (non HMO) | Source: Ambulatory Visit | Attending: Emergency Medicine | Admitting: Emergency Medicine

## 2020-12-04 NOTE — Telephone Encounter (Signed)
Kathlee Nations still hasn't dropped the charge - I understand the urgency so I have faxed to forms to [employer] at [phone] and will contact pt for payment once the charge has been dropped -pr

## 2020-12-04 NOTE — Progress Notes (Signed)
Pulmonary Individual Treatment Plan  Patient Details  Name: Brenda Williams MRN: [id number] Date of Birth: 1981-02-02 Referring Provider:   [name] Pulmonary Rehab Walk Test from 10/12/2020 in [facility] CARDI[facility]  Referring Provider Dr. Delton Coombes      Initial Encounter Date:  Flowsheet Row Pulmonary Rehab Walk Test from 10/12/2020 in [facility]ARD[facility] REHAB  Date 10/12/20      Visit Diagnosis: Lymphangioleiomyomatosis (HCC)  Patient's Home Medications on Admission:   Current Outpatient Medications:  .  albuterol (VENTOLIN HFA) 108 (90 Base) MCG/ACT inhaler, Inhale 2 puffs into the lungs every 4 (four) hours as needed for wheezing or shortness of breath., Disp: 18 g, Rfl: 0 .  budesonide-formoterol (SYMBICORT) 80-4.5 MCG/ACT inhaler, Inhale 2 puffs into the lungs in the morning and at bedtime., Disp: 10.2 g, Rfl: 12 .  dextromethorphan-guaiFENesin (MUCINEX DM) 30-600 MG 12hr tablet, Take 1 tablet by mouth 2 (two) times daily. (Patient not taking: Reported on 10/12/2020), Disp: 20 tablet, Rfl: 0 .  fluticasone (FLONASE) 50 MCG/ACT nasal spray, Place 1-2 sprays into both nostrils daily., Disp: 16 g, Rfl: 0 .  ibuprofen (ADVIL) 600 MG tablet, Take 1 tablet (600 mg total) by mouth every 6 (six) hours as needed., Disp: 30 tablet, Rfl: 0 .  pantoprazole (PROTONIX) 40 MG tablet, Take 1 tablet (40 mg total) by mouth daily., Disp: 30 tablet, Rfl: 1 .  sirolimus (RAPAMUNE) 1 MG tablet, Take 1 tablet (1 mg total) by mouth 2 (two) times daily., Disp: 180 tablet, Rfl: 1  Current Facility-Administered Medications:  .  0.9 %  sodium chloride infusion, , Intravenous, PRN, Lawerance Bach Renda Rolls, NP  Past Medical History: Past Medical History:  Diagnosis Date  . Abnormal Pap smear 2006   leep  . Anxiety   . Arthritis    osteoarthritis  . Asthma   . Complication of anesthesia    2014 d[facility]c [facility], HAD ??  SEIZURE/ SHAKING  . Cough   . Depression   .  Fatigue   . GERD (gastroesophageal reflux disease)   . Herpes    never had outbreak. pos per blood, doesn't know which type  . Lactose intolerance   . Lymphangiomatosis   . Polycystic ovarian syndrome   . Prediabetes   . Sleep apnea    uses CPAP  . SVD (spontaneous vaginal delivery)    x 1  . Varicose vein of leg   . Vitamin D deficiency     Tobacco Use: Social History   Tobacco Use  Smoking Status Never Smoker  Smokeless Tobacco Never Used    Labs: Recent Review Flowsheet Data    Labs for ITP Cardiac and Pulmonary Rehab Latest Ref Rng & Units 09/18/2015 12/26/2015 12/29/2015 01/28/2017 06/22/2019   Cholestrol 100 - 199 mg/dL 548(H) - - 184(K) -   LDLCALC 0 - 99 mg/dL 048(S) - - 483(G) -   LDLDIRECT mg/dL - - - - -   HDL >88 mg/dL 21.77 - - 53 -   Trlycerides 0 - 149 mg/dL 29.1 - - 99 -   Hemoglobin A1c 4.6 - 6.5 % 6.0 - - 6.1(H) 6.1   PHART 7.350 - 7.450 - 7.426 7.361 - -   PCO2ART 35.0 - 45.0 mmHg - 38.0 45.5(H) - -   HCO3 20.0 - 24.0 mEq/L - 24.5(H) 25.8(H) - -   TCO2 0 - 100 mmol/L - 25.7 27 - -   O2SAT % - 97.0 96.0 - -  Capillary Blood Glucose: Lab Results  Component Value Date   GLUCAP 106 (H) 11/18/2017   GLUCAP 116 (H) 12/31/2015   GLUCAP 84 12/30/2015   GLUCAP 124 (H) 12/30/2015   GLUCAP 136 (H) 12/29/2015     Pulmonary Assessment Scores:  Pulmonary Assessment Scores    Row Name 10/12/20 1218         ADL UCSD   ADL Phase Entry     SOB Score total 44           CAT Score   CAT Score 18           mMRC Score   mMRC Score 2           UCSD: Self-administered rating of dyspnea associated with activities of daily living (ADLs) 6-point scale (0 = "not at all" to 5 = "maximal or unable to do because of breathlessness")  Scoring Scores range from 0 to 120.  Minimally important difference is 5 units  CAT: CAT can identify the health impairment of COPD patients and is better correlated with disease progression.  CAT has a scoring range of  zero to 40. The CAT score is classified into four groups of low (less than 10), medium (10 - 20), high (21-30) and very high (31-40) based on the impact level of disease on health status. A CAT score over 10 suggests significant symptoms.  A worsening CAT score could be explained by an exacerbation, poor medication adherence, poor inhaler technique, or progression of COPD or comorbid conditions.  CAT MCID is 2 points  mMRC: mMRC (Modified Medical Research Council) Dyspnea Scale is used to assess the degree of baseline functional disability in patients of respiratory disease due to dyspnea. No minimal important difference is established. A decrease in score of 1 point or greater is considered a positive change.   Pulmonary Function Assessment:  Pulmonary Function Assessment - 10/12/20 0936      Breath   Shortness of Breath Yes;Limiting activity           Exercise Target Goals: Exercise Program Goal: Individual exercise prescription set using results from initial 6 min walk test and THRR while considering  patient's activity barriers and safety.   Exercise Prescription Goal: Initial exercise prescription builds to 30-45 minutes a day of aerobic activity, 2-3 days per week.  Home exercise guidelines will be given to patient during program as part of exercise prescription that the participant will acknowledge.  Activity Barriers & Risk Stratification:  Activity Barriers & Cardiac Risk Stratification - 10/12/20 0934      Activity Barriers & Cardiac Risk Stratification   Activity Barriers Arthritis;Deconditioning;Shortness of Breath;Joint Problems           6 Minute Walk:  6 Minute Walk    Row Name 10/12/20 1207         6 Minute Walk   Phase Initial     Distance 1294 feet     Walk Time 6 minutes     # of Rest Breaks 0     MPH 2.45     METS 4.07     RPE 11     Perceived Dyspnea  2     VO2 Peak 14.26     Symptoms Yes (comment)     Comments pt complained of chest tightness  but is aware this is due to lung issue     Resting HR 79 bpm     Resting BP 130/82     Resting Oxygen Saturation  94 %     Exercise Oxygen Saturation  during 6 min walk 87 %     Max Ex. HR 112 bpm     Max Ex. BP 136/90     2 Minute Post BP 132/86           Interval HR   1 Minute HR 98     2 Minute HR 101     3 Minute HR 96     4 Minute HR 105     5 Minute HR 112     6 Minute HR 107     2 Minute Post HR 79     Interval Heart Rate? Yes           Interval Oxygen   Interval Oxygen? Yes     Baseline Oxygen Saturation % 94 %     1 Minute Oxygen Saturation % 92 %     1 Minute Liters of Oxygen 0 L     2 Minute Oxygen Saturation % 89 %     2 Minute Liters of Oxygen 0 L     3 Minute Oxygen Saturation % 87 %     3 Minute Liters of Oxygen 0 L     4 Minute Oxygen Saturation % 87 %     4 Minute Liters of Oxygen 0 L     5 Minute Oxygen Saturation % 87 %     5 Minute Liters of Oxygen 0 L     6 Minute Oxygen Saturation % 87 %     6 Minute Liters of Oxygen 0 L     2 Minute Post Oxygen Saturation % 96 %     2 Minute Post Liters of Oxygen 0 L            Oxygen Initial Assessment:  Oxygen Initial Assessment - 10/12/20 0936      Home Oxygen   Home Oxygen Device None    Sleep Oxygen Prescription CPAP    Home Exercise Oxygen Prescription None    Home Resting Oxygen Prescription None    Compliance with Home Oxygen Use Yes      Initial 6 min Walk   Oxygen Used None      Program Oxygen Prescription   Program Oxygen Prescription None      Intervention   Short Term Goals To learn and understand importance of monitoring SPO2 with pulse oximeter and demonstrate accurate use of the pulse oximeter.;To learn and understand importance of maintaining oxygen saturations>88%;To learn and demonstrate proper pursed lip breathing techniques or other breathing techniques.;To learn and demonstrate proper use of respiratory medications    Long  Term Goals Verbalizes importance of monitoring SPO2  with pulse oximeter and return demonstration;Maintenance of O2 saturations>88%;Exhibits proper breathing techniques, such as pursed lip breathing or other method taught during program session;Compliance with respiratory medication;Demonstrates proper use of MDI's           Oxygen Re-Evaluation:  Oxygen Re-Evaluation    Row Name 11/05/20 1635 12/04/20 0727           Program Oxygen Prescription   Program Oxygen Prescription None None             Home Oxygen   Home Oxygen Device None None      Sleep Oxygen Prescription CPAP CPAP      Home Exercise Oxygen Prescription None None      Home Resting Oxygen Prescription None None      Compliance with  Home Oxygen Use Yes Yes             Goals/Expected Outcomes   Short Term Goals To learn and understand importance of monitoring SPO2 with pulse oximeter and demonstrate accurate use of the pulse oximeter.;To learn and understand importance of maintaining oxygen saturations>88%;To learn and demonstrate proper pursed lip breathing techniques or other breathing techniques.;To learn and demonstrate proper use of respiratory medications To learn and understand importance of monitoring SPO2 with pulse oximeter and demonstrate accurate use of the pulse oximeter.;To learn and understand importance of maintaining oxygen saturations>88%;To learn and demonstrate proper pursed lip breathing techniques or other breathing techniques.;To learn and demonstrate proper use of respiratory medications      Long  Term Goals Verbalizes importance of monitoring SPO2 with pulse oximeter and return demonstration;Maintenance of O2 saturations>88%;Exhibits proper breathing techniques, such as pursed lip breathing or other method taught during program session;Compliance with respiratory medication;Demonstrates proper use of MDI's Verbalizes importance of monitoring SPO2 with pulse oximeter and return demonstration;Maintenance of O2 saturations>88%;Exhibits proper breathing  techniques, such as pursed lip breathing or other method taught during program session;Compliance with respiratory medication;Demonstrates proper use of MDI's      Goals/Expected Outcomes compliance and understanding of oxygen saturation and pursed lip breathing. compliance and understanding of oxygen saturation and pursed lip breathing.             Oxygen Discharge (Final Oxygen Re-Evaluation):  Oxygen Re-Evaluation - 12/04/20 0727      Program Oxygen Prescription   Program Oxygen Prescription None      Home Oxygen   Home Oxygen Device None    Sleep Oxygen Prescription CPAP    Home Exercise Oxygen Prescription None    Home Resting Oxygen Prescription None    Compliance with Home Oxygen Use Yes      Goals/Expected Outcomes   Short Term Goals To learn and understand importance of monitoring SPO2 with pulse oximeter and demonstrate accurate use of the pulse oximeter.;To learn and understand importance of maintaining oxygen saturations>88%;To learn and demonstrate proper pursed lip breathing techniques or other breathing techniques.;To learn and demonstrate proper use of respiratory medications    Long  Term Goals Verbalizes importance of monitoring SPO2 with pulse oximeter and return demonstration;Maintenance of O2 saturations>88%;Exhibits proper breathing techniques, such as pursed lip breathing or other method taught during program session;Compliance with respiratory medication;Demonstrates proper use of MDI's    Goals/Expected Outcomes compliance and understanding of oxygen saturation and pursed lip breathing.           Initial Exercise Prescription:  Initial Exercise Prescription - 10/12/20 1200      Date of Initial Exercise RX and Referring Provider   Date 10/12/20    Referring Provider Dr. Lamonte Sakai    Expected Discharge Date 12/13/20      Treadmill   MPH 2.2    Grade 0    Minutes 15      NuStep   Level 2    SPM 80    Minutes 15      Prescription Details   Frequency  (times per week) 2    Duration Progress to 30 minutes of continuous aerobic without signs/symptoms of physical distress      Intensity   THRR 40-80% of Max Heartrate 72-143    Ratings of Perceived Exertion 11-13    Perceived Dyspnea 0-4      Progression   Progression Continue to progress workloads to maintain intensity without signs/symptoms of physical distress.  Resistance Training   Training Prescription Yes    Weight Red bands    Reps 10-15           Perform Capillary Blood Glucose checks as needed.  Exercise Prescription Changes:  Exercise Prescription Changes    Row Name 10/16/20 1500 10/30/20 1500 11/27/20 1500 11/29/20 1500       Response to Exercise   Blood Pressure (Admit) 124/86 118/82 128/80 --    Blood Pressure (Exercise) 122/84 120/76 124/82 --    Blood Pressure (Exit) 110/80 122/88 112/60 --    Heart Rate (Admit) 91 bpm 68 bpm 83 bpm --    Heart Rate (Exercise) 115 bpm 102 bpm 112 bpm --    Heart Rate (Exit) 91 bpm 78 bpm 88 bpm --    Oxygen Saturation (Admit) 95 % 98 % 95 % --    Oxygen Saturation (Exercise) 89 % 89 % 90 % --    Oxygen Saturation (Exit) 95 % 93 % 96 % --    Rating of Perceived Exertion (Exercise) _0 --    Perceived Dyspnea (Exercise) _1 --    Duration Progress to 30 minutes of  aerobic without signs/symptoms of physical distress Continue with 30 min of aerobic exercise without signs/symptoms of physical distress. Continue with 30 min of aerobic exercise without signs/symptoms of physical distress. --    Intensity Other (comment)  40-80% of HRR THRR unchanged THRR unchanged --         Progression   Progression Continue to progress workloads to maintain intensity without signs/symptoms of physical distress. Continue to progress workloads to maintain intensity without signs/symptoms of physical distress. Continue to progress workloads to maintain intensity without signs/symptoms of physical distress. --         Cytogeneticist Prescription Yes Yes Yes --    Weight red band red bands red bands --    Reps 10-15 10-15 10-15 --    Time 10 Minutes 10 Minutes 10 Minutes --         Treadmill   MPH 2.2 2.2 2.2 --    Grade 0 0 0 --    Minutes _2 --         NuStep   Level _3 --    SPM 80 80 80 --    Minutes 1._4 --    METs -- 2.2 2.1 --         Home Exercise Plan   Plans to continue exercise at -- -- -- Home (comment)  Walking and resistance training with bands    Frequency -- -- -- Add 3 additional days to program exercise sessions.    Initial Home Exercises Provided -- -- -- 11/29/20           Exercise Comments:  Exercise Comments    Row Name 11/29/20 1500           Exercise Comments Completed home exercise with pt. Pt has a pool at home and has recently started doing water aerobics. She also walks a couple of times a week. Encouraged doing the resistance band exercises at home at least twice a week. Pt was receptive. Will continue to follow up on home exercise prescription.              Exercise Goals and Review:  Exercise Goals    Row Name 10/12/20 [phone]  Exercise Goals   Increase Physical Activity Yes       Intervention Provide advice, education, support and counseling about physical activity/exercise needs.;Develop an individualized exercise prescription for aerobic and resistive training based on initial evaluation findings, risk stratification, comorbidities and participant's personal goals.       Expected Outcomes Short Term: Attend rehab on a regular basis to increase amount of physical activity.;Long Term: Add in home exercise to make exercise part of routine and to increase amount of physical activity.;Long Term: Exercising regularly at least 3-5 days a week.       Increase Strength and Stamina Yes       Intervention Provide advice, education, support and counseling about physical activity/exercise needs.;Develop an individualized exercise  prescription for aerobic and resistive training based on initial evaluation findings, risk stratification, comorbidities and participant's personal goals.       Expected Outcomes Short Term: Increase workloads from initial exercise prescription for resistance, speed, and METs.;Short Term: Perform resistance training exercises routinely during rehab and add in resistance training at home;Long Term: Improve cardiorespiratory fitness, muscular endurance and strength as measured by increased METs and functional capacity (6MWT)       Able to understand and use rate of perceived exertion (RPE) scale Yes       Intervention Provide education and explanation on how to use RPE scale       Expected Outcomes Short Term: Able to use RPE daily in rehab to express subjective intensity level;Long Term:  Able to use RPE to guide intensity level when exercising independently       Able to understand and use Dyspnea scale Yes       Intervention Provide education and explanation on how to use Dyspnea scale       Expected Outcomes Short Term: Able to use Dyspnea scale daily in rehab to express subjective sense of shortness of breath during exertion;Long Term: Able to use Dyspnea scale to guide intensity level when exercising independently       Knowledge and understanding of Target Heart Rate Range (THRR) Yes       Intervention Provide education and explanation of THRR including how the numbers were predicted and where they are located for reference       Expected Outcomes Short Term: Able to state/look up THRR;Long Term: Able to use THRR to govern intensity when exercising independently;Short Term: Able to use daily as guideline for intensity in rehab       Understanding of Exercise Prescription Yes       Intervention Provide education, explanation, and written materials on patient's individual exercise prescription       Expected Outcomes Short Term: Able to explain program exercise prescription;Long Term: Able to explain  home exercise prescription to exercise independently              Exercise Goals Re-Evaluation :  Exercise Goals Re-Evaluation    Row Name 11/05/20 1632 12/04/20 0722           Exercise Goal Re-Evaluation   Exercise Goals Review Increase Physical Activity;Increase Strength and Stamina;Able to understand and use rate of perceived exertion (RPE) scale;Able to understand and use Dyspnea scale;Knowledge and understanding of Target Heart Rate Range (THRR);Understanding of Exercise Prescription Increase Physical Activity;Increase Strength and Stamina;Able to understand and use rate of perceived exertion (RPE) scale;Able to understand and use Dyspnea scale;Knowledge and understanding of Target Heart Rate Range (THRR);Understanding of Exercise Prescription      Comments Pt has completed 3 exercise  sessions and has tolerated well so far with no complaints or concerns. In the short time she has been in the program she has already progressed with MET level increases. She is exercising at 2.2 METS on the Nustep and 2.5 METS on the treadmill. Will continue to monitor and progress as she is able. Bunnie has completed 6 exercise sessions. She was out for a couple of weeks due to having COVID. She has been tolerating exercise well since returning back from having COVID. We will be discussing home exercise with her soon so she can add days of exercise at home if she is not already. She is exercising at 2.1 METS on the Nustep and 3.0 METS on the treadmill. Will continue to monitor and progress as she is able.      Expected Outcomes Through exercise at rehab and home, the patient will decrease shortness of breath with daily activities and feel confident in carying out an exercise regimn at home. Through exercise at rehab and home, the patient will decrease shortness of breath with daily activities and feel confident in carying out an exercise regimn at home.             Discharge Exercise Prescription (Final  Exercise Prescription Changes):  Exercise Prescription Changes - 11/29/20 1500      Home Exercise Plan   Plans to continue exercise at Home (comment)   Walking and resistance training with bands   Frequency Add 3 additional days to program exercise sessions.    Initial Home Exercises Provided 11/29/20           Nutrition:  Target Goals: Understanding of nutrition guidelines, daily intake of sodium '1500mg'$ , cholesterol '200mg'$ , calories 30% from fat and 7% or less from saturated fats, daily to have 5 or more servings of fruits and vegetables.  Biometrics:  Pre Biometrics - 10/12/20 1205      Pre Biometrics   Grip Strength 31 kg   Left Hand           Nutrition Therapy Plan and Nutrition Goals:  Nutrition Therapy & Goals - 11/28/20 1444      Nutrition Therapy   Diet Generally Healthful      Personal Nutrition Goals   Nutrition Goal Pt to identify food quantities necessary to achieve weight loss of 6-24 lb at graduation from cardiac rehab.    Personal Goal #2 Pt to build a healthy plate including vegetables, fruits, whole grains, and low-fat dairy products in a healthy meal plan      Intervention Plan   Intervention Prescribe, educate and counsel regarding individualized specific dietary modifications aiming towards targeted core components such as weight, hypertension, lipid management, diabetes, heart failure and other comorbidities.    Expected Outcomes Short Term Goal: Understand basic principles of dietary content, such as calories, fat, sodium, cholesterol and nutrients.;Long Term Goal: Adherence to prescribed nutrition plan.           Nutrition Assessments:  MEDIFICTS Score Key:  ?70 Need to make dietary changes   40-70 Heart Healthy Diet  ? 40 Therapeutic Level Cholesterol Diet   Picture Your Plate Scores:  <70 Unhealthy dietary pattern with much room for improvement.  41-50 Dietary pattern unlikely to meet recommendations for good health and room for  improvement.  51-60 More healthful dietary pattern, with some room for improvement.   >60 Healthy dietary pattern, although there may be some specific behaviors that could be improved.    Nutrition Goals Re-Evaluation:  Nutrition Goals Re-Evaluation  [place] Name 11/28/20 1445 12/03/20 1400           Goals   Current Weight 241 lb (109.3 kg) 241 lb 2.9 oz (109.4 kg)      Nutrition Goal Pt to identify food quantities necessary to achieve weight loss of 6-24 lb at graduation from cardiac rehab. Pt to identify food quantities necessary to achieve weight loss of 6-24 lb at graduation from cardiac rehab.             Personal Goal #2 Re-Evaluation   Personal Goal #2 Pt to build a healthy plate including vegetables, fruits, whole grains, and low-fat dairy products in a healthy meal plan Pt to build a healthy plate including vegetables, fruits, whole grains, and low-fat dairy products in a healthy meal plan             Nutrition Goals Discharge (Final Nutrition Goals Re-Evaluation):  Nutrition Goals Re-Evaluation - 12/03/20 1400      Goals   Current Weight 241 lb 2.9 oz (109.4 kg)    Nutrition Goal Pt to identify food quantities necessary to achieve weight loss of 6-24 lb at graduation from cardiac rehab.      Personal Goal #2 Re-Evaluation   Personal Goal #2 Pt to build a healthy plate including vegetables, fruits, whole grains, and low-fat dairy products in a healthy meal plan           Psychosocial: Target Goals: Acknowledge presence or absence of significant depression and/or stress, maximize coping skills, provide positive support system. Participant is able to verbalize types and ability to use techniques and skills needed for reducing stress and depression.  Initial Review & Psychosocial Screening:  Initial Psych Review & Screening - 10/12/20 0937      Initial Review   Current issues with History of Depression;Current Stress Concerns   Diagnosed with depression when she was  diagnosed with lung disease   Source of Stress Concerns Chronic Illness;Occupation    Comments Job is stressfull. And she is not able to to do things with her kids and that upsets her.      Family Dynamics   Good Support System? Yes   Friends, family, and Husband     Barriers   Psychosocial barriers to participate in program The patient should benefit from training in stress management and relaxation.      Screening Interventions   Interventions Encouraged to exercise    Expected Outcomes Long Term goal: The participant improves quality of Life and PHQ9 Scores as seen by post scores and/or verbalization of changes;Long Term Goal: Stressors or current issues are controlled or eliminated.           Quality of Life Scores:  Scores of 19 and below usually indicate a poorer quality of life in these areas.  A difference of  2-3 points is a clinically meaningful difference.  A difference of 2-3 points in the total score of the Quality of Life Index has been associated with significant improvement in overall quality of life, self-image, physical symptoms, and general health in studies assessing change in quality of life.  PHQ-9: Recent Review Flowsheet Data    Depression screen [facility] 2/9 10/12/2020 01/28/2017 04/28/2016 09/17/2015   Decreased Interest 0 3 0 0   Down, Depressed, Hopeless 0 3 0 0   PHQ - 2 Score 0 6 0 0   Altered sleeping 0 1 - -   Tired, decreased energy 2 - - -   Change in appetite 0  3 - -   Feeling bad or failure about yourself  0 2 - -   Trouble concentrating 0 2 - -   Moving slowly or fidgety/restless 0 2 - -   Suicidal thoughts 0 1 - -   PHQ-9 Score - 17 - -   Difficult doing work/chores Not difficult at all - - -     Interpretation of Total Score  Total Score Depression Severity:  1-4 = Minimal depression, 5-9 = Mild depression, 10-14 = Moderate depression, 15-19 = Moderately severe depression, 20-27 = Severe depression   Psychosocial Evaluation and Intervention:   Psychosocial Evaluation - 10/12/20 1039      Psychosocial Evaluation & Interventions   Expected Outcomes Decrease shortness of breath with daily activities and improve quality of life.           Psychosocial Re-Evaluation:  Psychosocial Re-Evaluation    Row Name 11/01/20 [phone] 11/01/20 0838 11/26/20 1409         Psychosocial Re-Evaluation   Current issues with Current Depression;History of Depression History of Depression;Current Depression;Current Stress Concerns Current Depression;History of Depression;Current Stress Concerns     Comments -- Fortunata just started pulmonary rehab, has attended 3 exercise sessions, tested positive for Covid 10/31/2020, will be out for 11 days. Has been absent d/t testing positive for covid, has returned to pulmonary rehab and her depression is stable at this time.     Expected Outcomes -- For Ethelda to return to pulmonary rehab and continue to evaluate psychosocial concerns every 30 days. For Shanita to participate regularly in pulmonary rehab and handle her stress in healthy ways.     Interventions -- Stress management education;Encouraged to attend Pulmonary Rehabilitation for the exercise Stress management education;Relaxation education;Encouraged to attend Pulmonary Rehabilitation for the exercise     Continue Psychosocial Services  -- Follow up required by staff Follow up required by staff           Initial Review   Source of Stress Concerns -- Chronic Illness;Unable to participate in former interests or hobbies;Unable to perform yard/household activities Chronic Illness;Unable to participate in former interests or hobbies;Unable to perform yard/household activities            Psychosocial Discharge (Final Psychosocial Re-Evaluation):  Psychosocial Re-Evaluation - 11/26/20 1409      Psychosocial Re-Evaluation   Current issues with Current Depression;History of Depression;Current Stress Concerns    Comments Has been absent d/t testing positive  for covid, has returned to pulmonary rehab and her depression is stable at this time.    Expected Outcomes For Kiyani to participate regularly in pulmonary rehab and handle her stress in healthy ways.    Interventions Stress management education;Relaxation education;Encouraged to attend Pulmonary Rehabilitation for the exercise    Continue Psychosocial Services  Follow up required by staff      Initial Review   Source of Stress Concerns Chronic Illness;Unable to participate in former interests or hobbies;Unable to perform yard/household activities           Education: Education Goals: Education classes will be provided on a weekly basis, covering required topics. Participant will state understanding/return demonstration of topics presented.  Learning Barriers/Preferences:  Learning Barriers/Preferences - 10/12/20 0944      Learning Barriers/Preferences   Learning Barriers None    Learning Preferences Skilled Demonstration;Individual Instruction;Group Instruction           Education Topics: Risk Factor Reduction:  -Group instruction that is supported by a PowerPoint presentation. Instructor discusses the definition  of a risk factor, different risk factors for pulmonary disease, and how the heart and lungs work together.     Nutrition for Pulmonary Patient:  -Group instruction provided by PowerPoint slides, verbal discussion, and written materials to support subject matter. The instructor gives an explanation and review of healthy diet recommendations, which includes a discussion on weight management, recommendations for fruit and vegetable consumption, as well as protein, fluid, caffeine, fiber, sodium, sugar, and alcohol. Tips for eating when patients are short of breath are discussed.   Pursed Lip Breathing:  -Group instruction that is supported by demonstration and informational handouts. Instructor discusses the benefits of pursed lip and diaphragmatic breathing and detailed  demonstration on how to preform both.     Oxygen Safety:  -Group instruction provided by PowerPoint, verbal discussion, and written material to support subject matter. There is an overview of "What is Oxygen" and "Why do we need it".  Instructor also reviews how to create a safe environment for oxygen use, the importance of using oxygen as prescribed, and the risks of noncompliance. There is a brief discussion on traveling with oxygen and resources the patient may utilize.   Oxygen Equipment:  -Group instruction provided by [facility] Staff utilizing handouts, written materials, and equipment demonstrations.   Signs and Symptoms:  -Group instruction provided by written material and verbal discussion to support subject matter. Warning signs and symptoms of infection, stroke, and heart attack are reviewed and when to call the physician/911 reinforced. Tips for preventing the spread of infection discussed.   Advanced Directives:  -Group instruction provided by verbal instruction and written material to support subject matter. Instructor reviews Advanced Directive laws and proper instruction for filling out document.   Pulmonary Video:  -Group video education that reviews the importance of medication and oxygen compliance, exercise, good nutrition, pulmonary hygiene, and pursed lip and diaphragmatic breathing for the pulmonary patient.   Exercise for the Pulmonary Patient:  -Group instruction that is supported by a PowerPoint presentation. Instructor discusses benefits of exercise, core components of exercise, frequency, duration, and intensity of an exercise routine, importance of utilizing pulse oximetry during exercise, safety while exercising, and options of places to exercise outside of rehab.     Pulmonary Medications:  -Verbally interactive group education provided by instructor with focus on inhaled medications and proper administration.   Anatomy and Physiology of the Respiratory  System and Intimacy:  -Group instruction provided by PowerPoint, verbal discussion, and written material to support subject matter. Instructor reviews respiratory cycle and anatomical components of the respiratory system and their functions. Instructor also reviews differences in obstructive and restrictive respiratory diseases with examples of each. Intimacy, Sex, and Sexuality differences are reviewed with a discussion on how relationships can change when diagnosed with pulmonary disease. Common sexual concerns are reviewed.   MD DAY -A group question and answer session with a medical doctor that allows participants to ask questions that relate to their pulmonary disease state.   OTHER EDUCATION -Group or individual verbal, written, or video instructions that support the educational goals of the pulmonary rehab program. [place] from 11/29/2020 in [place]  Date 11/29/20  [facility] [place]  Educator handout  [Reading Labels]      Holiday Eating Survival Tips:  -Group instruction provided by PowerPoint slides, verbal discussion, and written materials to support subject matter. The instructor gives patients tips, tricks, and techniques to help them not only survive but enjoy the holidays despite  the onslaught of food that accompanies the holidays.   Knowledge Questionnaire Score:  Knowledge Questionnaire Score - 10/12/20 1216      Knowledge Questionnaire Score   Pre Score 16/18           Core Components/Risk Factors/Patient Goals at Admission:  Personal Goals and Risk Factors at Admission - 10/12/20 0944      Core Components/Risk Factors/Patient Goals on Admission    Weight Management Yes;Obesity    Goal Weight: Short Term 200 lb (90.7 kg)    Goal Weight: Long Term 175 lb (79.4 kg)    Improve shortness of breath with ADL's Yes    Intervention Provide education, individualized exercise plan and daily activity  instruction to help decrease symptoms of SOB with activities of daily living.    Expected Outcomes Short Term: Improve cardiorespiratory fitness to achieve a reduction of symptoms when performing ADLs;Long Term: Be able to perform more ADLs without symptoms or delay the onset of symptoms           Core Components/Risk Factors/Patient Goals Review:   Goals and Risk Factor Review    Row Name 11/01/20 0841 11/26/20 1411           Core Components/Risk Factors/Patient Goals Review   Personal Goals Review Develop more efficient breathing techniques such as purse lipped breathing and diaphragmatic breathing and practicing self-pacing with activity.;Increase knowledge of respiratory medications and ability to use respiratory devices properly.;Improve shortness of breath with ADL's Develop more efficient breathing techniques such as purse lipped breathing and diaphragmatic breathing and practicing self-pacing with activity.;Increase knowledge of respiratory medications and ability to use respiratory devices properly.;Improve shortness of breath with ADL's      Review Tested positive for Covid-19 4/20, has attended 3 exercise sessions, will be absent for 11 days after testing positive. Due to several absences she has progressed slowly after testing positive for covid.  Continue to progress as tolerated on equipment.      Expected Outcomes See admission goals. See admission goals.             Core Components/Risk Factors/Patient Goals at Discharge (Final Review):   Goals and Risk Factor Review - 11/26/20 1411      Core Components/Risk Factors/Patient Goals Review   Personal Goals Review Develop more efficient breathing techniques such as purse lipped breathing and diaphragmatic breathing and practicing self-pacing with activity.;Increase knowledge of respiratory medications and ability to use respiratory devices properly.;Improve shortness of breath with ADL's    Review Due to several absences she  has progressed slowly after testing positive for covid.  Continue to progress as tolerated on equipment.    Expected Outcomes See admission goals.           ITP Comments:   Comments:  Deniesha has completed 6 exercise session in Pulmonary rehab. Pt maintains good attendance and consistent home exercise. Pulmonary rehab staff will continue to monitor and reassess progress toward goals during her participation in Pulmonary Rehab. Cherre Huger, BSN Cardiac and Training and development officer

## 2020-12-05 ENCOUNTER — Telehealth (HOSPITAL_COMMUNITY): Payer: Self-pay | Admitting: Family Medicine

## 2020-12-05 DIAGNOSIS — Z0289 Encounter for other administrative examinations: Secondary | ICD-10-CM

## 2020-12-05 NOTE — Telephone Encounter (Signed)
Called and spoke to patient - she will pay $29 fee via mychart -pr

## 2020-12-06 ENCOUNTER — Encounter (HOSPITAL_COMMUNITY): Payer: Managed Care, Other (non HMO)

## 2020-12-07 ENCOUNTER — Encounter: Payer: Self-pay | Admitting: Family Medicine

## 2020-12-07 ENCOUNTER — Other Ambulatory Visit: Payer: Self-pay

## 2020-12-07 ENCOUNTER — Telehealth: Payer: Self-pay | Admitting: Family Medicine

## 2020-12-07 ENCOUNTER — Ambulatory Visit (INDEPENDENT_AMBULATORY_CARE_PROVIDER_SITE_OTHER): Payer: Managed Care, Other (non HMO) | Admitting: Family Medicine

## 2020-12-07 VITALS — BP 118/82 | HR 77 | Temp 98.3°F | Ht 67.0 in | Wt 242.0 lb

## 2020-12-07 DIAGNOSIS — Z1159 Encounter for screening for other viral diseases: Secondary | ICD-10-CM | POA: Diagnosis not present

## 2020-12-07 DIAGNOSIS — F332 Major depressive disorder, recurrent severe without psychotic features: Secondary | ICD-10-CM

## 2020-12-07 DIAGNOSIS — E669 Obesity, unspecified: Secondary | ICD-10-CM

## 2020-12-07 DIAGNOSIS — J439 Emphysema, unspecified: Secondary | ICD-10-CM | POA: Diagnosis not present

## 2020-12-07 DIAGNOSIS — Z0000 Encounter for general adult medical examination without abnormal findings: Secondary | ICD-10-CM

## 2020-12-07 LAB — HEMOGLOBIN A1C: Hgb A1c MFr Bld: 6.5 % (ref 4.6–6.5)

## 2020-12-07 LAB — COMPREHENSIVE METABOLIC PANEL
ALT: 12 U/L (ref 0–35)
AST: 9 U/L (ref 0–37)
Albumin: 4.2 g/dL (ref 3.5–5.2)
Alkaline Phosphatase: 57 U/L (ref 39–117)
BUN: 12 mg/dL (ref 6–23)
CO2: 29 mEq/L (ref 19–32)
Calcium: 10.2 mg/dL (ref 8.4–10.5)
Chloride: 102 mEq/L (ref 96–112)
Creatinine, Ser: 0.71 mg/dL (ref 0.40–1.20)
GFR: 105.55 mL/min (ref >60.00)
Glucose, Bld: 120 mg/dL — ABNORMAL HIGH (ref 70–99)
Potassium: 4.2 mEq/L (ref 3.5–5.1)
Sodium: 139 mEq/L (ref 135–145)
Total Bilirubin: 0.5 mg/dL (ref 0.2–1.2)
Total Protein: 6.8 g/dL (ref 6.0–8.3)

## 2020-12-07 LAB — LIPID PANEL
Cholesterol: 224 mg/dL — ABNORMAL HIGH (ref 0–200)
HDL: 51.2 mg/dL (ref >39.00)
LDL Cholesterol: 140 mg/dL — ABNORMAL HIGH (ref 0–99)
NonHDL: 173.25
Total CHOL/HDL Ratio: 4
Triglycerides: 164 mg/dL — ABNORMAL HIGH (ref 0.0–149.0)
VLDL: 32.8 mg/dL (ref 0.0–40.0)

## 2020-12-07 MED ORDER — SAXENDA 18 MG/3ML ~~LOC~~ SOPN: 0.6000 mg | PEN_INJECTOR | Freq: Every day | SUBCUTANEOUS | 2 refills | Status: DC

## 2020-12-07 NOTE — Telephone Encounter (Signed)
7 d.

## 2020-12-07 NOTE — Telephone Encounter (Signed)
[facility] [phone]) needs clarification on the saxenda sent in today. If not a new start what is she to be taking daily. First set of instructions says to injection 0.6 daily--how many days?

## 2020-12-07 NOTE — Patient Instructions (Addendum)
Give [place] 2-3 business days to get the results of your labs back.   Keep the diet clean and stay active.  Consider Pepcid (famotidine) for reflux relief as needed.  Call the weight loss team to see if it is more affordable to be seen. That would the first line option.  Let me know if the shot is too expensive. Don't fill it also in this case.   Let us know if you need anything.

## 2020-12-07 NOTE — Telephone Encounter (Signed)
Resent to say 7 days

## 2020-12-07 NOTE — Progress Notes (Signed)
Chief Complaint  Patient presents with  . Annual Exam     Well Woman Brenda Williams is here for a complete physical.   Her last physical was >1 year ago.  Current diet: in general, a "healthy" diet. Current exercise: walking, stepper. Weight is stable and she denies fatigue out of ordinary. Seatbelt? Yes  Health Maintenance Pap/HPV- Yes Tetanus- Yes Hep C screening- No HIV screening- Yes  Past Medical History:  Diagnosis Date  . Abnormal Pap smear 2006   leep  . Anxiety   . Arthritis    osteoarthritis  . Asthma   . Complication of anesthesia    [facility], HAD ??  SEIZURE/ SHAKING  . Cough   . Depression   . Fatigue   . GERD (gastroesophageal reflux disease)   . Herpes    never had outbreak. pos per blood, doesn't know which type  . Lactose intolerance   . Lymphangiomatosis   . Polycystic ovarian syndrome   . Prediabetes   . Sleep apnea    uses CPAP  . SVD (spontaneous vaginal delivery)    x 1  . Varicose vein of leg   . Vitamin D deficiency      Past Surgical History:  Procedure Laterality Date  . DILATION AND EVACUATION N/A 09/14/2012   Procedure: DILATATION AND EVACUATION;  Surgeon: Allena Katz, MD;  Location: [facility];  Service: Gynecology;  Laterality: N/A;  . HYSTEROSCOPY  2009   uterine polyp  . IVF Retrival  2010, 2011  . KNEE SURGERY  1998   right knee  . LEEP  2006  . LIPOSUCTION  2021  . LUNG BIOPSY Right 12/28/2015   Procedure: RIGHT LUNG BIOPSY;  Surgeon: Ivin Poot, MD;  Location: [facility];  Service: Thoracic;  Laterality: Right;  . SPHINCTEROTOMY  2010  . tummy tuck  11/12/2019  . uterine polyp removal    . VIDEO ASSISTED THORACOSCOPY Right 12/28/2015   Procedure: RIGHT VIDEO ASSISTED THORACOSCOPY;  Surgeon: Ivin Poot, MD;  Location: [facility];  Service: Thoracic;  Laterality: Right;    Medications  Current Outpatient Medications on File Prior to Visit  Medication Sig Dispense Refill  . albuterol (VENTOLIN HFA) 108  (90 Base) MCG/ACT inhaler Inhale 2 puffs into the lungs every 4 (four) hours as needed for wheezing or shortness of breath. 18 g 0  . budesonide-formoterol (SYMBICORT) 80-4.5 MCG/ACT inhaler Inhale 2 puffs into the lungs in the morning and at bedtime. 10.2 g 12  . fluticasone (FLONASE) 50 MCG/ACT nasal spray Place 1-2 sprays into both nostrils daily. 16 g 0  . ibuprofen (ADVIL) 600 MG tablet Take 1 tablet (600 mg total) by mouth every 6 (six) hours as needed. 30 tablet 0  . pantoprazole (PROTONIX) 40 MG tablet Take 1 tablet (40 mg total) by mouth daily. 30 tablet 1  . sirolimus (RAPAMUNE) 1 MG tablet Take 1 tablet (1 mg total) by mouth 2 (two) times daily. 180 tablet 1    Allergies Allergies  Allergen Reactions  . Fentanyl Itching    Benadryl effective for itching.    Review of Systems: Constitutional:  no unexpected weight changes Eye:  no recent significant change in vision Ear/Nose/Mouth/Throat:  Ears:  no recent change in hearing Nose/Mouth/Throat:  no complaints of nasal congestion, no sore throat Cardiovascular: no chest pain Respiratory:  no shortness of breath Gastrointestinal:  no abdominal pain, no change in bowel habits GU:  Female: negative for dysuria or  pelvic pain Musculoskeletal/Extremities:  no pain of the joints Integumentary (Skin/Breast):  no abnormal skin lesions reported Neurologic:  no headaches Endocrine:  denies fatigue Hematologic/Lymphatic:  No areas of easy bleeding  Exam BP 118/82 (BP Location: Left Arm, Patient Position: Sitting, Cuff Size: Normal)   Pulse 77   Temp 98.3 F (36.8 C) (Oral)   Ht 5\' 7"  (1.702 m)   Wt 242 lb (109.8 kg)   SpO2 96%   BMI 37.90 kg/m  General:  well developed, well nourished, in no apparent distress Skin:  no significant moles, warts, or growths Head:  no masses, lesions, or tenderness Eyes:  pupils equal and round, sclera anicteric without injection Ears:  canals without lesions, TMs shiny without retraction, no  obvious effusion, no erythema Nose:  nares patent, septum midline, mucosa normal, and no drainage or sinus tenderness Throat/Pharynx:  lips and gingiva without lesion; tongue and uvula midline; non-inflamed pharynx; no exudates or postnasal drainage Neck: neck supple without adenopathy, thyromegaly, or masses Lungs:  clear to auscultation, breath sounds equal bilaterally, no respiratory distress Cardio:  regular rate and rhythm, no LE edema Abdomen:  abdomen soft, nontender; bowel sounds normal; no masses or organomegaly Genital: Defer to GYN Musculoskeletal:  symmetrical muscle groups noted without atrophy or deformity Extremities:  no clubbing, cyanosis, or edema, no deformities, no skin discoloration Neuro:  gait normal; deep tendon reflexes normal and symmetric Psych: well oriented with normal range of affect and appropriate judgment/insight  Assessment and Plan  Well adult exam - Plan: Comprehensive metabolic panel, Hemoglobin A1c, Lipid panel  Pulmonary emphysema, unspecified emphysema type (HCC)  Severe episode of recurrent major depressive disorder, without psychotic features ([place]), Chronic  Encounter for hepatitis C screening test for low risk patient - Plan: Hepatitis C antibody  Obesity (BMI 30-39.33)   Well 40 y.o. female. Counseled on diet and exercise. Other orders as above. Start [place], she will reach out to [facility] and see what her OOP cost would be moving forward.  Follow up 6 weeks to reck weight. The patient voiced understanding and agreement to the plan.  [place], DO 12/07/20 10:15 AM

## 2020-12-11 ENCOUNTER — Other Ambulatory Visit: Payer: Self-pay

## 2020-12-11 ENCOUNTER — Encounter (HOSPITAL_COMMUNITY)
Admission: RE | Admit: 2020-12-11 | Discharge: 2020-12-11 | Disposition: A | Discharge status: Home / Self Care | Payer: Managed Care, Other (non HMO) | Source: Ambulatory Visit | Attending: Emergency Medicine | Admitting: Emergency Medicine

## 2020-12-11 VITALS — Wt 241.8 lb

## 2020-12-11 DIAGNOSIS — J8481 Lymphangioleiomyomatosis: Secondary | ICD-10-CM

## 2020-12-11 LAB — HEPATITIS C ANTIBODY
Hepatitis C Ab: NONREACTIVE
SIGNAL TO CUT-OFF: 0.02 (ref <1.00)

## 2020-12-11 NOTE — Progress Notes (Signed)
Daily Session Note  Patient Details  Name: Brenda Williams MRN: [id number] Date of Birth: June 02, 1981 Referring Provider:   April Manson Pulmonary Rehab Walk Test from 10/12/2020 in [place]  Referring Provider Dr. Lamonte Sakai      Encounter Date: 12/11/2020  Check In:  Session Check In - 12/11/20 1443      Check-In   Supervising physician immediately available to respond to emergencies [facility] immediately available    Physician(s) Dr. Marthenia Rolling    Location [facility]    Staff Present Rosebud Poles, RN, Isaac Laud, MS, ACSM-CEP, Exercise Physiologist;Carlette Wilber Oliphant, RN, BSN;Ramon Dredge, RN, MHA    Virtual Visit No    Medication changes reported     No    Fall or balance concerns reported    No    Tobacco Cessation No Change    Warm-up and Cool-down Performed as group-led instruction    Resistance Training Performed Yes    VAD Patient? No    PAD/SET Patient? No      Pain Assessment   Currently in Pain? No/denies    Multiple Pain Sites No           Capillary Blood Glucose: No results found for this or any previous visit (from the past 24 hour(s)).   Exercise Prescription Changes - 12/11/20 1400      Response to Exercise   Blood Pressure (Admit) 122/84    Blood Pressure (Exercise) 104/80    Blood Pressure (Exit) 116/58    Heart Rate (Admit) 88 bpm    Heart Rate (Exercise) 119 bpm    Heart Rate (Exit) 82 bpm    Oxygen Saturation (Admit) 95 %    Oxygen Saturation (Exercise) 89 %    Oxygen Saturation (Exit) 95 %    Rating of Perceived Exertion (Exercise) 13    Perceived Dyspnea (Exercise) 2    Duration Continue with 30 min of aerobic exercise without signs/symptoms of physical distress.    Intensity THRR unchanged      Progression   Progression Continue to progress workloads to maintain intensity without signs/symptoms of physical distress.      Resistance Training   Training Prescription Yes     Weight red bands    Reps 10-15    Time 10 Minutes      Treadmill   MPH 2.2    Grade 1    Minutes 15      NuStep   Level 4    SPM 80    Minutes 15    METs 2.3           Social History   Tobacco Use  Smoking Status Never Smoker  Smokeless Tobacco Never Used    Goals Met:  Proper associated with RPD/PD & O2 Sat Exercise tolerated well Strength training completed today  Goals Unmet:  Not Applicable  Comments: Service time is from 7948 to 1450    Dr. Fransico Him is Medical Director for Cardiac Rehab at [facility].

## 2020-12-13 ENCOUNTER — Encounter (HOSPITAL_COMMUNITY)
Admission: RE | Admit: 2020-12-13 | Discharge: 2020-12-13 | Disposition: A | Discharge status: Home / Self Care | Payer: Managed Care, Other (non HMO) | Source: Ambulatory Visit | Attending: Emergency Medicine | Admitting: Emergency Medicine

## 2020-12-13 ENCOUNTER — Other Ambulatory Visit: Payer: Self-pay

## 2020-12-13 DIAGNOSIS — J8481 Lymphangioleiomyomatosis: Secondary | ICD-10-CM | POA: Diagnosis not present

## 2020-12-13 NOTE — Progress Notes (Signed)
Daily Session Note  Patient Details  Name: Brenda Williams MRN: [id number] Date of Birth: 11/16/1980 Referring Provider:   April Manson Pulmonary Rehab Walk Test from 10/12/2020 in [place]  Referring Provider Dr. Lamonte Sakai      Encounter Date: 12/13/2020  Check In:  Session Check In - 12/13/20 1456      Check-In   Supervising physician immediately available to respond to emergencies [facility] immediately available    Physician(s) Dr. Tana Coast    Location [facility]    Staff Present Leda Roys, MS, ACSM-CEP, Exercise Physiologist;Annedrea Rosezella [place], RN, MHA;David Makemson, MS, ACSM-CEP, CCRP, Exercise Physiologist;Jetta Walker BS, ACSM EP-C, Exercise Physiologist    Virtual Visit No    Medication changes reported     No    Fall or balance concerns reported    No    Tobacco Cessation No Change    Warm-up and Cool-down Performed as group-led instruction    Resistance Training Performed Yes    VAD Patient? No    PAD/SET Patient? No      Pain Assessment   Currently in Pain? No/denies    Pain Score 0-No pain    Multiple Pain Sites No           Capillary Blood Glucose: No results found for this or any previous visit (from the past 24 hour(s)).    Social History   Tobacco Use  Smoking Status Never Smoker  Smokeless Tobacco Never Used    Goals Met:  Proper associated with RPD/PD & O2 Sat Independence with exercise equipment Exercise tolerated well No report of cardiac concerns or symptoms Strength training completed today  Goals Unmet:  Not Applicable  Comments: Service time is from 1320 to 1430    Dr. Fransico Him is Medical Director for Cardiac Rehab at [facility].

## 2020-12-14 ENCOUNTER — Encounter: Payer: Self-pay | Admitting: Family Medicine

## 2020-12-14 ENCOUNTER — Ambulatory Visit: Payer: Managed Care, Other (non HMO) | Admitting: Family Medicine

## 2020-12-14 ENCOUNTER — Other Ambulatory Visit: Payer: Self-pay

## 2020-12-14 VITALS — BP 128/84 | HR 81 | Temp 98.0°F | Ht 67.0 in | Wt 240.5 lb

## 2020-12-14 DIAGNOSIS — E1165 Type 2 diabetes mellitus with hyperglycemia: Secondary | ICD-10-CM | POA: Diagnosis not present

## 2020-12-14 DIAGNOSIS — Z23 Encounter for immunization: Secondary | ICD-10-CM

## 2020-12-14 MED ORDER — ROSUVASTATIN CALCIUM 20 MG PO TABS: 20.0000 mg | ORAL_TABLET | Freq: Every day | ORAL | 3 refills | Status: DC

## 2020-12-14 MED ORDER — OZEMPIC (0.25 OR 0.5 MG/DOSE) 2 MG/1.5ML ~~LOC~~ SOPN: PEN_INJECTOR | SUBCUTANEOUS | 1 refills | Status: DC

## 2020-12-14 NOTE — Progress Notes (Signed)
Chief Complaint  Patient presents with  . Follow-up    Subjective: Patient is a 40 y.o. female here for lab follow-up.  A1c 6.5.  She is not on a statin.  She does not check her sugars.  Diet has been improving.  We tried sending in [place] which was not covered by insurance.  This was before her diabetes diagnosis.  She does have an eye doctor but does not see them yearly.  She is unsure if she has had pneumonia vaccine since the Prevnar in 2017.  Diet is improving.  She is exercising routinely with cardio and weight resistance activity.  Past Medical History:  Diagnosis Date  . Abnormal Pap smear 2006   leep  . Anxiety   . Arthritis    osteoarthritis  . Asthma   . Complication of anesthesia    [facility], HAD ??  SEIZURE/ SHAKING  . Cough   . Depression   . Fatigue   . GERD (gastroesophageal reflux disease)   . Herpes    never had outbreak. pos per blood, doesn't know which type  . Lactose intolerance   . Lymphangiomatosis   . Polycystic ovarian syndrome   . Prediabetes   . Sleep apnea    uses CPAP  . SVD (spontaneous vaginal delivery)    x 1  . Varicose vein of leg   . Vitamin D deficiency     Objective: BP 128/84 (BP Location: Left Arm, Patient Position: Sitting, Cuff Size: Normal)   Pulse 81   Temp 98 F (36.7 C) (Oral)   Ht 5\' 7"  (1.702 m)   Wt 240 lb 8 oz (109.1 kg)   SpO2 94%   BMI 37.67 kg/m  General: Awake, appears stated age Skin: No external lesions noted Neuro: Sensation intact to pinprick over bilateral feet Heart: DP pulses 2+ bilaterally Lungs: No accessory muscle use Psych: Age appropriate judgment and insight, normal affect and mood  Assessment and Plan: Type 2 diabetes mellitus with hyperglycemia, without long-term current use of insulin (HCC) - Plan: Microalbumin / creatinine urine ratio, rosuvastatin (CRESTOR) 20 MG tablet, Semaglutide,0.25 or 0.5MG /DOS, (OZEMPIC, 0.25 OR 0.5 MG/DOSE,) 2 MG/1.5ML SOPN, HM DIABETES FOOT EXAM,  Hepatic function panel, Lipid panel  Need for vaccination against Streptococcus pneumoniae - Plan: Pneumococcal polysaccharide vaccine 23-valent greater than or equal to 2yo subcutaneous/IM  Update pneumonia vaccine today.  Check microalbumin creatinine ratio.  Start Crestor.  Start Ozempic.  Needs to get a hold of eye doctor for an appointment. She is technically controlled, will recheck labs in 6 weeks, I will see her in 6 months. The patient voiced understanding and agreement to the plan.  [place], DO 12/14/20  2:53 PM

## 2020-12-14 NOTE — Patient Instructions (Addendum)
Please schedule an appt with your eye provider.   Keep the diet clean and stay active.  Call your insurance company to see what glucose monitor they will cover and let me know. For now, you do not need to monitor.  Give [place] 2-3 business days to get the results of your labs back.   Let us know if you need anything.

## 2020-12-17 LAB — MICROALBUMIN / CREATININE URINE RATIO
Creatinine,U: 196.5 mg/dL
Microalb Creat Ratio: 1.6 mg/g (ref 0.0–30.0)
Microalb, Ur: 3.2 mg/dL — ABNORMAL HIGH (ref 0.0–1.9)

## 2020-12-18 ENCOUNTER — Telehealth: Payer: Self-pay | Admitting: Family Medicine

## 2020-12-18 ENCOUNTER — Encounter (HOSPITAL_COMMUNITY)
Admission: RE | Admit: 2020-12-18 | Discharge: 2020-12-18 | Disposition: A | Discharge status: Home / Self Care | Payer: Managed Care, Other (non HMO) | Source: Ambulatory Visit | Attending: Emergency Medicine | Admitting: Emergency Medicine

## 2020-12-18 ENCOUNTER — Other Ambulatory Visit: Payer: Self-pay | Admitting: Family Medicine

## 2020-12-18 ENCOUNTER — Other Ambulatory Visit: Payer: Self-pay

## 2020-12-18 DIAGNOSIS — J8481 Lymphangioleiomyomatosis: Secondary | ICD-10-CM | POA: Diagnosis not present

## 2020-12-18 MED ORDER — TRULICITY 0.75 MG/0.5ML ~~LOC~~ SOAJ: 0.7500 mg | SUBCUTANEOUS | 0 refills | Status: AC

## 2020-12-18 MED ORDER — TRULICITY 1.5 MG/0.5ML ~~LOC~~ SOAJ: 1.5000 mg | SUBCUTANEOUS | 1 refills | Status: DC

## 2020-12-18 NOTE — Progress Notes (Signed)
Daily Session Note  Patient Details  Name: Brenda Williams MRN: [id number] Date of Birth: 10/05/1980 Referring Provider:   April Manson Pulmonary Rehab Walk Test from 10/12/2020 in [place]  Referring Provider Dr. Lamonte Sakai      Encounter Date: 12/18/2020  Check In:  Session Check In - 12/18/20 1442      Check-In   Supervising physician immediately available to respond to emergencies [facility] immediately available    Physician(s) Dr. Tana Coast    Location [facility]    Staff Present Leda Roys, MS, ACSM-CEP, Exercise Physiologist;Lonnie Rosado Ysidro Evert, RN;Other    Virtual Visit No    Medication changes reported     No    Fall or balance concerns reported    No    Tobacco Cessation No Change    Warm-up and Cool-down Performed as group-led instruction    Resistance Training Performed Yes    VAD Patient? No    PAD/SET Patient? No      Pain Assessment   Currently in Pain? No/denies    Pain Score 0-No pain    Multiple Pain Sites No           Capillary Blood Glucose: No results found for this or any previous visit (from the past 24 hour(s)).    Social History   Tobacco Use  Smoking Status Never Smoker  Smokeless Tobacco Never Used    Goals Met:  Exercise tolerated well No report of cardiac concerns or symptoms Strength training completed today  Goals Unmet:  Not Applicable  Comments: Service time is from 1315 to 1430    Dr. Fransico Him is Medical Director for Cardiac Rehab at [facility].

## 2020-12-18 NOTE — Telephone Encounter (Signed)
PReauth needed for Semaglutide,0.25 or 0.5MG /DOS, (OZEMPIC, 0.25 OR 0.5 MG/DOSE,) 2 MG/1.5ML SOPN   Msg was sent following a phone call from patient. Please advise patient once approval is sent. Thanks

## 2020-12-18 NOTE — Telephone Encounter (Signed)
Ins requires a prior auth but they have alternatives.  Would you like to change to one of these before trying the prior auth?  Alternatives: METFORMIN HCL ER TABS 500MG   METFORMIN HCL ER TABS 750MG   BYETTA INJECT/PEN 5MCG .  BYETTA INJECT/PEN 10MCG .  TRULICITY SD PEN 4.4HQ 4'S 0. 0.75MG   TRULICITY SD PEN 7.5FF 4'S 1. 1.5MG   BYDUREON BCISE AUTOINJECTR 0. 2MG   TRULICITY SD PEN 6.3WG 4'S 3MG 

## 2020-12-18 NOTE — Telephone Encounter (Signed)
Patient has been informed that Trulicity has been sent in to replace Ozempic. The patient verbalized understanding.

## 2020-12-19 ENCOUNTER — Telehealth: Payer: Self-pay | Admitting: *Deleted

## 2020-12-19 NOTE — Telephone Encounter (Signed)
Trulicity 0.75mg  covered  RAQTMA:[id number];GYBWLS:[id number];Review Type:Prior Auth;Coverage Start Date:12/19/2020;Coverage End Date:12/19/2021;

## 2020-12-19 NOTE — Telephone Encounter (Signed)
Prior auth started for Trulicity via cover my meds  Trulicity 0.75mg  VPL:[id number] Trulicity 1.5 Key: [employer]

## 2020-12-19 NOTE — Telephone Encounter (Signed)
Trulicity 1.5mg  CaseId:[id number];Status:Approved;Review Type:Prior Auth;Coverage Start Date:12/19/2020;Coverage End Date:12/19/2021

## 2020-12-20 ENCOUNTER — Other Ambulatory Visit: Payer: Self-pay

## 2020-12-20 ENCOUNTER — Encounter (HOSPITAL_COMMUNITY)
Admission: RE | Admit: 2020-12-20 | Discharge: 2020-12-20 | Disposition: A | Discharge status: Home / Self Care | Payer: Managed Care, Other (non HMO) | Source: Ambulatory Visit | Attending: Emergency Medicine | Admitting: Emergency Medicine

## 2020-12-20 DIAGNOSIS — J8481 Lymphangioleiomyomatosis: Secondary | ICD-10-CM

## 2020-12-20 NOTE — Progress Notes (Signed)
Daily Session Note  Patient Details  Name: Brenda Williams MRN: [id number] Date of Birth: Nov 23, 1980 Referring Provider:   April Manson Pulmonary Rehab Walk Test from 10/12/2020 in [place]  Referring Provider Dr. Lamonte Sakai       Encounter Date: 12/20/2020  Check In:  Session Check In - 12/20/20 1424       Check-In   Supervising physician immediately available to respond to emergencies [facility] immediately available    Physician(s) Dr. Lonny Prude    Location [facility]    Staff Present Rodney Langton, RN;Other;Jessica Hassell Done, MS, ACSM-CEP, Exercise Physiologist;Carlette Wilber Oliphant, RN, BSN    Virtual Visit No    Medication changes reported     No    Fall or balance concerns reported    No    Tobacco Cessation No Change    Warm-up and Cool-down Performed as group-led instruction    Resistance Training Performed Yes    VAD Patient? No    PAD/SET Patient? No      Pain Assessment   Currently in Pain? No/denies    Multiple Pain Sites No             Capillary Blood Glucose: No results found for this or any previous visit (from the past 24 hour(s)).    Social History   Tobacco Use  Smoking Status Never  Smokeless Tobacco Never    Goals Met:  Proper associated with RPD/PD & O2 Sat Independence with exercise equipment Exercise tolerated well No report of cardiac concerns or symptoms Strength training completed today  Goals Unmet:  Not Applicable  Comments: Service time is from 1320 to 1435    Dr. Fransico Him is Medical Director for Cardiac Rehab at [facility].

## 2020-12-25 ENCOUNTER — Encounter (HOSPITAL_COMMUNITY)
Admission: RE | Admit: 2020-12-25 | Discharge: 2020-12-25 | Disposition: A | Discharge status: Home / Self Care | Payer: Managed Care, Other (non HMO) | Source: Ambulatory Visit | Attending: Emergency Medicine | Admitting: Emergency Medicine

## 2020-12-25 ENCOUNTER — Other Ambulatory Visit: Payer: Self-pay

## 2020-12-25 VITALS — Wt 243.4 lb

## 2020-12-25 DIAGNOSIS — J8481 Lymphangioleiomyomatosis: Secondary | ICD-10-CM

## 2020-12-25 NOTE — Progress Notes (Signed)
Daily Session Note  Patient Details  Name: Brenda Williams MRN: [id number] Date of Birth: 1981/06/10 Referring Provider:   April Manson Pulmonary Rehab Walk Test from 10/12/2020 in [place]  Referring Provider Dr. Lamonte Sakai       Encounter Date: 12/25/2020  Check In:  Session Check In - 12/25/20 1336       Check-In   Supervising physician immediately available to respond to emergencies [facility] immediately available    Physician(s) Dr. Lonny Prude    [facility]    Staff Present Rosebud Poles, RN, BSN;Schawn Byas Ysidro Evert, RN;Jessica Hassell Done, MS, ACSM-CEP, Exercise Physiologist    Virtual Visit No    Medication changes reported     No    Fall or balance concerns reported    No    Tobacco Cessation No Change    Warm-up and Cool-down Performed as group-led instruction    Resistance Training Performed Yes    VAD Patient? No    PAD/SET Patient? No      Pain Assessment   Currently in Pain? No/denies    Multiple Pain Sites No             Capillary Blood Glucose: No results found for this or any previous visit (from the past 24 hour(s)).   Exercise Prescription Changes - 12/25/20 1400       Response to Exercise   Blood Pressure (Admit) 132/76    Blood Pressure (Exercise) 144/68    Blood Pressure (Exit) 108/64    Heart Rate (Admit) 93 bpm    Heart Rate (Exercise) 112 bpm    Heart Rate (Exit) 84 bpm    Oxygen Saturation (Admit) 96 %    Oxygen Saturation (Exercise) 91 %    Oxygen Saturation (Exit) 96 %    Rating of Perceived Exertion (Exercise) 12    Perceived Dyspnea (Exercise) 1    Duration Continue with 30 min of aerobic exercise without signs/symptoms of physical distress.    Intensity THRR unchanged      Progression   Progression Continue to progress workloads to maintain intensity without signs/symptoms of physical distress.      Resistance Training   Training Prescription Yes    Weight red bands    Reps  10-15    Time 10 Minutes      NuStep   Level 4    SPM 80    Minutes 15    METs 2.1      Track   Laps 10    Minutes 15             Social History   Tobacco Use  Smoking Status Never  Smokeless Tobacco Never    Goals Met:  Exercise tolerated well No report of cardiac concerns or symptoms Strength training completed today  Goals Unmet:  Not Applicable  Comments: Service time is from 1315 to 1430    Dr. Fransico Him is Medical Director for Cardiac Rehab at [facility].

## 2020-12-26 ENCOUNTER — Encounter: Payer: Self-pay | Admitting: Family Medicine

## 2020-12-26 ENCOUNTER — Ambulatory Visit: Payer: Managed Care, Other (non HMO) | Admitting: Family Medicine

## 2020-12-26 VITALS — BP 120/82 | HR 82 | Temp 98.0°F | Ht 67.0 in | Wt 241.4 lb

## 2020-12-26 DIAGNOSIS — K1120 Sialoadenitis, unspecified: Secondary | ICD-10-CM

## 2020-12-26 DIAGNOSIS — K1379 Other lesions of oral mucosa: Secondary | ICD-10-CM

## 2020-12-26 MED ORDER — AMOXICILLIN-POT CLAVULANATE 875-125 MG PO TABS
1.0000 | ORAL_TABLET | Freq: Two times a day (BID) | ORAL | 0 refills | Status: AC
Start: 2020-12-26 — End: 2021-01-02

## 2020-12-26 NOTE — Progress Notes (Signed)
Chief Complaint  Patient presents with   lump on neck    Brenda Williams is a 40 y.o. female here for a skin complaint.  Duration: 1 month Location: L side of neck under jaw Pruritic? No Painful? No Drainage? No Recent travel? No Sick contacts? No Other associated symptoms: Strange sensation under L portion of tongue; It has gotten bigger Therapies tried thus far: none  Past Medical History:  Diagnosis Date   Abnormal Pap smear 2006   leep   Anxiety    Arthritis    osteoarthritis   Asthma    Complication of anesthesia    2014 d+c [facility], HAD ??  SEIZURE/ SHAKING   Cough    Depression    Fatigue    GERD (gastroesophageal reflux disease)    Herpes    never had outbreak. pos per blood, doesn't know which type   Lactose intolerance    Lymphangiomatosis    Polycystic ovarian syndrome    Prediabetes    Sleep apnea    uses CPAP   SVD (spontaneous vaginal delivery)    x 1   Varicose vein of leg    Vitamin D deficiency     BP 120/82   Pulse 82   Temp 98 F (36.7 C) (Oral)   Ht 5\' 7"  (1.702 m)   Wt 241 lb 6 oz (109.5 kg)   SpO2 96%   BMI 37.80 kg/m  Gen: awake, alert, appearing stated age Lungs: No accessory muscle use Mouth: MMM, subglossal erythematous papules x3 on L, slight edema; no drainage Neck: Mildly ttp over the L submand gland, enlarged compared to R; no fluctuance, erythema, drainage, pustules Psych: Age appropriate judgment and insight  Mouth sores - Plan: amoxicillin-clavulanate (AUGMENTIN) 875-125 MG tablet  Submandibular gland inflammation  I think the mouth inflammation is causing lymph drainage to the gland. 7 d course of Augmentin. If no improvement, will refer to ENT.  F/u prn. The patient voiced understanding and agreement to the plan.  [place], DO 12/26/20 8:33 AM

## 2020-12-26 NOTE — Patient Instructions (Signed)
Ice/cold pack over area for 10-15 min twice daily.  OK to take Tylenol 1000 mg (2 extra strength tabs) or 975 mg (3 regular strength tabs) every 6 hours as needed.  Send me a message on Mon or Tues if you aren't turning the corner.   Let us know if you need anything.

## 2020-12-27 ENCOUNTER — Encounter (HOSPITAL_COMMUNITY)
Admission: RE | Admit: 2020-12-27 | Discharge: 2020-12-27 | Disposition: A | Discharge status: Home / Self Care | Payer: Managed Care, Other (non HMO) | Source: Ambulatory Visit | Attending: Emergency Medicine | Admitting: Emergency Medicine

## 2020-12-27 ENCOUNTER — Telehealth: Payer: Self-pay | Admitting: Family Medicine

## 2020-12-27 ENCOUNTER — Other Ambulatory Visit: Payer: Self-pay

## 2020-12-27 DIAGNOSIS — J8481 Lymphangioleiomyomatosis: Secondary | ICD-10-CM | POA: Diagnosis not present

## 2020-12-27 NOTE — Telephone Encounter (Signed)
Caller: [employer] (Cover My Meds)  Call back # [phone] Ref # [id number]   They have further questions in regards to ozempic.

## 2020-12-28 NOTE — Telephone Encounter (Signed)
Called cover my meds and they just needed to update on patients demographics. Also informed patient is now on Trulicity. They noted  this change in therapy

## 2021-01-01 NOTE — Progress Notes (Signed)
Discharge Progress Report  Patient Details  Name: Brenda Williams MRN: [id number] Date of Birth: September 14, 1980 Referring Provider:   April Manson Pulmonary Rehab Walk Test from 10/12/2020 in [place]  Referring Provider Dr. Lamonte Sakai        Number of Visits: 12  Reason for Discharge:  Patient reached a stable level of exercise. Patient independent in their exercise. Patient has met program and personal goals.  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 Lamonte Sakai    Expected Discharge Date 12/13/20      Treadmill   MPH 2.2    Grade 0    Minutes 15      NuStep   Level 2    SPM 80    Minutes 15      Prescription Details   Frequency (times per week) 2    Duration Progress to 30 minutes of continuous aerobic without signs/symptoms of physical distress      Intensity   THRR 40-80% of Max Heartrate 72-143    Ratings of Perceived Exertion 11-13    Perceived Dyspnea 0-4      Progression   Progression Continue to progress workloads to maintain intensity without signs/symptoms of physical distress.      Resistance Training   Training Prescription Yes    Weight Red bands    Reps 10-15             Discharge Exercise Prescription (Final Exercise Prescription Changes):  Exercise Prescription Changes - 12/25/20 1400       Response to Exercise   Blood Pressure (Admit) 132/76    Blood Pressure (Exercise) 144/68     Blood Pressure (Exit) 108/64    Heart Rate (Admit) 93 bpm    Heart Rate (Exercise) 112 bpm    Heart Rate (Exit) 84 bpm    Oxygen Saturation (Admit) 96 %    Oxygen Saturation (Exercise) 91 %    Oxygen Saturation (Exit) 96 %    Rating of Perceived Exertion (Exercise) 12    Perceived Dyspnea (Exercise) 1    Duration Continue with 30 min of aerobic exercise without signs/symptoms of physical distress.    Intensity THRR unchanged      Progression   Progression Continue to progress workloads to maintain intensity without signs/symptoms of physical distress.      Resistance Training   Training Prescription Yes    Weight red bands    Reps 10-15    Time 10 Minutes      NuStep   Level 4    SPM 80    Minutes 15    METs 2.1      Track   Laps 10  Minutes 15             Functional Capacity:  6 Minute Walk     Row Name 10/12/20 1207 12/27/20 1622       6 Minute Walk   Phase Initial Discharge    Distance 1294 feet 1287 feet    Distance % Change -- -0.54 %    Distance Feet Change -- -7 ft    Walk Time 6 minutes 6 minutes    # of Rest Breaks 0 0    MPH 2.45 2.44    METS 4.07 4.09    RPE 11 12    Perceived Dyspnea  2 2    VO2 Peak 14.26 14.3    Symptoms Yes (comment) Yes (comment)    Comments pt complained of chest tightness but is aware this is due to lung issue pt complained of chest tightness but is aware it is due to lung disease    Resting HR 79 bpm 87 bpm    Resting BP 130/82 138/74    Resting Oxygen Saturation  94 % 95 %    Exercise Oxygen Saturation  during 6 min walk 87 % 86 %    Max Ex. HR 112 bpm 115 bpm    Max Ex. BP 136/90 136/70    2 Minute Post BP 132/86 128/70         Interval HR      1 Minute HR 98 105    2 Minute HR 101 115    3 Minute HR 96 105    4 Minute HR 105 106    5 Minute HR 112 106    6 Minute HR 107 106    2 Minute Post HR 79 85    Interval Heart Rate? Yes Yes         Interval Oxygen      Interval Oxygen? Yes Yes    Baseline  Oxygen Saturation % 94 % 95 %    1 Minute Oxygen Saturation % 92 % 90 %    1 Minute Liters of Oxygen 0 L 0 L    2 Minute Oxygen Saturation % 89 % 86 %    2 Minute Liters of Oxygen 0 L 0 L    3 Minute Oxygen Saturation % 87 % 87 %    3 Minute Liters of Oxygen 0 L 0 L    4 Minute Oxygen Saturation % 87 % 87 %    4 Minute Liters of Oxygen 0 L 0 L    5 Minute Oxygen Saturation % 87 % 86 %    5 Minute Liters of Oxygen 0 L 0 L    6 Minute Oxygen Saturation % 87 % 87 %    6 Minute Liters of Oxygen 0 L 0 L    2 Minute Post Oxygen Saturation % 96 % 95 %    2 Minute Post Liters of Oxygen 0 L 0 L            Psychological, QOL, Others - Outcomes: PHQ 2/9: Depression screen [facility] 2/9 12/27/2020 12/07/2020 10/12/2020 01/28/2017 04/28/2016  Decreased Interest 0 1 0 3 0  Down, Depressed, Hopeless 0 1 0 3 0  PHQ - 2 Score 0 2 0 6 0  Altered sleeping 0 0 0 1 -  Tired, decreased energy 0 1 2 - -  Change in appetite 0 - 0 3 -  Feeling bad or failure about yourself  0 1 0 2 -  Trouble concentrating 0 0 0 2 -  Moving slowly or fidgety/restless 0 0 0 2 -  Suicidal thoughts 0 0 0 1 -  PHQ-9 Score 0 4 - 17 -  Difficult doing work/chores Not difficult at all Not difficult at all Not difficult at all - -  Some recent data might be hidden    Quality of Life:   Personal Goals: Goals established at orientation with interventions provided to work toward goal.  Personal Goals and Risk Factors at Admission - 10/12/20 0944       Core Components/Risk Factors/Patient Goals on Admission    Weight Management Yes;Obesity    Goal Weight: Short Term 200 lb (90.7 kg)    Goal Weight: Long Term 175 lb (79.4 kg)    Improve shortness of breath with ADL's Yes    Intervention Provide education, individualized exercise plan and daily activity instruction to help decrease symptoms of SOB with activities of daily living.    Expected Outcomes Short Term: Improve cardiorespiratory fitness to achieve a reduction of symptoms  when performing ADLs;Long Term: Be able to perform more ADLs without symptoms or delay the onset of symptoms              Personal Goals Discharge:  Goals and Risk Factor Review     Row Name 11/01/20 0841 11/26/20 1411           Core Components/Risk Factors/Patient Goals Review   Personal Goals Review Develop more efficient breathing techniques such as purse lipped breathing and diaphragmatic breathing and practicing self-pacing with activity.;Increase knowledge of respiratory medications and ability to use respiratory devices properly.;Improve shortness of breath with ADL's Develop more efficient breathing techniques such as purse lipped breathing and diaphragmatic breathing and practicing self-pacing with activity.;Increase knowledge of respiratory medications and ability to use respiratory devices properly.;Improve shortness of breath with ADL's      Review Tested positive for Covid-19 4/20, has attended 3 exercise sessions, will be absent for 11 days after testing positive. Due to several absences she has progressed slowly after testing positive for covid.  Continue to progress as tolerated on equipment.      Expected Outcomes See admission goals. See admission goals.               Exercise Goals and Review:  Exercise Goals     Row Name 10/12/20 1204             Exercise Goals   Increase Physical Activity Yes       Intervention Provide advice, education, support and counseling about physical activity/exercise needs.;Develop an individualized exercise prescription for aerobic and resistive training based on initial evaluation findings, risk stratification, comorbidities and participant's personal goals.       Expected Outcomes Short Term: Attend rehab on a regular basis to increase amount of physical activity.;Long Term: Add in home exercise to make exercise part of routine and to increase amount of physical activity.;Long Term: Exercising regularly at least 3-5 days a week.        Increase Strength and Stamina Yes       Intervention Provide advice, education, support and counseling about physical activity/exercise needs.;Develop an individualized exercise prescription for aerobic and resistive training based on initial evaluation findings, risk stratification, comorbidities and participant's personal goals.       Expected Outcomes Short Term: Increase workloads from initial exercise prescription for resistance, speed, and METs.;Short Term: Perform resistance training exercises routinely during rehab and add in resistance training at home;Long  Term: Improve cardiorespiratory fitness, muscular endurance and strength as measured by increased METs and functional capacity (6MWT)       Able to understand and use rate of perceived exertion (RPE) scale Yes       Intervention Provide education and explanation on how to use RPE scale       Expected Outcomes Short Term: Able to use RPE daily in rehab to express subjective intensity level;Long Term:  Able to use RPE to guide intensity level when exercising independently       Able to understand and use Dyspnea scale Yes       Intervention Provide education and explanation on how to use Dyspnea scale       Expected Outcomes Short Term: Able to use Dyspnea scale daily in rehab to express subjective sense of shortness of breath during exertion;Long Term: Able to use Dyspnea scale to guide intensity level when exercising independently       Knowledge and understanding of Target Heart Rate Range (THRR) Yes       Intervention Provide education and explanation of THRR including how the numbers were predicted and where they are located for reference       Expected Outcomes Short Term: Able to state/look up THRR;Long Term: Able to use THRR to govern intensity when exercising independently;Short Term: Able to use daily as guideline for intensity in rehab       Understanding of Exercise Prescription Yes       Intervention Provide education,  explanation, and written materials on patient's individual exercise prescription       Expected Outcomes Short Term: Able to explain program exercise prescription;Long Term: Able to explain home exercise prescription to exercise independently                Exercise Goals Re-Evaluation:  Exercise Goals Re-Evaluation     Row Name 11/05/20 1632 12/04/20 0722           Exercise Goal Re-Evaluation   Exercise Goals Review Increase Physical Activity;Increase Strength and Stamina;Able to understand and use rate of perceived exertion (RPE) scale;Able to understand and use Dyspnea scale;Knowledge and understanding of Target Heart Rate Range (THRR);Understanding of Exercise Prescription Increase Physical Activity;Increase Strength and Stamina;Able to understand and use rate of perceived exertion (RPE) scale;Able to understand and use Dyspnea scale;Knowledge and understanding of Target Heart Rate Range (THRR);Understanding of Exercise Prescription      Comments Pt has completed 3 exercise sessions and has tolerated well so far with no complaints or concerns. In the short time she has been in the program she has already progressed with MET level increases. She is exercising at 2.2 METS on the Nustep and 2.5 METS on the treadmill. Will continue to monitor and progress as she is able. Shykeria has completed 6 exercise sessions. She was out for a couple of weeks due to having COVID. She has been tolerating exercise well since returning back from having COVID. We will be discussing home exercise with her soon so she can add days of exercise at home if she is not already. She is exercising at 2.1 METS on the Nustep and 3.0 METS on the treadmill. Will continue to monitor and progress as she is able.      Expected Outcomes Through exercise at rehab and home, the patient will decrease shortness of breath with daily activities and feel confident in carying out an exercise regimn at home. Through exercise at rehab and  home, the patient will decrease shortness  of breath with daily activities and feel confident in carying out an exercise regimn at home.               Nutrition & Weight - Outcomes:  Pre Biometrics - 10/12/20 1205       Pre Biometrics   Grip Strength 31 kg   Left Hand            Post Biometrics - 12/27/20 1634        Post  Biometrics   Grip Strength 29 kg             Nutrition:  Nutrition Therapy & Goals - 11/28/20 1444       Nutrition Therapy   Diet Generally Healthful      Personal Nutrition Goals   Nutrition Goal Pt to identify food quantities necessary to achieve weight loss of 6-24 lb at graduation from cardiac rehab.    Personal Goal #2 Pt to build a healthy plate including vegetables, fruits, whole grains, and low-fat dairy products in a healthy meal plan      Intervention Plan   Intervention Prescribe, educate and counsel regarding individualized specific dietary modifications aiming towards targeted core components such as weight, hypertension, lipid management, diabetes, heart failure and other comorbidities.    Expected Outcomes Short Term Goal: Understand basic principles of dietary content, such as calories, fat, sodium, cholesterol and nutrients.;Long Term Goal: Adherence to prescribed nutrition plan.             Nutrition Discharge:   Education Questionnaire Score:  Knowledge Questionnaire Score - 10/12/20 1216       Knowledge Questionnaire Score   Pre Score 16/18             Goals reviewed with patient; copy given to patient.

## 2021-01-16 ENCOUNTER — Other Ambulatory Visit (HOSPITAL_BASED_OUTPATIENT_CLINIC_OR_DEPARTMENT_OTHER): Payer: Self-pay | Admitting: Emergency Medicine

## 2021-01-16 DIAGNOSIS — J8481 Lymphangioleiomyomatosis: Secondary | ICD-10-CM

## 2021-01-17 ENCOUNTER — Ambulatory Visit (HOSPITAL_BASED_OUTPATIENT_CLINIC_OR_DEPARTMENT_OTHER): Payer: Managed Care, Other (non HMO)

## 2021-01-17 ENCOUNTER — Inpatient Hospital Stay: Admission: RE | Admit: 2021-01-17 | Payer: Managed Care, Other (non HMO) | Source: Ambulatory Visit

## 2021-01-25 ENCOUNTER — Other Ambulatory Visit: Payer: Self-pay

## 2021-01-25 ENCOUNTER — Telehealth: Payer: Self-pay | Admitting: Emergency Medicine

## 2021-01-25 ENCOUNTER — Other Ambulatory Visit: Payer: Self-pay | Admitting: Family Medicine

## 2021-01-25 ENCOUNTER — Emergency Department (HOSPITAL_BASED_OUTPATIENT_CLINIC_OR_DEPARTMENT_OTHER): Payer: Managed Care, Other (non HMO)

## 2021-01-25 ENCOUNTER — Emergency Department (HOSPITAL_BASED_OUTPATIENT_CLINIC_OR_DEPARTMENT_OTHER)
Admission: EM | Admit: 2021-01-25 | Discharge: 2021-01-25 | Disposition: A | Discharge status: Home / Self Care | Payer: Managed Care, Other (non HMO) | Attending: Emergency Medicine | Admitting: Emergency Medicine

## 2021-01-25 ENCOUNTER — Other Ambulatory Visit: Payer: Managed Care, Other (non HMO)

## 2021-01-25 ENCOUNTER — Encounter (HOSPITAL_BASED_OUTPATIENT_CLINIC_OR_DEPARTMENT_OTHER): Payer: Self-pay | Admitting: *Deleted

## 2021-01-25 DIAGNOSIS — J4520 Mild intermittent asthma, uncomplicated: Secondary | ICD-10-CM | POA: Diagnosis not present

## 2021-01-25 DIAGNOSIS — R0602 Shortness of breath: Secondary | ICD-10-CM | POA: Insufficient documentation

## 2021-01-25 DIAGNOSIS — Z7951 Long term (current) use of inhaled steroids: Secondary | ICD-10-CM | POA: Diagnosis not present

## 2021-01-25 DIAGNOSIS — K1120 Sialoadenitis, unspecified: Secondary | ICD-10-CM

## 2021-01-25 DIAGNOSIS — R079 Chest pain, unspecified: Secondary | ICD-10-CM | POA: Insufficient documentation

## 2021-01-25 LAB — COMPREHENSIVE METABOLIC PANEL
ALT: 13 U/L (ref 0–44)
AST: 12 U/L — ABNORMAL LOW (ref 15–41)
Albumin: 4 g/dL (ref 3.5–5.0)
Alkaline Phosphatase: 59 U/L (ref 38–126)
Anion gap: 7 (ref 5–15)
BUN: 10 mg/dL (ref 6–20)
CO2: 25 mmol/L (ref 22–32)
Calcium: 9.1 mg/dL (ref 8.9–10.3)
Chloride: 106 mmol/L (ref 98–111)
Creatinine, Ser: 0.67 mg/dL (ref 0.44–1.00)
GFR, Estimated: >60 mL/min (ref >60)
Glucose, Bld: 95 mg/dL (ref 70–99)
Potassium: 3.5 mmol/L (ref 3.5–5.1)
Sodium: 138 mmol/L (ref 135–145)
Total Bilirubin: 0.3 mg/dL (ref 0.3–1.2)
Total Protein: 7.6 g/dL (ref 6.5–8.1)

## 2021-01-25 LAB — CBC WITH DIFFERENTIAL/PLATELET
Abs Immature Granulocytes: 0.02 10*3/uL (ref 0.00–0.07)
Basophils Absolute: 0 10*3/uL (ref 0.0–0.1)
Basophils Relative: 1 %
Eosinophils Absolute: 0 10*3/uL (ref 0.0–0.5)
Eosinophils Relative: 1 %
HCT: 41.2 % (ref 36.0–46.0)
Hemoglobin: 13.5 g/dL (ref 12.0–15.0)
Immature Granulocytes: 0 %
Lymphocytes Relative: 30 %
Lymphs Abs: 2.2 10*3/uL (ref 0.7–4.0)
MCH: 28.6 pg (ref 26.0–34.0)
MCHC: 32.8 g/dL (ref 30.0–36.0)
MCV: 87.3 fL (ref 80.0–100.0)
Monocytes Absolute: 0.5 10*3/uL (ref 0.1–1.0)
Monocytes Relative: 6 %
Neutro Abs: 4.5 10*3/uL (ref 1.7–7.7)
Neutrophils Relative %: 62 %
Platelets: 389 10*3/uL (ref 150–400)
RBC: 4.72 MIL/uL (ref 3.87–5.11)
RDW: 14.7 % (ref 11.5–15.5)
WBC: 7.2 10*3/uL (ref 4.0–10.5)
nRBC: 0 % (ref 0.0–0.2)

## 2021-01-25 LAB — TROPONIN I (HIGH SENSITIVITY)
Troponin I (High Sensitivity): 5 ng/L (ref <18)
Troponin I (High Sensitivity): 3 ng/L (ref <18)

## 2021-01-25 MED ORDER — PREDNISONE 10 MG PO TABS: ORAL_TABLET | ORAL | 0 refills | Status: AC

## 2021-01-25 MED ORDER — PREDNISONE 50 MG PO TABS
60.0000 mg | ORAL_TABLET | Freq: Once | ORAL | Status: AC
  Administered 2021-01-25: 60 mg via ORAL
  Filled 2021-01-25: qty 1

## 2021-01-25 MED ORDER — AZITHROMYCIN 250 MG PO TABS: 250.0000 mg | ORAL_TABLET | Freq: Every day | ORAL | 0 refills | Status: DC

## 2021-01-25 NOTE — Telephone Encounter (Signed)
Pt stated that she is having difficulty breathing and stated that her chest is tight and stated that she used her rescue inhaler about 15 minutes ago but it is not helping her. Stated she is able to talk and function but is still feeling tired; stated that last night she was nauseous a little bit and was having trouble breathing but she stated that right now it is really bothering her and has progressed throughout the day.  Pharmacy; [place] ([place]), [place] [address]; [phone]

## 2021-01-25 NOTE — Telephone Encounter (Signed)
She needs to be seen in ED with a CXR STAT >> her disease puts her at high risk for pneumothorax. Tell her to go to nearest ED now.

## 2021-01-25 NOTE — Discharge Instructions (Addendum)
  Prednisone: Take the prednisone, as prescribed, until finished. If you are a diabetic, please know prednisone can raise your blood sugar temporarily.  Please take all of your antibiotics until finished!   You may develop abdominal discomfort or diarrhea from the antibiotic.  You may help offset this with probiotics which you can buy or get in yogurt. Do not eat or take the probiotics until 2 hours after your antibiotic.   Follow-up with the primary care provider or pulmonologist on this matter. Return: Return to the emergency department for worsening shortness of breath, chest pain, dizziness, passing out, or any other major concerns.

## 2021-01-25 NOTE — ED Triage Notes (Signed)
C/o SOB for the past week but it has increased greatly over the past 24 hours. She has also been having some sharp left sided pain, underneath her left breast. Increased chest tightness. Increased fatigue. She has been nauseated.   She has not been coughing. Denied any fevers. Denied any numbness or tingling in her arms. No headaches.

## 2021-01-25 NOTE — Telephone Encounter (Signed)
Called and spoke with patient. She stated that she has been having problems with SOB for the past week but it has increased greatly over the past 24 hours. She has also been having some sharp left sided pain, underneath her left breast. Increased chest tightness. Increased fatigue. She has been nauseated.   She has not been coughing. Denied any fevers. Denied any numbness or tingling in her arms. No headaches.   She has been using her albuterol inhaler without relief.   I advised her that I was concerned about the symptoms she is having and it may be heart related. I advised her to call 911 but she wanted recommendations from Dr. Lamonte Sakai first.   RB, can you please advise? Thanks.

## 2021-01-25 NOTE — ED Notes (Signed)
Pt d/c home per MD order. Discharge summary reviewed with pt, pt verbalizes understanding. No s/s of acute distress noted at discharge. Ambulatory off unit.

## 2021-01-25 NOTE — Progress Notes (Signed)
f °

## 2021-01-25 NOTE — ED Provider Notes (Signed)
[facility] EMERGENCY DEPARTMENT Provider Note   CSN: [id number] Arrival date & time: 01/25/21  1533     History Chief Complaint  Patient presents with   Shortness of Breath    Brenda Williams is a 40 y.o. female.   Shortness of Breath Associated symptoms: chest pain   Associated symptoms: no abdominal pain, no diaphoresis, no fever and no vomiting       Brenda Williams is a 40 y.o. female, with a history of anxiety, lymphangiomatosis, presenting to the ED with shortness of breath.  And she endorses worsened shortness of breath over the past couple days. Intermittent, momentary, sharp pain under the left breast.  Recent antibiotics were amoxicillin she was given for 10 days to treat suspected isolated lymphadenopathy.  She finished this course about a week ago. Denies fever/chills, other chest discomfort, constant shortness of breath, abdominal pain, neurologic deficits, dizziness, syncope, increased cough, or any other complaints.  Past Medical History:  Diagnosis Date   Abnormal Pap smear 2006   leep   Anxiety    Arthritis    osteoarthritis   Asthma    Complication of anesthesia    [facility]c [facility], HAD ??  SEIZURE/ SHAKING   Cough    Depression    Fatigue    GERD (gastroesophageal reflux disease)    Herpes    never had outbreak. pos per blood, doesn't know which type   Lactose intolerance    Lymphangiomatosis    Polycystic ovarian syndrome    Prediabetes    Sleep apnea    uses CPAP   SVD (spontaneous vaginal delivery)    x 1   Varicose vein of leg    Vitamin D deficiency     Patient Active Problem List   Diagnosis Date Noted   Pulmonary emphysema, unspecified emphysema type ([place]) 12/07/2020   Dyspnea on exertion 08/03/2020   Obstructive sleep apnea 03/08/2020   MDD (major depressive disorder), recurrent episode, severe ([place]) 11/18/2017   Mild intermittent asthma 06/12/2017   Depression 03/17/2017   Vitamin D deficiency 03/17/2017    Class 2 obesity with serious comorbidity and body mass index (BMI) of 35.0 to 35.9 in adult 03/17/2017   Prediabetes 02/23/2017   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   Obesity 01/12/2015   Chronic fatigue 12/01/2014   Pulmonary nodules 06/28/2013   Cervix abnormality 05/12/2011   Polycystic ovaries 05/12/2011   Female infertility of unspecified origin 05/12/2011    Past Surgical History:  Procedure Laterality Date   DILATION AND EVACUATION N/A 09/14/2012   Procedure: DILATATION AND EVACUATION;  Surgeon: Allena Katz, MD;  Location: [facility];  Service: Gynecology;  Laterality: N/A;   HYSTEROSCOPY  2009   uterine polyp   IVF Retrival  2010, 2011   [place]   right knee   LEEP  2006   LIPOSUCTION  2021   LUNG BIOPSY Right 12/28/2015   Procedure: RIGHT LUNG BIOPSY;  Surgeon: Ivin Poot, MD;  Location: [facility];  Service: Thoracic;  Laterality: Right;   SPHINCTEROTOMY  2010   tummy tuck  11/12/2019   uterine polyp removal     VIDEO ASSISTED THORACOSCOPY Right 12/28/2015   Procedure: RIGHT VIDEO ASSISTED THORACOSCOPY;  Surgeon: Ivin Poot, MD;  Location: [facility];  Service: Thoracic;  Laterality: Right;     OB History     Gravida  5   Para  1   Term  1   Preterm  0  AB  2   Living  1      SAB  1   IAB  0   Ectopic  1   Multiple  0   Live Births  1           Family History  Problem Relation Age of Onset   Diabetes Maternal Grandmother    Heart disease Maternal Grandmother    Hypertension Maternal Grandfather    Cancer Maternal Grandfather 60       bone, colon    Diabetes Maternal Grandfather    Colon cancer Maternal Grandfather    Diabetes Mother    Cancer Mother 36       breast cancer   Hyperlipidemia Mother    Depression Mother    Obesity Mother    Hypertension Father    Hyperlipidemia Father    Thyroid disease Father    Alcohol abuse Father    Drug abuse Father    Obesity Father    Anesthesia problems Neg Hx     Colon polyps Neg Hx    Esophageal cancer Neg Hx    Stomach cancer Neg Hx     Social History   Tobacco Use   Smoking status: Never   Smokeless tobacco: Never  Vaping Use   Vaping Use: Never used  Substance Use Topics   Alcohol use: Not Currently    Alcohol/week: 0.0 standard drinks   Drug use: No    Home Medications Prior to Admission medications   Medication Sig Start Date End Date Taking? Authorizing Provider  azithromycin (ZITHROMAX) 250 MG tablet Take 1 tablet (250 mg total) by mouth daily. Take first 2 tablets together, then 1 every day until finished. 01/25/21  Yes Jb Dulworth C, PA-C  predniSONE (DELTASONE) 10 MG tablet Take 4 tablets (40 mg total) by mouth daily for 5 days, THEN 3 tablets (30 mg total) daily for 1 day, THEN 2 tablets (20 mg total) daily for 1 day, THEN 1 tablet (10 mg total) daily for 1 day. 01/25/21 02/02/21 Yes Oneil Behney C, PA-C  albuterol (VENTOLIN HFA) 108 (90 Base) MCG/ACT inhaler Inhale 2 puffs into the lungs every 4 (four) hours as needed for wheezing or shortness of breath. 09/30/19   Collene Gobble, MD  budesonide-formoterol (SYMBICORT) 80-4.5 MCG/ACT inhaler Inhale 2 puffs into the lungs in the morning and at bedtime. 07/11/20   Collene Gobble, MD  Dulaglutide (TRULICITY) 1.5 XL/2.4MW SOPN Inject 1.5 mg into the skin once a week. 01/15/21   Shelda Pal, DO  fluticasone (FLONASE) 50 MCG/ACT nasal spray Place 1-2 sprays into both nostrils daily. 09/25/20   Wieters, Hallie C, PA-C  ibuprofen (ADVIL) 600 MG tablet Take 1 tablet (600 mg total) by mouth every 6 (six) hours as needed. 09/25/20   Wieters, Hallie C, PA-C  pantoprazole (PROTONIX) 40 MG tablet Take 1 tablet (40 mg total) by mouth daily. 06/13/20   Shelda Pal, DO  rosuvastatin (CRESTOR) 20 MG tablet Take 1 tablet (20 mg total) by mouth daily. 12/14/20   Shelda Pal, DO  sirolimus (RAPAMUNE) 1 MG tablet Take 1 tablet (1 mg total) by mouth 2 (two) times daily. 03/24/20    Collene Gobble, MD  phentermine (ADIPEX-P) 37.5 MG tablet Take 1 tablet (37.5 mg total) by mouth daily before breakfast. 01/08/18 06/02/19  Shelda Pal, DO    Allergies    Fentanyl  Review of Systems   Review of Systems  Constitutional:  Negative for  chills, diaphoresis and fever.  Respiratory:  Positive for shortness of breath.   Cardiovascular:  Positive for chest pain. Negative for leg swelling.  Gastrointestinal:  Negative for abdominal pain, diarrhea, nausea and vomiting.  Neurological:  Negative for dizziness, syncope and weakness.  All other systems reviewed and are negative.  Physical Exam Updated Vital Signs BP 123/85   Pulse 72   Temp 98.6 F (37 C) (Oral)   Resp 13   Ht 5\' 7"  (1.702 m)   Wt 108.9 kg   LMP 12/27/2020   SpO2 98%   BMI 37.59 kg/m   Physical Exam Vitals and nursing note reviewed.  Constitutional:      General: She is not in acute distress.    Appearance: She is well-developed. She is not diaphoretic.  HENT:     Head: Normocephalic and atraumatic.     Mouth/Throat:     Mouth: Mucous membranes are moist.     Pharynx: Oropharynx is clear.  Eyes:     Conjunctiva/sclera: Conjunctivae normal.  Cardiovascular:     Rate and Rhythm: Normal rate and regular rhythm.     Pulses: Normal pulses.          Radial pulses are 2+ on the right side and 2+ on the left side.       Posterior tibial pulses are 2+ on the right side and 2+ on the left side.     Heart sounds: Normal heart sounds.     Comments: Tactile temperature in the extremities appropriate and equal bilaterally. Pulmonary:     Effort: Pulmonary effort is normal. No respiratory distress.     Breath sounds: Normal breath sounds.     Comments: No increased work of breathing.  Speaks in full sentences without difficulty. Abdominal:     Palpations: Abdomen is soft.     Tenderness: There is no abdominal tenderness. There is no guarding.  Musculoskeletal:     Cervical back: Neck supple.      Right lower leg: No edema.     Left lower leg: No edema.  Lymphadenopathy:     Cervical: No cervical adenopathy.  Skin:    General: Skin is warm and dry.  Neurological:     Mental Status: She is alert.  Psychiatric:        Mood and Affect: Mood and affect normal.        Speech: Speech normal.        Behavior: Behavior normal.    ED Results / Procedures / Treatments   Labs (all labs ordered are listed, but only abnormal results are displayed) Labs Reviewed  COMPREHENSIVE METABOLIC PANEL - Abnormal; Notable for the following components:      Result Value   AST 12 (*)    All other components within normal limits  CBC WITH DIFFERENTIAL/PLATELET  TROPONIN I (HIGH SENSITIVITY)  TROPONIN I (HIGH SENSITIVITY)    EKG EKG Interpretation  Date/Time:  Friday January 25 2021 15:47:08 EDT Ventricular Rate:  79 PR Interval:  156 QRS Duration: 86 QT Interval:  366 QTC Calculation: 419 R Axis:   50 Text Interpretation: Normal sinus rhythm Nonspecific T wave abnormality Abnormal ECG No significant change since last tracing Confirmed by Dorie Rank [phone]) on 01/25/2021 3:51:47 PM  Radiology DG Chest 2 View  Result Date: 01/25/2021 CLINICAL DATA:  Chest pain, shortness of breath EXAM: CHEST - 2 VIEW COMPARISON:  10/31/2020 FINDINGS: Mild peribronchial thickening and interstitial prominence. Heart is normal size. No effusions or acute bony  abnormality. IMPRESSION: Peribronchial thickening and interstitial prominence could reflect bronchitic changes or atypical infection. Electronically Signed   By: Rolm Baptise M.D.   On: 01/25/2021 16:09    Procedures Procedures   Medications Ordered in ED Medications  predniSONE (DELTASONE) tablet 60 mg (60 mg Oral Given 01/25/21 1920)    ED Course  I have reviewed the triage vital signs and the nursing notes.  Pertinent labs & imaging results that were available during my care of the patient were reviewed by me and considered in my medical  decision making (see chart for details).    MDM Rules/Calculators/A&P                          Patient presents with complaint of shortness of breath. Patient is nontoxic appearing, afebrile, not tachycardic, not tachypneic, not hypotensive, maintains excellent SPO2 on room air, and is in no apparent distress.   I have reviewed the patient's chart to obtain more information.   I reviewed and interpreted the patient's labs and radiological studies. No leukocytosis.  Delta troponin is negative.  No evidence of acute ischemia on EKG. Chest x-ray indicates peribronchial thickening and interstitial prominence.  They question possible acute infection.  Given the patient's chronic lung disease, this may also reflect changes in this disease. She is scheduled for a CT of the chest in a few days, ordered by pulmonology. We will cover her for a possible atypical infection as a precaution, however, given the reassuring findings on today's work-up we ultimately recommend she follows up with her pulmonologist on this matter.  Vitals:   01/25/21 1700 01/25/21 1715 01/25/21 1730 01/25/21 1800  BP: 128/90 123/85 135/84 (!) 138/93  Pulse: 77 72 72 76  Resp: 14 13 11 18   Temp:      TempSrc:      SpO2: 97% 98% 97% 98%  Weight:      Height:         Final Clinical Impression(s) / ED Diagnoses Final diagnoses:  SOB (shortness of breath)    Rx / DC Orders ED Discharge Orders          Ordered    predniSONE (DELTASONE) 10 MG tablet        01/25/21 1915    azithromycin (ZITHROMAX) 250 MG tablet  Daily        01/25/21 1915             Layla Maw 01/25/21 1947    Dorie Rank, MD 01/27/21 [phone]

## 2021-01-25 NOTE — Telephone Encounter (Signed)
Called and spoke with patient. I advised her of RB's recommendations. She verbalized understanding. She will call 911 as I advised her to not drive due to her symptoms.   Nothing further needed at time of call.

## 2021-01-29 ENCOUNTER — Other Ambulatory Visit: Payer: Self-pay

## 2021-01-29 ENCOUNTER — Ambulatory Visit (INDEPENDENT_AMBULATORY_CARE_PROVIDER_SITE_OTHER)
Admission: RE | Admit: 2021-01-29 | Discharge: 2021-01-29 | Disposition: A | Discharge status: Home / Self Care | Payer: Managed Care, Other (non HMO) | Source: Ambulatory Visit | Attending: Emergency Medicine | Admitting: Emergency Medicine

## 2021-01-29 DIAGNOSIS — J8481 Lymphangioleiomyomatosis: Secondary | ICD-10-CM

## 2021-02-21 ENCOUNTER — Other Ambulatory Visit: Payer: Self-pay | Admitting: Emergency Medicine

## 2021-02-21 NOTE — Telephone Encounter (Signed)
Dr. Byrum, please advise on refill request. 

## 2021-04-24 DIAGNOSIS — Z6837 Body mass index (BMI) 37.0-37.9, adult: Secondary | ICD-10-CM | POA: Diagnosis not present

## 2021-04-24 DIAGNOSIS — E119 Type 2 diabetes mellitus without complications: Secondary | ICD-10-CM | POA: Diagnosis not present

## 2021-04-24 DIAGNOSIS — G4733 Obstructive sleep apnea (adult) (pediatric): Secondary | ICD-10-CM | POA: Diagnosis not present

## 2021-05-06 DIAGNOSIS — Z630 Problems in relationship with spouse or partner: Secondary | ICD-10-CM | POA: Diagnosis not present

## 2021-05-06 DIAGNOSIS — F411 Generalized anxiety disorder: Secondary | ICD-10-CM | POA: Diagnosis not present

## 2021-05-14 DIAGNOSIS — F411 Generalized anxiety disorder: Secondary | ICD-10-CM | POA: Diagnosis not present

## 2021-05-14 DIAGNOSIS — Z630 Problems in relationship with spouse or partner: Secondary | ICD-10-CM | POA: Diagnosis not present

## 2021-05-28 DIAGNOSIS — Z01818 Encounter for other preprocedural examination: Secondary | ICD-10-CM | POA: Diagnosis not present

## 2021-05-28 DIAGNOSIS — E669 Obesity, unspecified: Secondary | ICD-10-CM | POA: Diagnosis not present

## 2021-05-28 DIAGNOSIS — G4733 Obstructive sleep apnea (adult) (pediatric): Secondary | ICD-10-CM | POA: Diagnosis not present

## 2021-05-28 DIAGNOSIS — R0609 Other forms of dyspnea: Secondary | ICD-10-CM | POA: Diagnosis not present

## 2021-05-28 DIAGNOSIS — Z713 Dietary counseling and surveillance: Secondary | ICD-10-CM | POA: Diagnosis not present

## 2021-05-28 DIAGNOSIS — E119 Type 2 diabetes mellitus without complications: Secondary | ICD-10-CM | POA: Diagnosis not present

## 2021-05-28 DIAGNOSIS — K219 Gastro-esophageal reflux disease without esophagitis: Secondary | ICD-10-CM | POA: Diagnosis not present

## 2021-06-13 HISTORY — PX: ROUX-EN-Y GASTRIC BYPASS: SHX1104

## 2021-06-19 DIAGNOSIS — Z01818 Encounter for other preprocedural examination: Secondary | ICD-10-CM | POA: Diagnosis not present

## 2021-06-20 DIAGNOSIS — Z01818 Encounter for other preprocedural examination: Secondary | ICD-10-CM | POA: Diagnosis not present

## 2021-06-24 DIAGNOSIS — E282 Polycystic ovarian syndrome: Secondary | ICD-10-CM | POA: Diagnosis not present

## 2021-06-24 DIAGNOSIS — E119 Type 2 diabetes mellitus without complications: Secondary | ICD-10-CM | POA: Diagnosis not present

## 2021-06-24 DIAGNOSIS — J45909 Unspecified asthma, uncomplicated: Secondary | ICD-10-CM | POA: Diagnosis not present

## 2021-06-24 DIAGNOSIS — E7800 Pure hypercholesterolemia, unspecified: Secondary | ICD-10-CM | POA: Insufficient documentation

## 2021-06-24 DIAGNOSIS — R0609 Other forms of dyspnea: Secondary | ICD-10-CM | POA: Diagnosis not present

## 2021-06-24 DIAGNOSIS — K219 Gastro-esophageal reflux disease without esophagitis: Secondary | ICD-10-CM | POA: Diagnosis present

## 2021-06-24 DIAGNOSIS — Z6837 Body mass index (BMI) 37.0-37.9, adult: Secondary | ICD-10-CM | POA: Diagnosis not present

## 2021-06-24 DIAGNOSIS — F419 Anxiety disorder, unspecified: Secondary | ICD-10-CM | POA: Diagnosis not present

## 2021-06-24 DIAGNOSIS — G4733 Obstructive sleep apnea (adult) (pediatric): Secondary | ICD-10-CM | POA: Diagnosis not present

## 2021-06-24 DIAGNOSIS — E559 Vitamin D deficiency, unspecified: Secondary | ICD-10-CM | POA: Diagnosis not present

## 2021-06-27 ENCOUNTER — Telehealth: Payer: Self-pay | Admitting: Emergency Medicine

## 2021-06-27 NOTE — Telephone Encounter (Signed)
Pt recently had surgery(on Monday-released yesterday), states it's affecting her breathing, especially when sleeping or after taking pain medicine(hydrocodone).Pt states her oxygen sat drops to 69% or mid 70%'s when the oxygen drops(husband monitored when sleeps.) Pt spouse wanting to know if it's safe for pt to use albuterol inhaler when pt's drops that low. Please advise [phone]

## 2021-06-27 NOTE — Telephone Encounter (Signed)
Left message for patient to call back  

## 2021-06-28 ENCOUNTER — Other Ambulatory Visit: Payer: Self-pay

## 2021-06-28 ENCOUNTER — Encounter (HOSPITAL_BASED_OUTPATIENT_CLINIC_OR_DEPARTMENT_OTHER): Payer: Self-pay

## 2021-06-28 ENCOUNTER — Emergency Department (HOSPITAL_BASED_OUTPATIENT_CLINIC_OR_DEPARTMENT_OTHER)
Admission: EM | Admit: 2021-06-28 | Discharge: 2021-06-28 | Disposition: A | Discharge status: Home / Self Care | Payer: BC Managed Care – PPO | Attending: Emergency Medicine | Admitting: Emergency Medicine

## 2021-06-28 DIAGNOSIS — J4520 Mild intermittent asthma, uncomplicated: Secondary | ICD-10-CM | POA: Insufficient documentation

## 2021-06-28 DIAGNOSIS — K921 Melena: Secondary | ICD-10-CM | POA: Diagnosis not present

## 2021-06-28 LAB — CBC
HCT: 36.6 % (ref 36.0–46.0)
Hemoglobin: 12.1 g/dL (ref 12.0–15.0)
MCH: 28.9 pg (ref 26.0–34.0)
MCHC: 33.1 g/dL (ref 30.0–36.0)
MCV: 87.4 fL (ref 80.0–100.0)
Platelets: 338 10*3/uL (ref 150–400)
RBC: 4.19 MIL/uL (ref 3.87–5.11)
RDW: 14.2 % (ref 11.5–15.5)
WBC: 7.6 10*3/uL (ref 4.0–10.5)
nRBC: 0 % (ref 0.0–0.2)

## 2021-06-28 LAB — COMPREHENSIVE METABOLIC PANEL
ALT: 45 U/L — ABNORMAL HIGH (ref 0–44)
AST: 19 U/L (ref 15–41)
Albumin: 4.1 g/dL (ref 3.5–5.0)
Alkaline Phosphatase: 48 U/L (ref 38–126)
Anion gap: 11 (ref 5–15)
BUN: 8 mg/dL (ref 6–20)
CO2: 21 mmol/L — ABNORMAL LOW (ref 22–32)
Calcium: 9.4 mg/dL (ref 8.9–10.3)
Chloride: 103 mmol/L (ref 98–111)
Creatinine, Ser: 0.54 mg/dL (ref 0.44–1.00)
GFR, Estimated: >60 mL/min (ref >60)
Glucose, Bld: 82 mg/dL (ref 70–99)
Potassium: 3.5 mmol/L (ref 3.5–5.1)
Sodium: 135 mmol/L (ref 135–145)
Total Bilirubin: 0.5 mg/dL (ref 0.3–1.2)
Total Protein: 7.3 g/dL (ref 6.5–8.1)

## 2021-06-28 LAB — OCCULT BLOOD X 1 CARD TO LAB, STOOL: Fecal Occult Bld: POSITIVE — AB

## 2021-06-28 MED ORDER — ACETAMINOPHEN 325 MG PO TABS
650.0000 mg | ORAL_TABLET | Freq: Once | ORAL | Status: AC
  Administered 2021-06-28: 650 mg via ORAL
  Filled 2021-06-28: qty 2

## 2021-06-28 MED ORDER — PANTOPRAZOLE SODIUM 40 MG PO TBEC: 40.0000 mg | DELAYED_RELEASE_TABLET | Freq: Every day | ORAL | 0 refills | Status: DC

## 2021-06-28 MED ORDER — PANTOPRAZOLE SODIUM 40 MG PO TBEC
40.0000 mg | DELAYED_RELEASE_TABLET | Freq: Once | ORAL | Status: AC
  Administered 2021-06-28: 40 mg via ORAL
  Filled 2021-06-28: qty 1

## 2021-06-28 NOTE — ED Provider Notes (Signed)
[facility] EMERGENCY DEPT Provider Note   CSN: [id number] Arrival date & time: 06/28/21  1813     History Chief Complaint  Patient presents with   Melena    Brenda Williams is a 40 y.o. female.  Pt presents to the ED today with black stool.  Pt had a gastric bypass on Monday, 12/12.  She was d/c on the 14th.  She had some black stool yesterday and today.  She did call her doctor and was told to come to the ED for further eval.  She is not taking any iron pills or pepto.  She did take gas-x.  She said she has a little soreness to her abdomen, but feels ok.      Past Medical History:  Diagnosis Date   Abnormal Pap smear 2006   leep   Anxiety    Arthritis    osteoarthritis   Asthma    Complication of anesthesia    [facility], HAD ??  SEIZURE/ SHAKING   Cough    Depression    Fatigue    GERD (gastroesophageal reflux disease)    Herpes    never had outbreak. pos per blood, doesn't know which type   Lactose intolerance    Lymphangiomatosis    Polycystic ovarian syndrome    Prediabetes    Sleep apnea    uses CPAP   SVD (spontaneous vaginal delivery)    x 1   Varicose vein of leg    Vitamin D deficiency     Patient Active Problem List   Diagnosis Date Noted   Pulmonary emphysema, unspecified emphysema type ([place]) 12/07/2020   Dyspnea on exertion 08/03/2020   Obstructive sleep apnea 03/08/2020   MDD (major depressive disorder), recurrent episode, severe ([place]) 11/18/2017   Mild intermittent asthma 06/12/2017   Depression 03/17/2017   Vitamin D deficiency 03/17/2017   Class 2 obesity with serious comorbidity and body mass index (BMI) of 35.0 to 35.9 in adult 03/17/2017   Prediabetes 02/23/2017   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   Obesity 01/12/2015   Chronic fatigue 12/01/2014   Pulmonary nodules 06/28/2013   Cervix abnormality 05/12/2011   Polycystic ovaries 05/12/2011   Female infertility of unspecified origin 05/12/2011     Past Surgical History:  Procedure Laterality Date   DILATION AND EVACUATION N/A 09/14/2012   Procedure: DILATATION AND EVACUATION;  Surgeon: Allena Katz, MD;  Location: [facility];  Service: Gynecology;  Laterality: N/A;   HYSTEROSCOPY  2009   uterine polyp   IVF Retrival  2010, 2011   [place]   right knee   LEEP  2006   LIPOSUCTION  2021   LUNG BIOPSY Right 12/28/2015   Procedure: RIGHT LUNG BIOPSY;  Surgeon: Ivin Poot, MD;  Location: [facility];  Service: Thoracic;  Laterality: Right;   SPHINCTEROTOMY  2010   tummy tuck  11/12/2019   uterine polyp removal     VIDEO ASSISTED THORACOSCOPY Right 12/28/2015   Procedure: RIGHT VIDEO ASSISTED THORACOSCOPY;  Surgeon: Ivin Poot, MD;  Location: [facility] OR;  Service: Thoracic;  Laterality: Right;     OB History     Gravida  5   Para  1   Term  1   Preterm  0   AB  2   Living  1      SAB  1   IAB  0   Ectopic  1   Multiple  0   Live  Births  1           Family History  Problem Relation Age of Onset   Diabetes Maternal Grandmother    Heart disease Maternal Grandmother    Hypertension Maternal Grandfather    Cancer Maternal Grandfather 68       bone, colon    Diabetes Maternal Grandfather    Colon cancer Maternal Grandfather    Diabetes Mother    Cancer Mother 74       breast cancer   Hyperlipidemia Mother    Depression Mother    Obesity Mother    Hypertension Father    Hyperlipidemia Father    Thyroid disease Father    Alcohol abuse Father    Drug abuse Father    Obesity Father    Anesthesia problems Neg Hx    Colon polyps Neg Hx    Esophageal cancer Neg Hx    Stomach cancer Neg Hx     Social History   Tobacco Use   Smoking status: Never   Smokeless tobacco: Never  Vaping Use   Vaping Use: Never used  Substance Use Topics   Alcohol use: Not Currently    Alcohol/week: 0.0 standard drinks   Drug use: No    Home Medications Prior to Admission medications   Medication Sig  Start Date End Date Taking? Authorizing Provider  pantoprazole (PROTONIX) 40 MG tablet Take 1 tablet (40 mg total) by mouth daily. 06/28/21  Yes Isla Pence, MD  albuterol (VENTOLIN HFA) 108 (90 Base) MCG/ACT inhaler Inhale 2 puffs into the lungs every 4 (four) hours as needed for wheezing or shortness of breath. 09/30/19   Collene Gobble, MD  azithromycin (ZITHROMAX) 250 MG tablet Take 1 tablet (250 mg total) by mouth daily. Take first 2 tablets together, then 1 every day until finished. 01/25/21   Joy, Shawn C, PA-C  budesonide-formoterol (SYMBICORT) 80-4.5 MCG/ACT inhaler Inhale 2 puffs into the lungs in the morning and at bedtime. 07/11/20   Collene Gobble, MD  Dulaglutide (TRULICITY) 1.5 KK/9.3GH SOPN Inject 1.5 mg into the skin once a week. 01/15/21   Shelda Pal, DO  fluticasone (FLONASE) 50 MCG/ACT nasal spray Place 1-2 sprays into both nostrils daily. 09/25/20   Wieters, Hallie C, PA-C  rosuvastatin (CRESTOR) 20 MG tablet Take 1 tablet (20 mg total) by mouth daily. 12/14/20   Shelda Pal, DO  sirolimus (RAPAMUNE) 1 MG tablet Take 1 tablet (1 mg total) by mouth 2 (two) times daily. 03/24/20   Collene Gobble, MD  phentermine (ADIPEX-P) 37.5 MG tablet Take 1 tablet (37.5 mg total) by mouth daily before breakfast. 01/08/18 06/02/19  Shelda Pal, DO    Allergies    Fentanyl  Review of Systems   Review of Systems  Gastrointestinal:        Black stool  All other systems reviewed and are negative.  Physical Exam Updated Vital Signs BP (!) 143/94 (BP Location: Right Arm)    Pulse 71    Temp 97.8 F (36.6 C) (Oral)    Resp 12    Ht 5\' 7"  (1.702 m)    Wt 108.9 kg    SpO2 98%    BMI 37.60 kg/m   Physical Exam Vitals and nursing note reviewed.  Constitutional:      Appearance: Normal appearance.  HENT:     Head: Normocephalic and atraumatic.     Right Ear: External ear normal.     Left Ear: External ear normal.  Nose: Nose normal.     Mouth/Throat:      Mouth: Mucous membranes are moist.     Pharynx: Oropharynx is clear.  Eyes:     Extraocular Movements: Extraocular movements intact.     Conjunctiva/sclera: Conjunctivae normal.     Pupils: Pupils are equal, round, and reactive to light.  Cardiovascular:     Rate and Rhythm: Normal rate and regular rhythm.     Pulses: Normal pulses.     Heart sounds: Normal heart sounds.  Pulmonary:     Effort: Pulmonary effort is normal.     Breath sounds: Normal breath sounds.  Abdominal:     General: Abdomen is flat. Bowel sounds are normal.     Palpations: Abdomen is soft.  Genitourinary:    Rectum: Guaiac result positive.     Comments: Stool is melanotic in appearance Musculoskeletal:        General: Normal range of motion.     Cervical back: Normal range of motion and neck supple.  Skin:    General: Skin is warm.     Capillary Refill: Capillary refill takes less than 2 seconds.  Neurological:     General: No focal deficit present.     Mental Status: She is alert and oriented to person, place, and time.  Psychiatric:        Mood and Affect: Mood normal.        Behavior: Behavior normal.    ED Results / Procedures / Treatments   Labs (all labs ordered are listed, but only abnormal results are displayed) Labs Reviewed  COMPREHENSIVE METABOLIC PANEL - Abnormal; Notable for the following components:      Result Value   CO2 21 (*)    ALT 45 (*)    All other components within normal limits  OCCULT BLOOD X 1 CARD TO LAB, STOOL - Abnormal; Notable for the following components:   Fecal Occult Bld POSITIVE (*)    All other components within normal limits  CBC  POC OCCULT BLOOD, ED    EKG None  Radiology No results found.  Procedures Procedures   Medications Ordered in ED Medications  pantoprazole (PROTONIX) EC tablet 40 mg (has no administration in time range)  acetaminophen (TYLENOL) tablet 650 mg (650 mg Oral Given 06/28/21 2044)    ED Course  I have reviewed the  triage vital signs and the nursing notes.  Pertinent labs & imaging results that were available during my care of the patient were reviewed by me and considered in my medical decision making (see chart for details).    MDM Rules/Calculators/A&P                         Pt does have melanotic looking stool which is guaiac +.  She is also on her period and was wearing a pad, so it is possible there is some contamination with menstrual blood.  Pt looks well and vitals are nl.  Hgb is nl.  I think pt can go home.  She is not on any blood thinners.  She has good social support and is reasonable.  I am going to put her on protonix and have her follow up with GI.  She has strict instructions to return if worse.   Final Clinical Impression(s) / ED Diagnoses Final diagnoses:  Melanotic stools    Rx / DC Orders ED Discharge Orders          Ordered  pantoprazole (PROTONIX) 40 MG tablet  Daily        06/28/21 2048             Isla Pence, MD 06/28/21 2050

## 2021-06-28 NOTE — ED Notes (Signed)
RN provided AVS using Teachback Method. Patient verbalizes understanding of Discharge Instructions. Opportunity for Questioning and Answers were provided by RN. Patient Discharged from ED ambulatory to Home via Self.  

## 2021-06-28 NOTE — Discharge Instructions (Addendum)
If anything worsens, return.  Avoid any NSAIDs.

## 2021-06-28 NOTE — ED Triage Notes (Signed)
Patient here POV from Home with Melena.  Patient had Gastric Bypass Surgery on Monday which went uncomplicated. Patient noticed Black Tarry Stool today and MD advised ED Evaluation for CBC.  NAD Noted during Triage. No Complaints besides Fatigue since Procedure. A&Ox4. GCS 15. Ambulatory.

## 2021-07-01 ENCOUNTER — Encounter (HOSPITAL_BASED_OUTPATIENT_CLINIC_OR_DEPARTMENT_OTHER): Payer: Self-pay

## 2021-07-01 ENCOUNTER — Other Ambulatory Visit: Payer: Self-pay

## 2021-07-01 ENCOUNTER — Emergency Department (HOSPITAL_BASED_OUTPATIENT_CLINIC_OR_DEPARTMENT_OTHER): Payer: BC Managed Care – PPO

## 2021-07-01 ENCOUNTER — Emergency Department (HOSPITAL_BASED_OUTPATIENT_CLINIC_OR_DEPARTMENT_OTHER)
Admission: EM | Admit: 2021-07-01 | Discharge: 2021-07-01 | Disposition: A | Discharge status: Home / Self Care | Payer: BC Managed Care – PPO | Attending: Emergency Medicine | Admitting: Emergency Medicine

## 2021-07-01 ENCOUNTER — Ambulatory Visit: Payer: Managed Care, Other (non HMO) | Admitting: Adult Health

## 2021-07-01 DIAGNOSIS — Z20822 Contact with and (suspected) exposure to covid-19: Secondary | ICD-10-CM | POA: Insufficient documentation

## 2021-07-01 DIAGNOSIS — Z8616 Personal history of COVID-19: Secondary | ICD-10-CM | POA: Insufficient documentation

## 2021-07-01 DIAGNOSIS — R5383 Other fatigue: Secondary | ICD-10-CM | POA: Diagnosis not present

## 2021-07-01 DIAGNOSIS — J4520 Mild intermittent asthma, uncomplicated: Secondary | ICD-10-CM | POA: Diagnosis not present

## 2021-07-01 DIAGNOSIS — R109 Unspecified abdominal pain: Secondary | ICD-10-CM | POA: Diagnosis not present

## 2021-07-01 DIAGNOSIS — Z7951 Long term (current) use of inhaled steroids: Secondary | ICD-10-CM | POA: Diagnosis not present

## 2021-07-01 DIAGNOSIS — K219 Gastro-esophageal reflux disease without esophagitis: Secondary | ICD-10-CM | POA: Insufficient documentation

## 2021-07-01 DIAGNOSIS — R1012 Left upper quadrant pain: Secondary | ICD-10-CM | POA: Insufficient documentation

## 2021-07-01 DIAGNOSIS — K921 Melena: Secondary | ICD-10-CM | POA: Insufficient documentation

## 2021-07-01 DIAGNOSIS — R140 Abdominal distension (gaseous): Secondary | ICD-10-CM | POA: Insufficient documentation

## 2021-07-01 DIAGNOSIS — E860 Dehydration: Secondary | ICD-10-CM | POA: Insufficient documentation

## 2021-07-01 LAB — CBC
HCT: 37.2 % (ref 36.0–46.0)
Hemoglobin: 12.3 g/dL (ref 12.0–15.0)
MCH: 28.8 pg (ref 26.0–34.0)
MCHC: 33.1 g/dL (ref 30.0–36.0)
MCV: 87.1 fL (ref 80.0–100.0)
Platelets: 427 10*3/uL — ABNORMAL HIGH (ref 150–400)
RBC: 4.27 MIL/uL (ref 3.87–5.11)
RDW: 14.6 % (ref 11.5–15.5)
WBC: 7.4 10*3/uL (ref 4.0–10.5)
nRBC: 0 % (ref 0.0–0.2)

## 2021-07-01 LAB — COMPREHENSIVE METABOLIC PANEL
ALT: 26 U/L (ref 0–44)
AST: 9 U/L — ABNORMAL LOW (ref 15–41)
Albumin: 4.3 g/dL (ref 3.5–5.0)
Alkaline Phosphatase: 53 U/L (ref 38–126)
Anion gap: 10 (ref 5–15)
BUN: 14 mg/dL (ref 6–20)
CO2: 23 mmol/L (ref 22–32)
Calcium: 9.8 mg/dL (ref 8.9–10.3)
Chloride: 105 mmol/L (ref 98–111)
Creatinine, Ser: 0.63 mg/dL (ref 0.44–1.00)
GFR, Estimated: >60 mL/min (ref >60)
Glucose, Bld: 98 mg/dL (ref 70–99)
Potassium: 3.5 mmol/L (ref 3.5–5.1)
Sodium: 138 mmol/L (ref 135–145)
Total Bilirubin: 0.5 mg/dL (ref 0.3–1.2)
Total Protein: 7.5 g/dL (ref 6.5–8.1)

## 2021-07-01 LAB — URINALYSIS, ROUTINE W REFLEX MICROSCOPIC
Bilirubin Urine: NEGATIVE
Glucose, UA: NEGATIVE mg/dL
Hgb urine dipstick: NEGATIVE
Ketones, ur: >80 mg/dL — AB
Leukocytes,Ua: NEGATIVE
Nitrite: NEGATIVE
Specific Gravity, Urine: 1.034 — ABNORMAL HIGH (ref 1.005–1.030)
pH: 6 (ref 5.0–8.0)

## 2021-07-01 LAB — RESP PANEL BY RT-PCR (FLU A&B, COVID) ARPGX2
Influenza A by PCR: NEGATIVE
Influenza B by PCR: NEGATIVE
SARS Coronavirus 2 by RT PCR: NEGATIVE

## 2021-07-01 LAB — PREGNANCY, URINE: Preg Test, Ur: NEGATIVE

## 2021-07-01 LAB — LIPASE, BLOOD: Lipase: 64 U/L — ABNORMAL HIGH (ref 11–51)

## 2021-07-01 MED ORDER — SODIUM CHLORIDE 0.9 % IV BOLUS
1000.0000 mL | Freq: Once | INTRAVENOUS | Status: AC
  Administered 2021-07-01: 16:00:00 1000 mL via INTRAVENOUS

## 2021-07-01 MED ORDER — IOHEXOL 300 MG/ML  SOLN
80.0000 mL | Freq: Once | INTRAMUSCULAR | Status: AC | PRN
  Administered 2021-07-01: 17:00:00 85 mL via INTRAVENOUS

## 2021-07-01 MED ORDER — SODIUM CHLORIDE 0.9 % IV BOLUS
1000.0000 mL | Freq: Once | INTRAVENOUS | Status: AC
  Administered 2021-07-01: 18:00:00 1000 mL via INTRAVENOUS

## 2021-07-01 NOTE — ED Triage Notes (Signed)
Patient here POV from Home with Multiple Complaints.  Patient was seen Friday for Melena and instructed to Follow-Up with GI.   Patient returns for worsened Pain and New Pain to left ABD. Patient also concerned about worsening Fatigue and Continued Melena.    NAD Noted during Triage. A&Ox4. GCS 15. Ambulatory.

## 2021-07-01 NOTE — ED Provider Notes (Signed)
[facility] EMERGENCY DEPT Provider Note   CSN: [id number] Arrival date & time: 07/01/21  1433     History Chief Complaint  Patient presents with   Abdominal Pain    Brenda Williams is a 40 y.o. female.  This is a 40 y.o. female with significant medical history as below, including gastric bypass 1 week ago, PCOS, sleep apnea, anxiety, who presents to the ED with complaint of abdominal pain, melena.  Patient was evaluated this facility approximately 4 days ago with similar complaint.  Patient reports ongoing dark stool following her gastric bypass surgery 1 week ago.  Patient was discharged on antacid.  Symptoms have been somewhat worsening over the past 4 days.  She is now having some pain to her left flank and left upper quadrant during this time.  Pain has been progressive.  Described as a bloating/fullness sensation.  Dull ache.  Still having dark stool.  No straining with bowel movements.  No loose stool.  No bright red blood per rectum.  No pain with urination or change urination.  No nausea or vomiting.  She is tolerating liquids appropriately.  She has been unable to follow-up with her GI surgeon following onset of her symptoms.  Patient reports that she spoke with PA from GI service where she had a procedure done and they are worried about her blood pressure potentially causing the staples to leak in her abdomen.  She was sent to the ER for further evaluation.   Pt reports some ongoing fatigue since prior eval, worsened  Patient also reports that she had endoscopy and colonoscopy in the last 12 months which was within normal limits per the patient.  The history is provided by the patient. No language interpreter was used.  Abdominal Pain Associated symptoms: fatigue   Associated symptoms: no chest pain, no cough, no dysuria, no fever, no nausea, no shortness of breath and no vomiting       Past Medical History:  Diagnosis Date   Abnormal Pap smear 2006   leep    Anxiety    Arthritis    osteoarthritis   Asthma    Complication of anesthesia    [facility]c [facility], HAD ??  SEIZURE/ SHAKING   Cough    Depression    Fatigue    GERD (gastroesophageal reflux disease)    Herpes    never had outbreak. pos per blood, doesn't know which type   Lactose intolerance    Lymphangiomatosis    Polycystic ovarian syndrome    Prediabetes    Sleep apnea    uses CPAP   SVD (spontaneous vaginal delivery)    x 1   Varicose vein of leg    Vitamin D deficiency     Patient Active Problem List   Diagnosis Date Noted   Pulmonary emphysema, unspecified emphysema type ([place]) 12/07/2020   Dyspnea on exertion 08/03/2020   Obstructive sleep apnea 03/08/2020   MDD (major depressive disorder), recurrent episode, severe ([place]) 11/18/2017   Mild intermittent asthma 06/12/2017   Depression 03/17/2017   Vitamin D deficiency 03/17/2017   Class 2 obesity with serious comorbidity and body mass index (BMI) of 35.0 to 35.9 in adult 03/17/2017   Prediabetes 02/23/2017   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   Obesity 01/12/2015   Chronic fatigue 12/01/2014   Pulmonary nodules 06/28/2013   Cervix abnormality 05/12/2011   Polycystic ovaries 05/12/2011   Female infertility of unspecified origin 05/12/2011    Past Surgical History:  Procedure Laterality  Date   DILATION AND EVACUATION N/A 09/14/2012   Procedure: DILATATION AND EVACUATION;  Surgeon: Allena Katz, MD;  Location: [facility];  Service: Gynecology;  Laterality: N/A;   HYSTEROSCOPY  2009   uterine polyp   IVF Retrival  2010, 2011   [facility]   right knee   LEEP  2006   LIPOSUCTION  2021   LUNG BIOPSY Right 12/28/2015   Procedure: RIGHT LUNG BIOPSY;  Surgeon: Ivin Poot, MD;  Location: [facility];  Service: Thoracic;  Laterality: Right;   SPHINCTEROTOMY  2010   tummy tuck  11/12/2019   uterine polyp removal     VIDEO ASSISTED THORACOSCOPY Right 12/28/2015   Procedure: RIGHT VIDEO ASSISTED  THORACOSCOPY;  Surgeon: Ivin Poot, MD;  Location: [facility];  Service: Thoracic;  Laterality: Right;     OB History     Gravida  5   Para  1   Term  1   Preterm  0   AB  2   Living  1      SAB  1   IAB  0   Ectopic  1   Multiple  0   Live Births  1           Family History  Problem Relation Age of Onset   Diabetes Maternal Grandmother    Heart disease Maternal Grandmother    Hypertension Maternal Grandfather    Cancer Maternal Grandfather 35       bone, colon    Diabetes Maternal Grandfather    Colon cancer Maternal Grandfather    Diabetes Mother    Cancer Mother 17       breast cancer   Hyperlipidemia Mother    Depression Mother    Obesity Mother    Hypertension Father    Hyperlipidemia Father    Thyroid disease Father    Alcohol abuse Father    Drug abuse Father    Obesity Father    Anesthesia problems Neg Hx    Colon polyps Neg Hx    Esophageal cancer Neg Hx    Stomach cancer Neg Hx     Social History   Tobacco Use   Smoking status: Never   Smokeless tobacco: Never  Vaping Use   Vaping Use: Never used  Substance Use Topics   Alcohol use: Not Currently    Alcohol/week: 0.0 standard drinks   Drug use: No    Home Medications Prior to Admission medications   Medication Sig Start Date End Date Taking? Authorizing Provider  albuterol (VENTOLIN HFA) 108 (90 Base) MCG/ACT inhaler Inhale 2 puffs into the lungs every 4 (four) hours as needed for wheezing or shortness of breath. 09/30/19   Collene Gobble, MD  azithromycin (ZITHROMAX) 250 MG tablet Take 1 tablet (250 mg total) by mouth daily. Take first 2 tablets together, then 1 every day until finished. 01/25/21   Joy, Shawn C, PA-C  budesonide-formoterol (SYMBICORT) 80-4.5 MCG/ACT inhaler Inhale 2 puffs into the lungs in the morning and at bedtime. 07/11/20   Collene Gobble, MD  Dulaglutide (TRULICITY) 1.5 YB/6.3SL SOPN Inject 1.5 mg into the skin once a week. 01/15/21   Shelda Pal, DO  fluticasone (FLONASE) 50 MCG/ACT nasal spray Place 1-2 sprays into both nostrils daily. 09/25/20   Wieters, Hallie C, PA-C  pantoprazole (PROTONIX) 40 MG tablet Take 1 tablet (40 mg total) by mouth daily. 06/28/21   Isla Pence, MD  rosuvastatin ([place])  20 MG tablet Take 1 tablet (20 mg total) by mouth daily. 12/14/20   Shelda Pal, DO  sirolimus (RAPAMUNE) 1 MG tablet Take 1 tablet (1 mg total) by mouth 2 (two) times daily. 03/24/20   Collene Gobble, MD  phentermine (ADIPEX-P) 37.5 MG tablet Take 1 tablet (37.5 mg total) by mouth daily before breakfast. 01/08/18 06/02/19  Shelda Pal, DO    Allergies    Fentanyl  Review of Systems   Review of Systems  Constitutional:  Positive for fatigue. Negative for activity change and fever.  HENT:  Negative for facial swelling and trouble swallowing.   Eyes:  Negative for discharge and redness.  Respiratory:  Negative for cough and shortness of breath.   Cardiovascular:  Negative for chest pain and palpitations.  Gastrointestinal:  Positive for abdominal distention and abdominal pain. Negative for anal bleeding, blood in stool, nausea, rectal pain and vomiting.       Melena  Genitourinary:  Negative for dysuria and flank pain.  Musculoskeletal:  Negative for back pain and gait problem.  Skin:  Negative for pallor and rash.  Neurological:  Negative for syncope and headaches.   Physical Exam Updated Vital Signs BP 111/82    Pulse 78    Temp (!) 97.5 F (36.4 C)    Resp 18    Ht 5\' 7"  (1.702 m)    Wt 108.9 kg    SpO2 98%    BMI 37.60 kg/m   Physical Exam Vitals and nursing note reviewed.  Constitutional:      General: She is not in acute distress.    Appearance: Normal appearance.  HENT:     Head: Normocephalic and atraumatic.     Right Ear: External ear normal.     Left Ear: External ear normal.     Nose: Nose normal.     Mouth/Throat:     Mouth: Mucous membranes are moist.  Eyes:     General: No  scleral icterus.       Right eye: No discharge.        Left eye: No discharge.     Comments: No conjunctival pallor  Cardiovascular:     Rate and Rhythm: Normal rate and regular rhythm.     Pulses: Normal pulses.     Heart sounds: Normal heart sounds.  Pulmonary:     Effort: Pulmonary effort is normal. No respiratory distress.     Breath sounds: Normal breath sounds.  Abdominal:     General: Abdomen is flat. Bowel sounds are normal. There is distension.     Palpations: Abdomen is soft. There is no mass or pulsatile mass.     Tenderness: There is abdominal tenderness in the left upper quadrant. There is no right CVA tenderness, left CVA tenderness, guarding or rebound. Negative signs include Murphy's sign.     Comments: Multiple surgical scars to abdomen do not appear to be acutely infected, dressings are clean. No cellulitic changes.   Musculoskeletal:        General: Normal range of motion.     Cervical back: Normal range of motion.     Right lower leg: No edema.     Left lower leg: No edema.  Skin:    General: Skin is warm and dry.     Capillary Refill: Capillary refill takes less than 2 seconds.  Neurological:     Mental Status: She is alert.  Psychiatric:        Mood and Affect:  Mood normal.        Behavior: Behavior normal.    ED Results / Procedures / Treatments   Labs (all labs ordered are listed, but only abnormal results are displayed) Labs Reviewed  LIPASE, BLOOD - Abnormal; Notable for the following components:      Result Value   Lipase 64 (*)    All other components within normal limits  COMPREHENSIVE METABOLIC PANEL - Abnormal; Notable for the following components:   AST 9 (*)    All other components within normal limits  CBC - Abnormal; Notable for the following components:   Platelets 427 (*)    All other components within normal limits  URINALYSIS, ROUTINE W REFLEX MICROSCOPIC - Abnormal; Notable for the following components:   Specific Gravity, Urine  1.034 (*)    Ketones, ur >80 (*)    Protein, ur TRACE (*)    All other components within normal limits  RESP PANEL BY RT-PCR (FLU A&B, COVID) ARPGX2  PREGNANCY, URINE    EKG None  Radiology CT ABDOMEN PELVIS W CONTRAST  Result Date: 07/01/2021 CLINICAL DATA:  Acute abdominal pain and left flank pain. Gastric bypass 1 week ago. EXAM: CT ABDOMEN AND PELVIS WITH CONTRAST TECHNIQUE: Multidetector CT imaging of the abdomen and pelvis was performed using the standard protocol following bolus administration of intravenous contrast. CONTRAST:  30mL OMNIPAQUE IOHEXOL 300 MG/ML  SOLN COMPARISON:  CT abdomen and pelvis 08/09/2019. CT chest 01/29/2021. CT chest 05/18/2018. FINDINGS: Lower chest: There are emphysematous changes in the lower lungs bilaterally. 7 mm right middle lobe nodule is unchanged from prior examination in 2019 and likely benign. Hepatobiliary: No focal liver abnormality is seen. No gallstones, gallbladder wall thickening, or biliary dilatation. Pancreas: Unremarkable. No pancreatic ductal dilatation or surrounding inflammatory changes. Spleen: Normal in size without focal abnormality. Adrenals/Urinary Tract: Rounded hypodensity in the liver is too small to characterize, most likely a cyst. Otherwise, kidneys, adrenal glands and bladder are within normal limits. Stomach/Bowel: Patient is status post gastric bypass. No dilated bowel loops are seen. No fluid collections or focal inflammatory stranding. Small bowel, colon and appendix are within normal limits. Vascular/Lymphatic: No significant vascular findings are present. Mildly enlarged right iliac chain lymph node measuring 12 mm is unchanged. No new enlarged lymph nodes are seen. Aorta and IVC are normal in size. Reproductive: Uterus and bilateral adnexa are unremarkable. Other: There is a small fat containing umbilical hernia. There is no ascites. There is left anterior abdominal wall edema with small subcutaneous hematoma measuring 2.2 x  1.5 cm. There is a small amount of air in the left abdominal wall compatible with recent surgery. Musculoskeletal: There are degenerative changes at L5-S1. IMPRESSION: 1. Status post gastric bypass.  No acute process identified. 2. Left abdominal wall edema, small hematoma and air compatible with recent surgery. Electronically Signed   By: Ronney Asters M.D.   On: 07/01/2021 17:21    Procedures .Critical Care Performed by: Jeanell Sparrow, DO Authorized by: Jeanell Sparrow, DO   Critical care provider statement:    Critical care time (minutes):  30   Critical care was necessary to treat or prevent imminent or life-threatening deterioration of the following conditions:  Dehydration   Critical care was time spent personally by me on the following activities:  Development of treatment plan with patient or surrogate, discussions with consultants, evaluation of patient's response to treatment, examination of patient, ordering and review of laboratory studies, ordering and review of radiographic studies, ordering  and performing treatments and interventions, pulse oximetry, re-evaluation of patient's condition and review of old charts   Medications Ordered in ED Medications  sodium chloride 0.9 % bolus 1,000 mL (0 mLs Intravenous Stopped 07/01/21 1737)  iohexol (OMNIPAQUE) 300 MG/ML solution 80 mL (85 mLs Intravenous Contrast Given 07/01/21 1657)  sodium chloride 0.9 % bolus 1,000 mL (0 mLs Intravenous Stopped 07/01/21 1857)    ED Course  I have reviewed the triage vital signs and the nursing notes.  Pertinent labs & imaging results that were available during my care of the patient were reviewed by me and considered in my medical decision making (see chart for details).    MDM Rules/Calculators/A&P                          CC: melena, fatigue, abd pain  This patient complains of above; this involves an extensive number of treatment options and is a complaint that carries with it a high risk of  complications and morbidity. Vital signs were reviewed. Serious etiologies considered.  Given progressive nature of symptoms, worsening abdominal pain, recent surgical procedure: will obtain CT imaging of the abdomen pelvis to further differentiate source for discomfort  Record review:  Previous records obtained and reviewed   Work up as above, notable for:  Labs & imaging results that were available during my care of the patient were reviewed by me and considered in my medical decision making.   Hemoglobin stable, actually improved since prior evaluation.  Lipase is minimally elevated.  I ordered imaging studies which included CT A/P and I independently visualized and interpreted imaging which showed findings consistent with recent abd surgery  Management: Give IVF  Reassessment:  Urinalysis with ketones, patient was recently placed on a full liquid diet following her gastric bypass surgery; she is feeling much better after IV fluids in the ER.  CT imaging reassuring.  CBC is improved from prior.  Advised patient to continue antacid medication, follow-up with GI, follow-up with surgery, follow-up PCP.  Strict return precautions were discussed.   Concern for mild dehydration, give second bolus of IVF.   Pt feeling much better following IVF, d/c with o/p f/u with    The patient improved significantly and was discharged in stable condition. Detailed discussions were had with the patient regarding current findings, and need for close f/u with PCP or on call doctor. The patient has been instructed to return immediately if the symptoms worsen in any way for re-evaluation. Patient verbalized understanding and is in agreement with current care plan. All questions answered prior to discharge.     This chart was dictated using voice recognition software.  Despite best efforts to proofread,  errors can occur which can change the documentation meaning.    Final Clinical Impression(s) /  ED Diagnoses Final diagnoses:  Mild dehydration  Left upper quadrant abdominal pain    Rx / DC Orders ED Discharge Orders     None        Jeanell Sparrow, DO 07/02/21 0023

## 2021-07-02 NOTE — Telephone Encounter (Signed)
Called and spoke to pt. Pt states she is feeling better but had to miss her appt with pulm on 12/19 with TP. Appt has been rescheduled for 12/22 with TP. Pt last seen in 07/2020. Nothing further needed at this time.

## 2021-07-04 ENCOUNTER — Ambulatory Visit: Payer: BC Managed Care – PPO | Admitting: Adult Health

## 2021-07-09 DIAGNOSIS — Z9989 Dependence on other enabling machines and devices: Secondary | ICD-10-CM | POA: Diagnosis not present

## 2021-07-09 DIAGNOSIS — G4733 Obstructive sleep apnea (adult) (pediatric): Secondary | ICD-10-CM | POA: Diagnosis not present

## 2021-07-09 DIAGNOSIS — Z9884 Bariatric surgery status: Secondary | ICD-10-CM | POA: Diagnosis not present

## 2021-08-02 DIAGNOSIS — R109 Unspecified abdominal pain: Secondary | ICD-10-CM | POA: Diagnosis not present

## 2021-08-02 DIAGNOSIS — Z888 Allergy status to other drugs, medicaments and biological substances status: Secondary | ICD-10-CM | POA: Diagnosis not present

## 2021-08-02 DIAGNOSIS — K219 Gastro-esophageal reflux disease without esophagitis: Secondary | ICD-10-CM | POA: Diagnosis not present

## 2021-08-02 DIAGNOSIS — R1012 Left upper quadrant pain: Secondary | ICD-10-CM | POA: Diagnosis not present

## 2021-08-02 DIAGNOSIS — E119 Type 2 diabetes mellitus without complications: Secondary | ICD-10-CM | POA: Diagnosis not present

## 2021-08-02 DIAGNOSIS — Z9884 Bariatric surgery status: Secondary | ICD-10-CM | POA: Diagnosis not present

## 2021-08-02 DIAGNOSIS — E7800 Pure hypercholesterolemia, unspecified: Secondary | ICD-10-CM | POA: Diagnosis not present

## 2021-08-02 DIAGNOSIS — J45909 Unspecified asthma, uncomplicated: Secondary | ICD-10-CM | POA: Diagnosis not present

## 2021-08-02 DIAGNOSIS — Z885 Allergy status to narcotic agent status: Secondary | ICD-10-CM | POA: Diagnosis not present

## 2021-08-07 DIAGNOSIS — K279 Peptic ulcer, site unspecified, unspecified as acute or chronic, without hemorrhage or perforation: Secondary | ICD-10-CM | POA: Insufficient documentation

## 2021-10-01 ENCOUNTER — Ambulatory Visit: Payer: BC Managed Care – PPO | Admitting: Family Medicine

## 2021-10-01 VITALS — BP 124/78 | HR 70 | Temp 97.8°F | Resp 16 | Ht 67.0 in | Wt 200.0 lb

## 2021-10-01 DIAGNOSIS — J069 Acute upper respiratory infection, unspecified: Secondary | ICD-10-CM

## 2021-10-01 DIAGNOSIS — H6982 Other specified disorders of Eustachian tube, left ear: Secondary | ICD-10-CM | POA: Diagnosis not present

## 2021-10-01 MED ORDER — PROMETHAZINE-DM 6.25-15 MG/5ML PO SYRP: 5.0000 mL | ORAL_SOLUTION | Freq: Four times a day (QID) | ORAL | 0 refills | Status: DC | PRN

## 2021-10-01 MED ORDER — PANTOPRAZOLE SODIUM 40 MG PO TBEC: 40.0000 mg | DELAYED_RELEASE_TABLET | Freq: Every day | ORAL | 1 refills | Status: DC

## 2021-10-01 MED ORDER — FLUTICASONE PROPIONATE 50 MCG/ACT NA SUSP: 2.0000 | Freq: Every day | NASAL | 2 refills | Status: DC

## 2021-10-01 NOTE — Patient Instructions (Signed)
Claritin (loratadine), Allegra (fexofenadine), Zyrtec (cetirizine) which is also equivalent to Xyzal (levocetirizine); these are listed in order from weakest to strongest. Generic, and therefore cheaper, options are in the parentheses.  ? ?Flonase (fluticasone); nasal spray that is over the counter. 2 sprays each nostril, once daily. Aim towards the same side eye when you spray. ? ?There are available OTC, and the generic versions, which may be cheaper, are in parentheses. Show this to a pharmacist if you have trouble finding any of these items. ? ?The syrup can make you drowsy. ? ?Let us know if you need anything. ?

## 2021-10-01 NOTE — Progress Notes (Signed)
Chief Complaint  ?Patient presents with  ? Cough  ?  Here for coughing congestion   ? ? ?Brenda Williams here for URI complaints. ? ?Duration: 1 day  ?Associated symptoms: rhinorrhea, ear fullness, sore throat, myalgia, and coughing ?Denies: sinus congestion, sinus pain, itchy watery eyes, ear pain, ear drainage, wheezing, shortness of breath, and fevers ?Treatment to date: none ?Sick contacts: Yes- daughter ? ?Past Medical History:  ?Diagnosis Date  ? Abnormal Pap smear 2006  ? leep  ? Anxiety   ? Arthritis   ? osteoarthritis  ? Asthma   ? Complication of anesthesia   ? [facility], HAD ??  SEIZURE/ SHAKING  ? Cough   ? Depression   ? Fatigue   ? GERD (gastroesophageal reflux disease)   ? Herpes   ? never had outbreak. pos per blood, doesn't know which type  ? Lactose intolerance   ? Lymphangiomatosis   ? Polycystic ovarian syndrome   ? Prediabetes   ? Sleep apnea   ? uses CPAP  ? SVD (spontaneous vaginal delivery)   ? x 1  ? Varicose vein of leg   ? Vitamin D deficiency   ? ? ?Objective ?BP 124/78 (BP Location: Right Arm, Patient Position: Sitting, Cuff Size: Normal)   Pulse 70   Temp 97.8 ?F (36.6 ?C) (Oral)   Resp 16   Ht '5\' 7"'$  (1.702 m)   Wt 200 lb (90.7 kg)   SpO2 97%   BMI 31.32 kg/m?  ?General: Awake, alert, appears stated age ?HEENT: AT, [place], ears patent b/l and TM neg on R neg, on L slightly retracted, nares patent w/o discharge, pharynx pink and without exudates, MMM ?Neck: No masses or asymmetry ?Heart: RRR ?Lungs: CTAB, no accessory muscle use ?Psych: Age appropriate judgment and insight, normal mood and affect ? ?Viral URI with cough - Plan: promethazine-dextromethorphan (PROMETHAZINE-DM) 6.25-15 MG/5ML syrup ? ?Dysfunction of left eustachian tube - Plan: fluticasone (FLONASE) 50 MCG/ACT nasal spray ? ?INCS for ear, syrup for coughing. Warnings about drowsiness verbalized. Continue to push fluids, practice good hand hygiene, cover mouth when coughing. ?F/u prn. If starting to  experience fevers, shaking, or shortness of breath, seek immediate care. ?Pt voiced understanding and agreement to the plan. ? ?Shelda Pal, DO ?10/02/21 ?6:54 AM ? ?

## 2021-10-02 ENCOUNTER — Encounter: Payer: Self-pay | Admitting: Family Medicine

## 2021-10-14 DIAGNOSIS — F4322 Adjustment disorder with anxiety: Secondary | ICD-10-CM | POA: Diagnosis not present

## 2021-12-10 ENCOUNTER — Telehealth: Payer: Self-pay | Admitting: Family Medicine

## 2021-12-10 NOTE — Telephone Encounter (Signed)
Initiate PA for Trulicity KEY:  [id number]

## 2021-12-10 NOTE — Telephone Encounter (Signed)
Initiated second PA for Trulicity KEY: [id number] Received approval. Patient/pharm aware.

## 2022-03-06 ENCOUNTER — Inpatient Hospital Stay (HOSPITAL_BASED_OUTPATIENT_CLINIC_OR_DEPARTMENT_OTHER)
Admission: EM | Admit: 2022-03-06 | Discharge: 2022-03-09 | DRG: 202 | Disposition: A | Discharge status: Home / Self Care | Payer: BC Managed Care – PPO | Attending: Internal Medicine | Admitting: Internal Medicine

## 2022-03-06 ENCOUNTER — Encounter (HOSPITAL_COMMUNITY): Payer: Self-pay

## 2022-03-06 ENCOUNTER — Emergency Department (HOSPITAL_BASED_OUTPATIENT_CLINIC_OR_DEPARTMENT_OTHER): Payer: BC Managed Care – PPO

## 2022-03-06 ENCOUNTER — Other Ambulatory Visit: Payer: Self-pay

## 2022-03-06 ENCOUNTER — Encounter (HOSPITAL_BASED_OUTPATIENT_CLINIC_OR_DEPARTMENT_OTHER): Payer: Self-pay | Admitting: Emergency Medicine

## 2022-03-06 DIAGNOSIS — Z79623 Long term (current) use of mammalian target of rapamycin (mtor) inhibitor: Secondary | ICD-10-CM

## 2022-03-06 DIAGNOSIS — Z91148 Patient's other noncompliance with medication regimen for other reason: Secondary | ICD-10-CM

## 2022-03-06 DIAGNOSIS — E785 Hyperlipidemia, unspecified: Secondary | ICD-10-CM | POA: Diagnosis present

## 2022-03-06 DIAGNOSIS — I248 Other forms of acute ischemic heart disease: Secondary | ICD-10-CM | POA: Diagnosis present

## 2022-03-06 DIAGNOSIS — R519 Headache, unspecified: Secondary | ICD-10-CM | POA: Diagnosis not present

## 2022-03-06 DIAGNOSIS — M1990 Unspecified osteoarthritis, unspecified site: Secondary | ICD-10-CM | POA: Diagnosis present

## 2022-03-06 DIAGNOSIS — E669 Obesity, unspecified: Secondary | ICD-10-CM | POA: Diagnosis present

## 2022-03-06 DIAGNOSIS — Z7951 Long term (current) use of inhaled steroids: Secondary | ICD-10-CM

## 2022-03-06 DIAGNOSIS — I8390 Asymptomatic varicose veins of unspecified lower extremity: Secondary | ICD-10-CM | POA: Diagnosis present

## 2022-03-06 DIAGNOSIS — G4733 Obstructive sleep apnea (adult) (pediatric): Secondary | ICD-10-CM | POA: Diagnosis present

## 2022-03-06 DIAGNOSIS — H6982 Other specified disorders of Eustachian tube, left ear: Secondary | ICD-10-CM

## 2022-03-06 DIAGNOSIS — Z83438 Family history of other disorder of lipoprotein metabolism and other lipidemia: Secondary | ICD-10-CM | POA: Diagnosis not present

## 2022-03-06 DIAGNOSIS — J439 Emphysema, unspecified: Secondary | ICD-10-CM | POA: Diagnosis not present

## 2022-03-06 DIAGNOSIS — Z6831 Body mass index (BMI) 31.0-31.9, adult: Secondary | ICD-10-CM

## 2022-03-06 DIAGNOSIS — Z833 Family history of diabetes mellitus: Secondary | ICD-10-CM | POA: Diagnosis not present

## 2022-03-06 DIAGNOSIS — J4520 Mild intermittent asthma, uncomplicated: Secondary | ICD-10-CM | POA: Diagnosis present

## 2022-03-06 DIAGNOSIS — F32A Depression, unspecified: Secondary | ICD-10-CM | POA: Diagnosis present

## 2022-03-06 DIAGNOSIS — J9601 Acute respiratory failure with hypoxia: Secondary | ICD-10-CM | POA: Diagnosis not present

## 2022-03-06 DIAGNOSIS — E282 Polycystic ovarian syndrome: Secondary | ICD-10-CM | POA: Diagnosis not present

## 2022-03-06 DIAGNOSIS — K219 Gastro-esophageal reflux disease without esophagitis: Secondary | ICD-10-CM | POA: Diagnosis present

## 2022-03-06 DIAGNOSIS — E119 Type 2 diabetes mellitus without complications: Secondary | ICD-10-CM | POA: Diagnosis not present

## 2022-03-06 DIAGNOSIS — Z8249 Family history of ischemic heart disease and other diseases of the circulatory system: Secondary | ICD-10-CM | POA: Diagnosis not present

## 2022-03-06 DIAGNOSIS — J8481 Lymphangioleiomyomatosis: Secondary | ICD-10-CM | POA: Diagnosis present

## 2022-03-06 DIAGNOSIS — Z8616 Personal history of COVID-19: Secondary | ICD-10-CM | POA: Diagnosis not present

## 2022-03-06 DIAGNOSIS — F419 Anxiety disorder, unspecified: Secondary | ICD-10-CM | POA: Diagnosis present

## 2022-03-06 DIAGNOSIS — R778 Other specified abnormalities of plasma proteins: Secondary | ICD-10-CM | POA: Diagnosis not present

## 2022-03-06 DIAGNOSIS — D181 Lymphangioma, any site: Secondary | ICD-10-CM | POA: Diagnosis not present

## 2022-03-06 DIAGNOSIS — R0902 Hypoxemia: Principal | ICD-10-CM

## 2022-03-06 DIAGNOSIS — Z818 Family history of other mental and behavioral disorders: Secondary | ICD-10-CM

## 2022-03-06 DIAGNOSIS — R911 Solitary pulmonary nodule: Secondary | ICD-10-CM | POA: Diagnosis present

## 2022-03-06 DIAGNOSIS — Z7985 Long-term (current) use of injectable non-insulin antidiabetic drugs: Secondary | ICD-10-CM

## 2022-03-06 DIAGNOSIS — R059 Cough, unspecified: Secondary | ICD-10-CM | POA: Diagnosis not present

## 2022-03-06 DIAGNOSIS — D649 Anemia, unspecified: Secondary | ICD-10-CM | POA: Diagnosis present

## 2022-03-06 DIAGNOSIS — I10 Essential (primary) hypertension: Secondary | ICD-10-CM | POA: Diagnosis not present

## 2022-03-06 DIAGNOSIS — H6992 Unspecified Eustachian tube disorder, left ear: Secondary | ICD-10-CM

## 2022-03-06 DIAGNOSIS — Z20822 Contact with and (suspected) exposure to covid-19: Secondary | ICD-10-CM | POA: Diagnosis present

## 2022-03-06 DIAGNOSIS — E739 Lactose intolerance, unspecified: Secondary | ICD-10-CM | POA: Diagnosis present

## 2022-03-06 DIAGNOSIS — Z79899 Other long term (current) drug therapy: Secondary | ICD-10-CM

## 2022-03-06 DIAGNOSIS — Z8619 Personal history of other infectious and parasitic diseases: Secondary | ICD-10-CM

## 2022-03-06 DIAGNOSIS — J45901 Unspecified asthma with (acute) exacerbation: Secondary | ICD-10-CM | POA: Diagnosis not present

## 2022-03-06 DIAGNOSIS — Z885 Allergy status to narcotic agent status: Secondary | ICD-10-CM

## 2022-03-06 DIAGNOSIS — J4521 Mild intermittent asthma with (acute) exacerbation: Secondary | ICD-10-CM | POA: Diagnosis not present

## 2022-03-06 DIAGNOSIS — R0602 Shortness of breath: Secondary | ICD-10-CM | POA: Diagnosis not present

## 2022-03-06 LAB — COMPREHENSIVE METABOLIC PANEL
ALT: 12 U/L (ref 0–44)
AST: 14 U/L — ABNORMAL LOW (ref 15–41)
Albumin: 3.8 g/dL (ref 3.5–5.0)
Alkaline Phosphatase: 57 U/L (ref 38–126)
Anion gap: 5 (ref 5–15)
BUN: 11 mg/dL (ref 6–20)
CO2: 24 mmol/L (ref 22–32)
Calcium: 8.9 mg/dL (ref 8.9–10.3)
Chloride: 111 mmol/L (ref 98–111)
Creatinine, Ser: 0.76 mg/dL (ref 0.44–1.00)
GFR, Estimated: >60 mL/min (ref >60)
Glucose, Bld: 85 mg/dL (ref 70–99)
Potassium: 3.2 mmol/L — ABNORMAL LOW (ref 3.5–5.1)
Sodium: 140 mmol/L (ref 135–145)
Total Bilirubin: 0.6 mg/dL (ref 0.3–1.2)
Total Protein: 7 g/dL (ref 6.5–8.1)

## 2022-03-06 LAB — CBC WITH DIFFERENTIAL/PLATELET
Abs Immature Granulocytes: 0.03 10*3/uL (ref 0.00–0.07)
Basophils Absolute: 0.1 10*3/uL (ref 0.0–0.1)
Basophils Relative: 1 %
Eosinophils Absolute: 0 10*3/uL (ref 0.0–0.5)
Eosinophils Relative: 0 %
HCT: 34.6 % — ABNORMAL LOW (ref 36.0–46.0)
Hemoglobin: 11.4 g/dL — ABNORMAL LOW (ref 12.0–15.0)
Immature Granulocytes: 0 %
Lymphocytes Relative: 18 %
Lymphs Abs: 1.5 10*3/uL (ref 0.7–4.0)
MCH: 28.5 pg (ref 26.0–34.0)
MCHC: 32.9 g/dL (ref 30.0–36.0)
MCV: 86.5 fL (ref 80.0–100.0)
Monocytes Absolute: 0.4 10*3/uL (ref 0.1–1.0)
Monocytes Relative: 4 %
Neutro Abs: 6.6 10*3/uL (ref 1.7–7.7)
Neutrophils Relative %: 77 %
Platelets: 365 10*3/uL (ref 150–400)
RBC: 4 MIL/uL (ref 3.87–5.11)
RDW: 14.4 % (ref 11.5–15.5)
WBC: 8.6 10*3/uL (ref 4.0–10.5)
nRBC: 0 % (ref 0.0–0.2)

## 2022-03-06 LAB — HCG, QUANTITATIVE, PREGNANCY: hCG, Beta Chain, Quant, S: <1 m[IU]/mL (ref <5)

## 2022-03-06 LAB — I-STAT VENOUS BLOOD GAS, ED
Acid-base deficit: 1 mmol/L (ref 0.0–2.0)
Bicarbonate: 23.8 mmol/L (ref 20.0–28.0)
Calcium, Ion: 1.21 mmol/L (ref 1.15–1.40)
HCT: 36 % (ref 36.0–46.0)
Hemoglobin: 12.2 g/dL (ref 12.0–15.0)
O2 Saturation: 66 %
Potassium: 3.5 mmol/L (ref 3.5–5.1)
Sodium: 142 mmol/L (ref 135–145)
TCO2: 25 mmol/L (ref 22–32)
pCO2, Ven: 39 mmHg — ABNORMAL LOW (ref 44–60)
pH, Ven: 7.394 (ref 7.25–7.43)
pO2, Ven: 35 mmHg (ref 32–45)

## 2022-03-06 LAB — RESP PANEL BY RT-PCR (FLU A&B, COVID) ARPGX2
Influenza A by PCR: NEGATIVE
Influenza B by PCR: NEGATIVE
SARS Coronavirus 2 by RT PCR: NEGATIVE

## 2022-03-06 LAB — TROPONIN I (HIGH SENSITIVITY)
Troponin I (High Sensitivity): 43 ng/L — ABNORMAL HIGH (ref <18)
Troponin I (High Sensitivity): 61 ng/L — ABNORMAL HIGH (ref <18)

## 2022-03-06 MED ORDER — METHYLPREDNISOLONE SODIUM SUCC 125 MG IJ SOLR
125.0000 mg | Freq: Once | INTRAMUSCULAR | Status: AC
  Administered 2022-03-06: 125 mg via INTRAVENOUS
  Filled 2022-03-06: qty 2

## 2022-03-06 MED ORDER — IOHEXOL 350 MG/ML SOLN
75.0000 mL | Freq: Once | INTRAVENOUS | Status: AC | PRN
  Administered 2022-03-06: 75 mL via INTRAVENOUS

## 2022-03-06 MED ORDER — IPRATROPIUM-ALBUTEROL 0.5-2.5 (3) MG/3ML IN SOLN
6.0000 mL | Freq: Once | RESPIRATORY_TRACT | Status: AC
  Administered 2022-03-06: 6 mL via RESPIRATORY_TRACT
  Filled 2022-03-06: qty 6

## 2022-03-06 NOTE — ED Notes (Signed)
Pt ambulated to BR upon returning sats 80 % on room air. Increase SOB. 02 at 2 L/min via [place] initiated. EDP notified

## 2022-03-06 NOTE — ED Notes (Addendum)
Late entry -- Pt awake and alert - GCS 15.  RR even and unlabored at rest on 2L O2 via [place] however with activity/exertion pt becomes sob and hypoxic on 2L -- O2 desat to 86% on 2L via  when moving in bed; at rest pt O2 sat improvement to 94%.  Continuous cardiac and pulse ox monitoring maintained.  Pt reports chest tightness however states it is not painful - attributes chest tightness to sob.   Abdomen soft, nontender.  Skin warm dry and intact.  Will monitor for acute changes and maintain plan of care as pt awaits bed assignment; pt agreeable with plan for admission.

## 2022-03-06 NOTE — ED Triage Notes (Signed)
SOB with productive cough worse with any exertion. PT had COVID a few weeks ago and feels like breathing has been getting worse since then.

## 2022-03-06 NOTE — ED Notes (Signed)
Pt now sitting up in bed eating tv dinner - tolerating PO intake without issue.  No acute changes/distress.  Visitors at bedside at this time

## 2022-03-06 NOTE — ED Provider Notes (Signed)
[place] EMERGENCY DEPARTMENT Provider Note   CSN: [id number] Arrival date & time: 03/06/22  1342     History  Chief Complaint  Patient presents with   Shortness of Breath    Brenda Williams is a 41 y.o. female.  41 year old female with past medical history that is significant for lymphangiomatosis presents today for evaluation of shortness of breath.  Patient states she was diagnosed with COVID about 1 month ago.  Her symptoms were fairly mild and she did well recovering.  However she states since she has progressively become more short of breath, significantly worsening today.  She states after completing some chores earlier today she felt significantly short of breath and she checked her O2 using her home pulse oximeter which showed initial O2 sats of 79%.  She took 2 puffs of her albuterol inhaler which increased her O2 sats to 88%.  She states her baseline is mid 90s.  Given her O2 sats did not significantly improve she presented to the emergency room for evaluation.  She denies fever, chills.  Reports after using the albuterol inhaler she has noticed increase in her cough.  This is a dry cough.  She endorses chest tightness but denies chest pain, lightheadedness, or palpitations.  He is without peripheral edema, recent long travel by flight, or car, recent surgery.  Denies history of DVT, PE.  The history is provided by the patient. No language interpreter was used.       Home Medications Prior to Admission medications   Medication Sig Start Date End Date Taking? Authorizing Provider  albuterol (VENTOLIN HFA) 108 (90 Base) MCG/ACT inhaler Inhale 2 puffs into the lungs every 4 (four) hours as needed for wheezing or shortness of breath. 09/30/19   Collene Gobble, MD  budesonide-formoterol (SYMBICORT) 80-4.5 MCG/ACT inhaler Inhale 2 puffs into the lungs in the morning and at bedtime. 07/11/20   Collene Gobble, MD  Dulaglutide (TRULICITY) 1.5 DQ/2.2WL SOPN Inject 1.5 mg  into the skin once a week. 01/15/21   Shelda Pal, DO  fluticasone (FLONASE) 50 MCG/ACT nasal spray Place 2 sprays into both nostrils daily. 10/01/21   Shelda Pal, DO  pantoprazole (PROTONIX) 40 MG tablet Take 1 tablet (40 mg total) by mouth daily. 10/01/21   Shelda Pal, DO  promethazine-dextromethorphan (PROMETHAZINE-DM) 6.25-15 MG/5ML syrup Take 5 mLs by mouth 4 (four) times daily as needed for cough. 10/01/21   Shelda Pal, DO  rosuvastatin (CRESTOR) 20 MG tablet Take 1 tablet (20 mg total) by mouth daily. Patient not taking: Reported on 10/01/2021 12/14/20   Shelda Pal, DO  sirolimus (RAPAMUNE) 1 MG tablet Take 1 tablet (1 mg total) by mouth 2 (two) times daily. 03/24/20   Collene Gobble, MD  phentermine (ADIPEX-P) 37.5 MG tablet Take 1 tablet (37.5 mg total) by mouth daily before breakfast. 01/08/18 06/02/19  Shelda Pal, DO      Allergies    Fentanyl    Review of Systems   Review of Systems  Constitutional:  Negative for chills and fever.  Respiratory:  Positive for shortness of breath.   Cardiovascular:  Positive for chest pain. Negative for palpitations and leg swelling.  Gastrointestinal:  Negative for abdominal pain and nausea.  Neurological:  Negative for weakness and light-headedness.  All other systems reviewed and are negative.   Physical Exam Updated Vital Signs BP (!) 137/92   Pulse 86   Temp 98.9 F (37.2 C) (Oral)  Resp 14   Ht '5\' 7"'$  (1.702 m)   Wt 90.7 kg   LMP 02/06/2022 (Approximate)   SpO2 91%   BMI 31.32 kg/m  Physical Exam Vitals and nursing note reviewed.  Constitutional:      General: She is not in acute distress.    Appearance: Normal appearance. She is not ill-appearing.  HENT:     Head: Normocephalic and atraumatic.     Nose: Nose normal.  Eyes:     General: No scleral icterus.    Extraocular Movements: Extraocular movements intact.     Conjunctiva/sclera: Conjunctivae  normal.  Cardiovascular:     Rate and Rhythm: Normal rate and regular rhythm.     Pulses: Normal pulses.  Pulmonary:     Effort: Pulmonary effort is normal. No respiratory distress.     Breath sounds: Normal breath sounds. No wheezing or rales.  Abdominal:     General: There is no distension.     Tenderness: There is no abdominal tenderness.  Musculoskeletal:        General: Normal range of motion.     Cervical back: Normal range of motion.     Right lower leg: No edema.     Left lower leg: No edema.  Skin:    General: Skin is warm and dry.  Neurological:     General: No focal deficit present.     Mental Status: She is alert. Mental status is at baseline.     ED Results / Procedures / Treatments   Labs (all labs ordered are listed, but only abnormal results are displayed) Labs Reviewed  CBC WITH DIFFERENTIAL/PLATELET - Abnormal; Notable for the following components:      Result Value   Hemoglobin 11.4 (*)    HCT 34.6 (*)    All other components within normal limits  I-STAT VENOUS BLOOD GAS, ED - Abnormal; Notable for the following components:   pCO2, Ven 39.0 (*)    All other components within normal limits  RESP PANEL BY RT-PCR (FLU A&B, COVID) ARPGX2  BLOOD GAS, VENOUS  COMPREHENSIVE METABOLIC PANEL  HCG, QUANTITATIVE, PREGNANCY  TROPONIN I (HIGH SENSITIVITY)    EKG None  Radiology DG Chest 2 View  Result Date: 03/06/2022 CLINICAL DATA:  Shortness of breath with productive cough. Patient reports COVID infection a few weeks ago. EXAM: CHEST - 2 VIEW COMPARISON:  Radiographs 01/25/2021 and 10/31/2020.  CT 01/29/2021. FINDINGS: The heart size and mediastinal contours are stable. There is stable chronic lung disease with diffuse reticulonodular interstitial opacities which have been previously attributed to the patient's lymphangiomyomatosis. No superimposed airspace disease, definite edema, pleural effusion or pneumothorax. The bones appear unchanged. IMPRESSION: Stable  chronic lung disease with diffuse interstitial prominence attributed to lymphangiomyomatosis. No evidence of acute superimposed process. Electronically Signed   By: Richardean Sale M.D.   On: 03/06/2022 14:29    Procedures Procedures    Medications Ordered in ED Medications  ipratropium-albuterol (DUONEB) 0.5-2.5 (3) MG/3ML nebulizer solution 6 mL (6 mLs Nebulization Given 03/06/22 1450)    ED Course/ Medical Decision Making/ A&P                           Medical Decision Making Amount and/or Complexity of Data Reviewed Radiology: ordered.  Risk Prescription drug management. Decision regarding hospitalization.   Medical Decision Making / ED Course   This patient presents to the ED for concern of shortness of breath, this involves an extensive number  of treatment options, and is a complaint that carries with it a high risk of complications and morbidity.  The differential diagnosis includes ACS, PE, pneumonia, viral URI  MDM: 41 year old female with past medical history that is significant for lymphangiomatosis presents today for evaluation of shortness of breath.  Hypoxic earlier at home at 79% on room air.  Improved to 88% after albuterol inhaler.  Patient during my evaluation dropped to about 85% after about 3 minutes of ambulation within the room.  Had dyspnea on exertion.  She denies chest pain but endorses chest tightness.  Will evaluate with blood work including troponin, CT angio chest to rule out PE. Patient after my evaluation was ambulated to the bathroom.  Upon returning to the room patient was hooked up to SPO2 monitor and was noted to have O2 sats on room air of 80%.  Patient has since been placed on nasal cannula supplemental O2. CBC without leukocytosis.  Mild anemia compared to baseline which is around 12.4.  CMP shows potassium of 3.2, otherwise unremarkable.  COVID and flu negative.  VBG without acute changes.  Initial troponin of 43, repeat of 61.  Likely secondary to  demand due to hypoxia.  EKG without acute ischemic changes.  Chest x-ray without acute cardiopulmonary process.  Will discuss with hospitalist for admission.  For hypoxia.  Will give first dose of Solu-Medrol of 125 mg.  Likely exacerbation of her underlying lymphangiomatosis.  She has been noncompliant with her sirolimus over the past month.  Patient discussed with hospitalist will accept patient for admission.   Lab Tests: -I ordered, reviewed, and interpreted labs.   The pertinent results include:   Labs Reviewed  COMPREHENSIVE METABOLIC PANEL - Abnormal; Notable for the following components:      Result Value   Potassium 3.2 (*)    AST 14 (*)    All other components within normal limits  CBC WITH DIFFERENTIAL/PLATELET - Abnormal; Notable for the following components:   Hemoglobin 11.4 (*)    HCT 34.6 (*)    All other components within normal limits  I-STAT VENOUS BLOOD GAS, ED - Abnormal; Notable for the following components:   pCO2, Ven 39.0 (*)    All other components within normal limits  TROPONIN I (HIGH SENSITIVITY) - Abnormal; Notable for the following components:   Troponin I (High Sensitivity) 43 (*)    All other components within normal limits  RESP PANEL BY RT-PCR (FLU A&B, COVID) ARPGX2  HCG, QUANTITATIVE, PREGNANCY  BLOOD GAS, VENOUS  TROPONIN I (HIGH SENSITIVITY)      EKG  EKG Interpretation  Date/Time:    Ventricular Rate:    PR Interval:    QRS Duration:   QT Interval:    QTC Calculation:   R Axis:     Text Interpretation:           Imaging Studies ordered: I ordered imaging studies including chest x-ray, CTA chest I independently visualized and interpreted imaging. I agree with the radiologist interpretation   Medicines ordered and prescription drug management: Meds ordered this encounter  Medications   ipratropium-albuterol (DUONEB) 0.5-2.5 (3) MG/3ML nebulizer solution 6 mL   iohexol (OMNIPAQUE) 350 MG/ML injection 75 mL    -I have  reviewed the patients home medicines and have made adjustments as needed  Reevaluation: After the interventions noted above, I reevaluated the patient and found that they have :stayed the same  Co morbidities that complicate the patient evaluation  Past Medical History:  Diagnosis Date  Abnormal Pap smear 2006   leep   Anxiety    Arthritis    osteoarthritis   Asthma    Complication of anesthesia    2014 d+c [facility], HAD ??  SEIZURE/ SHAKING   Cough    Depression    Fatigue    GERD (gastroesophageal reflux disease)    Herpes    never had outbreak. pos per blood, doesn't know which type   Lactose intolerance    Lymphangiomatosis    Polycystic ovarian syndrome    Prediabetes    Sleep apnea    uses CPAP   SVD (spontaneous vaginal delivery)    x 1   Varicose vein of leg    Vitamin D deficiency       Dispostion: Patient discussed with hospitalist who will evaluate patient for admission.    Final Clinical Impression(s) / ED Diagnoses Final diagnoses:  Hypoxia  Lymphangiomatosis    Rx / DC Orders ED Discharge Orders     None         Evlyn Courier, PA-C 03/06/22 2200    Drenda Freeze, MD 03/13/22 [phone]

## 2022-03-06 NOTE — ED Provider Notes (Incomplete)
[place] EMERGENCY DEPARTMENT Provider Note   CSN: [id number] Arrival date & time: 03/06/22  1342     History  Chief Complaint  Patient presents with  . Shortness of Breath    AARA JACQUOT is a 41 y.o. female.   Shortness of Breath      Home Medications Prior to Admission medications   Medication Sig Start Date End Date Taking? Authorizing Provider  albuterol (VENTOLIN HFA) 108 (90 Base) MCG/ACT inhaler Inhale 2 puffs into the lungs every 4 (four) hours as needed for wheezing or shortness of breath. 09/30/19   Collene Gobble, MD  budesonide-formoterol (SYMBICORT) 80-4.5 MCG/ACT inhaler Inhale 2 puffs into the lungs in the morning and at bedtime. 07/11/20   Collene Gobble, MD  Dulaglutide (TRULICITY) 1.5 GY/1.7CB SOPN Inject 1.5 mg into the skin once a week. 01/15/21   Shelda Pal, DO  fluticasone (FLONASE) 50 MCG/ACT nasal spray Place 2 sprays into both nostrils daily. 10/01/21   Shelda Pal, DO  pantoprazole (PROTONIX) 40 MG tablet Take 1 tablet (40 mg total) by mouth daily. 10/01/21   Shelda Pal, DO  promethazine-dextromethorphan (PROMETHAZINE-DM) 6.25-15 MG/5ML syrup Take 5 mLs by mouth 4 (four) times daily as needed for cough. 10/01/21   Shelda Pal, DO  rosuvastatin (CRESTOR) 20 MG tablet Take 1 tablet (20 mg total) by mouth daily. Patient not taking: Reported on 10/01/2021 12/14/20   Shelda Pal, DO  sirolimus (RAPAMUNE) 1 MG tablet Take 1 tablet (1 mg total) by mouth 2 (two) times daily. 03/24/20   Collene Gobble, MD  phentermine (ADIPEX-P) 37.5 MG tablet Take 1 tablet (37.5 mg total) by mouth daily before breakfast. 01/08/18 06/02/19  Shelda Pal, DO      Allergies    Fentanyl    Review of Systems   Review of Systems  Respiratory:  Positive for shortness of breath.     Physical Exam Updated Vital Signs Ht '5\' 7"'$  (1.702 m)   Wt 90.7 kg   LMP 02/06/2022 (Approximate)   BMI 31.32  kg/m  Physical Exam  ED Results / Procedures / Treatments   Labs (all labs ordered are listed, but only abnormal results are displayed) Labs Reviewed - No data to display  EKG None  Radiology DG Chest 2 View  Result Date: 03/06/2022 CLINICAL DATA:  Shortness of breath with productive cough. Patient reports COVID infection a few weeks ago. EXAM: CHEST - 2 VIEW COMPARISON:  Radiographs 01/25/2021 and 10/31/2020.  CT 01/29/2021. FINDINGS: The heart size and mediastinal contours are stable. There is stable chronic lung disease with diffuse reticulonodular interstitial opacities which have been previously attributed to the patient's lymphangiomyomatosis. No superimposed airspace disease, definite edema, pleural effusion or pneumothorax. The bones appear unchanged. IMPRESSION: Stable chronic lung disease with diffuse interstitial prominence attributed to lymphangiomyomatosis. No evidence of acute superimposed process. Electronically Signed   By: Richardean Sale M.D.   On: 03/06/2022 14:29    Procedures Procedures  {Document cardiac monitor, telemetry assessment procedure when appropriate:1}  Medications Ordered in ED Medications - No data to display  ED Course/ Medical Decision Making/ A&P                           Medical Decision Making Amount and/or Complexity of Data Reviewed Radiology: ordered.   ***  {Document critical care time when appropriate:1} {Document review of labs and clinical decision tools ie heart score, Chads2Vasc2  etc:1}  {Document your independent review of radiology images, and any outside records:1} {Document your discussion with family members, caretakers, and with consultants:1} {Document social determinants of health affecting pt's care:1} {Document your decision making why or why not admission, treatments were needed:1} Final Clinical Impression(s) / ED Diagnoses Final diagnoses:  None    Rx / DC Orders ED Discharge Orders     None

## 2022-03-07 ENCOUNTER — Encounter (HOSPITAL_COMMUNITY): Payer: Self-pay | Admitting: Internal Medicine

## 2022-03-07 DIAGNOSIS — R0602 Shortness of breath: Secondary | ICD-10-CM | POA: Diagnosis present

## 2022-03-07 DIAGNOSIS — Z818 Family history of other mental and behavioral disorders: Secondary | ICD-10-CM | POA: Diagnosis not present

## 2022-03-07 DIAGNOSIS — J9601 Acute respiratory failure with hypoxia: Secondary | ICD-10-CM | POA: Diagnosis not present

## 2022-03-07 DIAGNOSIS — E785 Hyperlipidemia, unspecified: Secondary | ICD-10-CM | POA: Diagnosis not present

## 2022-03-07 DIAGNOSIS — Z6831 Body mass index (BMI) 31.0-31.9, adult: Secondary | ICD-10-CM | POA: Diagnosis not present

## 2022-03-07 DIAGNOSIS — Z20822 Contact with and (suspected) exposure to covid-19: Secondary | ICD-10-CM | POA: Diagnosis not present

## 2022-03-07 DIAGNOSIS — K219 Gastro-esophageal reflux disease without esophagitis: Secondary | ICD-10-CM | POA: Diagnosis not present

## 2022-03-07 DIAGNOSIS — I10 Essential (primary) hypertension: Secondary | ICD-10-CM | POA: Diagnosis not present

## 2022-03-07 DIAGNOSIS — G4733 Obstructive sleep apnea (adult) (pediatric): Secondary | ICD-10-CM | POA: Diagnosis not present

## 2022-03-07 DIAGNOSIS — I248 Other forms of acute ischemic heart disease: Secondary | ICD-10-CM | POA: Diagnosis not present

## 2022-03-07 DIAGNOSIS — J439 Emphysema, unspecified: Secondary | ICD-10-CM | POA: Diagnosis not present

## 2022-03-07 DIAGNOSIS — E119 Type 2 diabetes mellitus without complications: Secondary | ICD-10-CM | POA: Diagnosis not present

## 2022-03-07 DIAGNOSIS — Z83438 Family history of other disorder of lipoprotein metabolism and other lipidemia: Secondary | ICD-10-CM | POA: Diagnosis not present

## 2022-03-07 DIAGNOSIS — Z833 Family history of diabetes mellitus: Secondary | ICD-10-CM | POA: Diagnosis not present

## 2022-03-07 DIAGNOSIS — F32A Depression, unspecified: Secondary | ICD-10-CM | POA: Diagnosis not present

## 2022-03-07 DIAGNOSIS — D649 Anemia, unspecified: Secondary | ICD-10-CM | POA: Diagnosis not present

## 2022-03-07 DIAGNOSIS — Z91148 Patient's other noncompliance with medication regimen for other reason: Secondary | ICD-10-CM | POA: Diagnosis not present

## 2022-03-07 DIAGNOSIS — Z8249 Family history of ischemic heart disease and other diseases of the circulatory system: Secondary | ICD-10-CM | POA: Diagnosis not present

## 2022-03-07 DIAGNOSIS — J4521 Mild intermittent asthma with (acute) exacerbation: Secondary | ICD-10-CM | POA: Diagnosis not present

## 2022-03-07 DIAGNOSIS — E282 Polycystic ovarian syndrome: Secondary | ICD-10-CM | POA: Diagnosis not present

## 2022-03-07 DIAGNOSIS — Z8616 Personal history of COVID-19: Secondary | ICD-10-CM | POA: Diagnosis not present

## 2022-03-07 DIAGNOSIS — R911 Solitary pulmonary nodule: Secondary | ICD-10-CM | POA: Diagnosis not present

## 2022-03-07 DIAGNOSIS — F419 Anxiety disorder, unspecified: Secondary | ICD-10-CM | POA: Diagnosis not present

## 2022-03-07 DIAGNOSIS — E669 Obesity, unspecified: Secondary | ICD-10-CM | POA: Diagnosis not present

## 2022-03-07 DIAGNOSIS — J8481 Lymphangioleiomyomatosis: Secondary | ICD-10-CM | POA: Diagnosis not present

## 2022-03-07 LAB — CBC
HCT: 36.9 % (ref 36.0–46.0)
Hemoglobin: 11.8 g/dL — ABNORMAL LOW (ref 12.0–15.0)
MCH: 28.2 pg (ref 26.0–34.0)
MCHC: 32 g/dL (ref 30.0–36.0)
MCV: 88.3 fL (ref 80.0–100.0)
Platelets: 386 10*3/uL (ref 150–400)
RBC: 4.18 MIL/uL (ref 3.87–5.11)
RDW: 14.3 % (ref 11.5–15.5)
WBC: 6.3 10*3/uL (ref 4.0–10.5)
nRBC: 0 % (ref 0.0–0.2)

## 2022-03-07 LAB — BASIC METABOLIC PANEL
Anion gap: 8 (ref 5–15)
BUN: 9 mg/dL (ref 6–20)
CO2: 23 mmol/L (ref 22–32)
Calcium: 9.3 mg/dL (ref 8.9–10.3)
Chloride: 112 mmol/L — ABNORMAL HIGH (ref 98–111)
Creatinine, Ser: 0.52 mg/dL (ref 0.44–1.00)
GFR, Estimated: >60 mL/min (ref >60)
Glucose, Bld: 87 mg/dL (ref 70–99)
Potassium: 3.7 mmol/L (ref 3.5–5.1)
Sodium: 143 mmol/L (ref 135–145)

## 2022-03-07 LAB — MAGNESIUM: Magnesium: 2.4 mg/dL (ref 1.7–2.4)

## 2022-03-07 LAB — GLUCOSE, CAPILLARY
Glucose-Capillary: 117 mg/dL — ABNORMAL HIGH (ref 70–99)
Glucose-Capillary: 280 mg/dL — ABNORMAL HIGH (ref 70–99)

## 2022-03-07 LAB — PHOSPHORUS: Phosphorus: 4 mg/dL (ref 2.5–4.6)

## 2022-03-07 LAB — HEMOGLOBIN A1C
Hgb A1c MFr Bld: 5.3 % (ref 4.8–5.6)
Mean Plasma Glucose: 105.41 mg/dL

## 2022-03-07 MED ORDER — PROMETHAZINE-DM 6.25-15 MG/5ML PO SYRP
5.0000 mL | ORAL_SOLUTION | Freq: Four times a day (QID) | ORAL | Status: DC | PRN
Start: 2022-03-07 — End: 2022-03-07

## 2022-03-07 MED ORDER — ACETAMINOPHEN 325 MG PO TABS
650.0000 mg | ORAL_TABLET | Freq: Four times a day (QID) | ORAL | Status: DC | PRN
  Administered 2022-03-07 – 2022-03-08 (×3): 650 mg via ORAL
  Filled 2022-03-07 (×3): qty 2

## 2022-03-07 MED ORDER — PROMETHAZINE HCL 6.25 MG/5ML PO SYRP: 6.2500 mg | ORAL_SOLUTION | Freq: Four times a day (QID) | ORAL | Status: DC | PRN

## 2022-03-07 MED ORDER — PREDNISONE 20 MG PO TABS
40.0000 mg | ORAL_TABLET | Freq: Once | ORAL | Status: AC
  Administered 2022-03-07: 40 mg via ORAL
  Filled 2022-03-07: qty 2

## 2022-03-07 MED ORDER — IPRATROPIUM-ALBUTEROL 0.5-2.5 (3) MG/3ML IN SOLN
3.0000 mL | Freq: Four times a day (QID) | RESPIRATORY_TRACT | Status: DC
  Administered 2022-03-07 – 2022-03-08 (×3): 3 mL via RESPIRATORY_TRACT
  Filled 2022-03-07 (×3): qty 3

## 2022-03-07 MED ORDER — ALBUTEROL SULFATE (2.5 MG/3ML) 0.083% IN NEBU
2.5000 mg | INHALATION_SOLUTION | RESPIRATORY_TRACT | Status: DC | PRN
Start: 2022-03-07 — End: 2022-03-09

## 2022-03-07 MED ORDER — FLUTICASONE PROPIONATE 50 MCG/ACT NA SUSP: 2.0000 | Freq: Every day | NASAL | Status: DC

## 2022-03-07 MED ORDER — INSULIN ASPART 100 UNIT/ML IJ SOLN: 0.0000 [IU] | Freq: Three times a day (TID) | INTRAMUSCULAR | Status: DC

## 2022-03-07 MED ORDER — POTASSIUM CHLORIDE CRYS ER 20 MEQ PO TBCR
40.0000 meq | EXTENDED_RELEASE_TABLET | Freq: Once | ORAL | Status: AC
  Administered 2022-03-07: 40 meq via ORAL
  Filled 2022-03-07: qty 2

## 2022-03-07 MED ORDER — PROMETHAZINE-DM 6.25-15 MG/5ML PO SYRP: 5.0000 mL | ORAL_SOLUTION | Freq: Four times a day (QID) | ORAL | Status: DC | PRN

## 2022-03-07 MED ORDER — PANTOPRAZOLE SODIUM 40 MG PO TBEC
40.0000 mg | DELAYED_RELEASE_TABLET | Freq: Every day | ORAL | Status: DC
  Administered 2022-03-08 – 2022-03-09 (×2): 40 mg via ORAL
  Filled 2022-03-07 (×2): qty 1

## 2022-03-07 MED ORDER — DEXTROMETHORPHAN POLISTIREX ER 30 MG/5ML PO SUER
15.0000 mg | Freq: Four times a day (QID) | ORAL | Status: DC | PRN
Start: 2022-03-07 — End: 2022-03-09

## 2022-03-07 MED ORDER — SIROLIMUS 1 MG PO TABS
1.0000 mg | ORAL_TABLET | Freq: Two times a day (BID) | ORAL | Status: DC
  Administered 2022-03-07 – 2022-03-09 (×4): 1 mg via ORAL
  Filled 2022-03-07 (×4): qty 1

## 2022-03-07 MED ORDER — ONDANSETRON HCL 4 MG PO TABS: 4.0000 mg | ORAL_TABLET | Freq: Four times a day (QID) | ORAL | Status: DC | PRN

## 2022-03-07 MED ORDER — AMLODIPINE BESYLATE 5 MG PO TABS
5.0000 mg | ORAL_TABLET | Freq: Every day | ORAL | Status: DC
  Administered 2022-03-07 – 2022-03-09 (×3): 5 mg via ORAL
  Filled 2022-03-07 (×3): qty 1

## 2022-03-07 MED ORDER — ENOXAPARIN SODIUM 40 MG/0.4ML IJ SOSY
40.0000 mg | PREFILLED_SYRINGE | INTRAMUSCULAR | Status: DC
  Administered 2022-03-07 – 2022-03-08 (×2): 40 mg via SUBCUTANEOUS
  Filled 2022-03-07 (×2): qty 0.4

## 2022-03-07 MED ORDER — MOMETASONE FURO-FORMOTEROL FUM 100-5 MCG/ACT IN AERO
2.0000 | INHALATION_SPRAY | Freq: Two times a day (BID) | RESPIRATORY_TRACT | Status: DC
  Filled 2022-03-07: qty 8.8

## 2022-03-07 MED ORDER — PREDNISONE 20 MG PO TABS
40.0000 mg | ORAL_TABLET | Freq: Every day | ORAL | Status: DC
  Administered 2022-03-08 – 2022-03-09 (×2): 40 mg via ORAL
  Filled 2022-03-07 (×2): qty 2

## 2022-03-07 MED ORDER — ALBUTEROL SULFATE HFA 108 (90 BASE) MCG/ACT IN AERS: 2.0000 | INHALATION_SPRAY | RESPIRATORY_TRACT | Status: DC | PRN

## 2022-03-07 MED ORDER — ONDANSETRON HCL 4 MG/2ML IJ SOLN: 4.0000 mg | Freq: Four times a day (QID) | INTRAMUSCULAR | Status: DC | PRN

## 2022-03-07 NOTE — ED Notes (Signed)
Assisted pt to bedside commode.

## 2022-03-07 NOTE — Plan of Care (Signed)
  Problem: Education: Goal: Knowledge of General Education information will improve Description: Including pain rating scale, medication(s)/side effects and non-pharmacologic comfort measures Outcome: Progressing   Problem: Health Behavior/Discharge Planning: Goal: Ability to manage health-related needs will improve Outcome: Progressing   Problem: Clinical Measurements: Goal: Ability to maintain clinical measurements within normal limits will improve Outcome: Progressing Goal: Will remain free from infection Outcome: Progressing Goal: Diagnostic test results will improve Outcome: Progressing Goal: Respiratory complications will improve Outcome: Progressing Goal: Cardiovascular complication will be avoided Outcome: Progressing   Problem: Activity: Goal: Risk for activity intolerance will decrease Outcome: Progressing   Problem: Nutrition: Goal: Adequate nutrition will be maintained Outcome: Progressing   Problem: Coping: Goal: Level of anxiety will decrease Outcome: Progressing   Problem: Elimination: Goal: Will not experience complications related to bowel motility Outcome: Progressing Goal: Will not experience complications related to urinary retention Outcome: Progressing   Problem: Pain Managment: Goal: General experience of comfort will improve Outcome: Progressing   Problem: Safety: Goal: Ability to remain free from injury will improve Outcome: Progressing   Problem: Skin Integrity: Goal: Risk for impaired skin integrity will decrease Outcome: Progressing   Problem: Education: Goal: Knowledge of disease or condition will improve Outcome: Progressing Goal: Knowledge of the prescribed therapeutic regimen will improve Outcome: Progressing Goal: Individualized Educational Video(s) Outcome: Progressing   Problem: Activity: Goal: Ability to tolerate increased activity will improve Outcome: Progressing Goal: Will verbalize the importance of balancing  activity with adequate rest periods Outcome: Progressing   Problem: Respiratory: Goal: Ability to maintain a clear airway will improve Outcome: Progressing Goal: Levels of oxygenation will improve Outcome: Progressing Goal: Ability to maintain adequate ventilation will improve Outcome: Progressing   Problem: Education: Goal: Ability to describe self-care measures that may prevent or decrease complications (Diabetes Survival Skills Education) will improve Outcome: Progressing Goal: Individualized Educational Video(s) Outcome: Progressing   Problem: Coping: Goal: Ability to adjust to condition or change in health will improve Outcome: Progressing   Problem: Fluid Volume: Goal: Ability to maintain a balanced intake and output will improve Outcome: Progressing   Problem: Health Behavior/Discharge Planning: Goal: Ability to identify and utilize available resources and services will improve Outcome: Progressing Goal: Ability to manage health-related needs will improve Outcome: Progressing   Problem: Metabolic: Goal: Ability to maintain appropriate glucose levels will improve Outcome: Progressing   Problem: Nutritional: Goal: Maintenance of adequate nutrition will improve Outcome: Progressing Goal: Progress toward achieving an optimal weight will improve Outcome: Progressing   Problem: Skin Integrity: Goal: Risk for impaired skin integrity will decrease Outcome: Progressing   Problem: Tissue Perfusion: Goal: Adequacy of tissue perfusion will improve Outcome: Progressing

## 2022-03-07 NOTE — H&P (Signed)
History and Physical    Patient: Brenda Williams EVO:[id number] DOB: [birth year] DOA: 03/06/2022 DOS: the patient was seen and examined on 03/07/2022 PCP: Shelda Pal, DO  Patient coming from: Home  Chief Complaint:  Chief Complaint  Patient presents with   Shortness of Breath   HPI: Brenda Williams is a 41 y.o. female with medical history significant of abnormal Pap smear, anxiety, depression, osteoarthritis, mild intermittent asthma, lymphangiomatosis, fatigue, GERD, positive herpes simplex blood test with history of outbreaks, lactose intolerance, polycystic ovarian syndrome, type II DM, sleep apnea on CPAP lower extremity varicosity, vitamin D deficiency, class I obesity, history of COVID-19 a few weeks ago who is coming to the emergency department with complaints of progressively worse dyspnea associated with productive cough that worsens with exertion, wheezing and fatigue.  No fever, chills or night sweats.  Mild sore throat from coughing, but no rhinorrhea or hemoptysis.  No chest pain, palpitations, diaphoresis, PND, orthopnea or pitting edema of the lower extremities.  No appetite changes, abdominal pain, diarrhea, constipation, melena or hematochezia.  No flank pain, dysuria, frequency or hematuria.  No polyuria, polydipsia, polyphagia or blurred vision.  ED course: Initial vital signs were temperature 98.9 F, pulse 88, respirations 18, BP 137/92 mmHg O2 sat 89% on room air.  The patient received supplemental oxygen, DuoNeb and methylprednisolone 125 mg IVP.  Lab work: CBC is her white count 8.6, hemoglobin 11.4 g/dL platelets 365.  Venous blood gas was normal set for PCO2 of 39.0 mmHg.  CMP remarkable for potassium of 3.2 mmol/L.  The rest of the CMP measurements are unremarkable.  Troponin was 43 and then 61 ng/L.  Coronavirus influenza PCR negative.  Imaging: Two-view chest radiograph shows stable chronic lung disease with diffuse interstitial prominence contributor to  lymphangiomatosis.  CTA chest with no evidence of PE, lymph adenomatosis and unchanged solid pulmonary nodule.  Review of Systems: As mentioned in the history of present illness. All other systems reviewed and are negative. Past Medical History:  Diagnosis Date   Abnormal Pap smear 2006   leep   Anxiety    Arthritis    osteoarthritis   Asthma    Complication of anesthesia    2014 d+c [facility], HAD ??  SEIZURE/ SHAKING   Cough    Depression    Fatigue    GERD (gastroesophageal reflux disease)    Herpes    never had outbreak. pos per blood, doesn't know which type   Lactose intolerance    Lymphangiomatosis    Polycystic ovarian syndrome    Prediabetes    Sleep apnea    uses CPAP   SVD (spontaneous vaginal delivery)    x 1   Varicose vein of leg    Vitamin D deficiency    Past Surgical History:  Procedure Laterality Date   DILATION AND EVACUATION N/A 09/14/2012   Procedure: DILATATION AND EVACUATION;  Surgeon: Allena Katz, MD;  Location: [facility];  Service: Gynecology;  Laterality: N/A;   HYSTEROSCOPY  2009   uterine polyp   IVF Retrival  2010, 2011   [place]   right knee   LEEP  2006   LIPOSUCTION  2021   LUNG BIOPSY Right 12/28/2015   Procedure: RIGHT LUNG BIOPSY;  Surgeon: Ivin Poot, MD;  Location: [facility];  Service: Thoracic;  Laterality: Right;   SPHINCTEROTOMY  2010   tummy tuck  11/12/2019   uterine polyp removal     VIDEO ASSISTED  THORACOSCOPY Right 12/28/2015   Procedure: RIGHT VIDEO ASSISTED THORACOSCOPY;  Surgeon: Ivin Poot, MD;  Location: [facility];  Service: Thoracic;  Laterality: Right;   Social History:  reports that she has never smoked. She has never used smokeless tobacco. She reports that she does not currently use alcohol. She reports that she does not use drugs.  Allergies  Allergen Reactions   Fentanyl Itching    Benadryl effective for itching.    Family History  Problem Relation Age of Onset   Diabetes Maternal  Grandmother    Heart disease Maternal Grandmother    Hypertension Maternal Grandfather    Cancer Maternal Grandfather 21       bone, colon    Diabetes Maternal Grandfather    Colon cancer Maternal Grandfather    Diabetes Mother    Cancer Mother 23       breast cancer   Hyperlipidemia Mother    Depression Mother    Obesity Mother    Hypertension Father    Hyperlipidemia Father    Thyroid disease Father    Alcohol abuse Father    Drug abuse Father    Obesity Father    Anesthesia problems Neg Hx    Colon polyps Neg Hx    Esophageal cancer Neg Hx    Stomach cancer Neg Hx     Prior to Admission medications   Medication Sig Start Date End Date Taking? Authorizing Provider  albuterol (VENTOLIN HFA) 108 (90 Base) MCG/ACT inhaler Inhale 2 puffs into the lungs every 4 (four) hours as needed for wheezing or shortness of breath. 09/30/19   Collene Gobble, MD  budesonide-formoterol (SYMBICORT) 80-4.5 MCG/ACT inhaler Inhale 2 puffs into the lungs in the morning and at bedtime. 07/11/20   Collene Gobble, MD  Dulaglutide (TRULICITY) 1.5 FT/7.3UK SOPN Inject 1.5 mg into the skin once a week. 01/15/21   Shelda Pal, DO  fluticasone (FLONASE) 50 MCG/ACT nasal spray Place 2 sprays into both nostrils daily. 10/01/21   Shelda Pal, DO  pantoprazole (PROTONIX) 40 MG tablet Take 1 tablet (40 mg total) by mouth daily. 10/01/21   Shelda Pal, DO  promethazine-dextromethorphan (PROMETHAZINE-DM) 6.25-15 MG/5ML syrup Take 5 mLs by mouth 4 (four) times daily as needed for cough. 10/01/21   Shelda Pal, DO  rosuvastatin (CRESTOR) 20 MG tablet Take 1 tablet (20 mg total) by mouth daily. Patient not taking: Reported on 10/01/2021 12/14/20   Shelda Pal, DO  sirolimus (RAPAMUNE) 1 MG tablet Take 1 tablet (1 mg total) by mouth 2 (two) times daily. 03/24/20   Collene Gobble, MD  phentermine (ADIPEX-P) 37.5 MG tablet Take 1 tablet (37.5 mg total) by mouth daily  before breakfast. 01/08/18 06/02/19  Shelda Pal, DO    Physical Exam: Vitals:   03/07/22 0900 03/07/22 1000 03/07/22 1116 03/07/22 1212  BP: (!) 139/93 (!) 154/103 (!) 136/97 (!) 162/103  Pulse: 75 67 77 65  Resp: 17 15 (!) 24 16  Temp:    97.7 F (36.5 C)  TempSrc:    Oral  SpO2: 95% 96% 98% 99%  Weight:      Height:       Physical Exam Vitals and nursing note reviewed.  Constitutional:      General: She is awake. She is not in acute distress.    Appearance: She is well-developed. She is obese.  HENT:     Head: Normocephalic.     Mouth/Throat:  Mouth: Mucous membranes are moist.  Eyes:     General: No scleral icterus.    Pupils: Pupils are equal, round, and reactive to light.  Neck:     Vascular: No JVD.  Cardiovascular:     Rate and Rhythm: Normal rate and regular rhythm.     Heart sounds: S1 normal and S2 normal.  Pulmonary:     Effort: Pulmonary effort is normal.     Breath sounds: Decreased breath sounds and wheezing present. No rhonchi or rales.  Abdominal:     General: Bowel sounds are normal. There is no distension.     Palpations: Abdomen is soft.     Tenderness: There is no abdominal tenderness. There is no guarding.  Musculoskeletal:     Cervical back: Neck supple.     Right lower leg: No edema.     Left lower leg: No edema.  Skin:    General: Skin is warm and dry.  Neurological:     General: No focal deficit present.     Mental Status: She is alert and oriented to person, place, and time.  Psychiatric:        Mood and Affect: Mood normal.        Behavior: Behavior normal. Behavior is cooperative.   Data Reviewed:  There are no new results to review at this time.  Assessment and Plan: Principal Problem:   Acute respiratory failure with hypoxia (HCC) In the setting of exacerbation of:   Pulmonary emphysema, unspecified emphysema type (HCC)   Mild intermittent asthma Observation/telemetry Continue supplemental  oxygen. Methylprednisolone 125 mg IVP x1 given in ED. Followed by prednisone 40 mg p.o. daily in a.m. Scheduled and as needed bronchodilators. Follow-up CBC and chemistry in the morning.   Active Problems:   Lymphangioleiomyomatosis (HCC) Resume Rapamune 1 mg p.o. twice daily. Follow-up with pulm neurology as an outpatient.    Obstructive sleep apnea Continue CPAP at bedtime.    GERD (gastroesophageal reflux disease) Continue pantoprazole 40 mg p.o. daily.    Type 2 diabetes mellitus, without long-term current use of insulin (HCC) Much better after weight loss. CBG monitoring with [facility] while on glucocorticoids. Check hemoglobin A1c.    Advance Care Planning:   Code Status: Full Code   Consults:   Family Communication:   Severity of Illness: The appropriate patient status for this patient is OBSERVATION. Observation status is judged to be reasonable and necessary in order to provide the required intensity of service to ensure the patient's safety. The patient's presenting symptoms, physical exam findings, and initial radiographic and laboratory data in the context of their medical condition is felt to place them at decreased risk for further clinical deterioration. Furthermore, it is anticipated that the patient will be medically stable for discharge from the hospital within 2 midnights of admission.   Author: Reubin Milan, MD 03/07/2022 12:19 PM  For on call review [email or web address].   This document was prepared using Dragon voice recognition software and may contain some unintended transcription errors.

## 2022-03-07 NOTE — Progress Notes (Signed)
Pt arrives via [facility] from [facility].  Pt AxO x4, walked to the bathroom independently with no issues.  Admission completed.

## 2022-03-07 NOTE — ED Notes (Signed)
[facility] at bedside to transport pt to [facility], Report given to [facility] on [place].  Pt stable for transport

## 2022-03-08 ENCOUNTER — Observation Stay (HOSPITAL_COMMUNITY): Payer: BC Managed Care – PPO

## 2022-03-08 DIAGNOSIS — J8481 Lymphangioleiomyomatosis: Secondary | ICD-10-CM

## 2022-03-08 DIAGNOSIS — Z833 Family history of diabetes mellitus: Secondary | ICD-10-CM | POA: Diagnosis not present

## 2022-03-08 DIAGNOSIS — E119 Type 2 diabetes mellitus without complications: Secondary | ICD-10-CM | POA: Diagnosis present

## 2022-03-08 DIAGNOSIS — Z91148 Patient's other noncompliance with medication regimen for other reason: Secondary | ICD-10-CM | POA: Diagnosis not present

## 2022-03-08 DIAGNOSIS — F419 Anxiety disorder, unspecified: Secondary | ICD-10-CM | POA: Diagnosis present

## 2022-03-08 DIAGNOSIS — F32A Depression, unspecified: Secondary | ICD-10-CM | POA: Diagnosis present

## 2022-03-08 DIAGNOSIS — Z83438 Family history of other disorder of lipoprotein metabolism and other lipidemia: Secondary | ICD-10-CM | POA: Diagnosis not present

## 2022-03-08 DIAGNOSIS — I10 Essential (primary) hypertension: Secondary | ICD-10-CM | POA: Diagnosis present

## 2022-03-08 DIAGNOSIS — R778 Other specified abnormalities of plasma proteins: Secondary | ICD-10-CM | POA: Diagnosis not present

## 2022-03-08 DIAGNOSIS — I248 Other forms of acute ischemic heart disease: Secondary | ICD-10-CM | POA: Diagnosis present

## 2022-03-08 DIAGNOSIS — D649 Anemia, unspecified: Secondary | ICD-10-CM | POA: Diagnosis present

## 2022-03-08 DIAGNOSIS — Z6831 Body mass index (BMI) 31.0-31.9, adult: Secondary | ICD-10-CM | POA: Diagnosis not present

## 2022-03-08 DIAGNOSIS — J4521 Mild intermittent asthma with (acute) exacerbation: Secondary | ICD-10-CM | POA: Diagnosis present

## 2022-03-08 DIAGNOSIS — G4733 Obstructive sleep apnea (adult) (pediatric): Secondary | ICD-10-CM | POA: Diagnosis present

## 2022-03-08 DIAGNOSIS — R911 Solitary pulmonary nodule: Secondary | ICD-10-CM | POA: Diagnosis present

## 2022-03-08 DIAGNOSIS — R0602 Shortness of breath: Secondary | ICD-10-CM

## 2022-03-08 DIAGNOSIS — Z20822 Contact with and (suspected) exposure to covid-19: Secondary | ICD-10-CM | POA: Diagnosis present

## 2022-03-08 DIAGNOSIS — E669 Obesity, unspecified: Secondary | ICD-10-CM | POA: Diagnosis present

## 2022-03-08 DIAGNOSIS — K219 Gastro-esophageal reflux disease without esophagitis: Secondary | ICD-10-CM | POA: Diagnosis present

## 2022-03-08 DIAGNOSIS — E785 Hyperlipidemia, unspecified: Secondary | ICD-10-CM | POA: Diagnosis present

## 2022-03-08 DIAGNOSIS — Z8249 Family history of ischemic heart disease and other diseases of the circulatory system: Secondary | ICD-10-CM | POA: Diagnosis not present

## 2022-03-08 DIAGNOSIS — J45901 Unspecified asthma with (acute) exacerbation: Secondary | ICD-10-CM

## 2022-03-08 DIAGNOSIS — Z818 Family history of other mental and behavioral disorders: Secondary | ICD-10-CM | POA: Diagnosis not present

## 2022-03-08 DIAGNOSIS — J439 Emphysema, unspecified: Secondary | ICD-10-CM | POA: Diagnosis present

## 2022-03-08 DIAGNOSIS — J9601 Acute respiratory failure with hypoxia: Secondary | ICD-10-CM | POA: Diagnosis present

## 2022-03-08 DIAGNOSIS — Z8616 Personal history of COVID-19: Secondary | ICD-10-CM | POA: Diagnosis not present

## 2022-03-08 DIAGNOSIS — E282 Polycystic ovarian syndrome: Secondary | ICD-10-CM | POA: Diagnosis present

## 2022-03-08 LAB — ECHOCARDIOGRAM COMPLETE
Area-P 1/2: 3.08 cm2
Calc EF: 52.5 %
Height: 67 in
S' Lateral: 3.8 cm
Single Plane A2C EF: 54.6 %
Single Plane A4C EF: 47.9 %
Weight: 3199.32 oz

## 2022-03-08 LAB — HIV ANTIBODY (ROUTINE TESTING W REFLEX): HIV Screen 4th Generation wRfx: NONREACTIVE

## 2022-03-08 LAB — COMPREHENSIVE METABOLIC PANEL
ALT: 11 U/L (ref 0–44)
AST: 9 U/L — ABNORMAL LOW (ref 15–41)
Albumin: 3.5 g/dL (ref 3.5–5.0)
Alkaline Phosphatase: 54 U/L (ref 38–126)
Anion gap: 6 (ref 5–15)
BUN: 13 mg/dL (ref 6–20)
CO2: 25 mmol/L (ref 22–32)
Calcium: 9.1 mg/dL (ref 8.9–10.3)
Chloride: 111 mmol/L (ref 98–111)
Creatinine, Ser: 0.61 mg/dL (ref 0.44–1.00)
GFR, Estimated: >60 mL/min (ref >60)
Glucose, Bld: 104 mg/dL — ABNORMAL HIGH (ref 70–99)
Potassium: 3.6 mmol/L (ref 3.5–5.1)
Sodium: 142 mmol/L (ref 135–145)
Total Bilirubin: 0.8 mg/dL (ref 0.3–1.2)
Total Protein: 6.8 g/dL (ref 6.5–8.1)

## 2022-03-08 LAB — GLUCOSE, CAPILLARY
Glucose-Capillary: 107 mg/dL — ABNORMAL HIGH (ref 70–99)
Glucose-Capillary: 96 mg/dL (ref 70–99)
Glucose-Capillary: 148 mg/dL — ABNORMAL HIGH (ref 70–99)
Glucose-Capillary: 127 mg/dL — ABNORMAL HIGH (ref 70–99)

## 2022-03-08 MED ORDER — IPRATROPIUM-ALBUTEROL 0.5-2.5 (3) MG/3ML IN SOLN
3.0000 mL | Freq: Three times a day (TID) | RESPIRATORY_TRACT | Status: DC
Start: 2022-03-08 — End: 2022-03-09
  Administered 2022-03-08 – 2022-03-09 (×3): 3 mL via RESPIRATORY_TRACT
  Filled 2022-03-08 (×3): qty 3

## 2022-03-08 NOTE — Progress Notes (Addendum)
PROGRESS NOTE   Brenda Williams  ELF:[id number]    DOB: 05-29-1981    DOA: 03/06/2022  PCP: Shelda Pal, DO   I have briefly reviewed patients previous medical records in [facility].  Chief Complaint  Patient presents with   Shortness of Breath    Brief Narrative:  41 year old female, PMH of mild intermittent asthma, anxiety and depression, GERD, lymphangioleiomyomatosis, PCOS, type II DM, OSA not yet on CPAP, history of COVID-19 infection a few weeks prior, presented to the ED on 03/06/2022 with complaints of progressive dyspnea both at rest and at exertion, productive cough, wheezing, fatigue that started same day.  Home oxygen saturations 79%, after albuterol inhaler, went up to 88%.  Per ED EP note, had persistent hypoxia with oxygen saturations of 85% with symptoms, on exertion and subsequently 80%.  She received supplemental oxygen, DuoNeb and Solu-Medrol 125 mg IV x1.  CTA chest without central PE but showed known lymphangiomyomatosis.  Admitted for acute respiratory failure with hypoxia due to asthma exacerbation and lymphangiomatosis.  Noncompliant with sirolimus over the past month. Improving.   Assessment & Plan:  Principal Problem:   Acute respiratory failure with hypoxia (HCC) Active Problems:   Lymphangioleiomyomatosis (HCC)   Mild intermittent asthma   Obstructive sleep apnea   Pulmonary emphysema, unspecified emphysema type (HCC)   GERD (gastroesophageal reflux disease)   Type 2 diabetes mellitus, without long-term current use of insulin (HCC)   Acute respiratory failure with hypoxia due to asthma exacerbation: Complicating history of mild intermittent asthma, lymphangioleiomyomatosis and OSA, with history of noncompliance with most therapies as per pulmonology office notes from January 2022.  Currently reports of noncompliance with sirolimus over the past month.  Appears to have chronic DOE.  She feels that current episode may have been precipitated by  pressure washing in her apartment complex the day before and she was able to smell the chemicals.  She also worked with some epoxy project at home.  CTA chest: No central PE, diffuse cystic lung disease compatible with history of lymphangioleiomyomatosis, no CO2 retention by VBG, SARS coronavirus 19 and flu panel PCR negative.  S/p Solu-Medrol 125 Mg IVP x1 in ED, continue scheduled Dulera instead of Symbicort, DuoNeb, as needed albuterol nebs, prednisone 40 mg daily and sirolimus.  Slowly improving but still with significant DOE.  Monitor closely.  Outpatient follow-up with Dr. Lamonte Sakai.  Mild intermittent asthma: Management as above  Lymphangioleiomyomatosis: Continue sirolimus  Elevated troponin secondary to demand ischemia: HS Troponin 43 > 61.  No anginal symptoms.  TTE: LVEF 50-55%  OSA Has not yet received CPAP at home and this seems to be an ongoing issue since at least January 2022.  Tolerated CPAP in the hospital last night, continue.  GERD: Continue pantoprazole.  Type II DM, controlled: Appears to be diet controlled.  Not on meds PTA.  A1c 5.3 on 03/07/2022 indicating good control. CBG and SSi while on steroids.  Hyperlipidemia No longer taking rosuvastatin.  Hypertension Continue amlodipine.  Anxiety and depression Not on meds at home  RUL solid pulmonary nodule 9 mm: Seen on CTA chest 03/06/2022, per radiology, unchanged compared to prior exams dating back to 2018 and considered benign.  Pulmonology follow-up outpatient.  Normocytic anemia: Stable.  Body mass index is 31.32 kg/m./Obesity    DVT prophylaxis: enoxaparin (LOVENOX) injection 40 mg Start: 03/07/22 1400     Code Status: Full Code:  Family Communication: None at bedside Disposition:  Status is: Observation The patient will require  care spanning > 2 midnights and should be moved to inpatient because: Despite care having crossed 2 midnights, although better, still with significant DOE on ambulating to the  bathroom, thereby needs continued in-house treatment and close monitoring and his risk for decline.   Consultants:     Procedures:     Antimicrobials:      Subjective:  Overall feels better.  No longer having dyspnea at rest but has dyspnea even on ambulation to the bathroom and back.  Had headache this morning, relieved with Tylenol.  Precipitating events as noted above.  No cough or chest pain.  Objective:   Vitals:   03/08/22 0417 03/08/22 0813 03/08/22 1401 03/08/22 1415  BP: 126/88   113/76  Pulse: 64   86  Resp: 14   16  Temp: 98.1 F (36.7 C)   97.6 F (36.4 C)  TempSrc: Oral   Oral  SpO2: 97% 96% 95% 96%  Weight:      Height:        General exam: Young female, moderately built and obese sitting up comfortably in bed without distress. Respiratory system: Clear to auscultation. Respiratory effort normal. Cardiovascular system: S1 & S2 heard, RRR. No JVD, murmurs, rubs, gallops or clicks. No pedal edema.  Telemetry personally reviewed: Sinus rhythm. Gastrointestinal system: Abdomen is nondistended, soft and nontender. No organomegaly or masses felt. Normal bowel sounds heard. Central nervous system: Alert and oriented. No focal neurological deficits. Extremities: Symmetric 5 x 5 power. Skin: No rashes, lesions or ulcers Psychiatry: Judgement and insight appear normal. Mood & affect appropriate.     Data Reviewed:   I have personally reviewed following labs and imaging studies   CBC: Recent Labs  Lab 03/06/22 1458 03/06/22 1503 03/07/22 1301  WBC 8.6  --  6.3  NEUTROABS 6.6  --   --   HGB 11.4* 12.2 11.8*  HCT 34.6* 36.0 36.9  MCV 86.5  --  88.3  PLT 365  --  633    Basic Metabolic Panel: Recent Labs  Lab 03/06/22 1458 03/06/22 1503 03/07/22 1301 03/08/22 0720  NA 140 142 143 142  K 3.2* 3.5 3.7 3.6  CL 111  --  112* 111  CO2 24  --  23 25  GLUCOSE 85  --  87 104*  BUN 11  --  9 13  CREATININE 0.76  --  0.52 0.61  CALCIUM 8.9  --  9.3 9.1   MG  --   --  2.4  --   PHOS  --   --  4.0  --     Liver Function Tests: Recent Labs  Lab 03/06/22 1458 03/08/22 0720  AST 14* 9*  ALT 12 11  ALKPHOS 57 54  BILITOT 0.6 0.8  PROT 7.0 6.8  ALBUMIN 3.8 3.5    CBG: Recent Labs  Lab 03/07/22 2106 03/08/22 0739 03/08/22 1117  GLUCAP 280* 107* 96    Microbiology Studies:   Recent Results (from the past 240 hour(s))  Resp Panel by RT-PCR (Flu A&B, Covid) Anterior Nasal Swab     Status: None   Collection Time: 03/06/22  3:20 PM   Specimen: Anterior Nasal Swab  Result Value Ref Range Status   SARS Coronavirus 2 by RT PCR NEGATIVE NEGATIVE Final    Comment: (NOTE) SARS-CoV-2 target nucleic acids are NOT DETECTED.  The SARS-CoV-2 RNA is generally detectable in upper respiratory specimens during the acute phase of infection. The lowest concentration of SARS-CoV-2 viral copies this  assay can detect is 138 copies/mL. A negative result does not preclude SARS-Cov-2 infection and should not be used as the sole basis for treatment or other patient management decisions. A negative result may occur with  improper specimen collection/handling, submission of specimen other than nasopharyngeal swab, presence of viral mutation(s) within the areas targeted by this assay, and inadequate number of viral copies(<138 copies/mL). A negative result must be combined with clinical observations, patient history, and epidemiological information. The expected result is Negative.  Fact Sheet for Patients:  EntrepreneurPulse.com.au  Fact Sheet for Healthcare Providers:  IncredibleEmployment.be  This test is no t yet approved or cleared by the [place] FDA and  has been authorized for detection and/or diagnosis of SARS-CoV-2 by FDA under an Emergency Use Authorization (EUA). This EUA will remain  in effect (meaning this test can be used) for the duration of the COVID-19 declaration under Section 564(b)(1)  of the Act, 21 U.S.C.section 360bbb-3(b)(1), unless the authorization is terminated  or revoked sooner.       Influenza A by PCR NEGATIVE NEGATIVE Final   Influenza B by PCR NEGATIVE NEGATIVE Final    Comment: (NOTE) The Xpert Xpress SARS-CoV-2/FLU/RSV plus assay is intended as an aid in the diagnosis of influenza from Nasopharyngeal swab specimens and should not be used as a sole basis for treatment. Nasal washings and aspirates are unacceptable for Xpert Xpress SARS-CoV-2/FLU/RSV testing.  Fact Sheet for Patients: EntrepreneurPulse.com.au  Fact Sheet for Healthcare Providers: IncredibleEmployment.be  This test is not yet approved or cleared by the [place] FDA and has been authorized for detection and/or diagnosis of SARS-CoV-2 by FDA under an Emergency Use Authorization (EUA). This EUA will remain in effect (meaning this test can be used) for the duration of the COVID-19 declaration under Section 564(b)(1) of the Act, 21 U.S.C. section 360bbb-3(b)(1), unless the authorization is terminated or revoked.  Performed at [facility], [address] [place], [place] [address]     Radiology Studies:  ECHOCARDIOGRAM COMPLETE  Result Date: 03/08/2022    ECHOCARDIOGRAM REPORT   Patient Name:   PENNEY DOMANSKI Date of Exam: 03/08/2022 Medical Rec #:  [id number]        Height:       67.0 in Accession #:    [id number]       Weight:       200.0 lb Date of Birth:  10-14-1980       BSA:          2.022 m Patient Age:    84 years         BP:           126/88 mmHg Patient Gender: F                HR:           59 bpm. Exam Location:  Inpatient Procedure: 2D Echo, Cardiac Doppler and Color Doppler Indications:    Elevated Troponin  History:        Patient has prior history of Echocardiogram examinations, most                 recent 05/28/2021. Risk Factors:Sleep Apnea. GERD.  Sonographer:    Darlina Sicilian RDCS Referring Phys: [id number] DAVID  [place]  Sonographer Comments: Unable to obtain SSN. IMPRESSIONS  1. Left ventricular ejection fraction, by estimation, is 50 to 55%. The left ventricle has low normal function. The left ventricle has no regional wall motion abnormalities.  Left ventricular diastolic parameters were normal.  2. Right ventricular systolic function is normal. The right ventricular size is normal. Tricuspid regurgitation signal is inadequate for assessing PA pressure.  3. The mitral valve is grossly normal. No evidence of mitral valve regurgitation. No evidence of mitral stenosis.  4. The aortic valve is grossly normal. Aortic valve regurgitation is not visualized. No aortic stenosis is present.  5. The inferior vena cava is normal in size with <50% respiratory variability, suggesting right atrial pressure of 8 mmHg. Comparison(s): Prior images unable to be directly viewed, comparison made by report only. Conclusion(s)/Recommendation(s): Otherwise normal echocardiogram, with minor abnormalities described in the report. FINDINGS  Left Ventricle: Left ventricular ejection fraction, by estimation, is 50 to 55%. The left ventricle has low normal function. The left ventricle has no regional wall motion abnormalities. The left ventricular internal cavity size was normal in size. There is no left ventricular hypertrophy. Left ventricular diastolic parameters were normal. Right Ventricle: The right ventricular size is normal. No increase in right ventricular wall thickness. Right ventricular systolic function is normal. Tricuspid regurgitation signal is inadequate for assessing PA pressure. Left Atrium: Left atrial size was normal in size. Right Atrium: Right atrial size was normal in size. Pericardium: There is no evidence of pericardial effusion. Mitral Valve: The mitral valve is grossly normal. No evidence of mitral valve regurgitation. No evidence of mitral valve stenosis. Tricuspid Valve: The tricuspid valve is grossly normal.  Tricuspid valve regurgitation is not demonstrated. No evidence of tricuspid stenosis. Aortic Valve: The aortic valve is grossly normal. Aortic valve regurgitation is not visualized. No aortic stenosis is present. Pulmonic Valve: The pulmonic valve was not well visualized. Pulmonic valve regurgitation is not visualized. Aorta: The aortic root and ascending aorta are structurally normal, with no evidence of dilitation and the ascending aorta was not well visualized. Venous: The inferior vena cava is normal in size with less than 50% respiratory variability, suggesting right atrial pressure of 8 mmHg. IAS/Shunts: The atrial septum is grossly normal.  LEFT VENTRICLE PLAX 2D LVIDd:         5.00 cm      Diastology LVIDs:         3.80 cm      LV e' medial:    5.51 cm/s LV PW:         1.10 cm      LV E/e' medial:  9.7 LV IVS:        0.90 cm      LV e' lateral:   8.51 cm/s LVOT diam:     1.90 cm      LV E/e' lateral: 6.3 LV SV:         55 LV SV Index:   27 LVOT Area:     2.84 cm  LV Volumes (MOD) LV vol d, MOD A2C: 143.0 ml LV vol d, MOD A4C: 117.0 ml LV vol s, MOD A2C: 64.9 ml LV vol s, MOD A4C: 60.9 ml LV SV MOD A2C:     78.1 ml LV SV MOD A4C:     117.0 ml LV SV MOD BP:      69.8 ml RIGHT VENTRICLE RV S prime:     8.27 cm/s TAPSE (M-mode): 1.6 cm LEFT ATRIUM             Index        RIGHT ATRIUM           Index LA diam:  2.80 cm 1.38 cm/m   RA Area:     11.70 cm LA Vol (A2C):   49.4 ml 24.43 ml/m  RA Volume:   24.00 ml  11.87 ml/m LA Vol (A4C):   36.4 ml 18.00 ml/m LA Biplane Vol: 43.8 ml 21.66 ml/m  AORTIC VALVE LVOT Vmax:   96.60 cm/s LVOT Vmean:  64.500 cm/s LVOT VTI:    0.195 m  AORTA Ao Root diam: 3.20 cm MITRAL VALVE MV Area (PHT): 3.08 cm    SHUNTS MV Decel Time: 246 msec    Systemic VTI:  0.20 m MV E velocity: 53.60 cm/s  Systemic Diam: 1.90 cm MV A velocity: 29.60 cm/s MV E/A ratio:  1.81 Buford Dresser MD Electronically signed by Buford Dresser MD Signature Date/Time:  03/08/2022/11:57:45 AM    Final    CT Angio Chest PE W and/or Wo Contrast  Result Date: 03/06/2022 CLINICAL DATA:  Shortness of breath EXAM: CT ANGIOGRAPHY CHEST WITH CONTRAST TECHNIQUE: Multidetector CT imaging of the chest was performed using the standard protocol during bolus administration of intravenous contrast. Multiplanar CT image reconstructions and MIPs were obtained to evaluate the vascular anatomy. RADIATION DOSE REDUCTION: This exam was performed according to the departmental dose-optimization program which includes automated exposure control, adjustment of the mA and/or kV according to patient size and/or use of iterative reconstruction technique. CONTRAST:  41m OMNIPAQUE IOHEXOL 350 MG/ML SOLN COMPARISON:  Chest CT dated January 29, 2021 FINDINGS: Cardiovascular: No evidence of central pulmonary embolus, evaluation of the segmental and subsegmental pulmonary arteries is limited due to bolus timing and respiratory motion artifact. Normal heart size. No pericardial effusion. Normal caliber thoracic aorta with no atherosclerotic disease. Mediastinum/Nodes: Esophagus and thyroid are unremarkable. No pathologically enlarged lymph nodes seen in the chest. Lungs/Pleura: Central airways are patent. Diffuse thin-walled cysts are seen throughout the lungs. Solid pulmonary nodule of the right upper lobe measuring 9 by 7 mm, unchanged in size when compared with prior exams dating back to 2018, and considered benign. No consolidation, pleural effusion or pneumothorax. Upper Abdomen: Postsurgical changes of the stomach. No acute abnormality. Musculoskeletal: No chest wall abnormality. No acute or significant osseous findings. Review of the MIP images confirms the above findings. IMPRESSION: 1. No evidence of central pulmonary embolus, evaluation of the segmental and subsegmental pulmonary arteries is limited due to bolus timing and respiratory motion artifact. 2. Diffuse cystic lung disease, compatible with  history of lymphangiomyomatosis. 3. Solid pulmonary nodule of the right upper lobe measuring up to 9 mm unchanged in size when compared with prior exams dating back to 2018 and considered benign. Electronically Signed   By: LYetta GlassmanM.D.   On: 03/06/2022 16:11    Scheduled Meds:    amLODipine  5 mg Oral Daily   enoxaparin (LOVENOX) injection  40 mg Subcutaneous Q24H   insulin aspart  0-15 Units Subcutaneous TID WC   ipratropium-albuterol  3 mL Nebulization TID   pantoprazole  40 mg Oral Daily   predniSONE  40 mg Oral Q breakfast   sirolimus  1 mg Oral BID    Continuous Infusions:     LOS: 0 days     AVernell Leep MD,  FACP, [facility](Care Management Physician Certified) TRay To contact the attending provider between 7A-7P or the covering provider during after hours 7P-7A, please log into the web site [email or web address] and access using universal [place] password for that web site. If you do not  have the password, please call the hospital operator.  03/08/2022, 2:53 PM

## 2022-03-08 NOTE — Progress Notes (Signed)
SATURATION QUALIFICATIONS: (This note is used to comply with regulatory documentation for home oxygen)  Patient Saturations on Room Air at Rest = 94%  Patient Saturations on Room Air while Ambulating = 96%  Patient Saturations on 2 Liters of oxygen while Ambulating = 99%  Please briefly explain why patient needs home oxygen:on room air saturation is above 94%

## 2022-03-08 NOTE — Plan of Care (Signed)
  Problem: Education: Goal: Knowledge of General Education information will improve Description: Including pain rating scale, medication(s)/side effects and non-pharmacologic comfort measures Outcome: Progressing   Problem: Health Behavior/Discharge Planning: Goal: Ability to manage health-related needs will improve Outcome: Progressing   Problem: Clinical Measurements: Goal: Ability to maintain clinical measurements within normal limits will improve Outcome: Progressing Goal: Will remain free from infection Outcome: Progressing Goal: Diagnostic test results will improve Outcome: Progressing Goal: Respiratory complications will improve Outcome: Progressing Goal: Cardiovascular complication will be avoided Outcome: Progressing   Problem: Activity: Goal: Risk for activity intolerance will decrease Outcome: Progressing   Problem: Nutrition: Goal: Adequate nutrition will be maintained Outcome: Progressing   Problem: Coping: Goal: Level of anxiety will decrease Outcome: Progressing   Problem: Elimination: Goal: Will not experience complications related to bowel motility Outcome: Progressing Goal: Will not experience complications related to urinary retention Outcome: Progressing   Problem: Pain Managment: Goal: General experience of comfort will improve Outcome: Progressing   Problem: Safety: Goal: Ability to remain free from injury will improve Outcome: Progressing   Problem: Skin Integrity: Goal: Risk for impaired skin integrity will decrease Outcome: Progressing   Problem: Education: Goal: Knowledge of disease or condition will improve Outcome: Progressing Goal: Knowledge of the prescribed therapeutic regimen will improve Outcome: Progressing Goal: Individualized Educational Video(s) Outcome: Progressing   Problem: Activity: Goal: Ability to tolerate increased activity will improve Outcome: Progressing Goal: Will verbalize the importance of balancing  activity with adequate rest periods Outcome: Progressing   Problem: Respiratory: Goal: Ability to maintain a clear airway will improve Outcome: Progressing Goal: Levels of oxygenation will improve Outcome: Progressing Goal: Ability to maintain adequate ventilation will improve Outcome: Progressing   Problem: Education: Goal: Ability to describe self-care measures that may prevent or decrease complications (Diabetes Survival Skills Education) will improve Outcome: Progressing Goal: Individualized Educational Video(s) Outcome: Progressing   Problem: Coping: Goal: Ability to adjust to condition or change in health will improve Outcome: Progressing   Problem: Fluid Volume: Goal: Ability to maintain a balanced intake and output will improve Outcome: Progressing   Problem: Health Behavior/Discharge Planning: Goal: Ability to identify and utilize available resources and services will improve Outcome: Progressing Goal: Ability to manage health-related needs will improve Outcome: Progressing   Problem: Metabolic: Goal: Ability to maintain appropriate glucose levels will improve Outcome: Progressing   Problem: Nutritional: Goal: Maintenance of adequate nutrition will improve Outcome: Progressing Goal: Progress toward achieving an optimal weight will improve Outcome: Progressing   Problem: Skin Integrity: Goal: Risk for impaired skin integrity will decrease Outcome: Progressing   Problem: Tissue Perfusion: Goal: Adequacy of tissue perfusion will improve Outcome: Progressing

## 2022-03-08 NOTE — Progress Notes (Signed)
  Echocardiogram 2D Echocardiogram has been performed.  Darlina Sicilian M 03/08/2022, 8:16 AM

## 2022-03-09 DIAGNOSIS — J45901 Unspecified asthma with (acute) exacerbation: Secondary | ICD-10-CM | POA: Diagnosis not present

## 2022-03-09 DIAGNOSIS — J9601 Acute respiratory failure with hypoxia: Secondary | ICD-10-CM | POA: Diagnosis not present

## 2022-03-09 DIAGNOSIS — J8481 Lymphangioleiomyomatosis: Secondary | ICD-10-CM | POA: Diagnosis not present

## 2022-03-09 LAB — GLUCOSE, CAPILLARY
Glucose-Capillary: 83 mg/dL (ref 70–99)
Glucose-Capillary: 107 mg/dL — ABNORMAL HIGH (ref 70–99)

## 2022-03-09 MED ORDER — FLUTICASONE PROPIONATE 50 MCG/ACT NA SUSP: 1.0000 | NASAL | Status: DC | PRN

## 2022-03-09 MED ORDER — AMLODIPINE BESYLATE 5 MG PO TABS: 5.0000 mg | ORAL_TABLET | Freq: Every day | ORAL | 0 refills | Status: DC

## 2022-03-09 MED ORDER — PREDNISONE 10 MG PO TABS: ORAL_TABLET | ORAL | 0 refills | Status: DC

## 2022-03-09 NOTE — Discharge Instructions (Signed)

## 2022-03-09 NOTE — Discharge Summary (Signed)
Physician Discharge Summary  MCKINZIE SAKSA LDJ:[id number] DOB: Jan 26, 1981  PCP: Shelda Pal, DO  Admitted from: Home Discharged to: Home  Admit date: 03/06/2022 Discharge date: 03/09/2022  Recommendations for Outpatient Follow-up:    Follow-up Information     Shelda Pal, DO. Schedule an appointment as soon as possible for a visit in 1 week(s).   Specialty: Family Medicine Why: To be seen with repeat labs (CBC & BMP). Contact information: [place] [address] [phone]         Collene Gobble, MD. Schedule an appointment as soon as possible for a visit in 1 week(s).   Specialty: Pulmonary Disease Contact information: [place] [address] [phone]                  Home Health: None    Equipment/Devices: None    Discharge Condition: Improved and stable.   Code Status: Full Code Diet recommendation:  Discharge Diet Orders (From admission, onward)     Start     Ordered   03/09/22 0000  Diet - low sodium heart healthy        03/09/22 1026   03/09/22 0000  Diet Carb Modified        03/09/22 1026             Discharge Diagnoses:  Principal Problem:   Acute respiratory failure with hypoxia ([place]) Active Problems:   Lymphangioleiomyomatosis (HCC)   Mild intermittent asthma   Obstructive sleep apnea   Pulmonary emphysema, unspecified emphysema type (HCC)   GERD (gastroesophageal reflux disease)   Type 2 diabetes mellitus, without long-term current use of insulin (HCC)   Brief Summary: 41 year old female, PMH of mild intermittent asthma, anxiety and depression, GERD, lymphangioleiomyomatosis, PCOS, type II DM, OSA not yet on CPAP, history of COVID-19 infection a few weeks prior, presented to the ED on 03/06/2022 with complaints of progressive dyspnea both at rest and at exertion, productive cough, wheezing, fatigue that started same day.  Home oxygen  saturations 79%, after albuterol inhaler, went up to 88%.  Per ED EP note, had persistent hypoxia with oxygen saturations of 85% with symptoms, on exertion and subsequently 80%.  She received supplemental oxygen, DuoNeb and Solu-Medrol 125 mg IV x1.  CTA chest without central PE but showed known lymphangiomyomatosis.  Admitted for acute respiratory failure with hypoxia due to asthma exacerbation and lymphangiomatosis.  Noncompliant with sirolimus over the past month.      Assessment & Plan:    Acute respiratory failure with hypoxia due to asthma exacerbation: Complicating history of mild intermittent asthma, lymphangioleiomyomatosis and OSA, with history of noncompliance with most therapies as per pulmonology office notes from January 2022.  Currently reports of noncompliance with sirolimus over the past month.  Appears to have chronic DOE.  She feels that current episode may have been precipitated by pressure washing in her apartment complex the day before and she was able to smell the chemicals.  She also worked with some epoxy project at home.  CTA chest: No central PE, diffuse cystic lung disease compatible with history of lymphangioleiomyomatosis, no CO2 retention by VBG, SARS coronavirus 19 and flu panel PCR negative.  S/p Solu-Medrol 125 Mg IVP x1 in ED, continue scheduled Dulera instead of Symbicort, DuoNeb, as needed albuterol nebs, prednisone 40 mg daily and sirolimus.  Outpatient follow-up with Dr. Lamonte Sakai.  Clinically improved.  Breathing almost back to her baseline.  No  further DOE.  Home O2 check within the last 24 hours without hypoxia and did not qualify for home oxygen.  DC on prior home inhalers and a quick steroid taper with close outpatient follow-up with Dr. Lamonte Sakai, pulmonology.   Mild intermittent asthma: Management as above   Lymphangioleiomyomatosis: Continue sirolimus   Elevated troponin secondary to demand ischemia: HS Troponin 43 > 61.  No anginal symptoms.  TTE: LVEF 50-55%    OSA Has not yet received CPAP at home and this seems to be an ongoing issue since at least January 2022.  Tolerated CPAP in the hospital last night, continue.  Patient reports that she turned in her CPAP machine but plans to get it back.   GERD: Continue pantoprazole.   Type II DM, controlled: Appears to be diet controlled.  Not on meds PTA.  A1c 5.3 on 03/07/2022 indicating good control. Has not been taking Trulicity.   Hyperlipidemia No longer taking rosuvastatin, patient preference.   Hypertension Patient started on amlodipine this admission.  Had BPs as high as 154/103.  Improved control.  Continue amlodipine.  Outpatient follow-up.   Anxiety and depression Not on meds at home   RUL solid pulmonary nodule 9 mm: Seen on CTA chest 03/06/2022, per radiology, unchanged compared to prior exams dating back to 2018 and considered benign.  Pulmonology follow-up outpatient.   Normocytic anemia: Stable.   Body mass index is 31.32 kg/m./Obesity Noted.     Consultants:   None   Procedures:   None   Discharge Instructions  Discharge Instructions     Call MD for:  difficulty breathing, headache or visual disturbances   Complete by: As directed    Call MD for:  extreme fatigue   Complete by: As directed    Call MD for:  persistant dizziness or light-headedness   Complete by: As directed    Call MD for:  temperature >100.4   Complete by: As directed    Diet - low sodium heart healthy   Complete by: As directed    Diet Carb Modified   Complete by: As directed    Discharge instructions   Complete by: As directed    Ensure that you procure the CPAP machine and use it regularly.   Increase activity slowly   Complete by: As directed         Medication List     STOP taking these medications    promethazine-dextromethorphan 6.25-15 MG/5ML syrup Commonly known as: PROMETHAZINE-DM   rosuvastatin 20 MG tablet Commonly known as: Crestor   Trulicity 1.5 OE/7.0JJ  Sopn Generic drug: Dulaglutide       TAKE these medications    acetaminophen 325 MG tablet Commonly known as: TYLENOL Take 325-650 mg by mouth 2 (two) times daily as needed (pain).   albuterol 108 (90 Base) MCG/ACT inhaler Commonly known as: VENTOLIN HFA Inhale 2 puffs into the lungs every 4 (four) hours as needed for wheezing or shortness of breath. What changed: when to take this   amLODipine 5 MG tablet Commonly known as: NORVASC Take 1 tablet (5 mg total) by mouth daily. Start taking on: March 10, 2022   ASHWAGANDHA PO Take 1-2 tablets by mouth as needed (anxiety).   budesonide-formoterol 160-4.5 MCG/ACT inhaler Commonly known as: SYMBICORT Inhale 1 puff into the lungs daily. What changed: Another medication with the same name was removed. Continue taking this medication, and follow the directions you see here.   fluticasone 50 MCG/ACT nasal spray Commonly known as: FLONASE  Place 1 spray into both nostrils as needed for allergies.   hydrocortisone cream 1 % Apply 1 Application topically as needed for itching (insect bites).   melatonin 1 MG Tabs tablet Take 2 mg by mouth at bedtime as needed (sleep).   multivitamin with minerals tablet Take 1 tablet by mouth daily.   pantoprazole 40 MG tablet Commonly known as: Protonix Take 1 tablet (40 mg total) by mouth daily.   predniSONE 10 MG tablet Commonly known as: DELTASONE Take 3 tabs (30 mg total) daily x2 days, then 2 tabs (20 mg total) daily x2 days, then 1 tab (10 mg total) daily x2 days, then stop. Start taking on: March 10, 2022   sirolimus 1 MG tablet Commonly known as: RAPAMUNE Take 1 tablet (1 mg total) by mouth 2 (two) times daily.       Allergies  Allergen Reactions   Fentanyl Itching    Benadryl effective for itching.   Hydromorphone Itching      Procedures/Studies: ECHOCARDIOGRAM COMPLETE  Result Date: 03/08/2022    ECHOCARDIOGRAM REPORT   Patient Name:   MALEYAH EVANS Date of  Exam: 03/08/2022 Medical Rec #:  [id number]        Height:       67.0 in Accession #:    [id number]       Weight:       200.0 lb Date of Birth:  [birth year]       BSA:          2.022 m Patient Age:    21 years         BP:           126/88 mmHg Patient Gender: F                HR:           59 bpm. Exam Location:  Inpatient Procedure: 2D Echo, Cardiac Doppler and Color Doppler Indications:    Elevated Troponin  History:        Patient has prior history of Echocardiogram examinations, most                 recent 05/28/2021. Risk Factors:Sleep Apnea. GERD.  Sonographer:    Darlina Sicilian RDCS Referring Phys: [id number] DAVID MANUEL [place]  Sonographer Comments: Unable to obtain SSN. IMPRESSIONS  1. Left ventricular ejection fraction, by estimation, is 50 to 55%. The left ventricle has low normal function. The left ventricle has no regional wall motion abnormalities. Left ventricular diastolic parameters were normal.  2. Right ventricular systolic function is normal. The right ventricular size is normal. Tricuspid regurgitation signal is inadequate for assessing PA pressure.  3. The mitral valve is grossly normal. No evidence of mitral valve regurgitation. No evidence of mitral stenosis.  4. The aortic valve is grossly normal. Aortic valve regurgitation is not visualized. No aortic stenosis is present.  5. The inferior vena cava is normal in size with <50% respiratory variability, suggesting right atrial pressure of 8 mmHg. Comparison(s): Prior images unable to be directly viewed, comparison made by report only. Conclusion(s)/Recommendation(s): Otherwise normal echocardiogram, with minor abnormalities described in the report. FINDINGS  Left Ventricle: Left ventricular ejection fraction, by estimation, is 50 to 55%. The left ventricle has low normal function. The left ventricle has no regional wall motion abnormalities. The left ventricular internal cavity size was normal in size. There is no left ventricular hypertrophy. Left  ventricular diastolic parameters were normal. Right Ventricle: The right ventricular size is  normal. No increase in right ventricular wall thickness. Right ventricular systolic function is normal. Tricuspid regurgitation signal is inadequate for assessing PA pressure. Left Atrium: Left atrial size was normal in size. Right Atrium: Right atrial size was normal in size. Pericardium: There is no evidence of pericardial effusion. Mitral Valve: The mitral valve is grossly normal. No evidence of mitral valve regurgitation. No evidence of mitral valve stenosis. Tricuspid Valve: The tricuspid valve is grossly normal. Tricuspid valve regurgitation is not demonstrated. No evidence of tricuspid stenosis. Aortic Valve: The aortic valve is grossly normal. Aortic valve regurgitation is not visualized. No aortic stenosis is present. Pulmonic Valve: The pulmonic valve was not well visualized. Pulmonic valve regurgitation is not visualized. Aorta: The aortic root and ascending aorta are structurally normal, with no evidence of dilitation and the ascending aorta was not well visualized. Venous: The inferior vena cava is normal in size with less than 50% respiratory variability, suggesting right atrial pressure of 8 mmHg. IAS/Shunts: The atrial septum is grossly normal.  LEFT VENTRICLE PLAX 2D LVIDd:         5.00 cm      Diastology LVIDs:         3.80 cm      LV e' medial:    5.51 cm/s LV PW:         1.10 cm      LV E/e' medial:  9.7 LV IVS:        0.90 cm      LV e' lateral:   8.51 cm/s LVOT diam:     1.90 cm      LV E/e' lateral: 6.3 LV SV:         55 LV SV Index:   27 LVOT Area:     2.84 cm  LV Volumes (MOD) LV vol d, MOD A2C: 143.0 ml LV vol d, MOD A4C: 117.0 ml LV vol s, MOD A2C: 64.9 ml LV vol s, MOD A4C: 60.9 ml LV SV MOD A2C:     78.1 ml LV SV MOD A4C:     117.0 ml LV SV MOD BP:      69.8 ml RIGHT VENTRICLE RV S prime:     8.27 cm/s TAPSE (M-mode): 1.6 cm LEFT ATRIUM             Index        RIGHT ATRIUM           Index LA  diam:        2.80 cm 1.38 cm/m   RA Area:     11.70 cm LA Vol (A2C):   49.4 ml 24.43 ml/m  RA Volume:   24.00 ml  11.87 ml/m LA Vol (A4C):   36.4 ml 18.00 ml/m LA Biplane Vol: 43.8 ml 21.66 ml/m  AORTIC VALVE LVOT Vmax:   96.60 cm/s LVOT Vmean:  64.500 cm/s LVOT VTI:    0.195 m  AORTA Ao Root diam: 3.20 cm MITRAL VALVE MV Area (PHT): 3.08 cm    SHUNTS MV Decel Time: 246 msec    Systemic VTI:  0.20 m MV E velocity: 53.60 cm/s  Systemic Diam: 1.90 cm MV A velocity: 29.60 cm/s MV E/A ratio:  1.81 Buford Dresser MD Electronically signed by Buford Dresser MD Signature Date/Time: 03/08/2022/11:57:45 AM    Final    CT Angio Chest PE W and/or Wo Contrast  Result Date: 03/06/2022 CLINICAL DATA:  Shortness of breath EXAM: CT ANGIOGRAPHY CHEST WITH CONTRAST TECHNIQUE: Multidetector CT imaging of  the chest was performed using the standard protocol during bolus administration of intravenous contrast. Multiplanar CT image reconstructions and MIPs were obtained to evaluate the vascular anatomy. RADIATION DOSE REDUCTION: This exam was performed according to the departmental dose-optimization program which includes automated exposure control, adjustment of the mA and/or kV according to patient size and/or use of iterative reconstruction technique. CONTRAST:  87m OMNIPAQUE IOHEXOL 350 MG/ML SOLN COMPARISON:  Chest CT dated January 29, 2021 FINDINGS: Cardiovascular: No evidence of central pulmonary embolus, evaluation of the segmental and subsegmental pulmonary arteries is limited due to bolus timing and respiratory motion artifact. Normal heart size. No pericardial effusion. Normal caliber thoracic aorta with no atherosclerotic disease. Mediastinum/Nodes: Esophagus and thyroid are unremarkable. No pathologically enlarged lymph nodes seen in the chest. Lungs/Pleura: Central airways are patent. Diffuse thin-walled cysts are seen throughout the lungs. Solid pulmonary nodule of the right upper lobe measuring 9 by 7  mm, unchanged in size when compared with prior exams dating back to 2018, and considered benign. No consolidation, pleural effusion or pneumothorax. Upper Abdomen: Postsurgical changes of the stomach. No acute abnormality. Musculoskeletal: No chest wall abnormality. No acute or significant osseous findings. Review of the MIP images confirms the above findings. IMPRESSION: 1. No evidence of central pulmonary embolus, evaluation of the segmental and subsegmental pulmonary arteries is limited due to bolus timing and respiratory motion artifact. 2. Diffuse cystic lung disease, compatible with history of lymphangiomyomatosis. 3. Solid pulmonary nodule of the right upper lobe measuring up to 9 mm unchanged in size when compared with prior exams dating back to 2018 and considered benign. Electronically Signed   By: LYetta GlassmanM.D.   On: 03/06/2022 16:11   DG Chest 2 View  Result Date: 03/06/2022 CLINICAL DATA:  Shortness of breath with productive cough. Patient reports COVID infection a few weeks ago. EXAM: CHEST - 2 VIEW COMPARISON:  Radiographs 01/25/2021 and 10/31/2020.  CT 01/29/2021. FINDINGS: The heart size and mediastinal contours are stable. There is stable chronic lung disease with diffuse reticulonodular interstitial opacities which have been previously attributed to the patient's lymphangiomyomatosis. No superimposed airspace disease, definite edema, pleural effusion or pneumothorax. The bones appear unchanged. IMPRESSION: Stable chronic lung disease with diffuse interstitial prominence attributed to lymphangiomyomatosis. No evidence of acute superimposed process. Electronically Signed   By: WRichardean SaleM.D.   On: 03/06/2022 14:29      Subjective: Reports 2 episodes of loose stools last night and 2 this morning, feels that this may be due to something she ate.  Abdominal discomfort without pain.  No nausea or vomiting.  Tolerated diet.  Dyspnea improved and feels that her breathing is almost  back to her baseline.  No chest pain or cough reported.  Feels comfortable going home.  Discharge Exam:  Vitals:   03/08/22 1937 03/08/22 2229 03/09/22 0532 03/09/22 0739  BP:  (!) 131/91 138/89   Pulse:  71 (!) 58   Resp:  18 18   Temp:  98.2 F (36.8 C) 97.8 F (36.6 C)   TempSrc:  Oral Oral   SpO2: 94% 96% 94% 92%  Weight:      Height:        General exam: Young female, moderately built and obese sitting up comfortably in bed without distress. Respiratory system: Clear to auscultation.  No increased work of breathing. Cardiovascular system: S1 & S2 heard, RRR. No JVD, murmurs, rubs, gallops or clicks. No pedal edema.  Telemetry personally reviewed: Sinus rhythm. Gastrointestinal system: Abdomen is  nondistended, soft and nontender. No organomegaly or masses felt. Normal bowel sounds heard. Central nervous system: Alert and oriented. No focal neurological deficits. Extremities: Symmetric 5 x 5 power. Skin: No rashes, lesions or ulcers Psychiatry: Judgement and insight appear normal. Mood & affect appropriate.     The results of significant diagnostics from this hospitalization (including imaging, microbiology, ancillary and laboratory) are listed below for reference.     Microbiology: Recent Results (from the past 240 hour(s))  Resp Panel by RT-PCR (Flu A&B, Covid) Anterior Nasal Swab     Status: None   Collection Time: 03/06/22  3:20 PM   Specimen: Anterior Nasal Swab  Result Value Ref Range Status   SARS Coronavirus 2 by RT PCR NEGATIVE NEGATIVE Final    Comment: (NOTE) SARS-CoV-2 target nucleic acids are NOT DETECTED.  The SARS-CoV-2 RNA is generally detectable in upper respiratory specimens during the acute phase of infection. The lowest concentration of SARS-CoV-2 viral copies this assay can detect is 138 copies/mL. A negative result does not preclude SARS-Cov-2 infection and should not be used as the sole basis for treatment or other patient management decisions.  A negative result may occur with  improper specimen collection/handling, submission of specimen other than nasopharyngeal swab, presence of viral mutation(s) within the areas targeted by this assay, and inadequate number of viral copies(<138 copies/mL). A negative result must be combined with clinical observations, patient history, and epidemiological information. The expected result is Negative.  Fact Sheet for Patients:  EntrepreneurPulse.com.au  Fact Sheet for Healthcare Providers:  IncredibleEmployment.be  This test is no t yet approved or cleared by the [place] FDA and  has been authorized for detection and/or diagnosis of SARS-CoV-2 by FDA under an Emergency Use Authorization (EUA). This EUA will remain  in effect (meaning this test can be used) for the duration of the COVID-19 declaration under Section 564(b)(1) of the Act, 21 U.S.C.section 360bbb-3(b)(1), unless the authorization is terminated  or revoked sooner.       Influenza A by PCR NEGATIVE NEGATIVE Final   Influenza B by PCR NEGATIVE NEGATIVE Final    Comment: (NOTE) The Xpert Xpress SARS-CoV-2/FLU/RSV plus assay is intended as an aid in the diagnosis of influenza from Nasopharyngeal swab specimens and should not be used as a sole basis for treatment. Nasal washings and aspirates are unacceptable for Xpert Xpress SARS-CoV-2/FLU/RSV testing.  Fact Sheet for Patients: EntrepreneurPulse.com.au  Fact Sheet for Healthcare Providers: IncredibleEmployment.be  This test is not yet approved or cleared by the [place] FDA and has been authorized for detection and/or diagnosis of SARS-CoV-2 by FDA under an Emergency Use Authorization (EUA). This EUA will remain in effect (meaning this test can be used) for the duration of the COVID-19 declaration under Section 564(b)(1) of the Act, 21 U.S.C. section 360bbb-3(b)(1), unless the authorization  is terminated or revoked.  Performed at [facility], [address] [place], [place] [address]      Labs: CBC: Recent Labs  Lab 03/06/22 1458 03/06/22 1503 03/07/22 1301  WBC 8.6  --  6.3  NEUTROABS 6.6  --   --   HGB 11.4* 12.2 11.8*  HCT 34.6* 36.0 36.9  MCV 86.5  --  88.3  PLT 365  --  676    Basic Metabolic Panel: Recent Labs  Lab 03/06/22 1458 03/06/22 1503 03/07/22 1301 03/08/22 0720  NA 140 142 143 142  K 3.2* 3.5 3.7 3.6  CL 111  --  112* 111  CO2  24  --  23 25  GLUCOSE 85  --  87 104*  BUN 11  --  9 13  CREATININE 0.76  --  0.52 0.61  CALCIUM 8.9  --  9.3 9.1  MG  --   --  2.4  --   PHOS  --   --  4.0  --     Liver Function Tests: Recent Labs  Lab 03/06/22 1458 03/08/22 0720  AST 14* 9*  ALT 12 11  ALKPHOS 57 54  BILITOT 0.6 0.8  PROT 7.0 6.8  ALBUMIN 3.8 3.5    CBG: Recent Labs  Lab 03/08/22 0739 03/08/22 1117 03/08/22 1615 03/08/22 2231 03/09/22 0734  GLUCAP 107* 96 148* 127* 83    Hgb A1c Recent Labs    03/07/22 1301  HGBA1C 5.3     Time coordinating discharge: 25 minutes  SIGNED:  Vernell Leep, MD,  FACP, [facility], [facility], [facility] (Care Management Physician Certified). [facility]  To contact the attending provider between 7A-7P or the covering provider during after hours 7P-7A, please log into the web site [email or web address] and access using universal [place] password for that web site. If you do not have the password, please call the hospital operator.

## 2022-03-10 ENCOUNTER — Telehealth: Payer: Self-pay

## 2022-03-10 NOTE — Telephone Encounter (Signed)
Transition Care Management Follow-up Telephone Call Date of discharge and from where: [place] 03-09-22 Dx: acute respiratory failure with hypoxia How have you been since you were released from the hospital? Doing ok  Any questions or concerns? No  Items Reviewed: Did the pt receive and understand the discharge instructions provided? Yes  Medications obtained and verified? Yes  Other? No  Any new allergies since your discharge? No  Dietary orders reviewed? Yes Do you have support at home? Yes   Home Care and Equipment/Supplies: Were home health services ordered? no If so, what is the name of the agency? na  Has the agency set up a time to come to the patient's home? not applicable Were any new equipment or medical supplies ordered?  No What is the name of the medical supply agency? na Were you able to get the supplies/equipment? not applicable Do you have any questions related to the use of the equipment or supplies? No  Functional Questionnaire: (I = Independent and D = Dependent) ADLs: I  Bathing/Dressing- I  Meal Prep- I  Eating- I  Maintaining continence- I  Transferring/Ambulation- I  Managing Meds- I  Follow up appointments reviewed:  PCP Hospital f/u appt confirmed? Yes  Scheduled to see Riki Sheer DO on 03-18-22 @ 1115am. [facility] f/u appt confirmed? No - pt is aware to make fu appt with pulmonologist. Are transportation arrangements needed? No  If their condition worsens, is the pt aware to call PCP or go to the Emergency Dept.? Yes Was the patient provided with contact information for the PCP's office or ED? Yes Was to pt encouraged to call back with questions or concerns? Yes

## 2022-03-18 ENCOUNTER — Ambulatory Visit: Payer: Managed Care, Other (non HMO) | Admitting: Family Medicine

## 2022-03-18 ENCOUNTER — Encounter: Payer: Self-pay | Admitting: Family Medicine

## 2022-03-18 VITALS — BP 114/72 | HR 82 | Temp 98.2°F | Ht 67.0 in | Wt 184.4 lb

## 2022-03-18 DIAGNOSIS — E119 Type 2 diabetes mellitus without complications: Secondary | ICD-10-CM

## 2022-03-18 DIAGNOSIS — Z09 Encounter for follow-up examination after completed treatment for conditions other than malignant neoplasm: Secondary | ICD-10-CM

## 2022-03-18 NOTE — Progress Notes (Addendum)
Chief Complaint  Patient presents with   Hospitalization Follow-up    Breathing problems     Subjective: Patient is a 41 y.o. female here for hosp f/u.  Patient was admitted to [facility] on 03/06/2022 and discharged on 03/09/2022.  Her admitting diagnosis with acute respiratory failure thought to be related to an asthma exacerbation.  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  She should have follow-up with their team but nothing is scheduled at this time.  She is breathing much better.  She is no longer on any steroids.  She never had any wheezing but did have difficulty catching her breath.  She is not having any chest pain or swelling.  No other upper respiratory symptoms.  Since I saw the patient last, she has lost around 60 pounds following a bariatric Roux-en-Y procedure performed in December 2022.  She feels much better.  Her A1c dropped from 6.5-5.3.  She was placed on Norvasc while in the hospital but has not taken it in the past 2 days.  She has not been checking her blood pressure at home.  She does not check her sugars.  Past Medical History:  Diagnosis Date   Abnormal Pap smear 2006   leep   Anxiety    Arthritis    osteoarthritis   Asthma    Complication of anesthesia    2014 d+c [facility], HAD ??  SEIZURE/ SHAKING   Cough    Depression    Fatigue    GERD (gastroesophageal reflux disease)    Herpes    never had outbreak. pos per blood, doesn't know which type   Lactose intolerance    Lymphangiomatosis    Polycystic ovarian syndrome    Prediabetes    Sleep apnea    uses CPAP   SVD (spontaneous vaginal delivery)    x 1   Varicose vein of leg    Vitamin D deficiency     Objective: BP 114/72   Pulse 82   Temp 98.2 F (36.8 C) (Oral)   Ht '5\' 7"'$  (1.702 m)   Wt 184 lb 6 oz (83.6 kg)   LMP 02/06/2022 (Approximate)   SpO2 97%   BMI 28.88 kg/m  General: Awake, appears stated age Heart: RRR, no LE edema Lungs:  CTAB, no rales, wheezes or rhonchi. No accessory muscle use Psych: Age appropriate judgment and insight, normal affect and mood  Assessment and Plan: Hospital discharge follow-up  Diabetes mellitus in remission ([place])  Seems to be improving well from discharge.  I do not see anything scheduled with the pulmonology team.  She will reach out today to schedule a follow-up appointment with them. She is in remission.  Because of this, I would not push her statin and will stop her diabetes health maintenance topics.  She will monitor her blood pressure moving forward for the next few weeks.  If she does require any form of blood pressure lowering agent, would start with an angiotensin receptor blocker. I will tentatively plan to see her in 6 months for a physical and see her yearly after that unless needed. The patient voiced understanding and agreement to the plan.  I spent 30 minutes with the patient discussing the above plans in addition to reviewing her chart/hospitalization on the same day of the visit.  [place], DO 03/18/22  12:10 PM

## 2022-03-18 NOTE — Patient Instructions (Addendum)
Very strong work with your weight loss.   Please call Dr. Agustina Caroli office for your follow up. I don't see anything scheduled at this time.   Stop the blood pressure medicine. Check your blood pressures 2-3 times per week, alternating the time of day you check it. If it is high, considering waiting 1-2 minutes and rechecking. If it gets higher, your anxiety is likely creeping up and we should avoid rechecking. Let me know if it goes up again.   Find out if your gynecologist screened you for HPV. If they did and it was normal, you are good until 2025. I would also want to know.   Let us know if you need anything.

## 2022-04-08 DIAGNOSIS — F4322 Adjustment disorder with anxiety: Secondary | ICD-10-CM | POA: Diagnosis not present

## 2022-04-10 ENCOUNTER — Encounter: Payer: Self-pay | Admitting: Family Medicine

## 2022-05-13 DIAGNOSIS — N760 Acute vaginitis: Secondary | ICD-10-CM | POA: Diagnosis not present

## 2022-05-13 DIAGNOSIS — Z113 Encounter for screening for infections with a predominantly sexual mode of transmission: Secondary | ICD-10-CM | POA: Diagnosis not present

## 2022-06-29 ENCOUNTER — Encounter (HOSPITAL_BASED_OUTPATIENT_CLINIC_OR_DEPARTMENT_OTHER): Payer: Self-pay | Admitting: Emergency Medicine

## 2022-06-29 ENCOUNTER — Emergency Department (HOSPITAL_BASED_OUTPATIENT_CLINIC_OR_DEPARTMENT_OTHER)
Admission: EM | Admit: 2022-06-29 | Discharge: 2022-06-29 | Disposition: A | Discharge status: Home / Self Care | Payer: BC Managed Care – PPO | Attending: Emergency Medicine | Admitting: Emergency Medicine

## 2022-06-29 ENCOUNTER — Other Ambulatory Visit: Payer: Self-pay

## 2022-06-29 DIAGNOSIS — J111 Influenza due to unidentified influenza virus with other respiratory manifestations: Secondary | ICD-10-CM

## 2022-06-29 DIAGNOSIS — Z20822 Contact with and (suspected) exposure to covid-19: Secondary | ICD-10-CM | POA: Diagnosis not present

## 2022-06-29 DIAGNOSIS — J101 Influenza due to other identified influenza virus with other respiratory manifestations: Secondary | ICD-10-CM | POA: Diagnosis not present

## 2022-06-29 DIAGNOSIS — R0602 Shortness of breath: Secondary | ICD-10-CM | POA: Diagnosis not present

## 2022-06-29 LAB — RESP PANEL BY RT-PCR (RSV, FLU A&B, COVID)  RVPGX2
Influenza A by PCR: NEGATIVE
Influenza B by PCR: POSITIVE — AB
Resp Syncytial Virus by PCR: NEGATIVE
SARS Coronavirus 2 by RT PCR: NEGATIVE

## 2022-06-29 MED ORDER — OSELTAMIVIR PHOSPHATE 75 MG PO CAPS: 75.0000 mg | ORAL_CAPSULE | Freq: Two times a day (BID) | ORAL | 0 refills | Status: DC

## 2022-06-29 MED ORDER — ACETAMINOPHEN 325 MG PO TABS
650.0000 mg | ORAL_TABLET | Freq: Once | ORAL | Status: AC
Start: 2022-06-29 — End: 2022-06-29
  Administered 2022-06-29: 650 mg via ORAL
  Filled 2022-06-29: qty 2

## 2022-06-29 MED ORDER — BENZONATATE 100 MG PO CAPS: 100.0000 mg | ORAL_CAPSULE | Freq: Three times a day (TID) | ORAL | 0 refills | Status: DC

## 2022-06-29 NOTE — ED Notes (Signed)
RT assessed patient in triage. Stated she has been more SOB with exertion. SAT 100%, BBS clear. Placed patient back in waiting room after assessment.

## 2022-06-29 NOTE — Discharge Instructions (Addendum)
As we discussed, you tested positive for the flu today.  As this is a viral illness, no antibiotics are indicated.  I recommend that you get plenty of rest and focus on symptomatic relief which includes Cepacol throat lozenges for sore throat, Mucinex D (orange box) which you can get from behind the counter at your local pharmacy for congestion, and tylenol/ibuprofen as needed for fevers and bodyaches. I have also given you a prescription for Tamiflu which is a antiviral flu medication for you to take as prescribed for management of your symptoms.  If this causes stomach upset, please discontinue this drug.  Have also given you Tessalon which is a cough suppressant medication for you to take as prescribed as needed.  I also recommend:  Increased fluid intake. Sports drinks offer valuable electrolytes, sugars, and fluids.  Breathing heated mist or steam (vaporizer or shower).  Eating chicken soup or other clear broths, and maintaining good nutrition.   Increasing usage of your inhaler if you have asthma.  Return to work when your temperature has returned to normal.  Gargle warm salt water and spit it out for sore throat. Take benadryl or Zyrtec to decrease sinus secretions.  Follow Up: Follow up with your primary care doctor in 5-7 days for recheck of ongoing symptoms.  Return to emergency department for emergent changing or worsening of symptoms.

## 2022-06-29 NOTE — ED Provider Notes (Signed)
[facility] DEPART[facility] Provider Note   CSN: [id number] Arrival date & time: 06/29/22  1607     History {Add pertinent medical, surgical, social history, OB history to HPI:1} Chief Complaint  Patient presents with   Shortness of Breath    Brenda Williams is a 41 y.o. female.  Patient with history of lymphangioleiomyomatosis    Shortness of Breath      Home Medications Prior to Admission medications   Medication Sig Start Date End Date Taking? Authorizing Provider  acetaminophen (TYLENOL) 325 MG tablet Take 325-650 mg by mouth 2 (two) times daily as needed (pain).    [provider]  albuterol (VENTOLIN HFA) 108 (90 Base) MCG/ACT inhaler Inhale 2 puffs into the lungs every 4 (four) hours as needed for wheezing or shortness of breath. Patient taking differently: Inhale 2 puffs into the lungs as needed for wheezing or shortness of breath. 09/30/19   Collene Gobble, MD  ASHWAGANDHA PO Take 1-2 tablets by mouth as needed (anxiety).    [provider]  budesonide-formoterol (SYMBICORT) 160-4.5 MCG/ACT inhaler Inhale 1 puff into the lungs daily.    [provider]  fluticasone (FLONASE) 50 MCG/ACT nasal spray Place 1 spray into both nostrils as needed for allergies. 03/09/22   Hongalgi, Lenis Dickinson, MD  melatonin 1 MG TABS tablet Take 2 mg by mouth at bedtime as needed (sleep).    [provider]  Multiple Vitamins-Minerals (MULTIVITAMIN WITH MINERALS) tablet Take 1 tablet by mouth daily.    [provider]  pantoprazole (PROTONIX) 40 MG tablet Take 1 tablet (40 mg total) by mouth daily. 10/01/21   Shelda Pal, DO  sirolimus (RAPAMUNE) 1 MG tablet Take 1 tablet (1 mg total) by mouth 2 (two) times daily. 03/24/20   Collene Gobble, MD  phentermine (ADIPEX-P) 37.5 MG tablet Take 1 tablet (37.5 mg total) by mouth daily before breakfast. 01/08/18 06/02/19  Shelda Pal, DO      Allergies    Fentanyl and  Hydromorphone    Review of Systems   Review of Systems  Respiratory:  Positive for shortness of breath.     Physical Exam Updated Vital Signs BP (!) 132/92   Pulse 98   Temp 99.7 F (37.6 C) (Oral)   Resp 20   Wt 79.4 kg   LMP 06/26/2022   SpO2 96%   BMI 27.41 kg/m  Physical Exam  ED Results / Procedures / Treatments   Labs (all labs ordered are listed, but only abnormal results are displayed) Labs Reviewed  RESP PANEL BY RT-PCR (RSV, FLU A&B, COVID)  RVPGX2 - Abnormal; Notable for the following components:      Result Value   Influenza B by PCR POSITIVE (*)    All other components within normal limits    EKG EKG Interpretation  Date/Time:  Sunday June 29 2022 16:50:27 EST Ventricular Rate:  95 PR Interval:  152 QRS Duration: 78 QT Interval:  382 QTC Calculation: 480 R Axis:   48 Text Interpretation: Normal sinus rhythm T wave abnormality, consider inferior ischemia T wave abnormality, consider anterolateral ischemia Prolonged QT Abnormal ECG When compared with ECG of 06-Mar-2022 13:59, prolonged qt is new Confirmed by Margaretmary Eddy [phone]) on 06/29/2022 5:11:15 PM  Radiology No results found.  Procedures Procedures  {Document cardiac monitor, telemetry assessment procedure when appropriate:1}  Medications Ordered in ED Medications  acetaminophen (TYLENOL) tablet 650 mg (650 mg Oral Given 06/29/22 2024)    ED  Course/ Medical Decision Making/ A&P                           Medical Decision Making Risk OTC drugs.   ***  {Document critical care time when appropriate:1} {Document review of labs and clinical decision tools ie heart score, Chads2Vasc2 etc:1}  {Document your independent review of radiology images, and any outside records:1} {Document your discussion with family members, caretakers, and with consultants:1} {Document social determinants of health affecting pt's care:1} {Document your decision making why or why not admission,  treatments were needed:1} Final Clinical Impression(s) / ED Diagnoses Final diagnoses:  None    Rx / DC Orders ED Discharge Orders     None

## 2022-06-29 NOTE — ED Triage Notes (Signed)
Pt arrives pov, slow gait, c/o shob and tachycardia with exertion. Also reports shob increases when lying down. Pt denies CP, endorses generalized body aches

## 2022-06-29 NOTE — ED Notes (Signed)
Pt ambulated one lap around nurses station and O2 sat was between 93-95% with a HR of 109 at the highest.

## 2022-07-01 DIAGNOSIS — Z713 Dietary counseling and surveillance: Secondary | ICD-10-CM | POA: Diagnosis not present

## 2022-07-01 DIAGNOSIS — Z9884 Bariatric surgery status: Secondary | ICD-10-CM | POA: Diagnosis not present

## 2022-09-02 DIAGNOSIS — Z113 Encounter for screening for infections with a predominantly sexual mode of transmission: Secondary | ICD-10-CM | POA: Diagnosis not present

## 2022-09-02 DIAGNOSIS — Z124 Encounter for screening for malignant neoplasm of cervix: Secondary | ICD-10-CM | POA: Diagnosis not present

## 2022-09-02 DIAGNOSIS — Z01419 Encounter for gynecological examination (general) (routine) without abnormal findings: Secondary | ICD-10-CM | POA: Diagnosis not present

## 2022-09-02 DIAGNOSIS — N760 Acute vaginitis: Secondary | ICD-10-CM | POA: Diagnosis not present

## 2022-09-02 DIAGNOSIS — Z6827 Body mass index (BMI) 27.0-27.9, adult: Secondary | ICD-10-CM | POA: Diagnosis not present

## 2022-09-30 ENCOUNTER — Ambulatory Visit: Payer: BC Managed Care – PPO | Admitting: Emergency Medicine

## 2022-10-09 ENCOUNTER — Emergency Department (HOSPITAL_BASED_OUTPATIENT_CLINIC_OR_DEPARTMENT_OTHER): Payer: BC Managed Care – PPO

## 2022-10-09 ENCOUNTER — Emergency Department (HOSPITAL_BASED_OUTPATIENT_CLINIC_OR_DEPARTMENT_OTHER)
Admission: EM | Admit: 2022-10-09 | Discharge: 2022-10-09 | Disposition: A | Discharge status: Home / Self Care | Payer: BC Managed Care – PPO | Attending: Emergency Medicine | Admitting: Emergency Medicine

## 2022-10-09 ENCOUNTER — Encounter (HOSPITAL_BASED_OUTPATIENT_CLINIC_OR_DEPARTMENT_OTHER): Payer: Self-pay

## 2022-10-09 ENCOUNTER — Other Ambulatory Visit: Payer: Self-pay

## 2022-10-09 DIAGNOSIS — A499 Bacterial infection, unspecified: Secondary | ICD-10-CM | POA: Diagnosis not present

## 2022-10-09 DIAGNOSIS — R319 Hematuria, unspecified: Secondary | ICD-10-CM | POA: Diagnosis not present

## 2022-10-09 DIAGNOSIS — N3001 Acute cystitis with hematuria: Secondary | ICD-10-CM

## 2022-10-09 DIAGNOSIS — E162 Hypoglycemia, unspecified: Secondary | ICD-10-CM | POA: Diagnosis not present

## 2022-10-09 DIAGNOSIS — Z7951 Long term (current) use of inhaled steroids: Secondary | ICD-10-CM | POA: Diagnosis not present

## 2022-10-09 DIAGNOSIS — N390 Urinary tract infection, site not specified: Secondary | ICD-10-CM | POA: Diagnosis not present

## 2022-10-09 DIAGNOSIS — J45909 Unspecified asthma, uncomplicated: Secondary | ICD-10-CM | POA: Diagnosis not present

## 2022-10-09 LAB — CBC WITH DIFFERENTIAL/PLATELET
Abs Immature Granulocytes: 0.01 10*3/uL (ref 0.00–0.07)
Basophils Absolute: 0 10*3/uL (ref 0.0–0.1)
Basophils Relative: 1 %
Eosinophils Absolute: 0 10*3/uL (ref 0.0–0.5)
Eosinophils Relative: 0 %
HCT: 35.9 % — ABNORMAL LOW (ref 36.0–46.0)
Hemoglobin: 11.2 g/dL — ABNORMAL LOW (ref 12.0–15.0)
Immature Granulocytes: 0 %
Lymphocytes Relative: 25 %
Lymphs Abs: 1.9 10*3/uL (ref 0.7–4.0)
MCH: 24.9 pg — ABNORMAL LOW (ref 26.0–34.0)
MCHC: 31.2 g/dL (ref 30.0–36.0)
MCV: 80 fL (ref 80.0–100.0)
Monocytes Absolute: 0.3 10*3/uL (ref 0.1–1.0)
Monocytes Relative: 5 %
Neutro Abs: 5 10*3/uL (ref 1.7–7.7)
Neutrophils Relative %: 69 %
Platelets: 383 10*3/uL (ref 150–400)
RBC: 4.49 MIL/uL (ref 3.87–5.11)
RDW: 16.3 % — ABNORMAL HIGH (ref 11.5–15.5)
WBC: 7.3 10*3/uL (ref 4.0–10.5)
nRBC: 0 % (ref 0.0–0.2)

## 2022-10-09 LAB — URINALYSIS, MICROSCOPIC (REFLEX): RBC / HPF: >50 RBC/hpf (ref 0–5)

## 2022-10-09 LAB — URINALYSIS, ROUTINE W REFLEX MICROSCOPIC
Bilirubin Urine: NEGATIVE
Glucose, UA: NEGATIVE mg/dL
Ketones, ur: 15 mg/dL — AB
Nitrite: NEGATIVE
Protein, ur: 100 mg/dL — AB
Specific Gravity, Urine: 1.02 (ref 1.005–1.030)
pH: 6 (ref 5.0–8.0)

## 2022-10-09 LAB — COMPREHENSIVE METABOLIC PANEL
ALT: 16 U/L (ref 0–44)
AST: 17 U/L (ref 15–41)
Albumin: 4 g/dL (ref 3.5–5.0)
Alkaline Phosphatase: 62 U/L (ref 38–126)
Anion gap: 8 (ref 5–15)
BUN: 10 mg/dL (ref 6–20)
CO2: 23 mmol/L (ref 22–32)
Calcium: 9.1 mg/dL (ref 8.9–10.3)
Chloride: 105 mmol/L (ref 98–111)
Creatinine, Ser: 0.82 mg/dL (ref 0.44–1.00)
GFR, Estimated: >60 mL/min (ref >60)
Glucose, Bld: 46 mg/dL — ABNORMAL LOW (ref 70–99)
Potassium: 3.2 mmol/L — ABNORMAL LOW (ref 3.5–5.1)
Sodium: 136 mmol/L (ref 135–145)
Total Bilirubin: 0.4 mg/dL (ref 0.3–1.2)
Total Protein: 7.7 g/dL (ref 6.5–8.1)

## 2022-10-09 LAB — PREGNANCY, URINE: Preg Test, Ur: NEGATIVE

## 2022-10-09 LAB — CBG MONITORING, ED: Glucose-Capillary: 104 mg/dL — ABNORMAL HIGH (ref 70–99)

## 2022-10-09 MED ORDER — POTASSIUM CHLORIDE CRYS ER 20 MEQ PO TBCR
40.0000 meq | EXTENDED_RELEASE_TABLET | Freq: Once | ORAL | Status: AC
  Administered 2022-10-09: 40 meq via ORAL
  Filled 2022-10-09: qty 2

## 2022-10-09 NOTE — ED Notes (Signed)
Pt. Reports going to UC today for UTI.    Pt. Has had Abx injection for UTI Dx.  No pain per pt. At  present time.  Pt. Was told to come to the ER due to her call back to the UC due to her being sleepy after being seen at the [facility] and having the abx. Injection.  Pt. In no distress and reports she is able to urinate.

## 2022-10-09 NOTE — ED Triage Notes (Signed)
Pt presents with complaint of blood in urine. Upon waking urine brown in color. Went to UC and told to come here. Pt given shot of antibiotic for UTI and then felt weak so advised to come to ED

## 2022-10-09 NOTE — Discharge Instructions (Signed)
Note the visit emergency department today was overall reassuring.  No evidence of kidney stone or other intra-abdominal cause of blood in urine.  Recommend continued use of antibiotic prescribed by urgent care in the form of Bactrim.  Also recommend eating adequate regular meals due to concern of your blood sugar dropping as it was low today while in the emergency department.  Will attach information for urology to follow-up with regarding blood in urine.  Please do not hesitate to return to emergency department for worrisome signs and symptoms we discussed become apparent.

## 2022-10-09 NOTE — ED Provider Notes (Signed)
[facility] Provider Note   CSN: [id number] Arrival date & time: 10/09/22  1233     History  Chief Complaint  Patient presents with   Hematuria    Brenda Williams is a 42 y.o. female.   Hematuria   42 year old female presents emergency department with complaints of hematuria.  Patient was seen at urgent care earlier today and diagnosed with urinary tract infection with hematuria and treated with 1 shot of Rocephin and given Bactrim to take from then.  Patient noted feeling "sleepy" after injection and was told to come the emergency department for further evaluation.  Patient noted urinary symptoms beginning earlier today upon awakening.  She denies any dysuria, urinary frequency/urgency but noticed dark-colored urine upon awakening.  Patient reports intermittent left sided abdominal cramping type sensation occurring since prior gastric bypass of which she is experiencing some over the past 2 days as well as low back pain that remains persistent since diagnosis of degenerative disc disease of lumbar spine.  There is no acute worsening of either symptom.  Denies fever, chills, night sweats, chest pain, shortness of breath, nausea, vomiting, change in bowel habits.  Past medical history significant for OSA, polycystic ovarian disease, lymphangiomatosis, GERD, depression, asthma, anxiety, obesity  Home Medications Prior to Admission medications   Medication Sig Start Date End Date Taking? Authorizing Provider  acetaminophen (TYLENOL) 325 MG tablet Take 325-650 mg by mouth 2 (two) times daily as needed (pain).    [provider]  albuterol (VENTOLIN HFA) 108 (90 Base) MCG/ACT inhaler Inhale 2 puffs into the lungs every 4 (four) hours as needed for wheezing or shortness of breath. Patient taking differently: Inhale 2 puffs into the lungs as needed for wheezing or shortness of breath. 09/30/19   Collene Gobble, MD  ASHWAGANDHA PO Take 1-2  tablets by mouth as needed (anxiety).    [provider]  benzonatate (TESSALON) 100 MG capsule Take 1 capsule (100 mg total) by mouth every 8 (eight) hours. 06/29/22   Smoot, Leary Roca, PA-C  budesonide-formoterol (SYMBICORT) 160-4.5 MCG/ACT inhaler Inhale 1 puff into the lungs daily.    [provider]  fluticasone (FLONASE) 50 MCG/ACT nasal spray Place 1 spray into both nostrils as needed for allergies. 03/09/22   Hongalgi, Lenis Dickinson, MD  melatonin 1 MG TABS tablet Take 2 mg by mouth at bedtime as needed (sleep).    [provider]  Multiple Vitamins-Minerals (MULTIVITAMIN WITH MINERALS) tablet Take 1 tablet by mouth daily.    [provider]  oseltamivir (TAMIFLU) 75 MG capsule Take 1 capsule (75 mg total) by mouth every 12 (twelve) hours. 06/29/22   Smoot, Leary Roca, PA-C  pantoprazole (PROTONIX) 40 MG tablet Take 1 tablet (40 mg total) by mouth daily. 10/01/21   Shelda Pal, DO  sirolimus (RAPAMUNE) 1 MG tablet Take 1 tablet (1 mg total) by mouth 2 (two) times daily. 03/24/20   Collene Gobble, MD  phentermine (ADIPEX-P) 37.5 MG tablet Take 1 tablet (37.5 mg total) by mouth daily before breakfast. 01/08/18 06/02/19  Shelda Pal, DO      Allergies    Fentanyl and Hydromorphone    Review of Systems   Review of Systems  Genitourinary:  Positive for hematuria.  All other systems reviewed and are negative.   Physical Exam Updated Vital Signs BP 114/71   Pulse 84   Temp 98 F (36.7 C) (Oral)   Resp 17   Ht  5\' 7"  (1.702 m)   Wt 80.7 kg   LMP 09/26/2022   SpO2 96%   BMI 27.88 kg/m  Physical Exam Vitals and nursing note reviewed.  Constitutional:      General: She is not in acute distress.    Appearance: She is well-developed.  HENT:     Head: Normocephalic and atraumatic.  Eyes:     Conjunctiva/sclera: Conjunctivae normal.  Cardiovascular:     Rate and Rhythm: Normal rate and regular rhythm.     Heart sounds: No murmur  heard. Pulmonary:     Effort: Pulmonary effort is normal. No respiratory distress.     Breath sounds: Normal breath sounds.  Abdominal:     Palpations: Abdomen is soft.     Tenderness: There is no right CVA tenderness or left CVA tenderness.     Comments: Mild suprapubic tenderness.  Musculoskeletal:        General: No swelling.     Cervical back: Neck supple.     Right lower leg: No edema.     Left lower leg: No edema.  Skin:    General: Skin is warm and dry.     Capillary Refill: Capillary refill takes less than 2 seconds.  Neurological:     Mental Status: She is alert.  Psychiatric:        Mood and Affect: Mood normal.     ED Results / Procedures / Treatments   Labs (all labs ordered are listed, but only abnormal results are displayed) Labs Reviewed  URINALYSIS, ROUTINE W REFLEX MICROSCOPIC - Abnormal; Notable for the following components:      Result Value   Color, Urine AMBER (*)    APPearance HAZY (*)    Hgb urine dipstick LARGE (*)    Ketones, ur 15 (*)    Protein, ur 100 (*)    Leukocytes,Ua SMALL (*)    All other components within normal limits  COMPREHENSIVE METABOLIC PANEL - Abnormal; Notable for the following components:   Potassium 3.2 (*)    Glucose, Bld 46 (*)    All other components within normal limits  CBC WITH DIFFERENTIAL/PLATELET - Abnormal; Notable for the following components:   Hemoglobin 11.2 (*)    HCT 35.9 (*)    MCH 24.9 (*)    RDW 16.3 (*)    All other components within normal limits  URINALYSIS, MICROSCOPIC (REFLEX) - Abnormal; Notable for the following components:   Bacteria, UA RARE (*)    All other components within normal limits  CBG MONITORING, ED - Abnormal; Notable for the following components:   Glucose-Capillary 104 (*)    All other components within normal limits  URINE CULTURE  PREGNANCY, URINE    EKG None  Radiology CT Renal Stone Study  Result Date: 10/09/2022 CLINICAL DATA:  Abdominal and flank pain.  Hematuria.  EXAM: CT ABDOMEN AND PELVIS WITHOUT CONTRAST TECHNIQUE: Multidetector CT imaging of the abdomen and pelvis was performed following the standard protocol without IV contrast. RADIATION DOSE REDUCTION: This exam was performed according to the departmental dose-optimization program which includes automated exposure control, adjustment of the mA and/or kV according to patient size and/or use of iterative reconstruction technique. COMPARISON:  07/01/2021 FINDINGS: Lower chest: Diffuse cystic lung disease at the bases with history of lymphangiomyomatosis, no change from prior exams. Hepatobiliary: Unremarkable Pancreas: Unremarkable Spleen: Unremarkable Adrenals/Urinary Tract: No urinary tract calculi or significant findings to explain the patient's hematuria. No hydronephrosis. Adrenal glands unremarkable. Stomach/Bowel: Gastric bypass, with no noncontrast  CT findings of complicating feature. Normal appendix. No dilated bowel. Vascular/Lymphatic: Unremarkable Reproductive: Unremarkable Other: Postoperative findings in the subcutaneous tissues compatible with prior liposuction/abdominoplasty. Musculoskeletal: Spondylosis and degenerative disc disease at L5-S1 with endplate sclerosis and mild endplate irregularity due to Schmorl's nodes along the inferior endplate of L5. Intervertebral and facet spurring contribute to mild bilateral foraminal impingement at this level. IMPRESSION: 1. A specific cause for the patient's hematuria is not identified on today's noncontrast CT. 2. Diffuse cystic lung disease at the bases with history of lymphangiomyomatosis, no change from prior exams. 3. Gastric bypass, without complicating feature. 4. Spondylosis and degenerative disc disease at L5-S1 causing mild bilateral foraminal impingement. Electronically Signed   By: Van Clines M.D.   On: 10/09/2022 14:36    Procedures Procedures    Medications Ordered in ED Medications  potassium chloride SA (KLOR-CON M) CR tablet 40  mEq (40 mEq Oral Given 10/09/22 1454)    ED Course/ Medical Decision Making/ A&P                             Medical Decision Making Amount and/or Complexity of Data Reviewed Labs: ordered. Radiology: ordered.  Risk Prescription drug management.   This patient presents to the ED for concern of hematuria, this involves an extensive number of treatment options, and is a complaint that carries with it a high risk of complications and morbidity.  The differential diagnosis includes bilateral redness, cystitis, nephrolithiasis, nephritis, renal cyst   Co morbidities that complicate the patient evaluation  See HPI   Additional history obtained:  Additional history obtained from EMR External records from outside source obtained and reviewed including hospital records   Lab Tests:  I Ordered, and personally interpreted labs.  The pertinent results include: No leukocytosis noted.  Mildly distended hemoglobin 11.20 which is near patient's baseline.  Platelets within normal range.   Imaging Studies ordered:  I ordered imaging studies including CT renal stone study I independently visualized and interpreted imaging which showed no acute abnormalities.  Diffuse cystic lung disease.  Gastric bypass.  Spondylosis and degenerative disc disease at L5-S1 I agree with the radiologist interpretation  Cardiac Monitoring: / EKG:  The patient was maintained on a cardiac monitor.  I personally viewed and interpreted the cardiac monitored which showed an underlying rhythm of: Sinus rhythm   Consultations Obtained:  N/a   Problem List / ED Course / Critical interventions / Medication management  Cystitis with hematuria I ordered medication including potassium chloride   Reevaluation of the patient after these medicines showed that the patient improved I have reviewed the patients home medicines and have made adjustments as needed   Social Determinants of Health:  Denies tobacco,  licit drug use   Test / Admission - Considered:  Cystitis with hematuria Vitals signs within normal range and stable throughout visit. Laboratory/imaging studies significant for: See above Patient with evidence of urinary tract infection with hematuria.  No evidence of acute abnormalities on CT scan of the abdomen or pelvis.  Patient already on Bactrim as prescribed by urgent care.  Blood glucose was found to be low initially of 46 most likely secondary to decreased p.o. intake and history of gastric bypass.  Glucose promptly corrected with oral supplementation to 104 upon discharge.  Patient's perceived weakness most likely secondary to hypoglycemia given improvement of symptoms with consumption of food/liquids while in the ED.  Patient recommended continued antibiotic therapy in the form  of Bactrim as well as follow-up with urology outpatient.  Patient overall well-appearing, afebrile in no acute distress with no evidence of acute renal injury.  Treatment plan discussed at length with patient and she acknowledged understanding was agreeable to said plan. Worrisome signs and symptoms were discussed with the patient, and the patient acknowledged understanding to return to the ED if noticed. Patient was stable upon discharge.          Final Clinical Impression(s) / ED Diagnoses Final diagnoses:  Hypoglycemia  Acute cystitis with hematuria    Rx / DC Orders ED Discharge Orders     None         Wilnette Kales, [place] 10/09/22 1754    Fredia Sorrow, MD 10/10/22 [phone]

## 2022-10-09 NOTE — ED Notes (Signed)
Called lab to add urine culture onto previously collected urine 

## 2022-10-11 LAB — URINE CULTURE: Culture: <10000 — AB

## 2022-10-16 DIAGNOSIS — N879 Dysplasia of cervix uteri, unspecified: Secondary | ICD-10-CM | POA: Diagnosis not present

## 2022-10-16 DIAGNOSIS — R87610 Atypical squamous cells of undetermined significance on cytologic smear of cervix (ASC-US): Secondary | ICD-10-CM | POA: Diagnosis not present

## 2022-10-16 DIAGNOSIS — N870 Mild cervical dysplasia: Secondary | ICD-10-CM | POA: Diagnosis not present

## 2022-11-18 DIAGNOSIS — N390 Urinary tract infection, site not specified: Secondary | ICD-10-CM | POA: Diagnosis not present

## 2022-11-18 DIAGNOSIS — N898 Other specified noninflammatory disorders of vagina: Secondary | ICD-10-CM | POA: Diagnosis not present

## 2022-11-28 DIAGNOSIS — Z1231 Encounter for screening mammogram for malignant neoplasm of breast: Secondary | ICD-10-CM | POA: Diagnosis not present

## 2022-11-28 LAB — HM MAMMOGRAPHY

## 2022-12-22 ENCOUNTER — Encounter: Payer: Self-pay | Admitting: Family Medicine

## 2022-12-22 ENCOUNTER — Ambulatory Visit: Payer: BC Managed Care – PPO | Admitting: Family Medicine

## 2022-12-22 VITALS — BP 121/74 | HR 86 | Ht 67.0 in | Wt 179.0 lb

## 2022-12-22 DIAGNOSIS — K649 Unspecified hemorrhoids: Secondary | ICD-10-CM

## 2022-12-22 DIAGNOSIS — K645 Perianal venous thrombosis: Secondary | ICD-10-CM | POA: Diagnosis not present

## 2022-12-22 MED ORDER — HYDROCORTISONE ACETATE 25 MG RE SUPP: 25.0000 mg | Freq: Two times a day (BID) | RECTAL | 0 refills | Status: DC

## 2022-12-22 MED ORDER — HYDROCORTISONE (PERIANAL) 2.5 % EX CREA: 1.0000 | TOPICAL_CREAM | Freq: Two times a day (BID) | CUTANEOUS | 0 refills | Status: DC

## 2022-12-22 NOTE — Progress Notes (Signed)
Acute Office Visit  Subjective:     Patient ID: Brenda Williams, female    DOB: 12/07/1980, 42 y.o.   MRN: [id number]  Chief Complaint  Patient presents with   GI Problem     Discussed the use of AI scribe software for clinical note transcription with the patient, who gave verbal consent to proceed.  History of Present Illness   The patient, a gastric bypass recipient, presents with recent onset of severe, stabbing abdominal pain and a suspected external hemorrhoid. The abdominal pain, which lasted for about four hours, was severe enough to disrupt sleep despite the use of a heating pad and Tylenol PM. The pain resolved upon waking, but was replaced by nausea and dizziness, which led to early departure from a church service. The patient denies constipation and reports regular bowel movements, typically twice daily. They have a history of hemorrhoids, but have not experienced them in several years. The suspected hemorrhoid is described as hard and painful, making it difficult to wipe after bowel movements. The patient has been applying Vaseline for comfort. They deny any blood in the stool or any other changes in bowel habits. The patient also reports consuming a ginger ale, which is unusual for them, on the day the abdominal pain began.  Today she is feeling fine other than the hemorrhoid.         ROS All review of systems negative except what is listed in the HPI      Objective:    BP 121/74   Pulse 86   Ht 5\' 7"  (1.702 m)   Wt 179 lb (81.2 kg)   SpO2 98%   BMI 28.04 kg/m    Physical Exam Vitals reviewed.  Constitutional:      Appearance: Normal appearance.  Pulmonary:     Effort: Pulmonary effort is normal.  Abdominal:     General: Abdomen is flat. Bowel sounds are normal. There is no distension.     Palpations: Abdomen is soft. There is no mass.     Tenderness: There is no abdominal tenderness. There is no guarding or rebound.  Genitourinary:    Rectum:  Tenderness and external hemorrhoid present.  Neurological:     Mental Status: She is alert.     No results found for any visits on 12/22/22.      Assessment & Plan:   Problem List Items Addressed This Visit   None Visit Diagnoses     Hemorrhoids, unspecified hemorrhoid type    -  Primary Tender on exam, and mildly firm - uncertain if thrombosed, but given severe discomfort, recommend the following: [facility] [phone]) - possibly same day appointment - will place referral, but you can also call them Will go ahead and do GI referral as well at your request, but you can always cancel if we get it taken care of before you get in.  Adding cream and/or suppository you can use if needed in the meantime.   Avoid prolonged sitting.  Avoid straining with bowel movements.  Increase high fiber food intake.   Participate in regular exercise. Bulk forming agents: Metamucil, Citrucel, Fibercon Stool softeners: Colace Irritation/Itching relief: OTC Anusol, Preparation H, hydrocortisone suppositories, warm sitz baths    Relevant Medications   hydrocortisone (PROCTO-MED HC) 2.5 % rectal cream   hydrocortisone (ANUSOL-HC) 25 MG suppository   Other Relevant Orders   Ambulatory referral to General Surgery   Ambulatory referral to Gastroenterology       Meds ordered  this encounter  Medications   hydrocortisone (PROCTO-MED HC) 2.5 % rectal cream    Sig: Place 1 Application rectally 2 (two) times daily.    Dispense:  30 g    Refill:  0    Order Specific Question:   Supervising Provider    Answer:   Danise Edge A [[id number]]   hydrocortisone (ANUSOL-HC) 25 MG suppository    Sig: Place 1 suppository (25 mg total) rectally 2 (two) times daily.    Dispense:  12 suppository    Refill:  0    Order Specific Question:   Supervising Provider    Answer:   Danise Edge A [[id number]]    Return if symptoms worsen or fail to improve.  Clayborne Dana, NP

## 2022-12-22 NOTE — Patient Instructions (Addendum)
[facility] [phone]) - possibly same day appointment - will place referral, but you can also call them Will go ahead and do GI referral as well at your request, but you can always cancel if we get it taken care of before you get in.  Adding cream and/or suppository you can use if needed in the meantime.   Avoid prolonged sitting.  Avoid straining with bowel movements.  Increase high fiber food intake.   Participate in regular exercise. Bulk forming agents: Metamucil, Citrucel, Fibercon Stool softeners: Colace Irritation/Itching relief: OTC Anusol, Preparation H, hydrocortisone suppositories, warm sitz baths

## 2023-04-06 ENCOUNTER — Telehealth: Payer: Self-pay | Admitting: Family Medicine

## 2023-04-06 NOTE — Telephone Encounter (Signed)
Called to schedule VV

## 2023-04-06 NOTE — Telephone Encounter (Signed)
Pt said she lost hearing in left ear for last week and she wants to know if Dr. Carmelia Roller can resend the ENT referral for her. Advised she may need an appt but Pt is unable to take time off to come back to the office due to her work schedule. She was advised she may be able to do a video visit instead. Please advise if referral can be resent or if pt needs to have a virtual.

## 2023-04-07 ENCOUNTER — Telehealth (INDEPENDENT_AMBULATORY_CARE_PROVIDER_SITE_OTHER): Payer: BC Managed Care – PPO | Admitting: Family Medicine

## 2023-04-07 ENCOUNTER — Encounter: Payer: Self-pay | Admitting: Family Medicine

## 2023-04-07 DIAGNOSIS — H938X2 Other specified disorders of left ear: Secondary | ICD-10-CM | POA: Diagnosis not present

## 2023-04-07 MED ORDER — PREDNISONE 20 MG PO TABS: 40.0000 mg | ORAL_TABLET | Freq: Every day | ORAL | 0 refills | Status: AC

## 2023-04-07 MED ORDER — PREDNISONE 20 MG PO TABS: 40.0000 mg | ORAL_TABLET | Freq: Every day | ORAL | 0 refills | Status: DC

## 2023-04-07 NOTE — Progress Notes (Signed)
Chief Complaint  Patient presents with   Hearing Loss    Subjective: Patient is a 42 y.o. female here for hearing loss. We are interacting via web portal for an electronic face-to-face visit. I verified patient's ID using 2 identifiers. Patient agreed to proceed with visit via this method. Patient is at home, I am at office. Patient and I are present for visit.   L ear fullness/hearing loss over the past week. Hasn't gotten better. No fevers, pain, drainage. Has had water in ears previously. Feels like she is going in an airplane and ears need to pop. Does not use Q tips, historically does not get a lot of wax.   Past Medical History:  Diagnosis Date   Abnormal Pap smear 2006   leep   Anxiety    Arthritis    osteoarthritis   Asthma    Complication of anesthesia    2014 d+c [facility], HAD ??  SEIZURE/ SHAKING   Cough    Depression    Fatigue    GERD (gastroesophageal reflux disease)    Herpes    never had outbreak. pos per blood, doesn't know which type   Lactose intolerance    Lymphangiomatosis    Polycystic ovarian syndrome    Prediabetes    Sleep apnea    uses CPAP   SVD (spontaneous vaginal delivery)    x 1   Varicose vein of leg    Vitamin D deficiency     Objective: No conversational dyspnea Age appropriate judgment and insight Nml affect and mood  Assessment and Plan: Sensation of fullness in left ear - Plan: predniSONE (DELTASONE) 20 MG tablet  5-day prednisone burst 40 mg daily to cover for both eustachian tube dysfunction and middle ear effusion.  Less likely to be an infection based off of the history.  Hopefully it is not wax.  Follow-up for physical at her convenience. The patient voiced understanding and agreement to the plan.  Jilda Roche [place], DO 04/07/23  2:26 PM

## 2023-04-22 ENCOUNTER — Other Ambulatory Visit: Payer: Self-pay | Admitting: Family Medicine

## 2023-04-22 ENCOUNTER — Encounter: Payer: Self-pay | Admitting: Family Medicine

## 2023-04-22 DIAGNOSIS — H938X2 Other specified disorders of left ear: Secondary | ICD-10-CM

## 2023-04-22 DIAGNOSIS — N9089 Other specified noninflammatory disorders of vulva and perineum: Secondary | ICD-10-CM | POA: Diagnosis not present

## 2023-04-22 DIAGNOSIS — N879 Dysplasia of cervix uteri, unspecified: Secondary | ICD-10-CM | POA: Diagnosis not present

## 2023-04-22 DIAGNOSIS — Z124 Encounter for screening for malignant neoplasm of cervix: Secondary | ICD-10-CM | POA: Diagnosis not present

## 2023-05-11 DIAGNOSIS — L858 Other specified epidermal thickening: Secondary | ICD-10-CM | POA: Diagnosis not present

## 2023-05-11 DIAGNOSIS — N9089 Other specified noninflammatory disorders of vulva and perineum: Secondary | ICD-10-CM | POA: Diagnosis not present

## 2023-05-29 ENCOUNTER — Telehealth: Payer: Self-pay | Admitting: Emergency Medicine

## 2023-05-29 NOTE — Telephone Encounter (Signed)
Pt is in [place] at the moment & is in need of a rescue inhale LOV was 07/2020, informed patient she will need a appointment made due to how long its been since LOV

## 2023-06-08 ENCOUNTER — Encounter: Payer: Self-pay | Admitting: *Deleted

## 2023-06-08 NOTE — Telephone Encounter (Signed)
Mychart msg to pt to make appt and contact PCP for refills in the meantime

## 2023-07-28 ENCOUNTER — Encounter: Payer: BC Managed Care – PPO | Admitting: Family Medicine

## 2023-08-26 ENCOUNTER — Other Ambulatory Visit: Payer: Self-pay

## 2023-08-26 ENCOUNTER — Encounter: Payer: Self-pay | Admitting: Emergency Medicine

## 2023-08-26 ENCOUNTER — Ambulatory Visit: Payer: BC Managed Care – PPO | Admitting: Emergency Medicine

## 2023-08-26 VITALS — BP 134/80 | HR 71 | Ht 62.0 in | Wt 186.0 lb

## 2023-08-26 DIAGNOSIS — J8481 Lymphangioleiomyomatosis: Secondary | ICD-10-CM | POA: Diagnosis not present

## 2023-08-26 DIAGNOSIS — R918 Other nonspecific abnormal finding of lung field: Secondary | ICD-10-CM | POA: Diagnosis not present

## 2023-08-26 DIAGNOSIS — J4520 Mild intermittent asthma, uncomplicated: Secondary | ICD-10-CM

## 2023-08-26 LAB — CBC
HCT: 34.1 % — ABNORMAL LOW (ref 36.0–46.0)
Hemoglobin: 10.6 g/dL — ABNORMAL LOW (ref 12.0–15.0)
MCHC: 31.2 g/dL (ref 30.0–36.0)
MCV: 78.5 fL (ref 78.0–100.0)
Platelets: 384 10*3/uL (ref 150.0–400.0)
RBC: 4.35 Mil/uL (ref 3.87–5.11)
RDW: 15.8 % — ABNORMAL HIGH (ref 11.5–15.5)
WBC: 4.3 10*3/uL (ref 4.0–10.5)

## 2023-08-26 LAB — COMPREHENSIVE METABOLIC PANEL
ALT: 9 U/L (ref 0–35)
AST: 12 U/L (ref 0–37)
Albumin: 4.1 g/dL (ref 3.5–5.2)
Alkaline Phosphatase: 69 U/L (ref 39–117)
BUN: 11 mg/dL (ref 6–23)
CO2: 26 meq/L (ref 19–32)
Calcium: 9 mg/dL (ref 8.4–10.5)
Chloride: 107 meq/L (ref 96–112)
Creatinine, Ser: 0.66 mg/dL (ref 0.40–1.20)
GFR: 106.83 mL/min (ref >60.00)
Glucose, Bld: 96 mg/dL (ref 70–99)
Potassium: 3.4 meq/L — ABNORMAL LOW (ref 3.5–5.1)
Sodium: 140 meq/L (ref 135–145)
Total Bilirubin: 0.4 mg/dL (ref 0.2–1.2)
Total Protein: 7.1 g/dL (ref 6.0–8.3)

## 2023-08-26 MED ORDER — ALBUTEROL SULFATE HFA 108 (90 BASE) MCG/ACT IN AERS: 2.0000 | INHALATION_SPRAY | RESPIRATORY_TRACT | 0 refills | Status: AC | PRN

## 2023-08-26 NOTE — Progress Notes (Signed)
   Subjective:    Patient ID: Brenda Williams, female    DOB: 04-08-1981, 43 y.o.   MRN: [id number]  HPI  ROV 08/26/2023 --43 year old woman whom I have seen in the past for biopsy-proven LAM with associated polycystic lung disease and obstructive lung disease.  Also with a history of OSA not on CPAP (previously on 10 cmH2O).  I have managed her in the past on sirolimus but not currently - last was late 2023.  She returns to reestablish care. She reports that her breathing has been pretty good, stable compared with prior visits > she has exertional SOB w humidity, also with stairs. She was in the hospital last summer after inhaling some cleaning spray, had what sounds like an AE-asthma.  She experiences daily fatigue, she ran out of albuterol, not on maintenance BD She didn;t get flu shot this year.   Last CT scan of the chest 03/06/2022 >> no pulmonary embolism, diffuse cystic lung disease, overall stable, 9 mm solid right upper lobe nodule stable since 2018  Pulmonary function testing 01/18/2020 >> moderately severe obstruction, FEV1 1.87 L (66% predicted), no bronchodilator response, normal lung volumes, diffusion capacity not done   Review of Systems As per HPI     Objective:   Physical Exam Vitals:   08/26/23 0854  BP: 134/80  Pulse: 71  SpO2: 98%  Weight: 186 lb (84.4 kg)  Height: 5\' 2"  (1.575 m)   Body mass index is 34.02 kg/m.  Gen: Pleasant, obese woman, in no distress,  normal affect  ENT: No lesions,  mouth clear,  oropharynx clear, no postnasal drip  Neck: No JVD, no stridor  Lungs: No use of accessory muscles, distant, clear without rales or rhonchi  Cardiovascular: RRR, heart sounds normal, no murmur or gallops, no peripheral edema  Musculoskeletal: No deformities, no cyanosis or clubbing  Neuro: alert, non focal  Skin: Warm, no lesions or rashes      Assessment & Plan:    Lymphangioleiomyomatosis (HCC) Previously on sirolimus, has been lost to  follow-up.  She is overall clinically stable but I do believe she needs recheck CT chest, recheck PFT to assess for progression of disease.  Optimally she will agree to going back on sirolimus.  I will check CBC and CMP for baseline assuming that we Williams be able to restart.  I am glad that you are back for follow-up We will check pulmonary function testing at your next office visit We will perform a CT scan of your chest to compare with your priors. We will do lab work today We will refill your albuterol.  Use this 2 puffs up to every 4 hours if needed for shortness of breath, chest tightness, wheezing.  We Williams decide to start you on a scheduled inhaler in some point in the future Follow with Dr. Delton Coombes after your testing.  At that time we will talk about getting you back on every day medication for LAM, probably sirolimus.  Pulmonary nodules Benign on serial CTs  Mild intermittent asthma Obstruction noted.  She does have albuterol available to use if needed.  We will reassess her degree of obstruction with repeat PFT  Levy Pupa, MD, PhD 08/26/2023, 1:30 PM [facility] [phone] or if no answer [phone]

## 2023-08-26 NOTE — Assessment & Plan Note (Signed)
Benign on serial CTs

## 2023-08-26 NOTE — Assessment & Plan Note (Signed)
Obstruction noted.  She does have albuterol available to use if needed.  We will reassess her degree of obstruction with repeat PFT

## 2023-08-26 NOTE — Patient Instructions (Signed)
I am glad that you are back for follow-up We will check pulmonary function testing at your next office visit We will perform a CT scan of your chest to compare with your priors. We will do lab work today We will refill your albuterol.  Use this 2 puffs up to every 4 hours if needed for shortness of breath, chest tightness, wheezing.  We may decide to start you on a scheduled inhaler in some point in the future Follow with Dr. Delton Coombes after your testing.  At that time we will talk about getting you back on every day medication for LAM, probably sirolimus.

## 2023-08-26 NOTE — Assessment & Plan Note (Signed)
Previously on sirolimus, has been lost to follow-up.  She is overall clinically stable but I do believe she needs recheck CT chest, recheck PFT to assess for progression of disease.  Optimally she will agree to going back on sirolimus.  I will check CBC and CMP for baseline assuming that we may be able to restart.  I am glad that you are back for follow-up We will check pulmonary function testing at your next office visit We will perform a CT scan of your chest to compare with your priors. We will do lab work today We will refill your albuterol.  Use this 2 puffs up to every 4 hours if needed for shortness of breath, chest tightness, wheezing.  We may decide to start you on a scheduled inhaler in some point in the future Follow with Dr. Delton Coombes after your testing.  At that time we will talk about getting you back on every day medication for LAM, probably sirolimus.

## 2023-09-09 ENCOUNTER — Ambulatory Visit
Admission: RE | Admit: 2023-09-09 | Discharge: 2023-09-09 | Disposition: A | Discharge status: Home / Self Care | Payer: BC Managed Care – PPO | Source: Ambulatory Visit | Attending: Emergency Medicine | Admitting: Emergency Medicine

## 2023-09-09 DIAGNOSIS — J8481 Lymphangioleiomyomatosis: Secondary | ICD-10-CM

## 2023-09-09 DIAGNOSIS — R911 Solitary pulmonary nodule: Secondary | ICD-10-CM | POA: Diagnosis not present

## 2023-09-09 DIAGNOSIS — Z9884 Bariatric surgery status: Secondary | ICD-10-CM | POA: Diagnosis not present

## 2023-09-09 NOTE — Progress Notes (Signed)
 Follow up  cystic lung disease  Hx of lung biopsy

## 2023-09-10 ENCOUNTER — Ambulatory Visit (HOSPITAL_BASED_OUTPATIENT_CLINIC_OR_DEPARTMENT_OTHER): Payer: BC Managed Care – PPO | Admitting: Emergency Medicine

## 2023-09-10 ENCOUNTER — Ambulatory Visit: Payer: BC Managed Care – PPO | Admitting: Emergency Medicine

## 2023-09-10 ENCOUNTER — Encounter: Payer: Self-pay | Admitting: Emergency Medicine

## 2023-09-10 VITALS — BP 116/84 | HR 94 | Temp 97.9°F | Ht 67.5 in | Wt 183.6 lb

## 2023-09-10 DIAGNOSIS — J439 Emphysema, unspecified: Secondary | ICD-10-CM

## 2023-09-10 DIAGNOSIS — J8481 Lymphangioleiomyomatosis: Secondary | ICD-10-CM | POA: Diagnosis not present

## 2023-09-10 LAB — PULMONARY FUNCTION TEST
DL/VA % pred: 69 %
DL/VA: 2.96 ml/min/mmHg/L
DLCO cor % pred: 48 %
DLCO cor: 11.52 ml/min/mmHg
DLCO unc % pred: 43 %
DLCO unc: 10.39 ml/min/mmHg
FEF 25-75 Post: 1.02 L/s
FEF 25-75 Pre: 0.79 L/s
FEF2575-%Change-Post: 29 %
FEF2575-%Pred-Post: 32 %
FEF2575-%Pred-Pre: 24 %
FEV1-%Change-Post: 10 %
FEV1-%Pred-Post: 54 %
FEV1-%Pred-Pre: 49 %
FEV1-Post: 1.76 L
FEV1-Pre: 1.59 L
FEV1FVC-%Change-Post: 8 %
FEV1FVC-%Pred-Pre: 70 %
FEV6-%Change-Post: 4 %
FEV6-%Pred-Post: 71 %
FEV6-%Pred-Pre: 68 %
FEV6-Post: 2.81 L
FEV6-Pre: 2.69 L
FEV6FVC-%Change-Post: 1 %
FEV6FVC-%Pred-Post: 102 %
FEV6FVC-%Pred-Pre: 100 %
FVC-%Change-Post: 2 %
FVC-%Pred-Post: 70 %
FVC-%Pred-Pre: 68 %
FVC-Post: 2.81 L
FVC-Pre: 2.75 L
Post FEV1/FVC ratio: 62 %
Post FEV6/FVC ratio: 100 %
Pre FEV1/FVC ratio: 58 %
Pre FEV6/FVC Ratio: 98 %
RV % pred: 120 %
RV: 2.2 L
TLC % pred: 88 %
TLC: 4.86 L

## 2023-09-10 LAB — COMPREHENSIVE METABOLIC PANEL
ALT: 11 U/L (ref 0–35)
AST: 13 U/L (ref 0–37)
Albumin: 3.9 g/dL (ref 3.5–5.2)
Alkaline Phosphatase: 60 U/L (ref 39–117)
BUN: 11 mg/dL (ref 6–23)
CO2: 24 meq/L (ref 19–32)
Calcium: 8.9 mg/dL (ref 8.4–10.5)
Chloride: 107 meq/L (ref 96–112)
Creatinine, Ser: 0.77 mg/dL (ref 0.40–1.20)
GFR: 93.92 mL/min (ref >60.00)
Glucose, Bld: 129 mg/dL — ABNORMAL HIGH (ref 70–99)
Potassium: 3.4 meq/L — ABNORMAL LOW (ref 3.5–5.1)
Sodium: 138 meq/L (ref 135–145)
Total Bilirubin: 0.3 mg/dL (ref 0.2–1.2)
Total Protein: 7 g/dL (ref 6.0–8.3)

## 2023-09-10 LAB — CBC
HCT: 33.3 % — ABNORMAL LOW (ref 36.0–46.0)
Hemoglobin: 10.4 g/dL — ABNORMAL LOW (ref 12.0–15.0)
MCHC: 31.4 g/dL (ref 30.0–36.0)
MCV: 78 fL (ref 78.0–100.0)
Platelets: 370 10*3/uL (ref 150.0–400.0)
RBC: 4.26 Mil/uL (ref 3.87–5.11)
RDW: 15.6 % — ABNORMAL HIGH (ref 11.5–15.5)
WBC: 5.4 10*3/uL (ref 4.0–10.5)

## 2023-09-10 MED ORDER — ALBUTEROL SULFATE HFA 108 (90 BASE) MCG/ACT IN AERS: 2.0000 | INHALATION_SPRAY | Freq: Four times a day (QID) | RESPIRATORY_TRACT | 6 refills | Status: AC | PRN

## 2023-09-10 MED ORDER — SIROLIMUS 2 MG PO TABS
2.0000 mg | ORAL_TABLET | Freq: Every day | ORAL | 5 refills | Status: DC
Start: 2023-09-10 — End: 2024-03-21

## 2023-09-10 NOTE — Progress Notes (Signed)
 Full PFT Performed Today

## 2023-09-10 NOTE — Assessment & Plan Note (Signed)
 Significant cystic disease with decrease in her pulmonary function compared with 3 years ago.  Her CT shows extensive disease.  I did discuss with her the possibility that she will need supplemental oxygen.  We will perform a walking oximetry today on room air.  Also did briefly bring up the possibility that she may need to be referred for transplant at some point going forward depending on progression of her disease.  I we will start her on sirolimus 2 mg once daily.  Check her trough lab work 2 weeks after she initiates.  Also check her CBC and CMP at that time.  Goal trough level would be 5-10 ng/mL.  She needs to follow-up with either RB or APP at that time to plan continuing the medication and lab surveillance.

## 2023-09-10 NOTE — Patient Instructions (Signed)
 Full PFT Performed Today

## 2023-09-10 NOTE — Progress Notes (Signed)
 Subjective:    Patient ID: Brenda Williams, female    DOB: 12/27/1980, 43 y.o.   MRN: [id number]  HPI  ROV 08/26/2023 --43 year old woman whom I have seen in the past for biopsy-proven LAM with associated polycystic lung disease and obstructive lung disease.  Also with a history of OSA not on CPAP (previously on 10 cmH2O).  I have managed her in the past on sirolimus but not currently - last was late 2023.  She returns to reestablish care. She reports that her breathing has been pretty good, stable compared with prior visits > she has exertional SOB w humidity, also with stairs. She was in the hospital last summer after inhaling some cleaning spray, had what sounds like an AE-asthma.  She experiences daily fatigue, she ran out of albuterol, not on maintenance BD She didn;t get flu shot this year.   Last CT scan of the chest 03/06/2022 >> no pulmonary embolism, diffuse cystic lung disease, overall stable, 9 mm solid right upper lobe nodule stable since 2018  Pulmonary function testing 01/18/2020 >> moderately severe obstruction, FEV1 1.87 L (66% predicted), no bronchodilator response, normal lung volumes, diffusion capacity not done  ROV 09/10/2023 --Brenda Williams is 43 with a history of biopsy-proven LAM and associated polycystic lung disease.  She has obstruction consistent with asthma, history of OSA not on CPAP.  She was lost to follow-up for a period of time and has been off medication (sirolimus).  She reestablished care earlier this month.  She has undergone repeat pulmonary function testing and CT scan of the chest.      Latest Ref Rng & Units 08/26/2023    9:33 AM 10/09/2022    1:19 PM 03/07/2022    1:01 PM  CBC  WBC 4.0 - 10.5 K/uL 4.3  7.3  6.3   Hemoglobin 12.0 - 15.0 g/dL 78.2  95.6  21.3   Hematocrit 36.0 - 46.0 % 34.1  35.9  36.9   Platelets 150.0 - 400.0 K/uL 384.0  383  386        Latest Ref Rng & Units 08/26/2023    9:33 AM 10/09/2022    1:19 PM 03/08/2022    7:20 AM  CMP   Glucose 70 - 99 mg/dL 96  46  086   BUN 6 - 23 mg/dL 11  10  13    Creatinine 0.40 - 1.20 mg/dL 5.78  4.69  6.29   Sodium 135 - 145 mEq/L 140  136  142   Potassium 3.5 - 5.1 mEq/L 3.4  3.2  3.6   Chloride 96 - 112 mEq/L 107  105  111   CO2 19 - 32 mEq/L 26  23  25    Calcium 8.4 - 10.5 mg/dL 9.0  9.1  9.1   Total Protein 6.0 - 8.3 g/dL 7.1  7.7  6.8   Total Bilirubin 0.2 - 1.2 mg/dL 0.4  0.4  0.8   Alkaline Phos 39 - 117 U/L 69  62  54   AST 0 - 37 U/L 12  17  9    ALT 0 - 35 U/L 9  16  11      CT scan of the chest done 09/09/2023 reviewed by me shows widespread thin-walled microcystic changes similar to priors, probably more predominant in the upper lobes  Pulmonary function testing performed today and reviewed by me shows severe obstruction with an FEV1 of 1.59 L (49% predicted).  This is down significantly from 3 years ago at which  time her FEV1 was 1.87 L.  She has a borderline bronchodilator response.  Her total lung capacity is stable.  Lung volumes were overall normal.  Her diffusion capacity is decreased and does not correct to the normal range when adjusted for alveolar volume (a change compared with 3 years prior)  2mg /day Labs in 2 weeks (trough) > 5-10 ng/ml   Review of Systems As per HPI     Objective:   Physical Exam Vitals:   09/10/23 1342  BP: 116/84  Pulse: 94  Temp: 97.9 F (36.6 C)  TempSrc: Temporal  SpO2: 95%  Weight: 183 lb 9.6 oz (83.3 kg)  Height: 5' 7.5" (1.715 m)   Body mass index is 28.33 kg/m.  Gen: Pleasant, obese woman, in no distress,  normal affect  ENT: No lesions,  mouth clear,  oropharynx clear, no postnasal drip  Neck: No JVD, no stridor  Lungs: No use of accessory muscles, distant, clear without rales or rhonchi  Cardiovascular: RRR, heart sounds normal, no murmur or gallops, no peripheral edema  Musculoskeletal: No deformities, no cyanosis or clubbing  Neuro: alert, non focal  Skin: Warm, no lesions or rash    Assessment &  Plan:    Lymphangioleiomyomatosis (HCC) Significant cystic disease with decrease in her pulmonary function compared with 3 years ago.  Her CT shows extensive disease.  I did discuss with her the possibility that she will need supplemental oxygen.  We will perform a walking oximetry today on room air.  Also did briefly bring up the possibility that she Williams need to be referred for transplant at some point going forward depending on progression of her disease.  I we will start her on sirolimus 2 mg once daily.  Check her trough lab work 2 weeks after she initiates.  Also check her CBC and CMP at that time.  Goal trough level would be 5-10 ng/mL.  She needs to follow-up with either RB or APP at that time to plan continuing the medication and lab surveillance.  Pulmonary emphysema, unspecified emphysema type (HCC) Asthmatic COPD associated with LAM.  Need to consider putting her on BD therapy.  At her next visit I think we should start either Stiolto or possibly an ICS/LAMA.   Levy Pupa, MD, PhD 09/10/2023, 2:14 PM [facility] [phone] or if no answer [phone]

## 2023-09-10 NOTE — Addendum Note (Signed)
 Addended by: Aundra Dubin on: 09/10/2023 02:17 PM   Modules accepted: Orders

## 2023-09-10 NOTE — Assessment & Plan Note (Signed)
 Asthmatic COPD associated with LAM.  Need to consider putting her on BD therapy.  At her next visit I think we should start either Stiolto or possibly an ICS/LAMA.

## 2023-09-10 NOTE — Patient Instructions (Signed)
 We reviewed your pulmonary function testing and your CT scan of the chest today. We will restart sirolimus, 2 mg once daily. We need to check your lab work 2 weeks after you start the medication. We will perform a walking oximetry test today Follow in our office with either Dr. Delton Coombes or APP in 3 weeks.

## 2023-09-14 ENCOUNTER — Encounter: Payer: Self-pay | Admitting: Emergency Medicine

## 2023-09-14 LAB — SIROLIMUS LEVEL: Sirolimus (Rapamycin): <1 ug/L — ABNORMAL LOW (ref 3.0–18.0)

## 2023-10-01 ENCOUNTER — Encounter: Payer: Self-pay | Admitting: Emergency Medicine

## 2023-10-01 ENCOUNTER — Ambulatory Visit: Payer: BC Managed Care – PPO | Admitting: Emergency Medicine

## 2023-10-01 VITALS — BP 136/84 | HR 75 | Ht 67.0 in | Wt 188.2 lb

## 2023-10-01 DIAGNOSIS — J439 Emphysema, unspecified: Secondary | ICD-10-CM | POA: Diagnosis not present

## 2023-10-01 DIAGNOSIS — J9601 Acute respiratory failure with hypoxia: Secondary | ICD-10-CM | POA: Diagnosis not present

## 2023-10-01 DIAGNOSIS — J8481 Lymphangioleiomyomatosis: Secondary | ICD-10-CM

## 2023-10-01 NOTE — Patient Instructions (Signed)
 We will arrange for you to have portable oxygen to use with exertion.  We will try to obtain a portable oxygen concentrator. Continue your sirolimus 2 mg daily as you have been taking it. We will check lab work.  Orders placed today.  Please come back to our office lab to have these done. Please start Stiolto 2 puffs once daily.  Take it every day on a schedule.  Keep track of how it helps your breathing so we can discuss next time. Follow Dr. Delton Coombes in about 6 weeks so we can review your status and recheck lab work.

## 2023-10-01 NOTE — Progress Notes (Signed)
 Subjective:    Patient ID: Brenda Williams, female    DOB: Mar 16, 1981, 43 y.o.   MRN: [id number]  HPI  ROV 09/10/2023 --[name] is 23 with a history of biopsy-proven LAM and associated polycystic lung disease.  She has obstruction consistent with asthma, history of OSA not on CPAP.  She was lost to follow-up for a period of time and has been off medication (sirolimus).  She reestablished care earlier this month.  She has undergone repeat pulmonary function testing and CT scan of the chest.      Latest Ref Rng & Units 09/10/2023    2:26 PM 08/26/2023    9:33 AM 10/09/2022    1:19 PM  CBC  WBC 4.0 - 10.5 K/uL 5.4  4.3  7.3   Hemoglobin 12.0 - 15.0 g/dL 11.9  14.7  82.9   Hematocrit 36.0 - 46.0 % 33.3  34.1  35.9   Platelets 150.0 - 400.0 K/uL 370.0  384.0  383        Latest Ref Rng & Units 09/10/2023    2:26 PM 08/26/2023    9:33 AM 10/09/2022    1:19 PM  CMP  Glucose 70 - 99 mg/dL 562  96  46   BUN 6 - 23 mg/dL 11  11  10    Creatinine 0.40 - 1.20 mg/dL 1.30  8.65  7.84   Sodium 135 - 145 mEq/L 138  140  136   Potassium 3.5 - 5.1 mEq/L 3.4  3.4  3.2   Chloride 96 - 112 mEq/L 107  107  105   CO2 19 - 32 mEq/L 24  26  23    Calcium 8.4 - 10.5 mg/dL 8.9  9.0  9.1   Total Protein 6.0 - 8.3 g/dL 7.0  7.1  7.7   Total Bilirubin 0.2 - 1.2 mg/dL 0.3  0.4  0.4   Alkaline Phos 39 - 117 U/L 60  69  62   AST 0 - 37 U/L 13  12  17    ALT 0 - 35 U/L 11  9  16      CT scan of the chest done 09/09/2023 reviewed by me shows widespread thin-walled microcystic changes similar to priors, probably more predominant in the upper lobes  Pulmonary function testing performed today and reviewed by me shows severe obstruction with an FEV1 of 1.59 L (49% predicted).  This is down significantly from 3 years ago at which time her FEV1 was 1.87 L.  She has a borderline bronchodilator response.  Her total lung capacity is stable.  Lung volumes were overall normal.  Her diffusion capacity is decreased and does not  correct to the normal range when adjusted for alveolar volume (a change compared with 3 years prior)   ROV 10/01/2023 --follow-up visit for [name].  She is 49 with a history of LAM, OSA, asthma.  She has significant cystic disease and obstructive lung disease.  We performed a walking oximetry 3 weeks ago when I saw her and she did desaturate. We started her back on sirolimus at that visit, 2 mg daily.  Her CBC and CMP were reassuring with the exception of some hypokalemia and hemoglobin 10.4. She is not currently on any BD therapy She is tolerating the sirolimus, does sometimes have nausea. It did delay her menstrual cycle slightly, caused some acne. She has seen some desats at home w exertion. She will get SOB w exertion, with singing, etc.    Review of Systems  As per HPI     Objective:   Physical Exam Vitals:   10/01/23 1307  BP: 136/84  Pulse: 75  SpO2: 99%  Weight: 188 lb 3.2 oz (85.4 kg)  Height: 5\' 7"  (1.702 m)    Body mass index is 29.48 kg/m.  Gen: Pleasant, obese woman, in no distress,  normal affect  ENT: No lesions,  mouth clear,  oropharynx clear, no postnasal drip  Neck: No JVD, no stridor  Lungs: No use of accessory muscles, distant, clear without rales or rhonchi  Cardiovascular: RRR, heart sounds normal, no murmur or gallops, no peripheral edema  Musculoskeletal: No deformities, no cyanosis or clubbing  Neuro: alert, non focal  Skin: Warm, no lesions or rash    Assessment & Plan:    Lymphangioleiomyomatosis (HCC) Started on sirolimus 2 mg daily.  Her baseline lab work was okay, needs a sirolimus level and recheck CBC and BMP.  Will order this today and she will come back to have it done here.  Pulmonary emphysema, unspecified emphysema type (HCC) We will start Stiolto today to see if she gets clinical benefit.  Follow-up in about 6 weeks to determine how she is doing.  Acute respiratory failure with hypoxia (HCC) Documented on ambulation last  time.  We will titrate her for POC and order oxygen for her.    Levy Pupa, MD, PhD 10/01/2023, 5:09 PM [facility] [phone] or if no answer [phone]

## 2023-10-01 NOTE — Assessment & Plan Note (Signed)
 Started on sirolimus 2 mg daily.  Her baseline lab work was okay, needs a sirolimus level and recheck CBC and BMP.  Will order this today and she will come back to have it done here.

## 2023-10-01 NOTE — Assessment & Plan Note (Signed)
 We will start Stiolto today to see if she gets clinical benefit.  Follow-up in about 6 weeks to determine how she is doing.

## 2023-10-01 NOTE — Assessment & Plan Note (Signed)
 Documented on ambulation last time.  We will titrate her for POC and order oxygen for her.

## 2023-10-02 ENCOUNTER — Telehealth: Payer: Self-pay | Admitting: Emergency Medicine

## 2023-10-02 NOTE — Telephone Encounter (Signed)
 Per Kennyth Arnold at [facility],  We do not provide just POC, we will need the order updated to include the stationary and the liter flow on the stationary.  Please advise.  Thank You

## 2023-10-02 NOTE — Telephone Encounter (Signed)
 Order has been updated.

## 2023-10-02 NOTE — Addendum Note (Signed)
 Addended byFloydene Flock, Madyson Lukach A on: 10/02/2023 11:04 AM   Modules accepted: Orders

## 2023-10-05 DIAGNOSIS — J9601 Acute respiratory failure with hypoxia: Secondary | ICD-10-CM | POA: Diagnosis not present

## 2023-10-05 DIAGNOSIS — J8481 Lymphangioleiomyomatosis: Secondary | ICD-10-CM | POA: Diagnosis not present

## 2023-10-13 ENCOUNTER — Telehealth: Payer: Self-pay | Admitting: Emergency Medicine

## 2023-10-13 DIAGNOSIS — J8481 Lymphangioleiomyomatosis: Secondary | ICD-10-CM | POA: Diagnosis not present

## 2023-10-13 LAB — COMPREHENSIVE METABOLIC PANEL WITH GFR
ALT: 14 U/L (ref 0–35)
AST: 16 U/L (ref 0–37)
Albumin: 4.1 g/dL (ref 3.5–5.2)
Alkaline Phosphatase: 69 U/L (ref 39–117)
BUN: 9 mg/dL (ref 6–23)
CO2: 25 meq/L (ref 19–32)
Calcium: 9.3 mg/dL (ref 8.4–10.5)
Chloride: 106 meq/L (ref 96–112)
Creatinine, Ser: 0.62 mg/dL (ref 0.40–1.20)
GFR: 108.36 mL/min (ref >60.00)
Glucose, Bld: 91 mg/dL (ref 70–99)
Potassium: 3.2 meq/L — ABNORMAL LOW (ref 3.5–5.1)
Sodium: 140 meq/L (ref 135–145)
Total Bilirubin: 0.4 mg/dL (ref 0.2–1.2)
Total Protein: 7.2 g/dL (ref 6.0–8.3)

## 2023-10-13 LAB — CBC
HCT: 36.6 % (ref 36.0–46.0)
Hemoglobin: 11.6 g/dL — ABNORMAL LOW (ref 12.0–15.0)
MCHC: 31.6 g/dL (ref 30.0–36.0)
MCV: 78.3 fl (ref 78.0–100.0)
Platelets: 313 10*3/uL (ref 150.0–400.0)
RBC: 4.67 Mil/uL (ref 3.87–5.11)
RDW: 17.4 % — ABNORMAL HIGH (ref 11.5–15.5)
WBC: 3.9 10*3/uL — ABNORMAL LOW (ref 4.0–10.5)

## 2023-10-13 MED ORDER — STIOLTO RESPIMAT 2.5-2.5 MCG/ACT IN AERS: 2.0000 | INHALATION_SPRAY | Freq: Every day | RESPIRATORY_TRACT | 3 refills | Status: DC

## 2023-10-13 NOTE — Telephone Encounter (Addendum)
 Spoke with Loni Dolly. Rx stiolto has been sent to pharmacy. Pt is aware and verbalized understanding. FYI Dr. Delton Coombes sent rx to pharm as stated in lov. NFN.

## 2023-10-13 NOTE — Telephone Encounter (Signed)
 Thank you  Loks like she had her labs done today, will follow

## 2023-10-13 NOTE — Telephone Encounter (Signed)
 Last AVS states the following:  Please start Stiolto 2 puffs once daily.  Take it every day on a schedule.  Keep track of how it helps your breathing so we can discuss next time.  Nothing called in Pt said.  Pharm: [facility] on [address] in [place].

## 2023-10-15 LAB — SIROLIMUS LEVEL: Sirolimus (Rapamycin): 2 ug/L — ABNORMAL LOW (ref 3.0–18.0)

## 2023-10-25 ENCOUNTER — Other Ambulatory Visit: Payer: Self-pay

## 2023-10-25 ENCOUNTER — Emergency Department (HOSPITAL_BASED_OUTPATIENT_CLINIC_OR_DEPARTMENT_OTHER)

## 2023-10-25 ENCOUNTER — Encounter (HOSPITAL_BASED_OUTPATIENT_CLINIC_OR_DEPARTMENT_OTHER): Payer: Self-pay | Admitting: Emergency Medicine

## 2023-10-25 ENCOUNTER — Emergency Department (HOSPITAL_BASED_OUTPATIENT_CLINIC_OR_DEPARTMENT_OTHER)
Admission: EM | Admit: 2023-10-25 | Discharge: 2023-10-25 | Disposition: A | Discharge status: Home / Self Care | Attending: Emergency Medicine | Admitting: Emergency Medicine

## 2023-10-25 DIAGNOSIS — Z7951 Long term (current) use of inhaled steroids: Secondary | ICD-10-CM | POA: Diagnosis not present

## 2023-10-25 DIAGNOSIS — N83202 Unspecified ovarian cyst, left side: Secondary | ICD-10-CM | POA: Diagnosis not present

## 2023-10-25 DIAGNOSIS — R1013 Epigastric pain: Secondary | ICD-10-CM | POA: Diagnosis not present

## 2023-10-25 DIAGNOSIS — M5137 Other intervertebral disc degeneration, lumbosacral region with discogenic back pain only: Secondary | ICD-10-CM | POA: Diagnosis not present

## 2023-10-25 DIAGNOSIS — E876 Hypokalemia: Secondary | ICD-10-CM | POA: Insufficient documentation

## 2023-10-25 DIAGNOSIS — R9431 Abnormal electrocardiogram [ECG] [EKG]: Secondary | ICD-10-CM | POA: Diagnosis not present

## 2023-10-25 DIAGNOSIS — R110 Nausea: Secondary | ICD-10-CM | POA: Insufficient documentation

## 2023-10-25 DIAGNOSIS — M545 Low back pain, unspecified: Secondary | ICD-10-CM | POA: Insufficient documentation

## 2023-10-25 DIAGNOSIS — E119 Type 2 diabetes mellitus without complications: Secondary | ICD-10-CM | POA: Diagnosis not present

## 2023-10-25 DIAGNOSIS — J45909 Unspecified asthma, uncomplicated: Secondary | ICD-10-CM | POA: Diagnosis not present

## 2023-10-25 DIAGNOSIS — S39012A Strain of muscle, fascia and tendon of lower back, initial encounter: Secondary | ICD-10-CM | POA: Diagnosis not present

## 2023-10-25 DIAGNOSIS — J984 Other disorders of lung: Secondary | ICD-10-CM | POA: Diagnosis not present

## 2023-10-25 DIAGNOSIS — R109 Unspecified abdominal pain: Secondary | ICD-10-CM

## 2023-10-25 LAB — CBC WITH DIFFERENTIAL/PLATELET
Abs Immature Granulocytes: 0.02 10*3/uL (ref 0.00–0.07)
Basophils Absolute: 0 10*3/uL (ref 0.0–0.1)
Basophils Relative: 1 %
Eosinophils Absolute: 0 10*3/uL (ref 0.0–0.5)
Eosinophils Relative: 0 %
HCT: 35.3 % — ABNORMAL LOW (ref 36.0–46.0)
Hemoglobin: 11.4 g/dL — ABNORMAL LOW (ref 12.0–15.0)
Immature Granulocytes: 0 %
Lymphocytes Relative: 16 %
Lymphs Abs: 1.3 10*3/uL (ref 0.7–4.0)
MCH: 24.9 pg — ABNORMAL LOW (ref 26.0–34.0)
MCHC: 32.3 g/dL (ref 30.0–36.0)
MCV: 77.1 fL — ABNORMAL LOW (ref 80.0–100.0)
Monocytes Absolute: 0.4 10*3/uL (ref 0.1–1.0)
Monocytes Relative: 5 %
Neutro Abs: 6.1 10*3/uL (ref 1.7–7.7)
Neutrophils Relative %: 78 %
Platelets: 353 10*3/uL (ref 150–400)
RBC: 4.58 MIL/uL (ref 3.87–5.11)
RDW: 16.7 % — ABNORMAL HIGH (ref 11.5–15.5)
WBC: 7.8 10*3/uL (ref 4.0–10.5)
nRBC: 0 % (ref 0.0–0.2)

## 2023-10-25 LAB — COMPREHENSIVE METABOLIC PANEL WITH GFR
ALT: 14 U/L (ref 0–44)
AST: 13 U/L — ABNORMAL LOW (ref 15–41)
Albumin: 3.9 g/dL (ref 3.5–5.0)
Alkaline Phosphatase: 66 U/L (ref 38–126)
Anion gap: 8 (ref 5–15)
BUN: 11 mg/dL (ref 6–20)
CO2: 22 mmol/L (ref 22–32)
Calcium: 9 mg/dL (ref 8.9–10.3)
Chloride: 107 mmol/L (ref 98–111)
Creatinine, Ser: 0.76 mg/dL (ref 0.44–1.00)
GFR, Estimated: >60 mL/min (ref >60)
Glucose, Bld: 97 mg/dL (ref 70–99)
Potassium: 3.3 mmol/L — ABNORMAL LOW (ref 3.5–5.1)
Sodium: 137 mmol/L (ref 135–145)
Total Bilirubin: 0.7 mg/dL (ref 0.0–1.2)
Total Protein: 7.4 g/dL (ref 6.5–8.1)

## 2023-10-25 LAB — LIPASE, BLOOD: Lipase: 22 U/L (ref 11–51)

## 2023-10-25 LAB — HCG, SERUM, QUALITATIVE: Preg, Serum: NEGATIVE

## 2023-10-25 LAB — TROPONIN I (HIGH SENSITIVITY): Troponin I (High Sensitivity): 4 ng/L (ref <18)

## 2023-10-25 MED ORDER — SUCRALFATE 1 G PO TABS: 1.0000 g | ORAL_TABLET | Freq: Two times a day (BID) | ORAL | 0 refills | Status: DC

## 2023-10-25 MED ORDER — ONDANSETRON HCL 4 MG/2ML IJ SOLN
4.0000 mg | Freq: Once | INTRAMUSCULAR | Status: AC
  Administered 2023-10-25: 4 mg via INTRAVENOUS
  Filled 2023-10-25: qty 2

## 2023-10-25 MED ORDER — CYCLOBENZAPRINE HCL 10 MG PO TABS: 10.0000 mg | ORAL_TABLET | Freq: Two times a day (BID) | ORAL | 0 refills | Status: DC | PRN

## 2023-10-25 MED ORDER — MORPHINE SULFATE (PF) 4 MG/ML IV SOLN
4.0000 mg | Freq: Once | INTRAVENOUS | Status: AC
  Administered 2023-10-25: 4 mg via INTRAVENOUS
  Filled 2023-10-25: qty 1

## 2023-10-25 MED ORDER — ALUM & MAG HYDROXIDE-SIMETH 200-200-20 MG/5ML PO SUSP
30.0000 mL | Freq: Once | ORAL | Status: AC
  Administered 2023-10-25: 30 mL via ORAL
  Filled 2023-10-25: qty 30

## 2023-10-25 MED ORDER — POTASSIUM CHLORIDE CRYS ER 20 MEQ PO TBCR
40.0000 meq | EXTENDED_RELEASE_TABLET | Freq: Once | ORAL | Status: AC
  Administered 2023-10-25: 40 meq via ORAL
  Filled 2023-10-25: qty 2

## 2023-10-25 MED ORDER — IOHEXOL 300 MG/ML  SOLN
100.0000 mL | Freq: Once | INTRAMUSCULAR | Status: AC | PRN
  Administered 2023-10-25: 100 mL via INTRAVENOUS

## 2023-10-25 NOTE — ED Triage Notes (Addendum)
 Lumbar back pain and epigastric pain. Back pain "for some weeks" but worsening, epigastric pain that started yesterday evening. Denies n/v/d or constipation. Pt does report hx of gastric bypass. Taking Tylenol at home without relief. She states some hx of reflux type issues so she took her Pantoprazole "a couple hours ago". Pt states she is unable to sleep due to pain. Denies injury or distal neurological sx. She does report 1 episode of urinary incontinence but states she just couldn't get to the bathroom in time.

## 2023-10-25 NOTE — Discharge Instructions (Addendum)
 I have prescribed Amoxil Iraksin called Flexeril.  Take as needed.  This medication is sedating.  I have also prescribed Carafate to go along with your proton pump inhibitor for reflux.  Follow-up with your primary care doctor.  Return if symptoms worsen.

## 2023-10-25 NOTE — ED Provider Notes (Signed)
 Per my review interpretation of labs there is no significant anemia electrolyte abnormality kidney injury leukocytosis.  Troponin normal.  CT scans unremarkable per radiology report.  No acute findings.  Will add Carafate to help with reflux symptoms.  Will prescribe muscle relaxant for back spasms.  No concern for other emergent process.  Discharge.  This chart was dictated using voice recognition software.  Despite best efforts to proofread,  errors can occur which can change the documentation meaning.    Lowery Rue, DO 10/25/23 [phone]

## 2023-10-25 NOTE — ED Provider Notes (Signed)
  EMERGENCY DEPARTMENT AT [facility] Provider Note  CSN: [id number] Arrival date & time: 10/25/23 9562  Chief Complaint(s) Abdominal Pain and Back Pain  HPI Brenda Williams is a 43 y.o. female with past medical history as below, significant for anxiety, asthma, depression, GERD, PCOS, OSA on CPAP who presents to the ED with complaint of abdominal pain, low back pain.  Epigastric pain since last night, burning, cramping sensation, took Protonix without improvement.  She has nausea no vomiting.  No suspicious p.o. intake, no BRBPR or melena, no hematemesis.  No daily NSAID use, no daily alcohol use.  Does not follow gastroenterology.  She is also having low back pain over the past month, Pain is cramping, tightness sensation to her low back, nonradiating. no falls or injuries, no numbness or weakness to extremities.   No fevers, no prior spinal surgeries or recent spinal injections.  No IVDU. No cp  Past Medical History Past Medical History:  Diagnosis Date   Abnormal Pap smear 2006   leep   Anxiety    Arthritis    osteoarthritis   Asthma    Complication of anesthesia    2014 d+c [facility], HAD ??  SEIZURE/ SHAKING   Cough    Depression    Fatigue    GERD (gastroesophageal reflux disease)    Herpes    never had outbreak. pos per blood, doesn't know which type   Lactose intolerance    Lymphangiomatosis    Polycystic ovarian syndrome    Prediabetes    Sleep apnea    uses CPAP   SVD (spontaneous vaginal delivery)    x 1   Varicose vein of leg    Vitamin D deficiency    Patient Active Problem List   Diagnosis Date Noted   Acute respiratory failure with hypoxia (HCC) 03/06/2022   Peptic ulcer 08/07/2021   GERD (gastroesophageal reflux disease) 06/24/2021   High cholesterol 06/24/2021   Diabetes mellitus in remission (HCC) 06/24/2021   Pulmonary emphysema, unspecified emphysema type (HCC) 12/07/2020   Dyspnea on exertion 08/03/2020   Asthma  06/22/2020   Acute urinary tract infection 06/22/2020   Migraine 06/22/2020   Obstructive sleep apnea 03/08/2020   Depression 03/17/2017   Vitamin D deficiency 03/17/2017   Class 2 obesity with serious comorbidity and body mass index (BMI) of 35.0 to 35.9 in adult 03/17/2017   Prediabetes 02/23/2017   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 Authorizing Provider  albuterol (VENTOLIN HFA) 108 (90 Base) MCG/ACT inhaler Inhale 2 puffs into the lungs every 4 (four) hours as needed for wheezing or shortness of breath. 08/26/23   Byrum, Robert S, MD  albuterol (VENTOLIN HFA) 108 (90 Base) MCG/ACT inhaler Inhale 2 puffs into the lungs every 6 (six) hours as needed for wheezing or shortness of breath. 09/10/23   Denson Flake, MD  hydrocortisone (ANUSOL-HC) 25 MG suppository Place 1 suppository (25 mg total) rectally 2 (two) times daily. Patient not taking: Reported on 10/01/2023 12/22/22   Minna Amass B, NP  hydrocortisone (PROCTO-MED HC) 2.5 % rectal cream Place 1 Application rectally 2 (two) times daily. Patient not taking: Reported on 10/01/2023 12/22/22   Minna Amass B, NP  melatonin 1 MG TABS tablet Take  2 mg by mouth at bedtime as needed (sleep).    [provider]  Multiple Vitamins-Minerals (MULTIVITAMIN WITH MINERALS) tablet Take 1 tablet by mouth daily.    [provider]  sirolimus (RAPAMUNE) 2 MG tablet Take 1 tablet (2 mg total) by mouth daily. 09/10/23   Byrum, Robert S, MD  Tiotropium Bromide-Olodaterol (STIOLTO RESPIMAT) 2.5-2.5 MCG/ACT AERS Inhale 2 puffs into the lungs daily. 10/13/23   Denson Flake, MD                                                                                                                                     Past Surgical History Past Surgical History:  Procedure Laterality Date   DILATION AND EVACUATION N/A 09/14/2012   Procedure: DILATATION AND EVACUATION;  Surgeon: Jeanmarie Millet, MD;  Location: [facility];  Service: Gynecology;  Laterality: N/A;   HYSTEROSCOPY  2009   uterine polyp   IVF Retrival  2010, 2011   KNEE SURGERY  1998   right knee   LEEP  2006   LIPOSUCTION  2021   LUNG BIOPSY Right 12/28/2015   Procedure: RIGHT LUNG BIOPSY;  Surgeon: Heriberto London, MD;  Location: [facility] OR;  Service: Thoracic;  Laterality: Right;   ROUX-EN-Y GASTRIC BYPASS  06/2021   SPHINCTEROTOMY  2010   tummy tuck  11/12/2019   uterine polyp removal     VIDEO ASSISTED THORACOSCOPY Right 12/28/2015   Procedure: RIGHT VIDEO ASSISTED THORACOSCOPY;  Surgeon: Heriberto London, MD;  Location: [facility] OR;  Service: Thoracic;  Laterality: Right;   Family History Family History  Problem Relation Age of Onset   Diabetes Maternal Grandmother    Heart disease Maternal Grandmother    Hypertension Maternal Grandfather    Cancer Maternal Grandfather 58       bone, colon    Diabetes Maternal Grandfather    Colon cancer Maternal Grandfather    Diabetes Mother    Cancer Mother 68       breast cancer   Hyperlipidemia Mother    Depression Mother    Obesity Mother    Hypertension Father    Hyperlipidemia Father    Thyroid disease Father    Alcohol abuse Father    Drug abuse Father    Obesity Father    Anesthesia problems Neg Hx    Colon polyps Neg Hx    Esophageal cancer Neg Hx    Stomach cancer Neg Hx     Social History Social History   Tobacco Use   Smoking status: Never   Smokeless tobacco: Never  Vaping Use   Vaping status: Never Used  Substance Use Topics   Alcohol use: Not Currently    Alcohol/week: 0.0 standard drinks of alcohol   Drug use: No   Allergies Fentanyl and Hydromorphone  Review of Systems A thorough review of systems was obtained and all  systems are negative except  as noted in the HPI and PMH.   Physical Exam Vital Signs  I have reviewed the triage vital signs BP (!) 132/100 (BP Location: Right Arm)   Pulse 85   Temp 99.3 F (37.4 C) (Oral)   Resp 20   Ht 5\' 7"  (1.702 m)   Wt 82.6 kg   LMP 09/27/2023   SpO2 98%   BMI 28.51 kg/m  Physical Exam Vitals and nursing note reviewed.  Constitutional:      General: She is not in acute distress.    Appearance: Normal appearance.  HENT:     Head: Normocephalic and atraumatic.     Right Ear: External ear normal.     Left Ear: External ear normal.     Nose: Nose normal.     Mouth/Throat:     Mouth: Mucous membranes are moist.  Eyes:     General: No scleral icterus.       Right eye: No discharge.        Left eye: No discharge.  Cardiovascular:     Rate and Rhythm: Normal rate and regular rhythm.     Pulses: Normal pulses.     Heart sounds: Normal heart sounds.  Pulmonary:     Effort: Pulmonary effort is normal. No respiratory distress.     Breath sounds: Normal breath sounds. No stridor.  Abdominal:     General: Abdomen is flat. There is no distension.     Palpations: Abdomen is soft.     Tenderness: There is abdominal tenderness in the epigastric area.  Musculoskeletal:       Arms:     Cervical back: No rigidity.     Right lower leg: No edema.     Left lower leg: No edema.     Comments:  no crepitus or step-off on palpation. No rash   Skin:    General: Skin is warm and dry.     Capillary Refill: Capillary refill takes less than 2 seconds.  Neurological:     Mental Status: She is alert.  Psychiatric:        Mood and Affect: Mood normal.        Behavior: Behavior normal. Behavior is cooperative.     ED Results and Treatments Labs (all labs ordered are listed, but only abnormal results are displayed) Labs Reviewed  COMPREHENSIVE METABOLIC PANEL WITH GFR - Abnormal; Notable for the following components:      Result Value   Potassium 3.3 (*)    AST  13 (*)    All other components within normal limits  CBC WITH DIFFERENTIAL/PLATELET - Abnormal; Notable for the following components:   Hemoglobin 11.4 (*)    HCT 35.3 (*)    MCV 77.1 (*)    MCH 24.9 (*)    RDW 16.7 (*)    All other components within normal limits  LIPASE, BLOOD  HCG, SERUM, QUALITATIVE  TROPONIN I (HIGH SENSITIVITY)  Radiology DG Chest 2 View Result Date: 10/25/2023 CLINICAL DATA:  Epigastric pain EXAM: CHEST - 2 VIEW COMPARISON:  09/09/2023 FINDINGS: Chronic lung disease (lymphangioleiomyomatosis per chart) with diffuse interstitial coarsening. Postoperative right mid chest with lung sutures. There is no edema, consolidation, effusion, or pneumothorax. IMPRESSION: Chronic lung disease without acute superimposed finding. Electronically Signed   By: Tiburcio Pea M.D.   On: 10/25/2023 04:33    Pertinent labs & imaging results that were available during my care of the patient were reviewed by me and considered in my medical decision making (see MDM for details).  Medications Ordered in ED Medications  iohexol (OMNIPAQUE) 300 MG/ML solution 100 mL (has no administration in time range)  alum & mag hydroxide-simeth (MAALOX/MYLANTA) 200-200-20 MG/5ML suspension 30 mL (30 mLs Oral Given 10/25/23 0505)  morphine (PF) 4 MG/ML injection 4 mg (4 mg Intravenous Given 10/25/23 0504)  ondansetron (ZOFRAN) injection 4 mg (4 mg Intravenous Given 10/25/23 0505)  potassium chloride SA (KLOR-CON M) CR tablet 40 mEq (40 mEq Oral Given 10/25/23 5784)                                                                                                                                     Procedures Procedures  (including critical care time)  Medical Decision Making / ED Course    Medical Decision Making:    Brenda Williams is a 43 y.o. female with past medical history  as below, significant for anxiety, asthma, depression, GERD, PCOS, OSA on CPAP who presents to the ED with complaint of abdominal pain, low back pain.. The complaint involves an extensive differential diagnosis and also carries with it a high risk of complications and morbidity.  Serious etiology was considered. Ddx includes but is not limited to: Differential diagnosis includes but is not exclusive to acute cholecystitis, intrathoracic causes for epigastric abdominal pain, gastritis, duodenitis, pancreatitis, small bowel or large bowel obstruction, abdominal aortic aneurysm, hernia, gastritis, etc.   Complete initial physical exam performed, notably the patient was in no distress, sitting comfortably.    Reviewed and confirmed nursing documentation for past medical history, family history, social history.  Vital signs reviewed.   Clinical Course as of 10/25/23 0701  Sun Oct 25, 2023  0619 Hemoglobin(!): 11.4 Similar to baseline [SG]  0619 Potassium(!): 3.3 Similar to prior, chronically hypokalemic [SG]    Clinical Course User Index [SG] Sloan Leiter, DO    Brief summary: 43 year old female with history as above here with epigastric pain since last night, low back pain for approximately 1 month.  Her exam is reassuring, LE NVI.  Screening labs ordered in triage which are reassuring.  She is feeling better after analgesia and GI cocktail.  Labs are stable Imaging pending Handoff incoming EDP pending imaging and recheck               Additional history obtained: -Additional history obtained from na -  External records from outside source obtained and reviewed including: Chart review including previous notes, labs, imaging, consultation notes including  Primary care documentation, home medications   Lab Tests: -I ordered, reviewed, and interpreted labs.   The pertinent results include:   Labs Reviewed  COMPREHENSIVE METABOLIC PANEL WITH GFR - Abnormal; Notable for the  following components:      Result Value   Potassium 3.3 (*)    AST 13 (*)    All other components within normal limits  CBC WITH DIFFERENTIAL/PLATELET - Abnormal; Notable for the following components:   Hemoglobin 11.4 (*)    HCT 35.3 (*)    MCV 77.1 (*)    MCH 24.9 (*)    RDW 16.7 (*)    All other components within normal limits  LIPASE, BLOOD  HCG, SERUM, QUALITATIVE  TROPONIN I (HIGH SENSITIVITY)    Notable for mild hypokalemia, chronic anemia  EKG   EKG Interpretation Date/Time:  Sunday October 25 2023 04:07:14 EDT Ventricular Rate:  82 PR Interval:  169 QRS Duration:  88 QT Interval:  431 QTC Calculation: 504 R Axis:   54  Text Interpretation: Sinus rhythm Borderline T wave abnormalities Borderline prolonged QT interval similar to prior no stemi Confirmed by Russella Courts ([id number]) on 10/25/2023 6:17:10 AM         Imaging Studies ordered: I ordered imaging studies including chestx-ray, CT abdomen pelvis, CT lumbar > CT's PENDING I independently visualized the following imaging with scope of interpretation limited to determining acute life threatening conditions related to emergency care; findings noted above I independently visualized and interpreted imaging. I agree with the radiologist interpretation   Medicines ordered and prescription drug management: Meds ordered this encounter  Medications   alum & mag hydroxide-simeth (MAALOX/MYLANTA) 200-200-20 MG/5ML suspension 30 mL   morphine (PF) 4 MG/ML injection 4 mg   ondansetron (ZOFRAN) injection 4 mg   potassium chloride SA (KLOR-CON M) CR tablet 40 mEq   iohexol (OMNIPAQUE) 300 MG/ML solution 100 mL    -I have reviewed the patients home medicines and have made adjustments as needed   Consultations Obtained: na   Cardiac Monitoring: Continuous pulse oximetry interpreted by myself, 99% on RA.    Social Determinants of Health:  Diagnosis or treatment significantly limited by social determinants of health:  na   Reevaluation: After the interventions noted above, I reevaluated the patient and found that they have improved  Co morbidities that complicate the patient evaluation  Past Medical History:  Diagnosis Date   Abnormal Pap smear 2006   leep   Anxiety    Arthritis    osteoarthritis   Asthma    Complication of anesthesia    2014 d+c [facility], HAD ??  SEIZURE/ SHAKING   Cough    Depression    Fatigue    GERD (gastroesophageal reflux disease)    Herpes    never had outbreak. pos per blood, doesn't know which type   Lactose intolerance    Lymphangiomatosis    Polycystic ovarian syndrome    Prediabetes    Sleep apnea    uses CPAP   SVD (spontaneous vaginal delivery)    x 1   Varicose vein of leg    Vitamin D deficiency       Dispostion: Disposition decision including need for hospitalization was considered, and patient disposition pending at time of sign out.    Final Clinical Impression(s) / ED Diagnoses Final diagnoses:  Abdominal pain, unspecified abdominal location  Teddi Favors, DO 10/25/23 [phone]

## 2023-10-27 ENCOUNTER — Encounter: Payer: Self-pay | Admitting: Sports Medicine

## 2023-10-27 ENCOUNTER — Ambulatory Visit: Admitting: Sports Medicine

## 2023-10-27 VITALS — BP 130/88 | Ht 67.0 in | Wt 182.0 lb

## 2023-10-27 DIAGNOSIS — Z01419 Encounter for gynecological examination (general) (routine) without abnormal findings: Secondary | ICD-10-CM | POA: Diagnosis not present

## 2023-10-27 DIAGNOSIS — R87612 Low grade squamous intraepithelial lesion on cytologic smear of cervix (LGSIL): Secondary | ICD-10-CM | POA: Diagnosis not present

## 2023-10-27 DIAGNOSIS — M5136 Other intervertebral disc degeneration, lumbar region with discogenic back pain only: Secondary | ICD-10-CM

## 2023-10-27 DIAGNOSIS — Z6829 Body mass index (BMI) 29.0-29.9, adult: Secondary | ICD-10-CM | POA: Diagnosis not present

## 2023-10-27 MED ORDER — METHYLPREDNISOLONE ACETATE 40 MG/ML IJ SUSP
40.0000 mg | Freq: Once | INTRAMUSCULAR | Status: AC
  Administered 2023-10-27: 40 mg via INTRAMUSCULAR

## 2023-10-27 NOTE — Progress Notes (Signed)
   Subjective:    Patient ID: Brenda Williams, female    DOB: 13-Jul-1981, 43 y.o.   MRN: [id number]  HPI chief complaint low back pain  Patient is a very pleasant 43 year old female that presents today with approximately 3 weeks of severe low back pain.  She localizes the pain to the midline of her low back.  She gets pain both with walking as well as with sitting.  She notes some stiffness first thing in the morning as well.  Occasional numbness in the right leg but nothing longstanding.  Pain does not radiate into either leg.  She does have some arthritis in her right knee.  She usually takes Tylenol for pain but that has not been very effective recently.  She has a lumbar support on her chair at work.  No prior low back surgeries.  She has had small twinges of pain in her back previously but nothing quite this severe.  She has noticed that heat is helpful as well.  Past medical history reviewed Medications reviewed Allergies reviewed   Review of Systems As above    Objective:   Physical Exam  Well-developed, well-nourished.  No acute distress.  Lumbar spine: There is some tenderness to palpation along the lower lumbar spine.  No spasm.  Good lumbar range of motion but there is pain with extension.  Neurological exam shows reflexes to be equal at the Achilles and patellar tendons bilaterally.  Strength is 5/5 in both lower extremities.  CT scan of her lumbar spine shows focal advanced disc degeneration at L5-S1 with mild bilateral foraminal narrowing      Assessment & Plan:   Low back pain secondary to L5-S1 degenerative disc disease  Patient will start physical therapy at [facility].  She has a history of previous gastric bypass so she is unable to take oral NSAIDs.  We will inject her today with 40 mg of Depo-Medrol IM to help with her acute pain.  She will continue with Tylenol as well.  Continue with heat.  Follow-up with me 4 weeks after starting physical therapy.  This note was  dictated using Dragon naturally speaking software and may contain errors in syntax, spelling, or content which have not been identified prior to signing this note.

## 2023-10-30 LAB — HM PAP SMEAR

## 2023-11-05 DIAGNOSIS — J9601 Acute respiratory failure with hypoxia: Secondary | ICD-10-CM | POA: Diagnosis not present

## 2023-11-05 DIAGNOSIS — J8481 Lymphangioleiomyomatosis: Secondary | ICD-10-CM | POA: Diagnosis not present

## 2023-11-09 DIAGNOSIS — N946 Dysmenorrhea, unspecified: Secondary | ICD-10-CM | POA: Diagnosis not present

## 2023-11-09 DIAGNOSIS — N926 Irregular menstruation, unspecified: Secondary | ICD-10-CM | POA: Diagnosis not present

## 2023-11-11 ENCOUNTER — Ambulatory Visit: Admitting: Emergency Medicine

## 2023-11-11 ENCOUNTER — Encounter: Payer: Self-pay | Admitting: Emergency Medicine

## 2023-11-11 VITALS — BP 113/80 | HR 91 | Ht 67.0 in | Wt 183.6 lb

## 2023-11-11 DIAGNOSIS — J9601 Acute respiratory failure with hypoxia: Secondary | ICD-10-CM | POA: Diagnosis not present

## 2023-11-11 DIAGNOSIS — J8481 Lymphangioleiomyomatosis: Secondary | ICD-10-CM

## 2023-11-11 DIAGNOSIS — M545 Low back pain, unspecified: Secondary | ICD-10-CM | POA: Diagnosis not present

## 2023-11-11 DIAGNOSIS — J45909 Unspecified asthma, uncomplicated: Secondary | ICD-10-CM | POA: Diagnosis not present

## 2023-11-11 DIAGNOSIS — J4530 Mild persistent asthma, uncomplicated: Secondary | ICD-10-CM

## 2023-11-11 NOTE — Assessment & Plan Note (Signed)
 Continue Stiolto, albuterol  as needed.  We may decide to consider changing to or adding ICS at some point

## 2023-11-11 NOTE — Assessment & Plan Note (Signed)
 We will plan to repeat your pulmonary function testing in August 2025 We will plan to repeat your CT scan of the chest without contrast in August 2025. Please continue your sirolimus  2 mg once daily We will repeat your lab work in June 2025 You are at moderate to high risk for general anesthesia for your surgery due to your LAM and obstructive lung disease.  This does not preclude you from having the operation if the benefits outweigh these risks. Follow Dr. Baldwin Levee in June for lab work.  Please call sooner if you have any problems.

## 2023-11-11 NOTE — Progress Notes (Signed)
 Subjective:    Patient ID: Nadene Aures, female    DOB: 05/19/1981, 43 y.o.   MRN: [id number]  HPI  ROV 10/01/2023 --follow-up visit for Ms. Abel Abelson.  She is 43 with a history of LAM, OSA, asthma.  She has significant cystic disease and obstructive lung disease.  We performed a walking oximetry 3 weeks ago when I saw her and she did desaturate. We started her back on sirolimus  at that visit, 2 mg daily.  Her CBC and CMP were reassuring with the exception of some hypokalemia and hemoglobin 10.4. She is not currently on any BD therapy She is tolerating the sirolimus , does sometimes have nausea. It did delay her menstrual cycle slightly, caused some acne. She has seen some desats at home w exertion. She will get SOB w exertion, with singing, etc.   ROV 11/11/2023 --43 year old woman with LAM, obstructive lung disease/asthma, OSA.  She has cystic disease on CT scan of the chest, hypoxemia on exertion.  I have her on sirolimus  2 mg daily.    She feels that her breathing has improved, better able to exert. She has supplemental O2 after desats here, but is not really using reliably. She is checking to look for exertional desats. Has not seen any desats lately.   Sirolimus  level 4/1 >> 2.0 (low)      Latest Ref Rng & Units 10/25/2023    4:03 AM 10/13/2023    8:26 AM 09/10/2023    2:26 PM  CMP  Glucose 70 - 99 mg/dL 97  91  409   BUN 6 - 20 mg/dL 11  9  11    Creatinine 0.44 - 1.00 mg/dL 8.11  9.14  7.82   Sodium 135 - 145 mmol/L 137  140  138   Potassium 3.5 - 5.1 mmol/L 3.3  3.2  3.4   Chloride 98 - 111 mmol/L 107  106  107   CO2 22 - 32 mmol/L 22  25  24    Calcium  8.9 - 10.3 mg/dL 9.0  9.3  8.9   Total Protein 6.5 - 8.1 g/dL 7.4  7.2  7.0   Total Bilirubin 0.0 - 1.2 mg/dL 0.7  0.4  0.3   Alkaline Phos 38 - 126 U/L 66  69  60   AST 15 - 41 U/L 13  16  13    ALT 0 - 44 U/L 14  14  11         Latest Ref Rng & Units 10/25/2023    4:03 AM 10/13/2023    8:28 AM 09/10/2023    2:26 PM  CBC   WBC 4.0 - 10.5 K/uL 7.8  3.9  5.4   Hemoglobin 12.0 - 15.0 g/dL 95.6  21.3  08.6   Hematocrit 36.0 - 46.0 % 35.3  36.6  33.3   Platelets 150 - 400 K/uL 353  313.0  370.0      Review of Systems As per HPI     Objective:   Physical Exam Vitals:   11/11/23 1323  BP: 113/80  Pulse: 91  SpO2: 97%  Weight: 183 lb 9.6 oz (83.3 kg)  Height: 5\' 7"  (1.702 m)    Body mass index is 28.76 kg/m.  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 Please continue your sirolimus  2 mg once daily We will repeat your lab work in June 2025 You are at moderate to high risk for general anesthesia for your surgery due to your LAM and obstructive lung disease.  This does not preclude you from having the operation if the benefits outweigh these risks. Follow Dr. Baldwin Levee in June for lab work.  Please call sooner if you have any problems.  Acute respiratory failure with hypoxia (HCC) We will repeat your walking oximetry today.  If your oxygen level drops below 90% with exertion then you need to be sure to wear it when you are exerting yourself.  Asthma Continue Stiolto, albuterol  as needed.  We may decide to consider changing to or adding ICS at some point    Racheal Buddle, MD, PhD 11/11/2023, 1:46 PM [facility] [phone] or if no answer [phone]

## 2023-11-11 NOTE — Assessment & Plan Note (Signed)
 We will repeat your walking oximetry today.  If your oxygen level drops below 90% with exertion then you need to be sure to wear it when you are exerting yourself.

## 2023-11-11 NOTE — Patient Instructions (Addendum)
 We will plan to repeat your pulmonary function testing in August 2025 We will plan to repeat your CT scan of the chest without contrast in August 2025. Please continue your sirolimus  2 mg once daily We will repeat your lab work in June 2025 We will repeat your walking oximetry today.  If your oxygen level drops below 90% with exertion then you need to be sure to wear it when you are exerting yourself. You are at moderate to high risk for general anesthesia for your surgery due to your LAM and obstructive lung disease.  This does not preclude you from having the operation if the benefits outweigh these risks. Follow Dr. Baldwin Levee in June for lab work.  Please call sooner if you have any problems.

## 2023-11-12 ENCOUNTER — Telehealth: Payer: Self-pay

## 2023-11-12 NOTE — Addendum Note (Signed)
 Addended byGregory Leash, [place] Dost A on: 11/12/2023 08:39 AM   Modules accepted: Orders

## 2023-11-12 NOTE — Telephone Encounter (Signed)
 Received surgical clearance form at [facility] 11/11/23. Form has been completed by Dr. Baldwin Levee and faxed to [phone]

## 2023-11-25 DIAGNOSIS — N920 Excessive and frequent menstruation with regular cycle: Secondary | ICD-10-CM | POA: Diagnosis not present

## 2023-11-25 DIAGNOSIS — N946 Dysmenorrhea, unspecified: Secondary | ICD-10-CM | POA: Diagnosis not present

## 2023-11-30 ENCOUNTER — Other Ambulatory Visit

## 2023-12-05 DIAGNOSIS — J9601 Acute respiratory failure with hypoxia: Secondary | ICD-10-CM | POA: Diagnosis not present

## 2023-12-05 DIAGNOSIS — J8481 Lymphangioleiomyomatosis: Secondary | ICD-10-CM | POA: Diagnosis not present

## 2024-01-05 DIAGNOSIS — J9601 Acute respiratory failure with hypoxia: Secondary | ICD-10-CM | POA: Diagnosis not present

## 2024-01-05 DIAGNOSIS — J8481 Lymphangioleiomyomatosis: Secondary | ICD-10-CM | POA: Diagnosis not present

## 2024-01-12 ENCOUNTER — Telehealth: Payer: Self-pay

## 2024-01-12 NOTE — Telephone Encounter (Signed)
 Copied from CRM [id number]. Topic: General - Other >> Jan 06, 2024  9:33 AM Corean SAUNDERS wrote: Reason for CRM: Patient is requesting a handicap tag for her car from Dr. Shelah as she just started a new job and the parking lot is very big and from from her office. Please call patient back and advise.  Handicap placard paperwork has been placed up front for patient to fill out. Will wait for RB to return to office.

## 2024-01-14 NOTE — Telephone Encounter (Signed)
 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 Paperwork has been placed in Dr. Christin mailbox.

## 2024-01-19 NOTE — Telephone Encounter (Signed)
 Ppwk still in Dr. Lanny box. Will fwd to DOD (Dr. Darlean) to sign for PT and will advise his nurse today it is there. Please ret to front and we will call PT.  Also, please request O2 be canceled ASAP.

## 2024-01-19 NOTE — Telephone Encounter (Signed)
 PT here now to pick up DMV ppwk signed by Dr. Shelah and we were supposed to cancel her O2 and did not so now she is paying out of pocket, she says.

## 2024-01-19 NOTE — Telephone Encounter (Signed)
 Dr. Chari nurse [place])  advised if not an emergency to leave it for Dr. Shelah to sign. He will be back the 18th.

## 2024-01-20 ENCOUNTER — Telehealth: Payer: Self-pay

## 2024-01-20 NOTE — Telephone Encounter (Signed)
 Copied from CRM [id number]. Topic: Clinical - Order For Equipment >> Jan 18, 2024  1:13 PM Brenda Williams wrote: Reason for CRM: Pt stated she was told by Dr. Shelah on 4/30 that a call would be placed to [facility] to cancel her oxygen  prescription, but she is getting a bill for May - July that she cannot afford to pay. Pt stated it has been in the corner of her room since April 30th. [facility] phone number for rep [place] is [phone] and main phone number for [facility] is [phone]. Please follow up wit the pt at [phone] to leave a vm.  Called and spoke with [facility], they received order to discontinue O2 and will coordinate with pt O2 pickup. I was also informed this was a billing issue and they would contact pt with further information.  Called and spoke with the pt, informed pt of information above and pt verbalized understanding.

## 2024-01-20 NOTE — Telephone Encounter (Signed)
 I have received paperwork. Waiting for RB to return to office.

## 2024-01-21 ENCOUNTER — Emergency Department (HOSPITAL_BASED_OUTPATIENT_CLINIC_OR_DEPARTMENT_OTHER)
Admission: EM | Admit: 2024-01-21 | Discharge: 2024-01-21 | Disposition: A | Discharge status: Home / Self Care | Attending: Emergency Medicine | Admitting: Emergency Medicine

## 2024-01-21 ENCOUNTER — Emergency Department (HOSPITAL_BASED_OUTPATIENT_CLINIC_OR_DEPARTMENT_OTHER): Admitting: Radiology

## 2024-01-21 ENCOUNTER — Encounter (HOSPITAL_BASED_OUTPATIENT_CLINIC_OR_DEPARTMENT_OTHER): Payer: Self-pay | Admitting: Emergency Medicine

## 2024-01-21 ENCOUNTER — Emergency Department (HOSPITAL_BASED_OUTPATIENT_CLINIC_OR_DEPARTMENT_OTHER)

## 2024-01-21 ENCOUNTER — Ambulatory Visit: Payer: Self-pay | Admitting: *Deleted

## 2024-01-21 ENCOUNTER — Other Ambulatory Visit: Payer: Self-pay

## 2024-01-21 DIAGNOSIS — R519 Headache, unspecified: Secondary | ICD-10-CM | POA: Diagnosis not present

## 2024-01-21 DIAGNOSIS — R0602 Shortness of breath: Secondary | ICD-10-CM | POA: Insufficient documentation

## 2024-01-21 DIAGNOSIS — R0609 Other forms of dyspnea: Secondary | ICD-10-CM | POA: Insufficient documentation

## 2024-01-21 DIAGNOSIS — R55 Syncope and collapse: Secondary | ICD-10-CM | POA: Insufficient documentation

## 2024-01-21 LAB — TROPONIN T, HIGH SENSITIVITY: Troponin T High Sensitivity: <15 ng/L (ref <19)

## 2024-01-21 LAB — LIPASE, BLOOD: Lipase: 20 U/L (ref 11–51)

## 2024-01-21 LAB — COMPREHENSIVE METABOLIC PANEL WITH GFR
ALT: 13 U/L (ref 0–44)
AST: 15 U/L (ref 15–41)
Albumin: 4.4 g/dL (ref 3.5–5.0)
Alkaline Phosphatase: 73 U/L (ref 38–126)
Anion gap: 10 (ref 5–15)
BUN: 10 mg/dL (ref 6–20)
CO2: 22 mmol/L (ref 22–32)
Calcium: 9.6 mg/dL (ref 8.9–10.3)
Chloride: 108 mmol/L (ref 98–111)
Creatinine, Ser: 0.77 mg/dL (ref 0.44–1.00)
GFR, Estimated: >60 mL/min (ref >60)
Glucose, Bld: 54 mg/dL — ABNORMAL LOW (ref 70–99)
Potassium: 4 mmol/L (ref 3.5–5.1)
Sodium: 140 mmol/L (ref 135–145)
Total Bilirubin: 0.4 mg/dL (ref 0.0–1.2)
Total Protein: 7.7 g/dL (ref 6.5–8.1)

## 2024-01-21 LAB — URINALYSIS, ROUTINE W REFLEX MICROSCOPIC
Bacteria, UA: NONE SEEN
Bilirubin Urine: NEGATIVE
Glucose, UA: NEGATIVE mg/dL
Ketones, ur: NEGATIVE mg/dL
Leukocytes,Ua: NEGATIVE
Nitrite: NEGATIVE
Protein, ur: NEGATIVE mg/dL
Specific Gravity, Urine: 1.013 (ref 1.005–1.030)
pH: 6 (ref 5.0–8.0)

## 2024-01-21 LAB — CBC WITH DIFFERENTIAL/PLATELET
Abs Immature Granulocytes: 0.01 K/uL (ref 0.00–0.07)
Basophils Absolute: 0.1 K/uL (ref 0.0–0.1)
Basophils Relative: 1 %
Eosinophils Absolute: 0 K/uL (ref 0.0–0.5)
Eosinophils Relative: 1 %
HCT: 36.2 % (ref 36.0–46.0)
Hemoglobin: 11.2 g/dL — ABNORMAL LOW (ref 12.0–15.0)
Immature Granulocytes: 0 %
Lymphocytes Relative: 25 %
Lymphs Abs: 1.6 K/uL (ref 0.7–4.0)
MCH: 23.9 pg — ABNORMAL LOW (ref 26.0–34.0)
MCHC: 30.9 g/dL (ref 30.0–36.0)
MCV: 77.2 fL — ABNORMAL LOW (ref 80.0–100.0)
Monocytes Absolute: 0.3 K/uL (ref 0.1–1.0)
Monocytes Relative: 5 %
Neutro Abs: 4.5 K/uL (ref 1.7–7.7)
Neutrophils Relative %: 68 %
Platelets: 451 K/uL — ABNORMAL HIGH (ref 150–400)
RBC: 4.69 MIL/uL (ref 3.87–5.11)
RDW: 15.9 % — ABNORMAL HIGH (ref 11.5–15.5)
WBC: 6.5 K/uL (ref 4.0–10.5)
nRBC: 0 % (ref 0.0–0.2)

## 2024-01-21 LAB — CBG MONITORING, ED: Glucose-Capillary: 185 mg/dL — ABNORMAL HIGH (ref 70–99)

## 2024-01-21 LAB — D-DIMER, QUANTITATIVE: D-Dimer, Quant: <0.27 ug{FEU}/mL (ref 0.00–0.50)

## 2024-01-21 NOTE — Telephone Encounter (Signed)
 Copied from CRM [id number]. Topic: Clinical - Red Word Triage >> Jan 21, 2024 11:11 AM Shereese L wrote: [facility] that prompted transfer to Nurse Triage:  Bad headache for 3 days, lung patient shortness of breath increased,  cycle just went off and came right back on a couple days later Reason for Disposition  [1] MODERATE headache (e.g., interferes with normal activities) AND [2] present > 24 hours AND [3] unexplained  (Exceptions: Pain medicines not tried, typical migraine, or headache part of viral illness.)  Answer Assessment - Initial Assessment Questions 1. LOCATION: Where does it hurt?      I have a bad headache for 3 days.   It's not as bad this morning.   It's been really bad.   I'm really tired when I get home from work.   I had gastric bypass surgery 3 yrs ago   I've been feeling sick on my stomach.  My breathing has also been worse.   I have lung issues too.    Yesterday out of the blue I started having my period.   I just finished  my period last week.  I don't know what is going on. Yesterday I felt like I was about to pass out while sitting at my desk.   It just lasted a brief moment after walking in from lunch.   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   I have a lung doctor I see. All of this happening, I don't know what is going on.   I'm really tired and weak.    2. ONSET: When did the headache start? (e.g., minutes, hours, days)      For the 3 days.  Happened all at once. 3. PATTERN: Does the pain come and go, or has it been constant since it started?     The shortness of breath is happening even when I'm sitting down too.   I'm so tired.     She is going to call her pulmonary doctor.    4. SEVERITY: How bad is the pain? and What does it keep you from doing?  (e.g., Scale 1-10; mild, moderate, or severe)     The headache is so bad. 5. RECURRENT SYMPTOM: Have you ever had headaches before? If Yes, ask: When was the last time? and What happened that  time?      No   Not constantly like I'm doing now. 6. CAUSE: What do you think is causing the headache?     I don't know 7. MIGRAINE: Have you been diagnosed with migraine headaches? If Yes, ask: Is this headache similar?      No 8. HEAD INJURY: Has there been any recent injury to your head?      No 9. OTHER SYMPTOMS: Do you have any other symptoms? (e.g., fever, stiff neck, eye pain, sore throat, cold symptoms)     No sinus congestion.  10. PREGNANCY: Is there any chance you are pregnant? When was your last menstrual period?       Not asked  Protocols used: Headache-A-AH FYI Only or Action Required?: FYI only for provider.  Patient was last seen in primary care on 04/07/2023 by Frann Mabel Mt, DO.  Called Nurse Triage reporting Headache.  Symptoms began several days ago.  Interventions attempted: Ice/heat application.  Symptoms are: gradually worsening Bad headache for 3 days, vaginal bleeding, just completed her period last week, fatigue and shortness of breath.  Triage Disposition: See Physician Within 24 Hours  Patient/caregiver understands and will follow disposition?: Yes No appts today with any of the providers so going to the urgent care.   She mentioned she talked with her pulmonary specialist yesterday about the shortness of breath.   She can't miss any work due to being on a new job so reason going on to urgent care today.

## 2024-01-21 NOTE — Discharge Instructions (Signed)
 Workup without any acute findings.  That is reassuring.  Would recommend close follow-up with pulmonary medicine.  Return for any new or worse symptoms.  Work note provided.

## 2024-01-21 NOTE — ED Notes (Signed)
 Returned from [employer]

## 2024-01-21 NOTE — ED Triage Notes (Addendum)
 Pt here from home with c/o sob and weakness and no energy with some recent blurred vision , all symptoms started approx 5 days ago

## 2024-01-21 NOTE — ED Provider Notes (Addendum)
 [place] EMERGENCY DEPARTMENT AT [facility] Provider Note   CSN: [id number] Arrival date & time: 01/21/24  1140     Patient presents with: Weakness   Brenda Williams is a 43 y.o. female.   Patient presents today with multiple concerns.  Patient's had 3 to 4-day history of sort of a global headache a little bit better today but not completely resolved.  Associated with some intermittent blurred vision.  Patient followed by pulmonary medicine for emphysema and lymphangiomatosis.  Patient's had some near syncopal episodes.  And patient's had irregular menstrual cycle.  That came back after a normal menstrual cycle prematurely.  With the shortness of breath there is fatigue and that is worse from her baseline.       Prior to Admission medications   Medication Sig Start Date End Date Taking? Authorizing Provider  albuterol  (VENTOLIN  HFA) 108 (90 Base) MCG/ACT inhaler Inhale 2 puffs into the lungs every 4 (four) hours as needed for wheezing or shortness of breath. 08/26/23   Shelah Lamar GORMAN, MD  albuterol  (VENTOLIN  HFA) 108 (90 Base) MCG/ACT inhaler Inhale 2 puffs into the lungs every 6 (six) hours as needed for wheezing or shortness of breath. 09/10/23   Shelah Lamar GORMAN, MD  cyclobenzaprine  (FLEXERIL ) 10 MG tablet Take 1 tablet (10 mg total) by mouth 2 (two) times daily as needed for muscle spasms. 10/25/23   Curatolo, Adam, DO  hydrocortisone  (ANUSOL -HC) 25 MG suppository Place 1 suppository (25 mg total) rectally 2 (two) times daily. Patient not taking: Reported on 11/11/2023 12/22/22   Almarie Birmingham B, NP  hydrocortisone  (PROCTO-MED HC ) 2.5 % rectal cream Place 1 Application rectally 2 (two) times daily. Patient not taking: Reported on 11/11/2023 12/22/22   Almarie Birmingham B, NP  melatonin 1 MG TABS tablet Take 2 mg by mouth at bedtime as needed (sleep).    [provider]  Multiple Vitamins-Minerals (MULTIVITAMIN WITH MINERALS) tablet Take 1 tablet by mouth daily.    [provider]  sirolimus  (RAPAMUNE ) 2 MG tablet Take 1 tablet (2 mg total) by mouth daily. 09/10/23   Shelah Lamar GORMAN, MD  sucralfate  (CARAFATE ) 1 g tablet Take 1 tablet (1 g total) by mouth 2 (two) times daily for 14 days. 10/25/23 11/08/23  Curatolo, Adam, DO  Tiotropium Bromide-Olodaterol (STIOLTO RESPIMAT ) 2.5-2.5 MCG/ACT AERS Inhale 2 puffs into the lungs daily. 10/13/23   Shelah Lamar GORMAN, MD    Allergies: Fentanyl  and Hydromorphone     Review of Systems  Constitutional:  Negative for chills and fever.  HENT:  Negative for ear pain and sore throat.   Eyes:  Positive for visual disturbance. Negative for pain.  Respiratory:  Positive for shortness of breath. Negative for cough.   Cardiovascular:  Negative for chest pain, palpitations and leg swelling.  Gastrointestinal:  Negative for abdominal pain and vomiting.  Genitourinary:  Positive for vaginal bleeding. Negative for dysuria and hematuria.  Musculoskeletal:  Negative for arthralgias and back pain.  Skin:  Negative for color change and rash.  Neurological:  Positive for light-headedness and headaches. Negative for seizures and syncope.  All other systems reviewed and are negative.   Updated Vital Signs BP 122/83   Pulse 71   Temp 98 F (36.7 C) (Oral)   Resp 13   LMP 01/04/2024 (Exact Date)   SpO2 97%   Physical Exam Vitals and nursing note reviewed.  Constitutional:      General: She is not in acute distress.    Appearance:  Normal appearance. She is well-developed. She is not ill-appearing.  HENT:     Head: Normocephalic and atraumatic.     Mouth/Throat:     Mouth: Mucous membranes are moist.  Eyes:     Extraocular Movements: Extraocular movements intact.     Conjunctiva/sclera: Conjunctivae normal.     Pupils: Pupils are equal, round, and reactive to light.  Cardiovascular:     Rate and Rhythm: Normal rate and regular rhythm.     Heart sounds: No murmur heard. Pulmonary:     Effort: Pulmonary effort is normal. No  respiratory distress.     Breath sounds: Normal breath sounds. No wheezing or rales.  Abdominal:     General: There is no distension.     Palpations: Abdomen is soft.     Tenderness: There is no abdominal tenderness. There is no guarding.  Musculoskeletal:        General: No swelling.     Cervical back: Normal range of motion and neck supple.     Right lower leg: No edema.     Left lower leg: No edema.  Skin:    General: Skin is warm and dry.     Capillary Refill: Capillary refill takes less than 2 seconds.  Neurological:     General: No focal deficit present.     Mental Status: She is alert and oriented to person, place, and time.     Cranial Nerves: No cranial nerve deficit.     Sensory: No sensory deficit.     Motor: No weakness.  Psychiatric:        Mood and Affect: Mood normal.     (all labs ordered are listed, but only abnormal results are displayed) Labs Reviewed  CBC WITH DIFFERENTIAL/PLATELET - Abnormal; Notable for the following components:      Result Value   Hemoglobin 11.2 (*)    MCV 77.2 (*)    MCH 23.9 (*)    RDW 15.9 (*)    Platelets 451 (*)    All other components within normal limits  COMPREHENSIVE METABOLIC PANEL WITH GFR - Abnormal; Notable for the following components:   Glucose, Bld 54 (*)    All other components within normal limits  URINALYSIS, ROUTINE W REFLEX MICROSCOPIC - Abnormal; Notable for the following components:   Hgb urine dipstick SMALL (*)    All other components within normal limits  CBG MONITORING, ED - Abnormal; Notable for the following components:   Glucose-Capillary 185 (*)    All other components within normal limits  LIPASE, BLOOD  D-DIMER, QUANTITATIVE  TROPONIN T, HIGH SENSITIVITY    EKG: EKG Interpretation Date/Time:  Thursday January 21 2024 11:56:15 EDT Ventricular Rate:  89 PR Interval:  141 QRS Duration:  86 QT Interval:  369 QTC Calculation: 449 R Axis:   48  Text Interpretation: Sinus rhythm Ventricular  premature complex Abnormal T, consider ischemia, diffuse leads New since previous tracing Confirmed by Laniqua Torrens [phone]) on 01/21/2024 12:44:55 PM  Radiology: CT Head Wo Contrast Result Date: 01/21/2024 CLINICAL DATA:  Headache, increasing frequency or severity EXAM: CT HEAD WITHOUT CONTRAST TECHNIQUE: Contiguous axial images were obtained from the base of the skull through the vertex without intravenous contrast. RADIATION DOSE REDUCTION: This exam was performed according to the departmental dose-optimization program which includes automated exposure control, adjustment of the mA and/or kV according to patient size and/or use of iterative reconstruction technique. COMPARISON:  None Available. FINDINGS: Brain: The ventricles appear age appropriate. No mass effect  or midline shift. Gray-white differentiation is preserved without focal attenuation abnormality.No evidence of acute territorial infarction, extra-axial fluid collection, hemorrhage, or mass lesion. The basilar cisterns are patent without downward herniation. The cerebellar hemispheres and vermis are well formed without mass lesion or focal attenuation abnormality. Vascular: No hyperdense vessel. Skull: Normal. Negative for fracture or focal lesion. Sinuses/Orbits: The paranasal sinuses and mastoids are clear.The globes appear intact. No retrobulbar hematoma. Other: None. IMPRESSION: No acute intracranial abnormality, specifically, no acute hemorrhage, territorial infarction, or intracranial mass. Electronically Signed   By: Rogelia Myers M.D.   On: 01/21/2024 14:09   DG Chest 2 View Result Date: 01/21/2024 CLINICAL DATA:  Shortness of breath. EXAM: CHEST - 2 VIEW COMPARISON:  Chest radiograph dated 10/25/2023. FINDINGS: The heart size and mediastinal contours are within normal limits. Both lungs are clear. The visualized skeletal structures are unremarkable. IMPRESSION: No active cardiopulmonary disease. Electronically Signed   By: Vanetta Chou M.D.   On: 01/21/2024 13:27     Procedures   Medications Ordered in the ED - No data to display                                  Medical Decision Making Amount and/or Complexity of Data Reviewed Labs: ordered. Radiology: ordered.   Patient with multiple concerns.  Will go ahead and do a broad-spectrum workup.  It does sound as if patient's main concern was whether she was anemic.  I think that is what really drove her in here but she is complaining of things that are of some concern.  So we will do head CT chest x-ray get troponins because of the EKG changes D-dimer CBC complete metabolic panel lipase urinalysis pregnancy test.  Patient's urinalysis negative pregnancy test negative CBC white count 6.5 hemoglobin 11.2 which is baseline for her.  Complete metabolic panels normal renal functions normal liver function test are normal patient's platelet count is 451.  Troponin x 1 less than 15 so we need lipase normal at 20.  D-dimer also normal at 0.27 CT head without any acute findings chest x-ray without any acute findings.  Does seem as if patient does get some dyspnea with exertion.  And claims that her sats do go down but at rest her sats are in the upper 90s.  She is followed closely by pulmonary medicine and has medications that she takes.  Patient is comfortable with discharge home and follow-up with them.  Final diagnoses:  Shortness of breath  Acute nonintractable headache, unspecified headache type  Dyspnea on exertion  Near syncope    ED Discharge Orders     None          Geraldene Hamilton, MD 01/21/24 1254    Geraldene Hamilton, MD 01/21/24 1430

## 2024-01-21 NOTE — ED Notes (Signed)
 Reviewed AVS/discharge instruction with patient. Time allotted for and all questions answered. Patient is agreeable for d/c and escorted to ed exit by staff.

## 2024-01-21 NOTE — ED Notes (Signed)
 Patient transported to CT

## 2024-01-29 NOTE — Telephone Encounter (Signed)
 Handicap placard form has been signed & pt is aware. Placing form up at front desk. Nfn

## 2024-02-04 DIAGNOSIS — J8481 Lymphangioleiomyomatosis: Secondary | ICD-10-CM | POA: Diagnosis not present

## 2024-02-04 DIAGNOSIS — J9601 Acute respiratory failure with hypoxia: Secondary | ICD-10-CM | POA: Diagnosis not present

## 2024-02-23 ENCOUNTER — Telehealth: Payer: Self-pay

## 2024-02-23 ENCOUNTER — Encounter: Payer: Self-pay | Admitting: Emergency Medicine

## 2024-02-23 NOTE — Telephone Encounter (Signed)
 PCC's can you look into this.

## 2024-02-24 NOTE — Telephone Encounter (Signed)
 Response from [facility]:

## 2024-02-24 NOTE — Telephone Encounter (Signed)
 High priority message sent to [facility] for an update.

## 2024-03-21 ENCOUNTER — Telehealth (HOSPITAL_BASED_OUTPATIENT_CLINIC_OR_DEPARTMENT_OTHER): Payer: Self-pay

## 2024-03-21 DIAGNOSIS — J8481 Lymphangioleiomyomatosis: Secondary | ICD-10-CM

## 2024-03-21 MED ORDER — SIROLIMUS 2 MG PO TABS: 2.0000 mg | ORAL_TABLET | Freq: Every day | ORAL | 0 refills | Status: DC

## 2024-03-21 NOTE — Telephone Encounter (Addendum)
 I have sent a 2-week supply in. Please review Dr. Lanny last note - she was due for updated bloodwork in June 2025. She needs a sirolimus  trough level (needs labs drawn 30 min to 1 hr next dose is due). She also needs an updated CBC and CMP.  If she takes in morning, she needs to get labs done in morning before she takes med. She has an appt on 03/24/2024 with Dr. Shelah- she needs to be aware that she should be approximately 23-24 hours after last sirolimus  dose.  Patient must be scheduled for labs (trough sirolimus  level must be measured to adequately gauge if dose adjustment needs to be made) .   Sherry Pennant, PharmD, MPH, BCPS, CPP Clinical Pharmacist [facility])

## 2024-03-21 NOTE — Telephone Encounter (Signed)
 Called pt and there was no answer-LMTCB

## 2024-03-21 NOTE — Telephone Encounter (Unsigned)
 Copied from CRM #[id number]. Topic: Clinical - Prescription Issue >> Mar 18, 2024  3:53 PM Isabell A wrote: Reason for CRM: Patient was told by [facility] the office needs to call the prescription in for it to be expedited - sirolimus  (RAPAMUNE ) 2 MG tablet [[id number]].  [phone]

## 2024-03-22 NOTE — Telephone Encounter (Signed)
 Called the pt again and still no answer- LMTCB   Her ov with Dr. Shelah is scheduled for 03/24/24  Will route to him to keep him updated that she is needing labs but we have not been able to reach her. The orders were placed back in April 2025.

## 2024-03-23 ENCOUNTER — Other Ambulatory Visit (INDEPENDENT_AMBULATORY_CARE_PROVIDER_SITE_OTHER)

## 2024-03-23 ENCOUNTER — Ambulatory Visit: Payer: Self-pay | Admitting: Emergency Medicine

## 2024-03-23 DIAGNOSIS — J8481 Lymphangioleiomyomatosis: Secondary | ICD-10-CM

## 2024-03-23 LAB — COMPREHENSIVE METABOLIC PANEL WITH GFR
ALT: 11 U/L (ref 0–35)
AST: 15 U/L (ref 0–37)
Albumin: 4.2 g/dL (ref 3.5–5.2)
Alkaline Phosphatase: 55 U/L (ref 39–117)
BUN: 9 mg/dL (ref 6–23)
CO2: 26 meq/L (ref 19–32)
Calcium: 9.4 mg/dL (ref 8.4–10.5)
Chloride: 106 meq/L (ref 96–112)
Creatinine, Ser: 0.65 mg/dL (ref 0.40–1.20)
GFR: 106.8 mL/min (ref >60.00)
Glucose, Bld: 83 mg/dL (ref 70–99)
Potassium: 3.7 meq/L (ref 3.5–5.1)
Sodium: 139 meq/L (ref 135–145)
Total Bilirubin: 0.5 mg/dL (ref 0.2–1.2)
Total Protein: 7.1 g/dL (ref 6.0–8.3)

## 2024-03-23 LAB — CBC
HCT: 33.7 % — ABNORMAL LOW (ref 36.0–46.0)
Hemoglobin: 10.5 g/dL — ABNORMAL LOW (ref 12.0–15.0)
MCHC: 31.3 g/dL (ref 30.0–36.0)
MCV: 74.1 fl — ABNORMAL LOW (ref 78.0–100.0)
Platelets: 400 K/uL (ref 150.0–400.0)
RBC: 4.55 Mil/uL (ref 3.87–5.11)
RDW: 16.3 % — ABNORMAL HIGH (ref 11.5–15.5)
WBC: 3.9 K/uL — ABNORMAL LOW (ref 4.0–10.5)

## 2024-03-23 NOTE — Telephone Encounter (Signed)
 Thank you - I have had trouble keeping her blood work up to date. Will have to take this into account if we continue the sirolimus 

## 2024-03-23 NOTE — Progress Notes (Signed)
 Spoke with the patient and relayed lab results. Patient verbalized understanding.  Nothing further needed

## 2024-03-24 ENCOUNTER — Ambulatory Visit: Admitting: Emergency Medicine

## 2024-03-24 ENCOUNTER — Telehealth: Payer: Self-pay | Admitting: Emergency Medicine

## 2024-03-24 ENCOUNTER — Other Ambulatory Visit: Payer: Self-pay | Admitting: Emergency Medicine

## 2024-03-24 DIAGNOSIS — J8481 Lymphangioleiomyomatosis: Secondary | ICD-10-CM

## 2024-03-24 NOTE — Telephone Encounter (Signed)
 noted

## 2024-03-24 NOTE — Telephone Encounter (Unsigned)
 Copied from CRM #[id number]. Topic: Clinical - Medication Refill >> Mar 24, 2024  2:20 PM Whitney O wrote: Medication: sirolimus  (RAPAMUNE ) 2 MG tablet  Has the patient contacted their pharmacy? Yes they are calling for refill now first time filling with them  (Agent: If no, request that the patient contact the pharmacy for the refill. If patient does not wish to contact the pharmacy document the reason why and proceed with request.) (Agent: If yes, when and what did the pharmacy advise?)  This is the patient's preferred pharmacy:  [facility] [place] [facility] [place] [place] - [id number] Phone number [phone] Fax number [phone]  [id number] - [place], [place] - [address] [place] [address] Phone: [phone] Fax: [phone]  Is this the correct pharmacy for this prescription? Yes If no, delete pharmacy and type the correct one.  [facility] [place] [place] [place] - [id number] Phone number [phone] Fax number [phone]   Has the prescription been filled recently? Yes [facility]  Is the patient out of the medication? N/a didn't mention anything to the pharmacy about being out of medication  Has the patient been seen for an appointment in the last year OR does the patient have an upcoming appointment? Yes  Can we respond through MyChart? Yes  Agent: Please be advised that Rx refills may take up to 3 business days. We ask that you follow-up with your pharmacy.

## 2024-03-24 NOTE — Telephone Encounter (Signed)
 Copied from CRM #[id number]. Topic: Appointments - Scheduling Inquiry for Clinic >> Mar 24, 2024  3:03 PM Joesph PARAS wrote: Reason for CRM: Patient is wishing to reschedule appointment for Dr. Shelah. Provider has no availability at this time. Patient requesting to be worked in. Patient also requesting to have an order placed for a PFT and to be scheduled around the same time as her upcoming CT.  Please place order for PFT(if needed) so appointment can be scheduled.

## 2024-03-25 LAB — SIROLIMUS LEVEL: Sirolimus (Rapamycin): <1 ug/L — ABNORMAL LOW (ref 3.0–18.0)

## 2024-03-25 NOTE — Telephone Encounter (Signed)
 ATC X1. LMTCB

## 2024-03-28 ENCOUNTER — Telehealth: Payer: Self-pay

## 2024-03-28 DIAGNOSIS — J8481 Lymphangioleiomyomatosis: Secondary | ICD-10-CM

## 2024-03-28 DIAGNOSIS — Z5181 Encounter for therapeutic drug level monitoring: Secondary | ICD-10-CM

## 2024-03-28 MED ORDER — SIROLIMUS 2 MG PO TABS
2.0000 mg | ORAL_TABLET | Freq: Every day | ORAL | 0 refills | Status: DC
Start: 2024-03-28 — End: 2024-04-06

## 2024-03-28 MED ORDER — SIROLIMUS 2 MG PO TABS: 2.0000 mg | ORAL_TABLET | Freq: Every day | ORAL | 0 refills | Status: DC

## 2024-03-28 NOTE — Telephone Encounter (Signed)
 Copied from CRM #[id number]. Topic: Appointments - Scheduling Inquiry for Clinic >> Mar 24, 2024  3:03 PM Brenda Williams wrote: Reason for CRM: Patient is wishing to reschedule appointment for Dr. Shelah. Provider has no availability at this time. Patient requesting to be worked in. Patient also requesting to have an order placed for a PFT and to be scheduled around the same time as her upcoming CT. >> Mar 25, 2024  1:50 PM Brenda Williams wrote: Patient returning call to [facility]. Please return call to patient.    I called and spoke to pt. Pt would like the PFT and office visit on the same day. I will route to the front desk to schedule the PFT and return visit that is also atleast 2-3 weeks after the CT scan.

## 2024-03-28 NOTE — Telephone Encounter (Signed)
 Received request for follow-up, labs/medication titration for sirolimus  in separate thread (see 03/23/24 results follow-up).   Last sirolimus  levels subtherapeutic. Called patient to discuss. She reports she had been off sirolimus  5 days prior to lab on 03/23/24. At time of April labs, she recalls she had recently restarted sirolimus  (having taken approximately <5 doses of sirolimus ) at time of 10/13/23 lab.   Interruptions in sirolimus  therapy were due to changes in insurance. Per patient, Rx should be triaged to [facility] for more affordable copay.   Will send sirolimus  2mg  daily Rx to [facility] with plan for patient to call [facility] office location to schedule lab appt for sirolimus  trough in 14 days after she restarts taking sirolimus .   Patient verbalizes understanding and agreement with plan.  Aleck Puls, PharmD, BCPS Clinical Pharmacist  [facility]

## 2024-03-29 NOTE — Telephone Encounter (Signed)
 Patient has been called and appointments scheduled.

## 2024-03-31 NOTE — Telephone Encounter (Signed)
 ATCx1 sent mychart message

## 2024-04-06 ENCOUNTER — Other Ambulatory Visit: Payer: Self-pay | Admitting: Emergency Medicine

## 2024-04-06 DIAGNOSIS — J8481 Lymphangioleiomyomatosis: Secondary | ICD-10-CM

## 2024-04-07 NOTE — Telephone Encounter (Signed)
 Called PT no answer left VM.

## 2024-04-07 NOTE — Telephone Encounter (Signed)
 Sirolimus  dispensed on 03/29/24.   Based on dispense date, patient will need sirolimus  trough sometime the week of Sept 29.   Routing to scheduling team to assist with scheduling a lab appt (goal to obtain lab 14 days after she restarted taking sirolimus . Will need lab appt scheduled for just before she takes next sirolimus  dose to appropriately obtain trough).   Aleck Puls, PharmD, BCPS Clinical Pharmacist  [facility]

## 2024-04-18 ENCOUNTER — Other Ambulatory Visit

## 2024-04-21 ENCOUNTER — Other Ambulatory Visit

## 2024-04-21 DIAGNOSIS — Z5181 Encounter for therapeutic drug level monitoring: Secondary | ICD-10-CM | POA: Diagnosis not present

## 2024-04-21 DIAGNOSIS — J8481 Lymphangioleiomyomatosis: Secondary | ICD-10-CM

## 2024-04-21 NOTE — Addendum Note (Signed)
 Addended by: CLAUDENE NEVINS A on: 04/21/2024 08:26 AM   Modules accepted: Orders

## 2024-04-22 ENCOUNTER — Ambulatory Visit
Admission: RE | Admit: 2024-04-22 | Discharge: 2024-04-22 | Disposition: A | Discharge status: Home / Self Care | Source: Ambulatory Visit | Attending: Emergency Medicine | Admitting: Emergency Medicine

## 2024-04-22 DIAGNOSIS — J8481 Lymphangioleiomyomatosis: Secondary | ICD-10-CM | POA: Diagnosis not present

## 2024-04-23 LAB — SIROLIMUS LEVEL: Sirolimus, Blood: 3.1 ng/mL (ref 3.0–20.0)

## 2024-04-27 ENCOUNTER — Encounter: Payer: Self-pay | Admitting: Emergency Medicine

## 2024-04-27 ENCOUNTER — Ambulatory Visit

## 2024-04-27 ENCOUNTER — Other Ambulatory Visit: Payer: Self-pay

## 2024-04-27 ENCOUNTER — Ambulatory Visit: Admitting: Emergency Medicine

## 2024-04-27 VITALS — BP 128/70 | HR 86 | Temp 98.6°F | Ht 67.0 in | Wt 187.6 lb

## 2024-04-27 DIAGNOSIS — J8481 Lymphangioleiomyomatosis: Secondary | ICD-10-CM | POA: Diagnosis not present

## 2024-04-27 DIAGNOSIS — J4530 Mild persistent asthma, uncomplicated: Secondary | ICD-10-CM

## 2024-04-27 LAB — PULMONARY FUNCTION TEST
DL/VA % pred: 58 %
DL/VA: 2.5 ml/min/mmHg/L
DLCO cor % pred: 44 %
DLCO cor: 10.54 ml/min/mmHg
DLCO unc % pred: 39 %
DLCO unc: 9.46 ml/min/mmHg
FEF 25-75 Post: 1.14 L/s
FEF 25-75 Pre: 0.88 L/s
FEF2575-%Change-Post: 29 %
FEF2575-%Pred-Post: 36 %
FEF2575-%Pred-Pre: 27 %
FEV1-%Change-Post: 10 %
FEV1-%Pred-Post: 60 %
FEV1-%Pred-Pre: 54 %
FEV1-Post: 1.94 L
FEV1-Pre: 1.75 L
FEV1FVC-%Change-Post: 5 %
FEV1FVC-%Pred-Pre: 71 %
FEV6-%Change-Post: 4 %
FEV6-%Pred-Post: 78 %
FEV6-%Pred-Pre: 75 %
FEV6-Post: 3.1 L
FEV6-Pre: 2.95 L
FEV6FVC-%Change-Post: 0 %
FEV6FVC-%Pred-Post: 100 %
FEV6FVC-%Pred-Pre: 100 %
FVC-%Change-Post: 4 %
FVC-%Pred-Post: 78 %
FVC-%Pred-Pre: 74 %
FVC-Post: 3.14 L
FVC-Pre: 3 L
Post FEV1/FVC ratio: 62 %
Post FEV6/FVC ratio: 99 %
Pre FEV1/FVC ratio: 58 %
Pre FEV6/FVC Ratio: 98 %
RV % pred: 125 %
RV: 2.28 L
TLC % pred: 98 %
TLC: 5.42 L

## 2024-04-27 NOTE — Assessment & Plan Note (Signed)
 Moderate obstruction on her pulmonary function testing but she has not had a significant clinical response to BD therapy.  We will hold off on this for now but reconsider depending on course.

## 2024-04-27 NOTE — Progress Notes (Signed)
 Subjective:    Patient ID: Brenda Williams, female    DOB: [birth year], 43 y.o.   MRN: [id number]  HPI   ROV 04/27/2024 --43 year old woman with LAM, OSA, asthma.  Her CT chest has significant cystic disease and she has significant obstructive disease.  She has required oxygen  in the past but on her most recent walking oximetry 11/12/2023 she did not desaturate.  She is managed on sirolimus .  Her levels have typically been low but most recently 3.1 on 04/21/2024.  This is at the low end of the therapeutic range - on 2mg  every day with better compliance.  She had a CT chest 04/22/2024 and then PFT today. She is overall feeling ok. She does have some days w fatigue. She started Stiolto but did not feel any difference so she stopped it. Minimal cough. No BD use currently. She has been on the sirolimus  reliably for over 2 months.   Pulmonary function testing performed today and reviewed by me show severe obstruction with a borderline bronchodilator response the FEV1 is 1.75 L (54% predicted).  Lung volumes are normal.  The diffusion capacity is decreased.  The FEV1 is improved from 7 months ago, has decreased compared with 6 years ago.  High-resolution CT scan of the chest 04/22/2024 and reviewed by me shows small thin-walled cysts bilaterally as before a centrally calcified nodule in the right middle lobe 9 mm, postoperative scarring at the minor fissure, no new nodules or findings.      Latest Ref Rng & Units 03/23/2024    8:55 AM 01/21/2024    1:10 PM 10/25/2023    4:03 AM  CMP  Glucose 70 - 99 mg/dL 83  54  97   BUN 6 - 23 mg/dL 9  10  11    Creatinine 0.40 - 1.20 mg/dL 9.34  9.22  9.23   Sodium 135 - 145 mEq/L 139  140  137   Potassium 3.5 - 5.1 mEq/L 3.7  4.0  3.3   Chloride 96 - 112 mEq/L 106  108  107   CO2 19 - 32 mEq/L 26  22  22    Calcium  8.4 - 10.5 mg/dL 9.4  9.6  9.0   Total Protein 6.0 - 8.3 g/dL 7.1  7.7  7.4   Total Bilirubin 0.2 - 1.2 mg/dL 0.5  0.4  0.7   Alkaline Phos 39 -  117 U/L 55  73  66   AST 0 - 37 U/L 15  15  13    ALT 0 - 35 U/L 11  13  14         Latest Ref Rng & Units 03/23/2024    8:55 AM 01/21/2024    1:10 PM 10/25/2023    4:03 AM  CBC  WBC 4.0 - 10.5 K/uL 3.9  6.5  7.8   Hemoglobin 12.0 - 15.0 g/dL 89.4  88.7  88.5   Hematocrit 36.0 - 46.0 % 33.7  36.2  35.3   Platelets 150.0 - 400.0 K/uL 400.0  451  353      Review of Systems As per HPI     Objective:   Physical Exam Vitals:   04/27/24 1329  BP: 128/70  Pulse: 86  Temp: 98.6 F (37 C)  SpO2: 99%  Weight: 187 lb 9.6 oz (85.1 kg)  Height: 5' 7 (1.702 m)    Body mass index is 29.38 kg/m.  Gen: Pleasant, obese woman, in no distress,  normal affect  ENT: No lesions,  mouth clear,  oropharynx clear, no postnasal drip  Neck: No JVD, no stridor  Lungs: No use of accessory muscles, distant, clear without rales or rhonchi  Cardiovascular: RRR, heart sounds normal, no murmur or gallops, no peripheral edema  Musculoskeletal: No deformities, no cyanosis or clubbing  Neuro: alert, non focal  Skin: Warm, no lesions or rash    Assessment & Plan:    Lymphangioleiomyomatosis (HCC) Overall clinically stable.  We tried her on BD therapy given the obstruction on her PFT but she did not notice any significant difference so she is off of Stiolto or any BD right now.  Her PFT and CT chest are abnormal but stable.  There has been a decrease in her FEV1 compared with remote studies up to 6 years ago.  Plan to continue to follow at least annually or if she develops a clinical change.  Continue sirolimus  and follow her BMP, liver tests, CBC, sirolimus  level.  We reviewed your pulmonary function testing and your CT scan of the chest today.  These are stable.  Good news.  We will probably repeat these in 1 year We will hold off on starting any inhaled medication for now.  We can consider this going forward. Please continue your sirolimus  2 mg once daily. We will repeat your lab work including  your sirolimus  level in January 2026. Please notify our office if you develop any changes in your breathing or any new respiratory symptoms. Follow with Dr. Shelah in January, sooner if you have problems  Asthma Moderate obstruction on her pulmonary function testing but she has not had a significant clinical response to BD therapy.  We will hold off on this for now but reconsider depending on course.     Lamar Shelah, MD, PhD 04/27/2024, 1:54 PM [facility] [phone] or if no answer [phone]

## 2024-04-27 NOTE — Progress Notes (Signed)
 Full pft performed today

## 2024-04-27 NOTE — Patient Instructions (Signed)
 We reviewed your pulmonary function testing and your CT scan of the chest today.  These are stable.  Good news.  We will probably repeat these in 1 year We will hold off on starting any inhaled medication for now.  We can consider this going forward. Please continue your sirolimus  2 mg once daily. We will repeat your lab work including your sirolimus  level in January 2026. Please notify our office if you develop any changes in your breathing or any new respiratory symptoms. Follow with Dr. Shelah in January, sooner if you have problems

## 2024-04-27 NOTE — Patient Instructions (Signed)
 Full pft performed today

## 2024-04-27 NOTE — Assessment & Plan Note (Signed)
 Overall clinically stable.  We tried her on BD therapy given the obstruction on her PFT but she did not notice any significant difference so she is off of Stiolto or any BD right now.  Her PFT and CT chest are abnormal but stable.  There has been a decrease in her FEV1 compared with remote studies up to 6 years ago.  Plan to continue to follow at least annually or if she develops a clinical change.  Continue sirolimus  and follow her BMP, liver tests, CBC, sirolimus  level.  We reviewed your pulmonary function testing and your CT scan of the chest today.  These are stable.  Good news.  We will probably repeat these in 1 year We will hold off on starting any inhaled medication for now.  We can consider this going forward. Please continue your sirolimus  2 mg once daily. We will repeat your lab work including your sirolimus  level in January 2026. Please notify our office if you develop any changes in your breathing or any new respiratory symptoms. Follow with Dr. Shelah in January, sooner if you have problems

## 2024-04-29 ENCOUNTER — Other Ambulatory Visit: Payer: Self-pay | Admitting: Emergency Medicine

## 2024-04-29 DIAGNOSIS — J8481 Lymphangioleiomyomatosis: Secondary | ICD-10-CM

## 2024-05-11 ENCOUNTER — Encounter

## 2024-05-11 ENCOUNTER — Ambulatory Visit: Admitting: Emergency Medicine

## 2024-06-23 ENCOUNTER — Other Ambulatory Visit: Payer: Self-pay | Admitting: Emergency Medicine

## 2024-06-23 DIAGNOSIS — J8481 Lymphangioleiomyomatosis: Secondary | ICD-10-CM

## 2024-07-06 ENCOUNTER — Other Ambulatory Visit: Payer: Self-pay | Admitting: Emergency Medicine

## 2024-07-06 DIAGNOSIS — J8481 Lymphangioleiomyomatosis: Secondary | ICD-10-CM

## 2024-07-08 ENCOUNTER — Other Ambulatory Visit: Payer: Self-pay | Admitting: Emergency Medicine

## 2024-07-08 DIAGNOSIS — J8481 Lymphangioleiomyomatosis: Secondary | ICD-10-CM

## 2024-07-08 NOTE — Telephone Encounter (Signed)
 Daisy from [facility] [phone] is requesting a refill for sirolimus  (RAPAMUNE ) 2 MG tablet, patent is due to shipment to be sent out today. Patient has two days left of medication.    Attempted to call patient to inquire about any new symptoms and verify how much medication she had left. No answer--Left a voicemail for patient to call back.

## 2024-07-08 NOTE — Telephone Encounter (Signed)
 Copied from CRM #[id number]. Topic: Clinical - Medication Refill >> Jul 08, 2024  9:22 AM Leila BROCKS wrote: Most Recent Pulmonary Care Visit:  Provider: Dr. Lamar Chris Department: [facility] Date: 04/27/24 Medication(s): sirolimus  (RAPAMUNE ) 2 MG tablet  Has the patient contacted their pharmacy? Yes, Daisy from [facility] [phone] is requesting a refill for sirolimus  (RAPAMUNE ) 2 MG tablet, patent is due to shipment to be sent out today. Patient has two days left of medication.  (Agent: If no, request that the patient contact the pharmacy for the refill. If patient does not wish to contact the pharmacy document the reason why and proceed with request.) (Agent: If yes, when and what did the pharmacy advise?)  This is the patient's preferred pharmacy:  [facility] [place] [facility] [address] [place] [address] Phone: [phone] Fax: [phone]  Is this the correct pharmacy for this prescription? Yes If no, delete pharmacy and type the correct one.   Has the prescription been filled recently? No  Is the patient out of the medication? Yes, 2 days left of medication.   Has the patient been seen for an appointment in the last year OR does the patient have an upcoming appointment? Yes  Can we respond through MyChart?   Agent: Please be advised that Rx refills may take up to 3 business days. We ask that you follow-up with your pharmacy.

## 2024-07-20 ENCOUNTER — Emergency Department (HOSPITAL_BASED_OUTPATIENT_CLINIC_OR_DEPARTMENT_OTHER)

## 2024-07-20 ENCOUNTER — Encounter (HOSPITAL_BASED_OUTPATIENT_CLINIC_OR_DEPARTMENT_OTHER): Payer: Self-pay

## 2024-07-20 ENCOUNTER — Other Ambulatory Visit: Payer: Self-pay

## 2024-07-20 ENCOUNTER — Emergency Department (HOSPITAL_BASED_OUTPATIENT_CLINIC_OR_DEPARTMENT_OTHER)
Admission: EM | Admit: 2024-07-20 | Discharge: 2024-07-21 | Disposition: A | Discharge status: Home / Self Care | Attending: Emergency Medicine | Admitting: Emergency Medicine

## 2024-07-20 DIAGNOSIS — R600 Localized edema: Secondary | ICD-10-CM | POA: Diagnosis not present

## 2024-07-20 DIAGNOSIS — R0789 Other chest pain: Secondary | ICD-10-CM | POA: Insufficient documentation

## 2024-07-20 DIAGNOSIS — D649 Anemia, unspecified: Secondary | ICD-10-CM

## 2024-07-20 DIAGNOSIS — R0602 Shortness of breath: Secondary | ICD-10-CM | POA: Insufficient documentation

## 2024-07-20 LAB — CBC
HCT: 30.4 % — ABNORMAL LOW (ref 36.0–46.0)
Hemoglobin: 9.2 g/dL — ABNORMAL LOW (ref 12.0–15.0)
MCH: 21.3 pg — ABNORMAL LOW (ref 26.0–34.0)
MCHC: 30.3 g/dL (ref 30.0–36.0)
MCV: 70.4 fL — ABNORMAL LOW (ref 80.0–100.0)
Platelets: 442 K/uL — ABNORMAL HIGH (ref 150–400)
RBC: 4.32 MIL/uL (ref 3.87–5.11)
RDW: 16.8 % — ABNORMAL HIGH (ref 11.5–15.5)
WBC: 6 K/uL (ref 4.0–10.5)
nRBC: 0 % (ref 0.0–0.2)

## 2024-07-20 LAB — BASIC METABOLIC PANEL WITH GFR
Anion gap: 12 (ref 5–15)
BUN: 10 mg/dL (ref 6–20)
CO2: 22 mmol/L (ref 22–32)
Calcium: 9.1 mg/dL (ref 8.9–10.3)
Chloride: 105 mmol/L (ref 98–111)
Creatinine, Ser: 0.66 mg/dL (ref 0.44–1.00)
GFR, Estimated: >60 mL/min
Glucose, Bld: 93 mg/dL (ref 70–99)
Potassium: 3.4 mmol/L — ABNORMAL LOW (ref 3.5–5.1)
Sodium: 139 mmol/L (ref 135–145)

## 2024-07-20 LAB — PREGNANCY, URINE: Preg Test, Ur: NEGATIVE

## 2024-07-20 LAB — TROPONIN T, HIGH SENSITIVITY: Troponin T High Sensitivity: <15 ng/L (ref 0–19)

## 2024-07-20 MED ORDER — ACETAMINOPHEN 325 MG PO TABS
650.0000 mg | ORAL_TABLET | Freq: Once | ORAL | Status: AC
  Administered 2024-07-20: 650 mg via ORAL
  Filled 2024-07-20: qty 2

## 2024-07-20 MED ORDER — IOHEXOL 350 MG/ML SOLN
75.0000 mL | Freq: Once | INTRAVENOUS | Status: AC | PRN
  Administered 2024-07-20: 75 mL via INTRAVENOUS

## 2024-07-20 MED ORDER — IPRATROPIUM-ALBUTEROL 0.5-2.5 (3) MG/3ML IN SOLN
3.0000 mL | Freq: Once | RESPIRATORY_TRACT | Status: AC
  Administered 2024-07-20: 3 mL via RESPIRATORY_TRACT
  Filled 2024-07-20: qty 3

## 2024-07-20 NOTE — ED Triage Notes (Signed)
 Pt arrives with c/o chest pressure that started today. Pt reports SOB and BLE swelling.

## 2024-07-20 NOTE — ED Provider Notes (Signed)
 Care assumed at 2300.  Patient with history of LAM here with chest pain.  Care assumed pending CTA.  CTA with chronic changes, no acute abnormality.  Discussed with patient findings of studies.  Discussed finding of anemia on CBC.  Discussed need for PCP follow-up for further evaluation as well as return precautions.   Griselda Norris, MD 07/21/24 [phone]

## 2024-07-20 NOTE — ED Provider Notes (Signed)
 "  EMERGENCY DEPARTMENT AT [facility] Provider Note   CSN: [id number] Arrival date & time: 07/20/24  1813     Patient presents with: Chest Pain   Brenda Williams is a 44 y.o. female.   Patient is a 44 year old female with a history of LAM who is on chronic immunosuppressive medications who is presenting today with complaint of shortness of breath and some heaviness in her chest.  She reports yesterday she felt a little off but today is really when she noticed feeling winded.  She feels short of breath all the time but it is worse with any type of exertion.  She has not had a new cough, fever but reports now she is getting a little bit of a headache.  She has not had any recent medication changes and reports that heat she has checked her oxygen  today and everything is looked okay.  She has not had any abdominal pain.  No significant chest pain.  Later today she did notice some swelling in her ankles and feet bilaterally.  She has not had any unilateral leg pain and denies any recent immobilization.  She takes no anticoagulants or OCPs.  She did try to take her albuterol  pump this morning but did not feel like it helped.  She has no known heart or kidney problems.  The history is provided by the patient.  Chest Pain      Prior to Admission medications  Medication Sig Start Date End Date Taking? Authorizing Provider  albuterol  (VENTOLIN  HFA) 108 (90 Base) MCG/ACT inhaler Inhale 2 puffs into the lungs every 4 (four) hours as needed for wheezing or shortness of breath. 08/26/23   Shelah Lamar GORMAN, MD  albuterol  (VENTOLIN  HFA) 108 [phone] Base) MCG/ACT inhaler Inhale 2 puffs into the lungs every 6 (six) hours as needed for wheezing or shortness of breath. 09/10/23   Shelah Lamar GORMAN, MD  cyclobenzaprine  (FLEXERIL ) 10 MG tablet Take 1 tablet (10 mg total) by mouth 2 (two) times daily as needed for muscle spasms. Patient not taking: Reported on 04/27/2024 10/25/23   Ruthe Cornet, DO   hydrocortisone  (ANUSOL -HC) 25 MG suppository Place 1 suppository (25 mg total) rectally 2 (two) times daily. Patient not taking: Reported on 04/27/2024 12/22/22   Almarie Waddell NOVAK, NP  hydrocortisone  (PROCTO-MED HC ) 2.5 % rectal cream Place 1 Application rectally 2 (two) times daily. Patient not taking: Reported on 04/27/2024 12/22/22   Almarie Waddell B, NP  melatonin 1 MG TABS tablet Take 2 mg by mouth at bedtime as needed (sleep).    [provider]  Multiple Vitamins-Minerals (MULTIVITAMIN WITH MINERALS) tablet Take 1 tablet by mouth daily.    [provider]  sirolimus  (RAPAMUNE ) 2 MG tablet TAKE 1 TABLET BY MOUTH 1 TIME A DAY. 07/08/24   Shelah Lamar GORMAN, MD  sucralfate  (CARAFATE ) 1 g tablet Take 1 tablet (1 g total) by mouth 2 (two) times daily for 14 days. Patient not taking: Reported on 04/27/2024 10/25/23 11/08/23  Ruthe Cornet, DO  Tiotropium Bromide-Olodaterol (STIOLTO RESPIMAT ) 2.5-2.5 MCG/ACT AERS Inhale 2 puffs into the lungs daily. Patient not taking: Reported on 04/27/2024 10/13/23   Shelah Lamar GORMAN, MD    Allergies: Fentanyl  and Hydromorphone     Review of Systems  Cardiovascular:  Positive for chest pain.    Updated Vital Signs BP (!) 181/96 (BP Location: Right Arm)   Pulse 69   Temp 97.9 F (36.6 C)   Resp 18   Wt 84.8  kg   SpO2 100%   BMI 29.29 kg/m   Physical Exam Vitals and nursing note reviewed.  Constitutional:      General: She is not in acute distress.    Appearance: She is well-developed.  HENT:     Head: Normocephalic and atraumatic.  Eyes:     Conjunctiva/sclera: Conjunctivae normal.     Pupils: Pupils are equal, round, and reactive to light.  Cardiovascular:     Rate and Rhythm: Normal rate and regular rhythm.     Heart sounds: No murmur heard. Pulmonary:     Effort: Pulmonary effort is normal. No respiratory distress.     Breath sounds: Normal breath sounds. No wheezing or rales.     Comments: Mild decreased breath sounds  bilaterally Abdominal:     General: There is no distension.     Palpations: Abdomen is soft.     Tenderness: There is no abdominal tenderness. There is no guarding or rebound.  Musculoskeletal:        General: No tenderness. Normal range of motion.     Cervical back: Normal range of motion and neck supple.     Comments: Minimal nonpitting edema present in the ankles bilaterally.  No calf pain  Skin:    General: Skin is warm and dry.     Findings: No erythema or rash.  Neurological:     Mental Status: She is alert and oriented to person, place, and time. Mental status is at baseline.  Psychiatric:        Behavior: Behavior normal.     (all labs ordered are listed, but only abnormal results are displayed) Labs Reviewed  BASIC METABOLIC PANEL WITH GFR - Abnormal; Notable for the following components:      Result Value   Potassium 3.4 (*)    All other components within normal limits  CBC - Abnormal; Notable for the following components:   Hemoglobin 9.2 (*)    HCT 30.4 (*)    MCV 70.4 (*)    MCH 21.3 (*)    RDW 16.8 (*)    Platelets 442 (*)    All other components within normal limits  PREGNANCY, URINE  TROPONIN T, HIGH SENSITIVITY  TROPONIN T, HIGH SENSITIVITY    EKG: None  Radiology: DG Chest 2 View Result Date: 07/20/2024 CLINICAL DATA:  Chest pain EXAM: CHEST - 2 VIEW COMPARISON:  01/21/2024, CT 04/22/2024 FINDINGS: The heart size and mediastinal contours are within normal limits. No focal opacity. Mild diffuse interstitial opacities, likely corresponding to the innumerable cysts/history of LAM on CT. CT demonstrated right middle lobe pulmonary nodule is not well seen radiographically. The visualized skeletal structures are unremarkable. Minimal scarring in the right mid lung. IMPRESSION: No active cardiopulmonary disease. Mild diffuse interstitial opacities corresponding to chronic lung disease/known history of LAM as was seen on previous CTs Electronically Signed   By: Luke Bun M.D.   On: 07/20/2024 18:56     Procedures   Medications Ordered in the ED  ipratropium-albuterol  (DUONEB) 0.5-2.5 (3) MG/3ML nebulizer solution 3 mL (3 mLs Nebulization Given 07/20/24 2150)                                    Medical Decision Making Amount and/or Complexity of Data Reviewed Labs: ordered. Radiology: ordered.  Risk Prescription drug management.   Pt with multiple medical problems and comorbidities and presenting today with a complaint  that caries a high risk for morbidity and mortality.  Here today with the above complaints.  Concern for exacerbation of her chronic lung disease, developing pneumonia, early viral respiratory cause versus PE versus acute cardiac cause or pneumothorax.  Also concern for possible new renal disease.  Patient is overall well-appearing and oxygen  saturation is at 100%.  Initially upon arrival she did have an elevated blood pressure of 181/96 which she reports is unusual because she does not usually have blood pressure problems.  She has not had any recent medication changes.  At home she reports her oxygen  was normal as well. 10:23 PM I independently interpreted patient's labs and EKG.  CBC without acute findings except for hemoglobin of 9.2 from 11 a few months ago but patient does report she has intermittent heavy periods, troponin was normal and that is after 12 hours of symptoms, BMP within normal limits and pregnancy is negative.  EKG EKG shows some nonspecific T wave changes but improved from prior EKG which had significant T wave inversion. I have independently visualized and interpreted pt's images today.  Chest x-ray showing chronic changes but no acute findings.  Will do a CT to ensure no evidence of PE or other acute changes in her known lung disease.       Final diagnoses:  None    ED Discharge Orders     None          Doretha Folks, MD 07/21/24 0004  "

## 2024-07-21 ENCOUNTER — Encounter: Payer: Self-pay | Admitting: Family Medicine

## 2024-07-21 ENCOUNTER — Telehealth: Payer: Self-pay

## 2024-07-21 NOTE — Telephone Encounter (Signed)
 Tried calling Pt- call goes straight to voicemail. LMOM informing that PCP could see her tomorrow.

## 2024-07-21 NOTE — Telephone Encounter (Signed)
 Pt called back and now all appts are taken, what time would you like to work her in?

## 2024-07-21 NOTE — Telephone Encounter (Signed)
 We can get her in tomorrow if she prefers that. Prefer to see her in person so we can repeat some labs. Thx.

## 2024-07-21 NOTE — Telephone Encounter (Signed)
 Copied from CRM #[id number]. Topic: Clinical - Lab/Test Results >> Jul 21, 2024 10:48 AM Tinnie BROCKS wrote: Reason for CRM: Pt is concerned about the number of abnormal results seen in hospital labs and does not want to wait until 1/13 appt to talk about them. She wants to know if a nurse or anyone can give her a call to explain these results. Please return pts call at [phone]

## 2024-07-21 NOTE — Telephone Encounter (Signed)
 Copied from CRM #[id number]. Topic: Appointments - Appointment Cancel/Reschedule >> Jul 21, 2024 12:41 PM Rea ORN wrote: Pt returned missed call from Camyah Pultz. Pt would like Karielle Davidow to schedule the appt for her tomorrow and then send a Mychart message about it. She said for some reason her phone calls from the clinic go straight to voicemail.

## 2024-07-22 ENCOUNTER — Ambulatory Visit: Admitting: Family Medicine

## 2024-07-22 ENCOUNTER — Ambulatory Visit: Payer: Self-pay | Admitting: Family Medicine

## 2024-07-22 ENCOUNTER — Encounter: Payer: Self-pay | Admitting: Family Medicine

## 2024-07-22 VITALS — BP 132/74 | HR 67 | Ht 67.0 in | Wt 189.0 lb

## 2024-07-22 DIAGNOSIS — E876 Hypokalemia: Secondary | ICD-10-CM | POA: Diagnosis not present

## 2024-07-22 DIAGNOSIS — D509 Iron deficiency anemia, unspecified: Secondary | ICD-10-CM | POA: Diagnosis not present

## 2024-07-22 DIAGNOSIS — R7303 Prediabetes: Secondary | ICD-10-CM

## 2024-07-22 LAB — CBC
HCT: 31 % — ABNORMAL LOW (ref 36.0–46.0)
Hemoglobin: 9.4 g/dL — ABNORMAL LOW (ref 12.0–15.0)
MCHC: 30.5 g/dL (ref 30.0–36.0)
MCV: 69.3 fl — ABNORMAL LOW (ref 78.0–100.0)
Platelets: 399 K/uL (ref 150.0–400.0)
RBC: 4.47 Mil/uL (ref 3.87–5.11)
RDW: 17.1 % — ABNORMAL HIGH (ref 11.5–15.5)
WBC: 4.6 K/uL (ref 4.0–10.5)

## 2024-07-22 LAB — IBC + FERRITIN
Ferritin: 2.2 ng/mL — ABNORMAL LOW (ref 10.0–291.0)
Iron: 17 ug/dL — ABNORMAL LOW (ref 42–145)
Saturation Ratios: 3.2 % — ABNORMAL LOW (ref 20.0–50.0)
TIBC: 529.2 ug/dL — ABNORMAL HIGH (ref 250.0–450.0)
Transferrin: 378 mg/dL — ABNORMAL HIGH (ref 212.0–360.0)

## 2024-07-22 LAB — BASIC METABOLIC PANEL WITH GFR
BUN: 9 mg/dL (ref 6–23)
CO2: 29 meq/L (ref 19–32)
Calcium: 9.1 mg/dL (ref 8.4–10.5)
Chloride: 106 meq/L (ref 96–112)
Creatinine, Ser: 0.69 mg/dL (ref 0.40–1.20)
GFR: 105.02 mL/min
Glucose, Bld: 80 mg/dL (ref 70–99)
Potassium: 3.8 meq/L (ref 3.5–5.1)
Sodium: 140 meq/L (ref 135–145)

## 2024-07-22 LAB — MAGNESIUM: Magnesium: 2.2 mg/dL (ref 1.5–2.5)

## 2024-07-22 LAB — HEMOGLOBIN A1C: Hgb A1c MFr Bld: 5.9 % (ref 4.6–6.5)

## 2024-07-22 NOTE — Progress Notes (Signed)
 Chief Complaint  Patient presents with   Hospitalization Follow-up    Pt would like to go over results for the hospital     Subjective: Patient is a 44 y.o. female here for ER follow-up.  Patient went to the ER on 07/20/2024 for atypical chest pain.  Workup was largely unremarkable.  Hemoglobin is below her baseline at 9.2 along with lower hematocrit, increased platelets and RDW.  Potassium is low at 3.4.  She had not been having any nausea or vomiting/diarrhea.  She is eating and drinking normally mostly.  Denies any bleeding.  Historically has had slightly low hemoglobin levels with normal iron.  Past Medical History:  Diagnosis Date   Abnormal Pap smear 2006   leep   Anxiety    Arthritis    osteoarthritis   Asthma    Complication of anesthesia    2014 d+c [facility], HAD ??  SEIZURE/ SHAKING   Cough    Depression    Fatigue    GERD (gastroesophageal reflux disease)    Herpes    never had outbreak. pos per blood, doesn't know which type   Lactose intolerance    Lymphangiomatosis    Polycystic ovarian syndrome    Prediabetes    Sleep apnea    uses CPAP   SVD (spontaneous vaginal delivery)    x 1   Varicose vein of leg    Vitamin D  deficiency     Objective: BP 132/74 (BP Location: Left Arm, Patient Position: Sitting, Cuff Size: Normal)   Pulse 67   Ht 5' 7 (1.702 m)   Wt 189 lb (85.7 kg)   LMP 06/18/2024   SpO2 94%   BMI 29.60 kg/m  General: Awake, appears stated age Heart: RRR, no LE edema Lungs: CTAB, no rales, wheezes or rhonchi. No accessory muscle use Mouth: MMM Psych: Age appropriate judgment and insight, normal affect and mood  Assessment and Plan: Microcytic anemia - Plan: IBC + Ferritin, CBC  Hypokalemia - Plan: Basic metabolic panel with GFR, Magnesium   Prediabetes - Plan: Hemoglobin A1c  Check above.  If normal, will refer to the hematology team given her symptoms. Follow-up on this.  She was not given any supplementation in the ER but  hopefully it is normalized. Follow-up on A1c levels. The patient voiced understanding and agreement to the plan.  Mabel [place], DO 07/22/2024  12:38 PM

## 2024-07-22 NOTE — Patient Instructions (Signed)
 Give [place] 2-3 business days to get the results of your labs back.  Let us know if you need anything.

## 2024-07-25 ENCOUNTER — Other Ambulatory Visit: Payer: Self-pay | Admitting: Emergency Medicine

## 2024-07-25 DIAGNOSIS — J8481 Lymphangioleiomyomatosis: Secondary | ICD-10-CM

## 2024-07-26 ENCOUNTER — Inpatient Hospital Stay: Admitting: Family Medicine

## 2024-07-27 NOTE — Telephone Encounter (Signed)
 Refill sent for SIROLIMUS  to [facility]: [phone]  Indication: lymphangioleiomyomatosis (LAM)  Dose: 2mg  once daily   Last OV: 04/27/24 Provider: Dr. Shelah Pertinent labs:  - Sirolimus  level 3.1 ng/mL (04/21/24) - historically, sirolimus  levels subtherapeutic due to nonadherence. This level was obtained about 14 days after restarting therapy and taking consistently. Since then, fill history shows consistent use. Recommend updating sirolimus  level.  - Target sirolimus  range for LAM: 5 to 15 ng/mL   Monitor serum trough concentration 10 to 20 days after initiating therapy for lymphangioleiomyomatosis and 7 to 14 days after dosage adjustments. Once a stable dose is achieved, trough concentrations should be assessed at least every 3 months.   Next OV: due January 2026, not yet scheduled   Routing to scheduling team for follow-up on appt scheduling  Aleck Puls, PharmD, BCPS Clinical Pharmacist  [facility]

## 2024-08-03 ENCOUNTER — Ambulatory Visit: Payer: Self-pay

## 2024-08-03 ENCOUNTER — Ambulatory Visit
Admission: EM | Admit: 2024-08-03 | Discharge: 2024-08-03 | Disposition: A | Discharge status: Home / Self Care | Attending: Family Medicine | Admitting: Family Medicine

## 2024-08-03 DIAGNOSIS — J029 Acute pharyngitis, unspecified: Secondary | ICD-10-CM

## 2024-08-03 DIAGNOSIS — R070 Pain in throat: Secondary | ICD-10-CM

## 2024-08-03 LAB — POCT RAPID STREP A (OFFICE): Rapid Strep A Screen: NEGATIVE

## 2024-08-03 MED ORDER — PSEUDOEPHEDRINE HCL 30 MG PO TABS: 30.0000 mg | ORAL_TABLET | Freq: Three times a day (TID) | ORAL | 0 refills | Status: AC | PRN

## 2024-08-03 MED ORDER — CETIRIZINE HCL 10 MG PO TABS: 10.0000 mg | ORAL_TABLET | Freq: Every day | ORAL | 0 refills | Status: AC

## 2024-08-03 NOTE — ED Triage Notes (Signed)
 Pt states that she has a sore throat. X2 days  Pt denies any other symptoms. Pt states that she has been taking Nyquil.

## 2024-08-03 NOTE — Telephone Encounter (Signed)
Going to [facility]

## 2024-08-03 NOTE — ED Provider Notes (Signed)
 " [facility], Television/film/video - [facility]  Note:  This document was prepared using Conservation officer, historic buildings and may include unintentional dictation errors.  MRN: [id number] DOB: [birth year]  Subjective:   Brenda Williams is a 44 y.o. female presenting for 2 day history of throat pain, painful swallowing, has a slight runny nose. No fever, stuffy nose, cough, chest pain, ear pain, rashes. No sick contacts.   Current Outpatient Medications  Medication Instructions   albuterol  (VENTOLIN  HFA) 108 (90 Base) MCG/ACT inhaler 2 puffs, Inhalation, Every 4 hours PRN   albuterol  (VENTOLIN  HFA) 108 (90 Base) MCG/ACT inhaler 2 puffs, Inhalation, Every 6 hours PRN   cyclobenzaprine  (FLEXERIL ) 10 mg, Oral, 2 times daily PRN   hydrocortisone  (ANUSOL -HC) 25 mg, Rectal, 2 times daily   hydrocortisone  (PROCTO-MED HC ) 2.5 % rectal cream 1 Application, Rectal, 2 times daily   melatonin 2 mg, At bedtime PRN   Multiple Vitamins-Minerals (MULTIVITAMIN WITH MINERALS) tablet 1 tablet, Daily   sirolimus  (RAPAMUNE ) 2 mg, Oral, Daily, **Please call [phone] to schedule a lab appointment.**   sucralfate  (CARAFATE ) 1 g, Oral, 2 times daily   Tiotropium Bromide-Olodaterol (STIOLTO RESPIMAT ) 2.5-2.5 MCG/ACT AERS 2 puffs, Inhalation, Daily    Allergies[1]  Past Medical History:  Diagnosis Date   Abnormal Pap smear 2006   leep   Anxiety    Arthritis    osteoarthritis   Asthma    Complication of anesthesia    2014 d+c [facility], HAD ??  SEIZURE/ SHAKING   Cough    Depression    Fatigue    GERD (gastroesophageal reflux disease)    Herpes    never had outbreak. pos per blood, doesn't know which type   Lactose intolerance    Lymphangiomatosis    Polycystic ovarian syndrome    Prediabetes    Sleep apnea    uses CPAP   SVD (spontaneous vaginal delivery)    x 1   Varicose vein of leg    Vitamin D  deficiency      Past Surgical History:  Procedure Laterality Date   DILATION AND EVACUATION  N/A 09/14/2012   Procedure: DILATATION AND EVACUATION;  Surgeon: Lynwood FORBES Curlene PONCE, MD;  Location: [facility];  Service: Gynecology;  Laterality: N/A;   HYSTEROSCOPY  2009   uterine polyp   IVF Retrival  2010, 2011   KNEE SURGERY  1998   right knee   LEEP  2006   LIPOSUCTION  2021   LUNG BIOPSY Right 12/28/2015   Procedure: RIGHT LUNG BIOPSY;  Surgeon: Maude Fleeta Ochoa, MD;  Location: [facility] OR;  Service: Thoracic;  Laterality: Right;   ROUX-EN-Y GASTRIC BYPASS  06/2021   SPHINCTEROTOMY  2010   tummy tuck  11/12/2019   uterine polyp removal     VIDEO ASSISTED THORACOSCOPY Right 12/28/2015   Procedure: RIGHT VIDEO ASSISTED THORACOSCOPY;  Surgeon: Maude Fleeta Ochoa, MD;  Location: [facility] OR;  Service: Thoracic;  Laterality: Right;    Family History  Problem Relation Age of Onset   Diabetes Maternal Grandmother    Heart disease Maternal Grandmother    Hypertension Maternal Grandfather    Cancer Maternal Grandfather 76       bone, colon    Diabetes Maternal Grandfather    Colon cancer Maternal Grandfather    Diabetes Mother    Cancer Mother 33       breast cancer   Hyperlipidemia Mother    Depression Mother    Obesity Mother    Hypertension  Father    Hyperlipidemia Father    Thyroid  disease Father    Alcohol abuse Father    Drug abuse Father    Obesity Father    Anesthesia problems Neg Hx    Colon polyps Neg Hx    Esophageal cancer Neg Hx    Stomach cancer Neg Hx     Social History   Occupational History   Occupation: therapist, occupational  Tobacco Use   Smoking status: Never   Smokeless tobacco: Never  Vaping Use   Vaping status: Never Used  Substance and Sexual Activity   Alcohol use: Not Currently    Alcohol/week: 0.0 standard drinks of alcohol   Drug use: No   Sexual activity: Yes    Birth control/protection: None     ROS   Objective:   Vitals: BP (!) 162/97 (BP Location: Right Arm)   Pulse 68   Temp 97.9 F (36.6 C) (Oral)   Resp 18   Ht 5' 7 (1.702 m)    Wt 180 lb (81.6 kg)   LMP 06/18/2024   SpO2 95%   BMI 28.19 kg/m   Physical Exam Constitutional:      General: She is not in acute distress.    Appearance: Normal appearance. She is well-developed. She is not ill-appearing, toxic-appearing or diaphoretic.  HENT:     Head: Normocephalic and atraumatic.     Nose: Nose normal.     Mouth/Throat:     Mouth: Mucous membranes are moist.     Pharynx: No pharyngeal swelling, oropharyngeal exudate, posterior oropharyngeal erythema or uvula swelling.     Tonsils: No tonsillar exudate or tonsillar abscesses. 0 on the right. 0 on the left.  Eyes:     General: No scleral icterus.       Right eye: No discharge.        Left eye: No discharge.     Extraocular Movements: Extraocular movements intact.  Cardiovascular:     Rate and Rhythm: Normal rate.  Pulmonary:     Effort: Pulmonary effort is normal.  Skin:    General: Skin is warm and dry.  Neurological:     General: No focal deficit present.     Mental Status: She is alert and oriented to person, place, and time.  Psychiatric:        Mood and Affect: Mood normal.        Behavior: Behavior normal.     Results for orders placed or performed during the hospital encounter of 08/03/24 (from the past 24 hours)  POCT rapid strep A     Status: Normal   Collection Time: 08/03/24  9:29 AM  Result Value Ref Range   Rapid Strep A Screen Negative Negative    Assessment and Plan :   PDMP not reviewed this encounter.  1. Acute pharyngitis, unspecified etiology   2. Throat pain      Throat culture pending. Suspect viral URI, viral pharyngitis. Physical exam findings reassuring and vital signs stable for discharge. Advised supportive care, offered symptomatic relief. Counseled patient on potential for adverse effects with medications prescribed/recommended today, ER and return-to-clinic precautions discussed, patient verbalized understanding.      [1]  Allergies Allergen Reactions    Fentanyl  Itching    Benadryl  effective for itching.   Hydromorphone  Itching     Christopher Savannah, [place] 08/03/24 9040  "

## 2024-08-03 NOTE — Telephone Encounter (Signed)
 Patient was offered an appointment this afternoon with her PCP Dr Frann and offered earlier appointments with other providers at surrounding PCP office within the region. Patient wanted an appointment sooner this morning and states that she is going to go to a nearby Urgent Care  FYI Only or Action Required?: FYI only for provider: Patient going to Urgent Care due to immediate availability at PCP office..  Patient was last seen in primary care on 07/22/2024 by Frann Mabel Mt, DO.  Called Nurse Triage reporting Sore Throat.  Symptoms began yesterday.  Interventions attempted: OTC medications: Nyquil and Rest, hydration, or home remedies.  Symptoms are: gradually worsening.  Triage Disposition: See Physician Within 24 Hours  Patient/caregiver understands and will follow disposition?: Yes      Message from Mia F sent at 08/03/2024  8:36 AM EST  Reason for Triage: Swollen thorat that hurt to swallow. Nausous and not able to eat or drink due to pain. No difficulty breathing. Started yesterday and is getting worse.   Reason for Disposition  SEVERE throat pain (e.g., excruciating)  Answer Assessment - Initial Assessment Questions Throat--swollen/painful since yesterday Hoarse voice Some nausea when trying to eat Patient denies fever, difficulty breathing, cough, sneezing, runny nose, rashes, itching Patient states she feels like she may have congestion---feels like her sinuses are puffy Pain with swallowing/eating Patient denies any new medications, new cleaning products, new detergents, new soaps She states she has no trouble breathing  Patient took Nyquil last night Patient denies any chance of pregnancy  Patient was offered an appointment this afternoon with her PCP Dr Frann and offered earlier appointments with other providers at surrounding PCP office within the region. Patient wanted an appointment sooner this morning and states that she is going to go to a  nearby Urgent Care This RN advised her of the two [place] [facility] closest to her home.  Patient is advised to call us  back if anything changes or with any further questions/concerns. Patient is advised that if anything worsens to go to the Emergency Room. Patient verbalized understanding.  Protocols used: Sore Throat-A-AH

## 2024-08-03 NOTE — Telephone Encounter (Signed)
 Attempted to call pot. Left voicemail for pt to call back to get scheduled.

## 2024-08-05 ENCOUNTER — Telehealth: Admitting: Family Medicine

## 2024-08-05 ENCOUNTER — Encounter: Payer: Self-pay | Admitting: Family Medicine

## 2024-08-05 VITALS — BP 116/82 | HR 78 | Temp 97.2°F | Ht 67.0 in | Wt 180.0 lb

## 2024-08-05 DIAGNOSIS — J069 Acute upper respiratory infection, unspecified: Secondary | ICD-10-CM | POA: Diagnosis not present

## 2024-08-05 MED ORDER — AMOXICILLIN-POT CLAVULANATE 875-125 MG PO TABS: 1.0000 | ORAL_TABLET | Freq: Two times a day (BID) | ORAL | 0 refills | Status: AC

## 2024-08-05 NOTE — Progress Notes (Signed)
 Virtual Video Visit via MyChart Note  I connected with  Brenda Williams on 08/05/2024 at 12:40 PM EST by the video enabled telemedicine application for MyChart, and verified that I am speaking with the correct person using two identifiers.   I introduced myself as a Publishing Rights Manager with the practice. We discussed the limitations of evaluation and management by telemedicine and the availability of in person appointments. The patient expressed understanding and agreed to proceed.  Participating parties in this visit include: The patient and the nurse practitioner listed.  The patient is: At home I am: In the office - [facility] at [facility]  Subjective:    CC:  Chief Complaint  Patient presents with   Sore Throat   Cough    HPI: Brenda Williams is a 44 y.o. year old female presenting today via MyChart today for sore throat. Seen in urgent care for sore throat, difficulty swallowing, and runny nose on 08/03/2024. Strep negative. Prescribed Zyrtec  10 mg daily and Pseudoephedrine  20 mg as needed.   Discussed the use of AI scribe software for clinical note transcription with the patient, who gave verbal consent to proceed.  History of Present Illness Brenda Williams is a 44 year old female who presents with worsening upper respiratory symptoms.  She has been experiencing upper respiratory symptoms since Tuesday, initially presenting with a sore throat that has since improved, but now with significant congestion, rhinorrhea, and a mostly productive cough that is occasionally dry. No fever, otalgia, or recent exposure to sick individuals, except for her daughter who was ill a few weeks ago.  She visited urgent care on Wednesday, where a strep throat test was negative. She was advised to take allergy and cold medications, but her symptoms have continued to worsen, prompting her current visit.  She is currently taking Zyrtec , Sudafed, a bariatric vitamin for iron, and  sirolimus  for her lungs. She denies any history of sinus infections or pneumonia and is not allergic to antibiotics.  She is concerned about worsening symptoms. She requests a work note as she was unable to return to work as planned due to feeling worse.  No fever, otalgia, but she reports congestion, rhinorrhea, productive cough, and myalgias.       Past medical history, Surgical history, Family history not pertinant except as noted below, Social history, Allergies, and medications have been entered into the medical record, reviewed, and corrections made.   Review of Systems:  All review of systems negative except what is listed in the HPI   Objective:    General:  Speaking clearly in complete sentences. Absent shortness of breath noted.   Alert and oriented x3.   Normal judgment.  Absent acute distress.   Impression and Recommendations:    Problem List Items Addressed This Visit   None Visit Diagnoses       Upper respiratory tract infection, unspecified type    -  Primary   Relevant Medications   amoxicillin -clavulanate (AUGMENTIN ) 875-125 MG tablet       Assessment & Plan Acute upper respiratory infection Symptoms suggest viral etiology; bacterial infection not ruled out. - Prescribed Augmentin  to start Monday if symptoms worsen or persist. - Advised rest, hydration, and OTC medications: Sudafed, Mucinex , Delsym , saline nasal spray, Flonase , cough syrup, cough drops. - Provided work note for today; advised return to work Monday if improved.      Follow-up if symptoms worsen or fail to improve.    I discussed the  assessment and treatment plan with the patient. The patient was provided an opportunity to ask questions and all were answered. The patient agreed with the plan and demonstrated an understanding of the instructions.   The patient was advised to call back or seek an in-person evaluation if the symptoms worsen or if the condition fails to improve as  anticipated.   I,Emily Lagle,acting as a neurosurgeon for Waddell KATHEE Mon, NP.,have documented all relevant documentation on the behalf of Waddell KATHEE Mon, NP.  I, Waddell KATHEE Mon, NP, have reviewed all documentation for this visit. The documentation on 08/05/2024 for the exam, diagnosis, procedures, and orders are all accurate and complete.

## 2024-08-05 NOTE — Progress Notes (Signed)
 Started Tuesday, saw urgent care on Wednesday Negative strep, did not test anything else  Started cough yesterday   Taking:  Zyrtec   Sudafed   Feeling worse

## 2024-08-22 ENCOUNTER — Other Ambulatory Visit
